# Patient Record
Sex: Female | Born: 1950 | Race: Black or African American | Hispanic: No | Marital: Single | Smoking: Never smoker
Health system: Southern US, Community
[De-identification: ages and names within clinical notes are randomized; demographics above are authoritative.]

## PROBLEM LIST (undated history)

## (undated) DIAGNOSIS — T884XXA Failed or difficult intubation, initial encounter: Secondary | ICD-10-CM

## (undated) DIAGNOSIS — M810 Age-related osteoporosis without current pathological fracture: Secondary | ICD-10-CM

## (undated) DIAGNOSIS — J45909 Unspecified asthma, uncomplicated: Secondary | ICD-10-CM

## (undated) DIAGNOSIS — Z9889 Other specified postprocedural states: Secondary | ICD-10-CM

## (undated) DIAGNOSIS — M1250 Traumatic arthropathy, unspecified site: Secondary | ICD-10-CM

## (undated) DIAGNOSIS — K228 Other specified diseases of esophagus: Secondary | ICD-10-CM

## (undated) DIAGNOSIS — R112 Nausea with vomiting, unspecified: Secondary | ICD-10-CM

## (undated) DIAGNOSIS — L309 Dermatitis, unspecified: Secondary | ICD-10-CM

## (undated) DIAGNOSIS — J449 Chronic obstructive pulmonary disease, unspecified: Secondary | ICD-10-CM

## (undated) DIAGNOSIS — Z973 Presence of spectacles and contact lenses: Secondary | ICD-10-CM

## (undated) DIAGNOSIS — J439 Emphysema, unspecified: Secondary | ICD-10-CM

## (undated) DIAGNOSIS — K449 Diaphragmatic hernia without obstruction or gangrene: Secondary | ICD-10-CM

## (undated) DIAGNOSIS — K219 Gastro-esophageal reflux disease without esophagitis: Secondary | ICD-10-CM

## (undated) DIAGNOSIS — K429 Umbilical hernia without obstruction or gangrene: Secondary | ICD-10-CM

## (undated) DIAGNOSIS — R918 Other nonspecific abnormal finding of lung field: Secondary | ICD-10-CM

## (undated) DIAGNOSIS — F419 Anxiety disorder, unspecified: Secondary | ICD-10-CM

## (undated) HISTORY — PX: COLONOSCOPY: SHX174

## (undated) HISTORY — DX: Gastro-esophageal reflux disease without esophagitis: K21.9

## (undated) HISTORY — DX: Traumatic arthropathy, unspecified site: M12.50

## (undated) HISTORY — PX: COLONOSCOPY WITH ESOPHAGOGASTRODUODENOSCOPY (EGD): SHX5779

## (undated) HISTORY — DX: Other nonspecific abnormal finding of lung field: R91.8

## (undated) HISTORY — DX: Umbilical hernia without obstruction or gangrene: K42.9

## (undated) HISTORY — PX: ABDOMINAL HYSTERECTOMY: SHX81

## (undated) HISTORY — DX: Diaphragmatic hernia without obstruction or gangrene: K44.9

## (undated) HISTORY — DX: Emphysema, unspecified: J43.9

## (undated) HISTORY — PX: OTHER SURGICAL HISTORY: SHX169

## (undated) HISTORY — DX: Dermatitis, unspecified: L30.9

---
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
## 2015-02-27 ENCOUNTER — Emergency Department (HOSPITAL_COMMUNITY): Payer: Non-veteran care

## 2015-02-27 ENCOUNTER — Encounter (HOSPITAL_COMMUNITY): Payer: Self-pay | Admitting: Emergency Medicine

## 2015-02-27 ENCOUNTER — Emergency Department (HOSPITAL_COMMUNITY)
Admission: EM | Admit: 2015-02-27 | Discharge: 2015-02-27 | Disposition: A | Discharge status: Home / Self Care | Payer: Non-veteran care | Attending: Emergency Medicine | Admitting: Emergency Medicine

## 2015-02-27 DIAGNOSIS — J441 Chronic obstructive pulmonary disease with (acute) exacerbation: Secondary | ICD-10-CM | POA: Insufficient documentation

## 2015-02-27 DIAGNOSIS — R0602 Shortness of breath: Secondary | ICD-10-CM | POA: Diagnosis present

## 2015-02-27 HISTORY — DX: Chronic obstructive pulmonary disease, unspecified: J44.9

## 2015-02-27 LAB — CBC
HCT: 41.7 % (ref 36.0–46.0)
HEMOGLOBIN: 14.1 g/dL (ref 12.0–15.0)
MCH: 31.6 pg (ref 26.0–34.0)
MCHC: 33.8 g/dL (ref 30.0–36.0)
MCV: 93.5 fL (ref 78.0–100.0)
PLATELETS: 207 10*3/uL (ref 150–400)
RBC: 4.46 MIL/uL (ref 3.87–5.11)
RDW: 12.2 % (ref 11.5–15.5)
WBC: 4.1 10*3/uL (ref 4.0–10.5)

## 2015-02-27 LAB — BASIC METABOLIC PANEL
ANION GAP: 10 (ref 5–15)
BUN: 8 mg/dL (ref 6–20)
CALCIUM: 9.7 mg/dL (ref 8.9–10.3)
CO2: 28 mmol/L (ref 22–32)
CREATININE: 0.98 mg/dL (ref 0.44–1.00)
Chloride: 105 mmol/L (ref 101–111)
GFR, EST NON AFRICAN AMERICAN: 60 mL/min — AB (ref >60)
Glucose, Bld: 113 mg/dL — ABNORMAL HIGH (ref 65–99)
Potassium: 4.5 mmol/L (ref 3.5–5.1)
SODIUM: 143 mmol/L (ref 135–145)

## 2015-02-27 MED ORDER — ALBUTEROL SULFATE (2.5 MG/3ML) 0.083% IN NEBU
INHALATION_SOLUTION | RESPIRATORY_TRACT | Status: AC
  Administered 2015-02-27: 2.5 mg
  Filled 2015-02-27: qty 6

## 2015-02-27 MED ORDER — IPRATROPIUM BROMIDE 0.02 % IN SOLN
0.5000 mg | Freq: Once | RESPIRATORY_TRACT | Status: AC
  Administered 2015-02-27: 0.5 mg via RESPIRATORY_TRACT
  Filled 2015-02-27: qty 2.5

## 2015-02-27 MED ORDER — PREDNISONE 20 MG PO TABS: 40.0000 mg | ORAL_TABLET | Freq: Every day | ORAL | Status: AC

## 2015-02-27 MED ORDER — IPRATROPIUM-ALBUTEROL 0.5-2.5 (3) MG/3ML IN SOLN
RESPIRATORY_TRACT | Status: AC
  Filled 2015-02-27: qty 3

## 2015-02-27 MED ORDER — ALBUTEROL SULFATE (2.5 MG/3ML) 0.083% IN NEBU: 5.0000 mg | INHALATION_SOLUTION | Freq: Once | RESPIRATORY_TRACT | Status: DC

## 2015-02-27 MED ORDER — AZITHROMYCIN 250 MG PO TABS
500.0000 mg | ORAL_TABLET | Freq: Once | ORAL | Status: DC
  Filled 2015-02-27: qty 2

## 2015-02-27 MED ORDER — PREDNISONE 20 MG PO TABS
50.0000 mg | ORAL_TABLET | Freq: Once | ORAL | Status: DC
  Filled 2015-02-27: qty 3

## 2015-02-27 MED ORDER — AZITHROMYCIN 250 MG PO TABS: 250.0000 mg | ORAL_TABLET | Freq: Every day | ORAL | Status: DC

## 2015-02-27 NOTE — ED Notes (Signed)
Pt ambulated in the hall way. o2 91 heart rate 73

## 2015-02-27 NOTE — Discharge Instructions (Signed)
Chronic Obstructive Pulmonary Disease Chronic obstructive pulmonary disease (COPD) is a common lung condition in which airflow from the lungs is limited. COPD is a general term that can be used to describe many different lung problems that limit airflow, including both chronic bronchitis and emphysema. If you have COPD, your lung function will probably never return to normal, but there are measures you can take to improve lung function and make yourself feel better. CAUSES   Smoking (common).  Exposure to secondhand smoke.  Genetic problems.  Chronic inflammatory lung diseases or recurrent infections. SYMPTOMS  Shortness of breath, especially with physical activity.  Deep, persistent (chronic) cough with a large amount of thick mucus.  Wheezing.  Rapid breaths (tachypnea).  Gray or bluish discoloration (cyanosis) of the skin, especially in your fingers, toes, or lips.  Fatigue.  Weight loss.  Frequent infections or episodes when breathing symptoms become much worse (exacerbations).  Chest tightness. DIAGNOSIS Your health care provider will take a medical history and perform a physical examination to diagnose COPD. Additional tests for COPD may include:  Lung (pulmonary) function tests.  Chest X-ray.  CT scan.  Blood tests. TREATMENT  Treatment for COPD may include:  Inhaler and nebulizer medicines. These help manage the symptoms of COPD and make your breathing more comfortable.  Supplemental oxygen. Supplemental oxygen is only helpful if you have a low oxygen level in your blood.  Exercise and physical activity. These are beneficial for nearly all people with COPD.  Lung surgery or transplant.  Nutrition therapy to gain weight, if you are underweight.  Pulmonary rehabilitation. This may involve working with a team of health care providers and specialists, such as respiratory, occupational, and physical therapists. HOME CARE INSTRUCTIONS  Take all medicines  (inhaled or pills) as directed by your health care provider.  Avoid over-the-counter medicines or cough syrups that dry up your airway (such as antihistamines) and slow down the elimination of secretions unless instructed otherwise by your health care provider.  If you are a smoker, the most important thing that you can do is stop smoking. Continuing to smoke will cause further lung damage and breathing trouble. Ask your health care provider for help with quitting smoking. He or she can direct you to community resources or hospitals that provide support.  Avoid exposure to irritants such as smoke, chemicals, and fumes that aggravate your breathing.  Use oxygen therapy and pulmonary rehabilitation if directed by your health care provider. If you require home oxygen therapy, ask your health care provider whether you should purchase a pulse oximeter to measure your oxygen level at home.  Avoid contact with individuals who have a contagious illness.  Avoid extreme temperature and humidity changes.  Eat healthy foods. Eating smaller, more frequent meals and resting before meals may help you maintain your strength.  Stay active, but balance activity with periods of rest. Exercise and physical activity will help you maintain your ability to do things you want to do.  Preventing infection and hospitalization is very important when you have COPD. Make sure to receive all the vaccines your health care provider recommends, especially the pneumococcal and influenza vaccines. Ask your health care provider whether you need a pneumonia vaccine.  Learn and use relaxation techniques to manage stress.  Learn and use controlled breathing techniques as directed by your health care provider. Controlled breathing techniques include:  Pursed lip breathing. Start by breathing in (inhaling) through your nose for 1 second. Then, purse your lips as if you were   going to whistle and breathe out (exhale) through the  pursed lips for 2 seconds.  Diaphragmatic breathing. Start by putting one hand on your abdomen just above your waist. Inhale slowly through your nose. The hand on your abdomen should move out. Then purse your lips and exhale slowly. You should be able to feel the hand on your abdomen moving in as you exhale.  Learn and use controlled coughing to clear mucus from your lungs. Controlled coughing is a series of short, progressive coughs. The steps of controlled coughing are: 1. Lean your head slightly forward. 2. Breathe in deeply using diaphragmatic breathing. 3. Try to hold your breath for 3 seconds. 4. Keep your mouth slightly open while coughing twice. 5. Spit any mucus out into a tissue. 6. Rest and repeat the steps once or twice as needed. SEEK MEDICAL CARE IF:  You are coughing up more mucus than usual.  There is a change in the color or thickness of your mucus.  Your breathing is more labored than usual.  Your breathing is faster than usual. SEEK IMMEDIATE MEDICAL CARE IF:  You have shortness of breath while you are resting.  You have shortness of breath that prevents you from:  Being able to talk.  Performing your usual physical activities.  You have chest pain lasting longer than 5 minutes.  Your skin color is more cyanotic than usual.  You measure low oxygen saturations for longer than 5 minutes with a pulse oximeter. MAKE SURE YOU:  Understand these instructions.  Will watch your condition.  Will get help right away if you are not doing well or get worse.   This information is not intended to replace advice given to you by your health care provider. Make sure you discuss any questions you have with your health care provider.   Document Released: 11/26/2004 Document Revised: 03/09/2014 Document Reviewed: 10/13/2012 Elsevier Interactive Patient Education 2016 [employer]  

## 2015-02-27 NOTE — ED Notes (Signed)
C/o shortness of breath for about a week-- increasing today- has been coughing up yellowish thick sputum, wearing 02 at night--- pt normally goes to [facility].

## 2015-02-27 NOTE — ED Provider Notes (Signed)
CSN: [id number]     Arrival date & time 02/27/15  1226 History   First MD Initiated Contact with Patient 02/27/15 1725     Chief Complaint  Patient presents with  . Shortness of Breath   64 yo F w/PMH of COPD who presents w/worsening SOB x 1 week and increasing sputum production, yellow. Tried her home inhalers w/no help. SOB worsened last night and this morning and pt had to use her home O2 (which she rarely needs; only used 2L).  She denies CP, hemoptysis, fever, chills, N/V, diarrhea, constipation, hematemesis, dysuria, hematuria, sick contacts, or recent travel.   (Consider location/radiation/quality/duration/timing/severity/associated sxs/prior Treatment) Patient is a 64 y.o. female presenting with shortness of breath.  Shortness of Breath Severity:  Severe Onset quality:  Sudden Timing:  Constant Progression:  Worsening Chronicity:  Recurrent Context: weather changes   Relieved by:  Nothing Ineffective treatments:  Inhaler Associated symptoms: cough and sputum production   Associated symptoms: no abdominal pain, no chest pain, no fever, no headaches, no sore throat and no vomiting     Past Medical History  Diagnosis Date  . COPD (chronic obstructive pulmonary disease) ([place])     pt states due to [place] contracted COPD   Past Surgical History  Procedure Laterality Date  . Bunionectomy    . Abdominal hysterectomy     No family history on file. Social History  Substance Use Topics  . Smoking status: Never Smoker   . Smokeless tobacco: None  . Alcohol Use: No   OB History    No data available     Review of Systems  Constitutional: Negative for fever and chills.  HENT: Negative for congestion and sore throat.   Respiratory: Positive for cough, sputum production and shortness of breath.   Cardiovascular: Negative for chest pain, palpitations and leg swelling.  Gastrointestinal: Negative for nausea, vomiting, abdominal pain, diarrhea, constipation and  abdominal distention.  Genitourinary: Negative for dysuria, frequency, flank pain and decreased urine volume.  Neurological: Negative for dizziness, speech difficulty, light-headedness and headaches.  All other systems reviewed and are negative.     Allergies  Flonase  Home Medications   Prior to Admission medications   Medication Sig Start Date End Date Taking? Authorizing Provider  azithromycin (ZITHROMAX) 250 MG tablet Take 1 tablet (250 mg total) by mouth daily. Take 1 tablet daily for 4 days beginning Thursday. 02/27/15   Sherian Maroon, MD  predniSONE (DELTASONE) 20 MG tablet Take 2 tablets (40 mg total) by mouth daily with breakfast. 02/27/15 03/03/15  Sherian Maroon, MD   BP 117/79 mmHg  Pulse 83  Temp(Src) 99 F (37.2 C) (Oral)  Resp 18  SpO2 94% Physical Exam  Constitutional: She is oriented to person, place, and time. She appears well-developed and well-nourished. No distress.  HENT:  Head: Normocephalic and atraumatic.  Eyes: Pupils are equal, round, and reactive to light.  Neck: Normal range of motion.  Cardiovascular: Normal rate, regular rhythm, normal heart sounds and intact distal pulses.  Exam reveals no gallop and no friction rub.   No murmur heard. Pulmonary/Chest: Effort normal. No respiratory distress. She has wheezes (mild). She has no rales. She exhibits no tenderness.  Increased expiratory phase  Abdominal: Soft. Bowel sounds are normal. She exhibits no distension and no mass. There is no tenderness. There is no rebound and no guarding.  Lymphadenopathy:    She has no cervical adenopathy.  Neurological: She is alert and oriented to person, place, and time.  Skin: Skin is warm and dry. She is not diaphoretic.  Nursing note and vitals reviewed.   ED Course  Procedures (including critical care time) Labs Review Labs Reviewed  BASIC METABOLIC PANEL - Abnormal; Notable for the following:    Glucose, Bld 113 (*)    GFR calc non Af Amer 60 (*)    All other  components within normal limits  CBC    Imaging Review Dg Chest 2 View  02/27/2015  CLINICAL DATA:  Shortness of breath for 1 week EXAM: CHEST - 2 VIEW COMPARISON:  None. FINDINGS: Cardiac shadow is within normal limits. The lungs are hyperinflated consistent with COPD. No focal infiltrate or sizable effusion is noted. No bony abnormality is seen. IMPRESSION: COPD without acute abnormality. Electronically Signed   By: Inez Catalina M.D.   On: 02/27/2015 13:38   I have personally reviewed and evaluated these images and lab results as part of my medical decision-making.   EKG Interpretation None      MDM   Final diagnoses:  COPD exacerbation ([place])   64 yo F w/PMH of COPD who presents w/COPD exacerbation. See HPI for details. On exam, NAD, AFVSS. No resp distress, speaking in full sentences easily. Her discomfort and SOB has improved after duoneb treatment in triage. Nothing on exam or hx to indicate PE, CAD, PTX, PNA. Will treat w/steroids and Zpack. DC home w/FU to PCP. STrict return if worsening sx.   Pt was seen under the supervision of Dr. Oleta Mouse.     Sherian Maroon, MD 02/27/15 [place] Liu, MD 02/28/15 [phone]

## 2015-02-27 NOTE — ED Notes (Signed)
Pt reporting she needs to leave, attempted to administer medications to pt however she refused stating "I cannot take this medication at one time." Pt adamant she could not take the amount of medication. Explained to the patient that these medications were for acute treatment and different than regular dose. Pt still refuses. Pt discharged with paperwork and education about prescriptions.

## 2015-04-19 ENCOUNTER — Ambulatory Visit (INDEPENDENT_AMBULATORY_CARE_PROVIDER_SITE_OTHER): Payer: No Typology Code available for payment source | Admitting: Internal Medicine

## 2015-04-19 ENCOUNTER — Encounter: Payer: Self-pay | Admitting: Internal Medicine

## 2015-04-19 VITALS — BP 114/60 | HR 70 | Ht 66.0 in | Wt 135.0 lb

## 2015-04-19 DIAGNOSIS — J449 Chronic obstructive pulmonary disease, unspecified: Secondary | ICD-10-CM

## 2015-04-19 DIAGNOSIS — F341 Dysthymic disorder: Secondary | ICD-10-CM

## 2015-04-19 DIAGNOSIS — J441 Chronic obstructive pulmonary disease with (acute) exacerbation: Secondary | ICD-10-CM | POA: Insufficient documentation

## 2015-04-19 DIAGNOSIS — R4589 Other symptoms and signs involving emotional state: Secondary | ICD-10-CM | POA: Insufficient documentation

## 2015-04-19 MED ORDER — BUDESONIDE 180 MCG/ACT IN AEPB: 2.0000 | INHALATION_SPRAY | Freq: Two times a day (BID) | RESPIRATORY_TRACT | Status: DC

## 2015-04-19 NOTE — Patient Instructions (Addendum)
ICD-9-CM ICD-10-CM   1. Chronic obstructive pulmonary disease, unspecified COPD type ([place]) 496 J44.9   2. Feeling of sadness 300.4 F34.1    #sadness  - Do not think your sadness is related to your inhalers  #COPD - Currently disease is free of flareup Plan chagne asmanex to pulmicort 2 puff twice daily   - will list asmanex as causing headache Continue stiolto daily Keep up pulmonary function test appointment with Dr. Gwenette Greet at the [facility] on 04/24/15  - Please have them fax [place] the results [phone] Refer pulmonary rehabilitation at [facility] Please consider getting pneumonia vaccine today 04/19/2015  #Follow-up - 3 months or sooner if needed

## 2015-04-19 NOTE — Progress Notes (Signed)
Subjective:    Patient ID: Patricia Johnston, female    DOB: 06-Feb-1951, 65 y.o.   MRN: [id number] PCP No PCP Per Patient  HPI  IOV 04/19/2015  Chief Complaint  Patient presents with  . Pulmonary Consult    Pt referred by [facility] for COPD. Pt c/o DOE x several years & c/o prod cough with yellow mucus x 2 weeks. Pt c/o chest tightness when SOB.    65 year old African-American female former Counselling psychologist in the [employer]. Reports no smoking history and previously well till the year 2000 when she was sent to a camp in [place] which was condemned. There she spent 4 nights. The environment was extremely dirty with standing water and mold and significant dust and musty smell. They're asked to clean the carpets and the area. She said that the condition a cold air was extremely foul-smelling and was blowing directly on her. Within a few hours or immediately she started coughing. Symptoms got worse over the next few days. She was then placed in a hotel. Subsequently when she returned to her home in [place] she was diagnosed with asthma. Since then she's been on asthma management. She says that she has had approximately 10 pulmonary function test since then and her numbers range less than 70%. She never normalizes her numbers with treatment. She thinks she's been on many prednisone courses possibly. Then in the year 2010 diagnosis was changed to COPD. A few months ago she relocated to [place] [facility] to live a retired life with her sister. She has now registered herself at the [facility] in [place] with Dr. Cy Blamer use to be pulmonary physician here in this practice] she is due for a pulmonary function test 04/24/2015. She is now here because she wants to be referred to pulmonary rehabilitation. She reports that the [facility] cannot refer her directly. She wants to keep cool pulmonologist 1 inside the [place] in one outside the [place].  It appears she is on triple inhaler therapy with Asmanex and Stiolto given to her by Dr.  Lupita Leash. She feels Asmanex is causing a headache. She's been on this for 2 weeks. In addition she is reporting sadness after starting the inhalers. She thinks inhalers are causing her sadness. But is no suicidal tendency.  COP CAT Score 38 and HIGH symptom burden   Chest x-ray 02/19/2015: Hyperinflated without any abnormalities. I personally visualized this chest x-ray  Blood work 02/19/2015 normal creat 0.98 mg percent and hemoglobin 14.1 g percent.    CAT COPD Symptom & Quality of Life Score (GS[employer] trademark) 0 is no burden. 5 is highest burden 04/19/2015   Never Cough -> Cough all the time 5  No phlegm in chest -> Chest is full of phlegm 5  No chest tightness -> Chest feels very tight 4  No dyspnea for 1 flight stairs/hill -> Very dyspneic for 1 flight of stairs 5  No limitations for ADL at home -> Very limited with ADL at home 5  Confident leaving home -> Not at all confident leaving home 5  Sleep soundly -> Do not sleep soundly because of lung condition 5  Lots of Energy -> No energy at all 4  TOTAL Score (max 40)  38       has a past medical history of COPD (chronic obstructive pulmonary disease) ([place]).   reports that she has never smoked. She has never used smokeless tobacco.  Past Surgical History  Procedure Laterality Date  . Bunionectomy    .  Abdominal hysterectomy      Allergies  Allergen Reactions  . Flonase [Fluticasone Propionate] Other (See Comments)    Nasal bleeding  . Nasacort [Triamcinolone] Other (See Comments)    Nasal bleeding    Immunization History  Administered Date(s) Administered  . Influenza,inj,Quad PF,36+ Mos 11/01/2014    No family history on file.   Current outpatient prescriptions:  .  albuterol (PROVENTIL HFA;VENTOLIN HFA) 108 (90 Base) MCG/ACT inhaler, Inhale 1-2 puffs into the lungs every 6 (six) hours as needed for wheezing or shortness of breath., Disp: , Rfl:  .  ipratropium-albuterol (DUONEB) 0.5-2.5 (3) MG/3ML SOLN, Take 3 mLs by  nebulization every 6 (six) hours as needed., Disp: , Rfl:  .  mometasone (ASMANEX) 220 MCG/INH inhaler, Inhale 2 puffs into the lungs daily., Disp: , Rfl:  .  OXYGEN, Inhale 2 L into the lungs daily as needed (for exertion). Reported on 04/19/2015, Disp: , Rfl:  .  Tiotropium Bromide-Olodaterol (STIOLTO RESPIMAT) 2.5-2.5 MCG/ACT AERS, Inhale 2 puffs into the lungs daily., Disp: , Rfl:       Review of Systems  Constitutional: Negative for fever and unexpected weight change.  HENT: Negative for congestion, dental problem, ear pain, nosebleeds, postnasal drip, rhinorrhea, sinus pressure, sneezing, sore throat and trouble swallowing.   Eyes: Negative for redness and itching.  Respiratory: Positive for cough and shortness of breath. Negative for chest tightness and wheezing.   Cardiovascular: Negative for palpitations and leg swelling.  Gastrointestinal: Negative for nausea and vomiting.  Genitourinary: Negative for dysuria.  Musculoskeletal: Negative for joint swelling.  Skin: Negative for rash.  Neurological: Negative for headaches.  Hematological: Does not bruise/bleed easily.  Psychiatric/Behavioral: Negative for dysphoric mood. The patient is not nervous/anxious.        Objective:   Physical Exam  Constitutional: She is oriented to person, place, and time. She appears well-developed and well-nourished. No distress.  HENT:  Head: Normocephalic and atraumatic.  Right Ear: External ear normal.  Left Ear: External ear normal.  Mouth/Throat: Oropharynx is clear and moist. No oropharyngeal exudate.  Eyes: Conjunctivae and EOM are normal. Pupils are equal, round, and reactive to light. Right eye exhibits no discharge. Left eye exhibits no discharge. No scleral icterus.  Neck: Normal range of motion. Neck supple. No JVD present. No tracheal deviation present. No thyromegaly present.  Cardiovascular: Normal rate, regular rhythm, normal heart sounds and intact distal pulses.  Exam reveals no  gallop and no friction rub.   No murmur heard. Pulmonary/Chest: Effort normal and breath sounds normal. No respiratory distress. She has no wheezes. She has no rales. She exhibits no tenderness.  Overall dimiished air entry  Abdominal: Soft. Bowel sounds are normal. She exhibits no distension and no mass. There is no tenderness. There is no rebound and no guarding.  Musculoskeletal: Normal range of motion. She exhibits no edema or tenderness.  Lymphadenopathy:    She has no cervical adenopathy.  Neurological: She is alert and oriented to person, place, and time. She has normal reflexes. No cranial nerve deficit. She exhibits normal muscle tone. Coordination normal.  Skin: Skin is warm and dry. No rash noted. She is not diaphoretic. No erythema. No pallor.  Psychiatric: Judgment and thought content normal.  Flat affct  Vitals reviewed.   Filed Vitals:   04/19/15 1126  BP: 114/60  Pulse: 70  Height: 5\' 6"  (1.676 m)  Weight: 135 lb (61.236 kg)  SpO2: 91%         Assessment & Plan:  ICD-9-CM ICD-10-CM   1. Chronic obstructive pulmonary disease, unspecified COPD type ([place]) 496 J44.9   2. Feeling of sadness 300.4 F34.1      #sadness  - Do not think your sadness is related to your inhalers  #COPD - Currently disease is free of flareup Plan chagne asmanex to pulmicort 2 puff twice daily   - will list asmanex as causing headache Continue stiolto daily Keep up pulmonary function test appointment with Dr. Gwenette Greet at the [facility] on 04/24/15  - Please have them fax [place] the results [phone] Refer pulmonary rehabilitation at [facility] Please consider getting pneumonia vaccine today 04/19/2015  #Follow-up - 3 months or sooner if needed   Dr. Brand Males, M.D., [facility].C.P Pulmonary and Critical Care Medicine Staff Physician [facility] Pager: [phone], If no answer or between  15:00h - 7:00h: call [phone]   [phone]  04/19/2015 12:04 PM

## 2015-04-24 LAB — PULMONARY FUNCTION TEST

## 2015-07-18 ENCOUNTER — Ambulatory Visit (INDEPENDENT_AMBULATORY_CARE_PROVIDER_SITE_OTHER): Payer: No Typology Code available for payment source | Admitting: Internal Medicine

## 2015-07-18 ENCOUNTER — Encounter: Payer: Self-pay | Admitting: Internal Medicine

## 2015-07-18 VITALS — BP 108/62 | HR 61 | Ht 66.0 in | Wt 128.0 lb

## 2015-07-18 DIAGNOSIS — J449 Chronic obstructive pulmonary disease, unspecified: Secondary | ICD-10-CM | POA: Diagnosis not present

## 2015-07-18 MED ORDER — DOXYCYCLINE HYCLATE 100 MG PO TABS: 100.0000 mg | ORAL_TABLET | Freq: Two times a day (BID) | ORAL | Status: AC

## 2015-07-18 NOTE — Progress Notes (Signed)
Subjective:     Patient ID: Patricia Johnston, female   DOB: [birth year], 65 y.o.   MRN: [id number]  HPI PCP No PCP Per Patient  HPI  IOV 04/19/2015  Chief Complaint  Patient presents with  . Pulmonary Consult    Pt referred by [facility] for COPD. Pt c/o DOE x several years & c/o prod cough with yellow mucus x 2 weeks. Pt c/o chest tightness when SOB.    65 year old African-American female former Counselling psychologist in the [employer]. Reports no smoking history and previously well till the year 2000 when she was sent to a camp in [place] which was condemned. There she spent 4 nights. The environment was extremely dirty with standing water and mold and significant dust and musty smell. They're asked to clean the carpets and the area. She said that the condition a cold air was extremely foul-smelling and was blowing directly on her. Within a few hours or immediately she started coughing. Symptoms got worse over the next few days. She was then placed in a hotel. Subsequently when she returned to her home in [place] she was diagnosed with asthma. Since then she's been on asthma management. She says that she has had approximately 10 pulmonary function test since then and her numbers range less than 70%. She never normalizes her numbers with treatment. She thinks she's been on many prednisone courses possibly. Then in the year 2010 diagnosis was changed to COPD. A few months ago she relocated to [facility] to live a retired life with her sister. She has now registered herself at the [facility] in [place] with Dr. Cy Blamer use to be pulmonary physician here in this practice] she is due for a pulmonary function test 04/24/2015. She is now here because she wants to be referred to pulmonary rehabilitation. She reports that the [facility] cannot refer her directly. She wants to keep cool pulmonologist 1 inside the [place] in one outside the [place].  It appears she is on triple inhaler therapy with Asmanex and Stiolto given to her by  Dr. Lupita Leash. She feels Asmanex is causing a headache. She's been on this for 2 weeks. In addition she is reporting sadness after starting the inhalers. She thinks inhalers are causing her sadness. But is no suicidal tendency.  COP CAT Score 38 and HIGH symptom burden   Chest x-ray 02/19/2015: Hyperinflated without any abnormalities. I personally visualized this chest x-ray  Blood work 02/19/2015 normal creat 0.98 mg percent and hemoglobin 14.1 g percent.   OV  07/18/2015  Chief Complaint  Patient presents with  . Follow-up    Pt c/o weight loss >7lbs in 3 mos. Pt c/o some wheeze and SOB over the past few days. She has started prednisone and has been taking Mucinex. Pt denies cough/CP/tightness. Pt never got referral to [facility].    COPD follow-up in this vet  Last visit was in February 2017. After that she went to the [facility] in [place] and apparently she had PFT that showed 50% lung function. She apparently had a CT chest that was clear. She has a 7 pound unexplained weight loss despite good appetite. She is going to talk to this about with primary care physician. She is yet to start pulmonary rehabilitation and she is frustrated that we have not coordinated with the [facility] in ensuring pulmonary rehabilitation Center. We do not have the PFT records from the [facility] as of yet. Otherwise for the last 3 days she's having increased yellow phlegm but no  increase in wheezing or shortness of breath. She is taking Mucinex and this is helping. Yellow phlegm is new. It is mild amount there is no associated fever or worsening cough.    CAT COPD Symptom & Quality of Life Score ([employer] trademark) 0 is no burden. 5 is highest burden 04/19/2015   Never Cough -> Cough all the time 5  No phlegm in chest -> Chest is full of phlegm 5  No chest tightness -> Chest feels very tight 4  No dyspnea for 1 flight stairs/hill -> Very dyspneic for 1 flight of stairs 5  No limitations for ADL at home -> Very limited with ADL at  home 5  Confident leaving home -> Not at all confident leaving home 5  Sleep soundly -> Do not sleep soundly because of lung condition 5  Lots of Energy -> No energy at all 4  TOTAL Score (max 40)  38     has a past medical history of COPD (chronic obstructive pulmonary disease) ([place]).   reports that she has never smoked. She has never used smokeless tobacco.  Past Surgical History  Procedure Laterality Date  . Bunionectomy    . Abdominal hysterectomy      Allergies  Allergen Reactions  . Flonase [Fluticasone Propionate] Other (See Comments)    Nasal bleeding  . Nasacort [Triamcinolone] Other (See Comments)    Nasal bleeding    Immunization History  Administered Date(s) Administered  . Influenza,inj,Quad PF,36+ Mos 11/01/2014    No family history on file.   Current outpatient prescriptions:  .  albuterol (PROVENTIL HFA;VENTOLIN HFA) 108 (90 Base) MCG/ACT inhaler, Inhale 1-2 puffs into the lungs every 6 (six) hours as needed for wheezing or shortness of breath., Disp: , Rfl:  .  Dextromethorphan-Guaifenesin 5-100 MG/5ML LIQD, Take 5 mLs by mouth every 4 (four) hours as needed., Disp: , Rfl:  .  ipratropium-albuterol (DUONEB) 0.5-2.5 (3) MG/3ML SOLN, Take 3 mLs by nebulization every 6 (six) hours as needed., Disp: , Rfl:  .  mometasone (ASMANEX) 220 MCG/INH inhaler, Inhale 2 puffs into the lungs daily., Disp: , Rfl:  .  OXYGEN, Inhale 2 L into the lungs daily as needed (for exertion). Reported on 04/19/2015, Disp: , Rfl:  .  predniSONE (DELTASONE) 10 MG tablet, Take 10 mg by mouth 2 (two) times daily with a meal., Disp: , Rfl:  .  Tiotropium Bromide-Olodaterol (STIOLTO RESPIMAT) 2.5-2.5 MCG/ACT AERS, Inhale 2 puffs into the lungs daily., Disp: , Rfl:       Review of Systems     Objective:   Physical Exam  Constitutional: She is oriented to person, place, and time. She appears well-developed and well-nourished. No distress.  HENT:  Head: Normocephalic and atraumatic.   Right Ear: External ear normal.  Left Ear: External ear normal.  Mouth/Throat: Oropharynx is clear and moist. No oropharyngeal exudate.  Eyes: Conjunctivae and EOM are normal. Pupils are equal, round, and reactive to light. Right eye exhibits no discharge. Left eye exhibits no discharge. No scleral icterus.  Neck: Normal range of motion. Neck supple. No JVD present. No tracheal deviation present. No thyromegaly present.  Cardiovascular: Normal rate, regular rhythm, normal heart sounds and intact distal pulses.  Exam reveals no gallop and no friction rub.   No murmur heard. Pulmonary/Chest: Effort normal and breath sounds normal. No respiratory distress. She has no wheezes. She has no rales. She exhibits no tenderness.  Abdominal: Soft. Bowel sounds are normal. She exhibits no distension and no  mass. There is no tenderness. There is no rebound and no guarding.  Musculoskeletal: Normal range of motion. She exhibits no edema or tenderness.  Lymphadenopathy:    She has no cervical adenopathy.  Neurological: She is alert and oriented to person, place, and time. She has normal reflexes. No cranial nerve deficit. She exhibits normal muscle tone. Coordination normal.  Skin: Skin is warm and dry. No rash noted. She is not diaphoretic. No erythema. No pallor.  Psychiatric: She has a normal mood and affect. Her behavior is normal. Judgment and thought content normal.  Mild flat afect  Vitals reviewed.   Filed Vitals:   07/18/15 1012  BP: 108/62  Pulse: 61  Height: 5\' 6"  (1.676 m)  Weight: 128 lb (58.06 kg)  SpO2: 94%        Assessment:       ICD-9-CM ICD-10-CM   1. Chronic obstructive pulmonary disease, unspecified COPD type ([place]) 496 J44.9        Plan:       #COPD - Currently mild  flareup - unclear reason for weight loss - talk to PCP   Plan Take doxycycline 100mg  po twice daily x 5 days; take after meals and avoid sunlight IF worse call us for prednisone Continue COPD  maintenance treatment as before Refer pulmonary rehab Sign release to get ct chest result and pft result from [facility]   #Follow-up - 3 months or sooner if needed  - COPD CAT score at followup

## 2015-07-18 NOTE — Patient Instructions (Addendum)
ICD-9-CM ICD-10-CM   1. Chronic obstructive pulmonary disease, unspecified COPD type ([place]) 496 J44.9      #COPD - Currently mild  flareup - unclear reason for weight loss - talk to PCP   Plan Take doxycycline 100mg  po twice daily x 5 days; take after meals and avoid sunlight IF worse call us for prednisone Continue COPD maintenance treatment as before Refer pulmonary rehab Sign release to get ct chest result and pft result from [facility]   #Follow-up - 3 months or sooner if needed  - COPD CAT score at followup

## 2015-07-18 NOTE — Addendum Note (Signed)
Addended by: Wynn Banker H on: 07/18/2015 11:01 AM   Modules accepted: Orders

## 2015-08-09 ENCOUNTER — Telehealth: Payer: Self-pay | Admitting: Internal Medicine

## 2015-08-09 NOTE — Telephone Encounter (Signed)
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 He had referred her to allergy. ? ABPA. At [facility] 10/22/15 I will re-review [facility] notes and discuss with her chronic pred +/- itraconazole v nucalan

## 2015-09-27 ENCOUNTER — Encounter (HOSPITAL_COMMUNITY): Payer: Self-pay

## 2015-09-27 ENCOUNTER — Encounter (HOSPITAL_COMMUNITY)
Admission: RE | Admit: 2015-09-27 | Discharge: 2015-09-27 | Disposition: A | Discharge status: Home / Self Care | Payer: No Typology Code available for payment source | Source: Ambulatory Visit | Attending: Pulmonary Disease | Admitting: Pulmonary Disease

## 2015-09-27 VITALS — BP 112/64 | HR 77 | Resp 18 | Ht 65.0 in | Wt 127.9 lb

## 2015-09-27 DIAGNOSIS — J449 Chronic obstructive pulmonary disease, unspecified: Secondary | ICD-10-CM

## 2015-09-27 HISTORY — DX: Age-related osteoporosis without current pathological fracture: M81.0

## 2015-09-27 HISTORY — DX: Unspecified asthma, uncomplicated: J45.909

## 2015-09-27 NOTE — Progress Notes (Signed)
Patricia Johnston 65 y.o. female Pulmonary Rehab Orientation Note Patient arrived today in Cardiac and Pulmonary Rehab for orientation to Pulmonary Rehab. He was transported from [employer] via wheel chair. She does not carry portable oxygen, although it has been prescribed as needed. She does not check her oxygen saturations, but will request pulse ox from [place]. Per pt, she uses oxygen occasionally with her COPD/Asthma flares.The last time she used her oxygen at 2 liters/min was 2 weeks ago with a taper dose of steroids. Color good, skin warm and dry. Patient is oriented to time and place. Patient's medical history, psychosocial health, and medications reviewed. Psychosocial assessment reveals pt lives alone. She was widowed years ago. She received an honorable discharge from the [employer] in 2000. Pt is currently disabled. Pt hobbies include reading, gardening, being active with her church. She also enjoys bowling, but has not done so since she has been sick. Pt reports her stress level is low. She contributes this to her faith in God. Pt does not exhibits signs of depression. PHQ2/9 score 0/NA. Pt shows good  coping skills with positive outlook . She was offered emotional support and reassurance. Will continue to monitor and evaluate progress toward psychosocial goal(s) of remaining positive about her future. Physical assessment reveals heart rate is normal, breath sounds clear to auscultation, no wheezes, rales, or rhonchi. Grip strength equal, strong. Distal pulses palpable, no peripheral edema. Patient reports she does not take medications as prescribed. Stressed importance of using daily inhalers as prescribed and not as needed. Patient states she follows a Regular diet. The patient reports no specific efforts to gain or lose weight. She describes a continuous 2 lb unexplained weight loss monthly. Patient's weight will be monitored closely. Demonstration and practice of PLB using pulse oximeter. Patient able to  return demonstration satisfactorily. Safety and hand hygiene in the exercise area reviewed with patient. Patient voices understanding of the information reviewed. Department expectations discussed with patient and achievable goals were set. The patient shows enthusiasm about attending the program and we look forward to working with this nice lady. The patient is scheduled for a 6 min walk test on Tuesday 10/01/15 and to begin exercise on Tuesday 10/08/15 in the 1:30 class.

## 2015-10-01 ENCOUNTER — Encounter (HOSPITAL_COMMUNITY)
Admission: RE | Admit: 2015-10-01 | Discharge: 2015-10-01 | Disposition: A | Discharge status: Home / Self Care | Payer: Non-veteran care | Source: Ambulatory Visit | Attending: Pulmonary Disease | Admitting: Pulmonary Disease

## 2015-10-01 DIAGNOSIS — Z7951 Long term (current) use of inhaled steroids: Secondary | ICD-10-CM | POA: Insufficient documentation

## 2015-10-01 DIAGNOSIS — J449 Chronic obstructive pulmonary disease, unspecified: Secondary | ICD-10-CM | POA: Insufficient documentation

## 2015-10-01 DIAGNOSIS — M810 Age-related osteoporosis without current pathological fracture: Secondary | ICD-10-CM | POA: Insufficient documentation

## 2015-10-01 DIAGNOSIS — Z79899 Other long term (current) drug therapy: Secondary | ICD-10-CM | POA: Insufficient documentation

## 2015-10-01 NOTE — Progress Notes (Signed)
Pulmonary Individual Treatment Plan  Patient Details  Name: Evalise Delly MRN: [id number] Date of Birth: [birth year] Referring Provider:   April Manson Pulmonary Rehab Walk Test from 10/01/2015 in [place]  Referring Provider  Dr. Nelda Marseille      Initial Encounter Date:  Flowsheet Row Pulmonary Rehab Walk Test from 10/01/2015 in [place]  Date  10/01/15  Referring Provider  Dr. Nelda Marseille      Visit Diagnosis: Chronic obstructive pulmonary disease, unspecified COPD type ([place])  Patient's Home Medications on Admission:   Current Outpatient Prescriptions:  .  albuterol (PROVENTIL HFA;VENTOLIN HFA) 108 (90 Base) MCG/ACT inhaler, Inhale 1-2 puffs into the lungs every 6 (six) hours as needed for wheezing or shortness of breath., Disp: , Rfl:  .  Dextromethorphan-Guaifenesin 5-100 MG/5ML LIQD, Take 5 mLs by mouth every 4 (four) hours as needed., Disp: , Rfl:  .  ipratropium-albuterol (DUONEB) 0.5-2.5 (3) MG/3ML SOLN, Take 3 mLs by nebulization every 6 (six) hours as needed., Disp: , Rfl:  .  mometasone (ASMANEX) 220 MCG/INH inhaler, Inhale 2 puffs into the lungs daily., Disp: , Rfl:  .  OXYGEN, Inhale 2 L into the lungs daily as needed (for exertion). Reported on 04/19/2015, Disp: , Rfl:  .  predniSONE (DELTASONE) 10 MG tablet, Take 10 mg by mouth 2 (two) times daily with a meal. Takes as needed per [place], Disp: , Rfl:  .  Tiotropium Bromide-Olodaterol (STIOLTO RESPIMAT) 2.5-2.5 MCG/ACT AERS, Inhale 2 puffs into the lungs daily., Disp: , Rfl:   Past Medical History: Past Medical History:  Diagnosis Date  . Asthma   . COPD (chronic obstructive pulmonary disease) ([place])    pt states due to [place] contracted COPD  . Osteoporosis     Tobacco Use: History  Smoking Status  . Never Smoker  Smokeless Tobacco  . Never Used    Labs: Recent Review Flowsheet Data    There is no flowsheet data to display.      Capillary  Blood Glucose: No results found for: GLUCAP   ADL UCSD:   Pulmonary Function Assessment:     Pulmonary Function Assessment - 09/27/15 1117      Breath   Bilateral Breath Sounds Clear   Shortness of Breath Yes;Limiting activity;Panic with Shortness of Breath      Exercise Target Goals: Date: 10/01/15  Exercise Program Goal: Individual exercise prescription set with THRR, safety & activity barriers. Participant demonstrates ability to understand and report RPE using BORG scale, to self-measure pulse accurately, and to acknowledge the importance of the exercise prescription.  Exercise Prescription Goal: Starting with aerobic activity 30 plus minutes a day, 3 days per week for initial exercise prescription. Provide home exercise prescription and guidelines that participant acknowledges understanding prior to discharge.  Activity Barriers & Risk Stratification:     Activity Barriers & Cardiac Risk Stratification - 09/27/15 1116      Activity Barriers & Cardiac Risk Stratification   Activity Barriers Deconditioning;Arthritis;Shortness of Breath      6 Minute Walk:     6 Minute Walk    Row Name 10/01/15 1631         6 Minute Walk   Phase Initial     Distance 1460 feet     Walk Time 6 minutes     # of Rest Breaks 0     MPH 2.76     METS 3.07     RPE 11  Perceived Dyspnea  1     Symptoms No     Resting HR 92 bpm     Resting BP 100/62     Max Ex. HR 97 bpm     Max Ex. BP 104/60     2 Minute Post BP 104/60       Interval HR   Baseline HR 92     1 Minute HR 95     2 Minute HR 90     3 Minute HR 97     4 Minute HR 93     5 Minute HR 91     6 Minute HR 93     2 Minute Post HR 88     Interval Heart Rate? Yes       Interval Oxygen   Interval Oxygen? Yes     Baseline Oxygen Saturation % 92 %     Baseline Liters of Oxygen 0 L     1 Minute Oxygen Saturation % 88 %     1 Minute Liters of Oxygen 0 L     2 Minute Oxygen Saturation % 88 %     2 Minute Liters  of Oxygen 0 L     3 Minute Oxygen Saturation % 91 %     3 Minute Liters of Oxygen 0 L     4 Minute Oxygen Saturation % 92 %     4 Minute Liters of Oxygen 0 L     5 Minute Oxygen Saturation % 91 %     5 Minute Liters of Oxygen 0 L     6 Minute Oxygen Saturation % 94 %     6 Minute Liters of Oxygen 0 L     2 Minute Post Oxygen Saturation % 92 %     2 Minute Post Liters of Oxygen 0 L        Initial Exercise Prescription:     Initial Exercise Prescription - 10/01/15 1600      Date of Initial Exercise RX and Referring Provider   Date 10/01/15   Referring Provider Dr. Nelda Marseille     NuStep   Level 2   Minutes 17   METs 1.7     Rower   Level 1   Minutes 17     Track   Laps 8   Minutes 17     Prescription Details   Frequency (times per week) 2   Duration Progress to 45 minutes of aerobic exercise without signs/symptoms of physical distress     Intensity   THRR 40-80% of Max Heartrate 62-124   Ratings of Perceived Exertion 11-13   Perceived Dyspnea 0-4     Progression   Progression Continue progressive overload as per policy without signs/symptoms or physical distress.     Resistance Training   Training Prescription Yes   Weight orange bands   Reps 10-12      Perform Capillary Blood Glucose checks as needed.  Exercise Prescription Changes:   Exercise Comments:   Discharge Exercise Prescription (Final Exercise Prescription Changes):    Nutrition:  Target Goals: Understanding of nutrition guidelines, daily intake of sodium 1500mg , cholesterol 200mg , calories 30% from fat and 7% or less from saturated fats, daily to have 5 or more servings of fruits and vegetables.  Biometrics:     Pre Biometrics - 09/27/15 1118      Pre Biometrics   Grip Strength 39 kg       Nutrition Therapy Plan and  Nutrition Goals:   Nutrition Discharge: Rate Your Plate Scores:   Psychosocial: Target Goals: Acknowledge presence or absence of depression, maximize coping  skills, provide positive support system. Participant is able to verbalize types and ability to use techniques and skills needed for reducing stress and depression.  Initial Review & Psychosocial Screening:     Initial Psych Review & Screening - 09/27/15 [place]? Yes     Barriers   Psychosocial barriers to participate in program There are no identifiable barriers or psychosocial needs.     Screening Interventions   Interventions Encouraged to exercise      Quality of Life Scores:   PHQ-9: Recent Review Flowsheet Data    Depression screen [name] 2/9 09/27/2015   Decreased Interest 0   Down, Depressed, Hopeless 0   PHQ - 2 Score 0      Psychosocial Evaluation and Intervention:     Psychosocial Evaluation - 09/27/15 1122      Psychosocial Evaluation & Interventions   Interventions Encouraged to exercise with the program and follow exercise prescription      Psychosocial Re-Evaluation:  Education: Education Goals: Education classes will be provided on a weekly basis, covering required topics. Participant will state understanding/return demonstration of topics presented.  Learning Barriers/Preferences:     Learning Barriers/Preferences - 09/27/15 1116      Learning Barriers/Preferences   Learning Barriers None   Learning Preferences Written Material;Computer/Internet;Verbal Instruction;Audio      Education Topics: Risk Factor Reduction:  -Group instruction that is supported by a PowerPoint presentation. Instructor discusses the definition of a risk factor, different risk factors for pulmonary disease, and how the heart and lungs work together.     Nutrition for Pulmonary Patient:  -Group instruction provided by PowerPoint slides, verbal discussion, and written materials to support subject matter. The instructor gives an explanation and review of healthy diet recommendations, which includes a discussion on weight management,  recommendations for fruit and vegetable consumption, as well as protein, fluid, caffeine, fiber, sodium, sugar, and alcohol. Tips for eating when patients are short of breath are discussed.   Pursed Lip Breathing:  -Group instruction that is supported by demonstration and informational handouts. Instructor discusses the benefits of pursed lip and diaphragmatic breathing and detailed demonstration on how to preform both.     Oxygen Safety:  -Group instruction provided by PowerPoint, verbal discussion, and written material to support subject matter. There is an overview of "What is Oxygen" and "Why do we need it".  Instructor also reviews how to create a safe environment for oxygen use, the importance of using oxygen as prescribed, and the risks of noncompliance. There is a brief discussion on traveling with oxygen and resources the patient may utilize.   Oxygen Equipment:  -Group instruction provided by [facility] Staff utilizing handouts, written materials, and equipment demonstrations.   Signs and Symptoms:  -Group instruction provided by written material and verbal discussion to support subject matter. Warning signs and symptoms of infection, stroke, and heart attack are reviewed and when to call the physician/911 reinforced. Tips for preventing the spread of infection discussed.   Advanced Directives:  -Group instruction provided by verbal instruction and written material to support subject matter. Instructor reviews Advanced Directive laws and proper instruction for filling out document.   Pulmonary Video:  -Group video education that reviews the importance of medication and oxygen compliance, exercise, good nutrition, pulmonary hygiene, and pursed lip and diaphragmatic  breathing for the pulmonary patient.   Exercise for the Pulmonary Patient:  -Group instruction that is supported by a PowerPoint presentation. Instructor discusses benefits of exercise, core components of exercise,  frequency, duration, and intensity of an exercise routine, importance of utilizing pulse oximetry during exercise, safety while exercising, and options of places to exercise outside of rehab.     Pulmonary Medications:  -Verbally interactive group education provided by instructor with focus on inhaled medications and proper administration.   Anatomy and Physiology of the Respiratory System and Intimacy:  -Group instruction provided by PowerPoint, verbal discussion, and written material to support subject matter. Instructor reviews respiratory cycle and anatomical components of the respiratory system and their functions. Instructor also reviews differences in obstructive and restrictive respiratory diseases with examples of each. Intimacy, Sex, and Sexuality differences are reviewed with a discussion on how relationships can change when diagnosed with pulmonary disease. Common sexual concerns are reviewed.   Knowledge Questionnaire Score:   Core Components/Risk Factors/Patient Goals at Admission:     Personal Goals and Risk Factors at Admission - 09/27/15 1120      Core Components/Risk Factors/Patient Goals on Admission   Increase Strength and Stamina Yes   Intervention Provide advice, education, support and counseling about physical activity/exercise needs.;Develop an individualized exercise prescription for aerobic and resistive training based on initial evaluation findings, risk stratification, comorbidities and participant's personal goals.   Expected Outcomes Achievement of increased cardiorespiratory fitness and enhanced flexibility, muscular endurance and strength shown through measurements of functional capacity and personal statement of participant.   Improve shortness of breath with ADL's Yes   Intervention Provide education, individualized exercise plan and daily activity instruction to help decrease symptoms of SOB with activities of daily living.   Expected Outcomes Short Term:  Achieves a reduction of symptoms when performing activities of daily living.   Develop more efficient breathing techniques such as purse lipped breathing and diaphragmatic breathing; and practicing self-pacing with activity Yes   Intervention Provide education, demonstration and support about specific breathing techniuqes utilized for more efficient breathing. Include techniques such as pursed lipped breathing, diaphragmatic breathing and self-pacing activity.   Expected Outcomes Short Term: Participant will be able to demonstrate and use breathing techniques as needed throughout daily activities.   Increase knowledge of respiratory medications and ability to use respiratory devices properly  Yes   Intervention Provide education and demonstration as needed of appropriate use of medications, inhalers, and oxygen therapy.   Expected Outcomes Short Term: Achieves understanding of medications use. Understands that oxygen is a medication prescribed by physician. Demonstrates appropriate use of inhaler and oxygen therapy.      Core Components/Risk Factors/Patient Goals Review:    Core Components/Risk Factors/Patient Goals at Discharge (Final Review):    ITP Comments:   Comments:

## 2015-10-02 ENCOUNTER — Encounter (HOSPITAL_COMMUNITY): Payer: Self-pay | Admitting: *Deleted

## 2015-10-08 ENCOUNTER — Encounter (HOSPITAL_COMMUNITY)
Admission: RE | Admit: 2015-10-08 | Discharge: 2015-10-08 | Disposition: A | Discharge status: Home / Self Care | Payer: Non-veteran care | Source: Ambulatory Visit | Attending: Pulmonary Disease | Admitting: Pulmonary Disease

## 2015-10-08 VITALS — Wt 125.2 lb

## 2015-10-08 DIAGNOSIS — J449 Chronic obstructive pulmonary disease, unspecified: Secondary | ICD-10-CM

## 2015-10-08 DIAGNOSIS — Z7951 Long term (current) use of inhaled steroids: Secondary | ICD-10-CM | POA: Diagnosis not present

## 2015-10-08 DIAGNOSIS — Z79899 Other long term (current) drug therapy: Secondary | ICD-10-CM | POA: Diagnosis not present

## 2015-10-08 DIAGNOSIS — M810 Age-related osteoporosis without current pathological fracture: Secondary | ICD-10-CM | POA: Diagnosis not present

## 2015-10-08 NOTE — Progress Notes (Signed)
Daily Session Note  Patient Details  Name: Patricia Johnston MRN: [id number] Date of Birth: 1950-08-17 Referring Provider:   April Manson Pulmonary Rehab Walk Test from 10/01/2015 in [place]  Referring Provider  Dr. Nelda Marseille      Encounter Date: 10/08/2015  Check In:     Session Check In - 10/08/15 1337      Check-In   Location [facility]   Staff Present Rosebud Poles, RN, BSN;Sonya Pucci Ysidro Evert, Felipe Drone, RN, MHA;Molly diVincenzo, MS, ACSM RCEP, Exercise Physiologist   Supervising physician immediately available to respond to emergencies [facility] immediately available   Physician(s) Dr. Marily Memos   Medication changes reported     No   Fall or balance concerns reported    No   Warm-up and Cool-down Performed as group-led instruction   Resistance Training Performed Yes   VAD Patient? No     Pain Assessment   Currently in Pain? No/denies   Multiple Pain Sites No      Capillary Blood Glucose: No results found for this or any previous visit (from the past 24 hour(s)).      Exercise Prescription Changes - 10/08/15 1600      Response to Exercise   Blood Pressure (Admit) 104/60   Blood Pressure (Exercise) 98/60   Blood Pressure (Exit) 100/60   Heart Rate (Admit) 79 bpm   Heart Rate (Exercise) 109 bpm   Heart Rate (Exit) 90 bpm   Oxygen Saturation (Admit) 91 %   Oxygen Saturation (Exercise) 89 %   Oxygen Saturation (Exit) 93 %   Rating of Perceived Exertion (Exercise) 13   Perceived Dyspnea (Exercise) 1   Duration Progress to 45 minutes of aerobic exercise without signs/symptoms of physical distress   Intensity THRR unchanged     Progression   Progression Continue to progress workloads to maintain intensity without signs/symptoms of physical distress.     Resistance Training   Training Prescription Yes   Weight orange bands   Reps 10-12  10 minutes of strength training     Interval Training   Interval  Training No     NuStep   Level 2   Minutes 17   METs 2.1     Rower   Level 1   Minutes 17     Track   Laps 20   Minutes 17     Goals Met:  Exercise tolerated well No report of cardiac concerns or symptoms Strength training completed today  Goals Unmet:  Not Applicable  Comments: Service time is from 1330 to 1500    Dr. Rush Farmer is Medical Director for Pulmonary Rehab at [facility].

## 2015-10-10 ENCOUNTER — Encounter (HOSPITAL_COMMUNITY)
Admission: RE | Admit: 2015-10-10 | Discharge: 2015-10-10 | Disposition: A | Discharge status: Home / Self Care | Payer: Non-veteran care | Source: Ambulatory Visit | Attending: Pulmonary Disease | Admitting: Pulmonary Disease

## 2015-10-10 VITALS — Wt 126.5 lb

## 2015-10-10 DIAGNOSIS — J449 Chronic obstructive pulmonary disease, unspecified: Secondary | ICD-10-CM

## 2015-10-10 NOTE — Progress Notes (Signed)
Pulmonary Individual Treatment Plan  Patient Details  Name: Patricia Johnston MRN: [id number] Date of Birth: 02/06/1951 Referring Provider:   April Manson Pulmonary Rehab Walk Test from 10/01/2015 in [place]  Referring Provider  Dr. Nelda Marseille      Initial Encounter Date:  Flowsheet Row Pulmonary Rehab Walk Test from 10/01/2015 in [place]  Date  10/01/15  Referring Provider  Dr. Nelda Marseille      Visit Diagnosis: Chronic obstructive pulmonary disease, unspecified COPD type ([place])  Patient's Home Medications on Admission:   Current Outpatient Prescriptions:  .  albuterol (PROVENTIL HFA;VENTOLIN HFA) 108 (90 Base) MCG/ACT inhaler, Inhale 1-2 puffs into the lungs every 6 (six) hours as needed for wheezing or shortness of breath., Disp: , Rfl:  .  Dextromethorphan-Guaifenesin 5-100 MG/5ML LIQD, Take 5 mLs by mouth every 4 (four) hours as needed., Disp: , Rfl:  .  ipratropium-albuterol (DUONEB) 0.5-2.5 (3) MG/3ML SOLN, Take 3 mLs by nebulization every 6 (six) hours as needed., Disp: , Rfl:  .  mometasone (ASMANEX) 220 MCG/INH inhaler, Inhale 2 puffs into the lungs daily., Disp: , Rfl:  .  OXYGEN, Inhale 2 L into the lungs daily as needed (for exertion). Reported on 04/19/2015, Disp: , Rfl:  .  predniSONE (DELTASONE) 10 MG tablet, Take 10 mg by mouth 2 (two) times daily with a meal. Takes as needed per [place], Disp: , Rfl:  .  Tiotropium Bromide-Olodaterol (STIOLTO RESPIMAT) 2.5-2.5 MCG/ACT AERS, Inhale 2 puffs into the lungs daily., Disp: , Rfl:   Past Medical History: Past Medical History:  Diagnosis Date  . Asthma   . COPD (chronic obstructive pulmonary disease) ([place])    pt states due to [place] contracted COPD  . Osteoporosis     Tobacco Use: History  Smoking Status  . Never Smoker  Smokeless Tobacco  . Never Used    Labs: Recent Review Flowsheet Data    There is no flowsheet data to display.      Capillary  Blood Glucose: No results found for: GLUCAP   ADL UCSD:     Pulmonary Assessment Scores    Row Name 10/02/15 1444         ADL UCSD   ADL Phase Entry     SOB Score total 62        Pulmonary Function Assessment:     Pulmonary Function Assessment - 09/27/15 1117      Breath   Bilateral Breath Sounds Clear   Shortness of Breath Yes;Limiting activity;Panic with Shortness of Breath      Exercise Target Goals:    Exercise Program Goal: Individual exercise prescription set with THRR, safety & activity barriers. Participant demonstrates ability to understand and report RPE using BORG scale, to self-measure pulse accurately, and to acknowledge the importance of the exercise prescription.  Exercise Prescription Goal: Starting with aerobic activity 30 plus minutes a day, 3 days per week for initial exercise prescription. Provide home exercise prescription and guidelines that participant acknowledges understanding prior to discharge.  Activity Barriers & Risk Stratification:     Activity Barriers & Cardiac Risk Stratification - 09/27/15 1116      Activity Barriers & Cardiac Risk Stratification   Activity Barriers Deconditioning;Arthritis;Shortness of Breath      6 Minute Walk:     6 Minute Walk    Row Name 10/01/15 1631         6 Minute Walk   Phase Initial  Distance 1460 feet     Walk Time 6 minutes     # of Rest Breaks 0     MPH 2.76     METS 3.07     RPE 11     Perceived Dyspnea  1     Symptoms No     Resting HR 92 bpm     Resting BP 100/62     Max Ex. HR 97 bpm     Max Ex. BP 104/60     2 Minute Post BP 104/60       Interval HR   Baseline HR 92     1 Minute HR 95     2 Minute HR 90     3 Minute HR 97     4 Minute HR 93     5 Minute HR 91     6 Minute HR 93     2 Minute Post HR 88     Interval Heart Rate? Yes       Interval Oxygen   Interval Oxygen? Yes     Baseline Oxygen Saturation % 92 %     Baseline Liters of Oxygen 0 L     1 Minute  Oxygen Saturation % 88 %     1 Minute Liters of Oxygen 0 L     2 Minute Oxygen Saturation % 88 %     2 Minute Liters of Oxygen 0 L     3 Minute Oxygen Saturation % 91 %     3 Minute Liters of Oxygen 0 L     4 Minute Oxygen Saturation % 92 %     4 Minute Liters of Oxygen 0 L     5 Minute Oxygen Saturation % 91 %     5 Minute Liters of Oxygen 0 L     6 Minute Oxygen Saturation % 94 %     6 Minute Liters of Oxygen 0 L     2 Minute Post Oxygen Saturation % 92 %     2 Minute Post Liters of Oxygen 0 L        Initial Exercise Prescription:     Initial Exercise Prescription - 10/01/15 1600      Date of Initial Exercise RX and Referring Provider   Date 10/01/15   Referring Provider Dr. Nelda Marseille     NuStep   Level 2   Minutes 17   METs 1.7     Rower   Level 1   Minutes 17     Track   Laps 8   Minutes 17     Prescription Details   Frequency (times per week) 2   Duration Progress to 45 minutes of aerobic exercise without signs/symptoms of physical distress     Intensity   THRR 40-80% of Max Heartrate 62-124   Ratings of Perceived Exertion 11-13   Perceived Dyspnea 0-4     Progression   Progression Continue progressive overload as per policy without signs/symptoms or physical distress.     Resistance Training   Training Prescription Yes   Weight orange bands   Reps 10-12      Perform Capillary Blood Glucose checks as needed.  Exercise Prescription Changes:     Exercise Prescription Changes    Row Name 10/08/15 1600             Response to Exercise   Blood Pressure (Admit) 104/60       Blood Pressure (Exercise) 98/60  Blood Pressure (Exit) 100/60       Heart Rate (Admit) 79 bpm       Heart Rate (Exercise) 109 bpm       Heart Rate (Exit) 90 bpm       Oxygen Saturation (Admit) 91 %       Oxygen Saturation (Exercise) 89 %       Oxygen Saturation (Exit) 93 %       Rating of Perceived Exertion (Exercise) 13       Perceived Dyspnea (Exercise) 1        Duration Progress to 45 minutes of aerobic exercise without signs/symptoms of physical distress       Intensity THRR unchanged         Progression   Progression Continue to progress workloads to maintain intensity without signs/symptoms of physical distress.         Resistance Training   Training Prescription Yes       Weight orange bands       Reps 10-12  10 minutes of strength training         Interval Training   Interval Training No         NuStep   Level 2       Minutes 17       METs 2.1         Rower   Level 1       Minutes 17         Track   Laps 20       Minutes 17          Exercise Comments:     Exercise Comments    Row Name 10/10/15 0830           Exercise Comments Patient has only attended one exercise session. First session went very well. Will cont. to monitor and progress.           Discharge Exercise Prescription (Final Exercise Prescription Changes):     Exercise Prescription Changes - 10/08/15 1600      Response to Exercise   Blood Pressure (Admit) 104/60   Blood Pressure (Exercise) 98/60   Blood Pressure (Exit) 100/60   Heart Rate (Admit) 79 bpm   Heart Rate (Exercise) 109 bpm   Heart Rate (Exit) 90 bpm   Oxygen Saturation (Admit) 91 %   Oxygen Saturation (Exercise) 89 %   Oxygen Saturation (Exit) 93 %   Rating of Perceived Exertion (Exercise) 13   Perceived Dyspnea (Exercise) 1   Duration Progress to 45 minutes of aerobic exercise without signs/symptoms of physical distress   Intensity THRR unchanged     Progression   Progression Continue to progress workloads to maintain intensity without signs/symptoms of physical distress.     Resistance Training   Training Prescription Yes   Weight orange bands   Reps 10-12  10 minutes of strength training     Interval Training   Interval Training No     NuStep   Level 2   Minutes 17   METs 2.1     Rower   Level 1   Minutes 17     Track   Laps 20   Minutes 17        Nutrition:  Target Goals: Understanding of nutrition guidelines, daily intake of sodium 1500mg , cholesterol 200mg , calories 30% from fat and 7% or less from saturated fats, daily to have 5 or more servings of fruits and vegetables.  Biometrics:  Pre Biometrics - 09/27/15 1118      Pre Biometrics   Grip Strength 39 kg       Nutrition Therapy Plan and Nutrition Goals:   Nutrition Discharge: Rate Your Plate Scores:   Psychosocial: Target Goals: Acknowledge presence or absence of depression, maximize coping skills, provide positive support system. Participant is able to verbalize types and ability to use techniques and skills needed for reducing stress and depression.  Initial Review & Psychosocial Screening:     Initial Psych Review & Screening - 09/27/15 [place]? Yes     Barriers   Psychosocial barriers to participate in program There are no identifiable barriers or psychosocial needs.     Screening Interventions   Interventions Encouraged to exercise      Quality of Life Scores:     Quality of Life - 10/02/15 1444      Quality of Life Scores   Health/Function Pre 6.29 %   Socioeconomic Pre 25.5 %   Psych/Spiritual Pre 18.43 %   Family Pre 16 %   GLOBAL Pre 13.53 %      PHQ-9: Recent Review Flowsheet Data    Depression screen [place] 2/9 09/27/2015   Decreased Interest 0   Down, Depressed, Hopeless 0   PHQ - 2 Score 0      Psychosocial Evaluation and Intervention:     Psychosocial Evaluation - 09/27/15 1122      Psychosocial Evaluation & Interventions   Interventions Encouraged to exercise with the program and follow exercise prescription      Psychosocial Re-Evaluation:     Psychosocial Re-Evaluation    [place] Name 10/10/15 0850             Psychosocial Re-Evaluation   Interventions Encouraged to attend Pulmonary Rehabilitation for the exercise       Comments no psychosocial issues  identified at this time       Continued Psychosocial Services Needed No         Education: Education Goals: Education classes will be provided on a weekly basis, covering required topics. Participant will state understanding/return demonstration of topics presented.  Learning Barriers/Preferences:     Learning Barriers/Preferences - 09/27/15 1116      Learning Barriers/Preferences   Learning Barriers None   Learning Preferences Written Material;Computer/Internet;Verbal Instruction;Audio      Education Topics: Risk Factor Reduction:  -Group instruction that is supported by a PowerPoint presentation. Instructor discusses the definition of a risk factor, different risk factors for pulmonary disease, and how the heart and lungs work together.     Nutrition for Pulmonary Patient:  -Group instruction provided by PowerPoint slides, verbal discussion, and written materials to support subject matter. The instructor gives an explanation and review of healthy diet recommendations, which includes a discussion on weight management, recommendations for fruit and vegetable consumption, as well as protein, fluid, caffeine, fiber, sodium, sugar, and alcohol. Tips for eating when patients are short of breath are discussed.   Pursed Lip Breathing:  -Group instruction that is supported by demonstration and informational handouts. Instructor discusses the benefits of pursed lip and diaphragmatic breathing and detailed demonstration on how to preform both.     Oxygen Safety:  -Group instruction provided by PowerPoint, verbal discussion, and written material to support subject matter. There is an overview of "What is Oxygen" and "Why do we need it".  Instructor also reviews how to create a safe environment for oxygen use,  the importance of using oxygen as prescribed, and the risks of noncompliance. There is a brief discussion on traveling with oxygen and resources the patient may utilize.   Oxygen  Equipment:  -Group instruction provided by [facility] Staff utilizing handouts, written materials, and equipment demonstrations.   Signs and Symptoms:  -Group instruction provided by written material and verbal discussion to support subject matter. Warning signs and symptoms of infection, stroke, and heart attack are reviewed and when to call the physician/911 reinforced. Tips for preventing the spread of infection discussed.   Advanced Directives:  -Group instruction provided by verbal instruction and written material to support subject matter. Instructor reviews Advanced Directive laws and proper instruction for filling out document.   Pulmonary Video:  -Group video education that reviews the importance of medication and oxygen compliance, exercise, good nutrition, pulmonary hygiene, and pursed lip and diaphragmatic breathing for the pulmonary patient.   Exercise for the Pulmonary Patient:  -Group instruction that is supported by a PowerPoint presentation. Instructor discusses benefits of exercise, core components of exercise, frequency, duration, and intensity of an exercise routine, importance of utilizing pulse oximetry during exercise, safety while exercising, and options of places to exercise outside of rehab.     Pulmonary Medications:  -Verbally interactive group education provided by instructor with focus on inhaled medications and proper administration.   Anatomy and Physiology of the Respiratory System and Intimacy:  -Group instruction provided by PowerPoint, verbal discussion, and written material to support subject matter. Instructor reviews respiratory cycle and anatomical components of the respiratory system and their functions. Instructor also reviews differences in obstructive and restrictive respiratory diseases with examples of each. Intimacy, Sex, and Sexuality differences are reviewed with a discussion on how relationships can change when diagnosed with pulmonary  disease. Common sexual concerns are reviewed.   Knowledge Questionnaire Score:     Knowledge Questionnaire Score - 10/02/15 1444      Knowledge Questionnaire Score   Pre Score 10/13      Core Components/Risk Factors/Patient Goals at Admission:     Personal Goals and Risk Factors at Admission - 09/27/15 1120      Core Components/Risk Factors/Patient Goals on Admission   Increase Strength and Stamina Yes   Intervention Provide advice, education, support and counseling about physical activity/exercise needs.;Develop an individualized exercise prescription for aerobic and resistive training based on initial evaluation findings, risk stratification, comorbidities and participant's personal goals.   Expected Outcomes Achievement of increased cardiorespiratory fitness and enhanced flexibility, muscular endurance and strength shown through measurements of functional capacity and personal statement of participant.   Improve shortness of breath with ADL's Yes   Intervention Provide education, individualized exercise plan and daily activity instruction to help decrease symptoms of SOB with activities of daily living.   Expected Outcomes Short Term: Achieves a reduction of symptoms when performing activities of daily living.   Develop more efficient breathing techniques such as purse lipped breathing and diaphragmatic breathing; and practicing self-pacing with activity Yes   Intervention Provide education, demonstration and support about specific breathing techniuqes utilized for more efficient breathing. Include techniques such as pursed lipped breathing, diaphragmatic breathing and self-pacing activity.   Expected Outcomes Short Term: Participant will be able to demonstrate and use breathing techniques as needed throughout daily activities.   Increase knowledge of respiratory medications and ability to use respiratory devices properly  Yes   Intervention Provide education and demonstration as  needed of appropriate use of medications, inhalers, and oxygen therapy.   Expected  Outcomes Short Term: Achieves understanding of medications use. Understands that oxygen is a medication prescribed by physician. Demonstrates appropriate use of inhaler and oxygen therapy.      Core Components/Risk Factors/Patient Goals Review:      Goals and Risk Factor Review    Row Name 10/10/15 0848             Core Components/Risk Factors/Patient Goals Review   Review Has only exercised in 1 session, too early to meet any goals       Expected Outcomes Should see improvement by the next review          Core Components/Risk Factors/Patient Goals at Discharge (Final Review):      Goals and Risk Factor Review - 10/10/15 0848      Core Components/Risk Factors/Patient Goals Review   Review Has only exercised in 1 session, too early to meet any goals   Expected Outcomes Should see improvement by the next review      ITP Comments:   Comments: ITP REVIEW Pt is making expected progress toward personal goals after completing 1 sessions.   Recommend continued exercise, life style modification, education, and utilization of breathing techniques to increase stamina and strength and decrease shortness of breath with exertion.

## 2015-10-10 NOTE — Progress Notes (Signed)
Daily Session Note  Patient Details  Name: Patricia Johnston MRN: [id number] Date of Birth: 07/08/1950 Referring Provider:   April Manson Pulmonary Rehab Walk Test from 10/01/2015 in [place]  Referring Provider  Dr. Nelda Marseille      Encounter Date: 10/10/2015  Check In:     Session Check In - 10/10/15 1330      Check-In   Location [facility]   Staff Present Rosebud Poles, RN, BSN;Molly diVincenzo, MS, ACSM RCEP, Exercise Physiologist;Tatelyn Vanhecke Ysidro Evert, RN   Supervising physician immediately available to respond to emergencies [facility] immediately available   Physician(s) Dr. Marthenia Rolling   Medication changes reported     No   Fall or balance concerns reported    No   Warm-up and Cool-down Performed as group-led instruction   Resistance Training Performed Yes   VAD Patient? No     Pain Assessment   Currently in Pain? No/denies   Multiple Pain Sites No      Capillary Blood Glucose: No results found for this or any previous visit (from the past 24 hour(s)).      Exercise Prescription Changes - 10/10/15 1600      Response to Exercise   Blood Pressure (Admit) 120/62   Blood Pressure (Exercise) 112/60   Blood Pressure (Exit) 106/60   Heart Rate (Admit) 77 bpm   Heart Rate (Exercise) 110 bpm   Heart Rate (Exit) 77 bpm   Oxygen Saturation (Admit) 93 %   Oxygen Saturation (Exercise) 92 %   Oxygen Saturation (Exit) 94 %   Rating of Perceived Exertion (Exercise) 13   Perceived Dyspnea (Exercise) 2   Duration Progress to 45 minutes of aerobic exercise without signs/symptoms of physical distress   Intensity THRR unchanged     Progression   Progression Continue to progress workloads to maintain intensity without signs/symptoms of physical distress.     Resistance Training   Training Prescription Yes   Weight orange bands   Reps 10-12  10 minutes of strength training     Interval Training   Interval Training No     Rower   Level 1   Minutes 17     Track   Laps 14   Minutes 17     Goals Met:  Exercise tolerated well No report of cardiac concerns or symptoms Strength training completed today  Goals Unmet:  Not Applicable  Comments: Service time is from 1330 to 1530    Dr. Rush Farmer is Medical Director for Pulmonary Rehab at [facility].

## 2015-10-15 ENCOUNTER — Encounter (HOSPITAL_COMMUNITY)
Admission: RE | Admit: 2015-10-15 | Discharge: 2015-10-15 | Disposition: A | Discharge status: Home / Self Care | Payer: Non-veteran care | Source: Ambulatory Visit | Attending: Pulmonary Disease | Admitting: Pulmonary Disease

## 2015-10-15 VITALS — Wt 126.1 lb

## 2015-10-15 DIAGNOSIS — J449 Chronic obstructive pulmonary disease, unspecified: Secondary | ICD-10-CM

## 2015-10-15 NOTE — Progress Notes (Signed)
Daily Session Note  Patient Details  Name: Patricia Johnston MRN: [id number] Date of Birth: March 01, 1951 Referring Provider:   April Manson Pulmonary Rehab Walk Test from 10/01/2015 in [place]  Referring Provider  Dr. Nelda Marseille      Encounter Date: 10/15/2015  Check In:     Session Check In - 10/15/15 1330      Check-In   Location [facility]   Staff Present Rosebud Poles, RN, BSN;Molly diVincenzo, MS, ACSM RCEP, Exercise Physiologist;Lisa Ysidro Evert, Felipe Drone, RN, MHA;Morgane Joerger Rollene Rotunda, RN, BSN   Supervising physician immediately available to respond to emergencies [facility] immediately available   Physician(s) Dr. Marily Memos   Medication changes reported     No   Fall or balance concerns reported    No   Warm-up and Cool-down Performed as group-led instruction   Resistance Training Performed Yes   VAD Patient? No     Pain Assessment   Currently in Pain? No/denies   Multiple Pain Sites No      Capillary Blood Glucose: No results found for this or any previous visit (from the past 24 hour(s)).      Exercise Prescription Changes - 10/15/15 1529      Exercise Review   Progression Yes     Response to Exercise   Blood Pressure (Admit) 110/60   Blood Pressure (Exercise) 120/60   Blood Pressure (Exit) 108/64   Heart Rate (Admit) 91 bpm   Heart Rate (Exercise) 104 bpm   Heart Rate (Exit) 88 bpm   Oxygen Saturation (Admit) 91 %   Oxygen Saturation (Exercise) 94 %   Oxygen Saturation (Exit) 93 %   Rating of Perceived Exertion (Exercise) 15   Perceived Dyspnea (Exercise) 3   Duration Progress to 45 minutes of aerobic exercise without signs/symptoms of physical distress   Intensity THRR unchanged     Progression   Progression Continue to progress workloads to maintain intensity without signs/symptoms of physical distress.     Resistance Training   Training Prescription Yes   Weight orange bands   Reps 10-12  10  minutes of strength training     Interval Training   Interval Training No     NuStep   Level 3   Minutes 17   METs 2.3     Rower   Level 1   Minutes 17     Track   Laps 26   Minutes 17     Goals Met:  Using PLB without cueing & demonstrates good technique Exercise tolerated well No report of cardiac concerns or symptoms Strength training completed today  Goals Unmet:  Not Applicable  Comments: Service time is from 1330 to 1510   Dr. Rush Farmer is Medical Director for Pulmonary Rehab at [facility].

## 2015-10-17 ENCOUNTER — Encounter (HOSPITAL_COMMUNITY)
Admission: RE | Admit: 2015-10-17 | Discharge: 2015-10-17 | Disposition: A | Discharge status: Home / Self Care | Payer: Non-veteran care | Source: Ambulatory Visit | Attending: Pulmonary Disease | Admitting: Pulmonary Disease

## 2015-10-17 DIAGNOSIS — J449 Chronic obstructive pulmonary disease, unspecified: Secondary | ICD-10-CM | POA: Diagnosis not present

## 2015-10-17 NOTE — Progress Notes (Signed)
Daily Session Note  Patient Details  Name: Patricia Johnston MRN: [id number] Date of Birth: November 10, 1950 Referring Provider:   April Manson Pulmonary Rehab Walk Test from 10/01/2015 in [place]  Referring Provider  Dr. Nelda Marseille      Encounter Date: 10/17/2015  Check In:     Session Check In - 10/17/15 1330      Check-In   Location [facility]   Staff Present Rosebud Poles, RN, BSN;Molly diVincenzo, MS, ACSM RCEP, Exercise Physiologist;Benjaman Artman Rollene Rotunda, Therapist, sports, BSN;Ramon Dredge, RN, [facility]   Supervising physician immediately available to respond to emergencies [facility] immediately available   Physician(s) Dr. Marthenia Rolling   Medication changes reported     No   Fall or balance concerns reported    No   Warm-up and Cool-down Performed as group-led instruction   Resistance Training Performed Yes   VAD Patient? No     Pain Assessment   Currently in Pain? No/denies   Multiple Pain Sites No      Capillary Blood Glucose: No results found for this or any previous visit (from the past 24 hour(s)).      Exercise Prescription Changes - 10/17/15 1449      Response to Exercise   Blood Pressure (Admit) 100/60   Blood Pressure (Exercise) 110/60   Blood Pressure (Exit) 110/66   Heart Rate (Admit) 91 bpm   Heart Rate (Exercise) 110 bpm   Heart Rate (Exit) 90 bpm   Oxygen Saturation (Admit) 93 %   Oxygen Saturation (Exercise) 90 %   Oxygen Saturation (Exit) 92 %   Rating of Perceived Exertion (Exercise) 12   Perceived Dyspnea (Exercise) 2   Duration Progress to 45 minutes of aerobic exercise without signs/symptoms of physical distress   Intensity THRR unchanged     Progression   Progression Continue to progress workloads to maintain intensity without signs/symptoms of physical distress.     Resistance Training   Training Prescription Yes   Weight orange bands   Reps 10-12  10 minutes of strength training     Interval Training   Interval Training No     NuStep   Level 3   Minutes 17   METs 1.9     Track   Laps 16   Minutes 17     Goals Met:  Using PLB without cueing & demonstrates good technique Exercise tolerated well Strength training completed today  Goals Unmet:  Not Applicable  Comments: Service time is from 1340 to 1440   Dr. Rush Farmer is Medical Director for Pulmonary Rehab at [facility].

## 2015-10-22 ENCOUNTER — Ambulatory Visit: Payer: Non-veteran care | Admitting: Internal Medicine

## 2015-10-22 ENCOUNTER — Encounter (HOSPITAL_COMMUNITY): Payer: Non-veteran care

## 2015-10-24 ENCOUNTER — Encounter (HOSPITAL_COMMUNITY): Payer: Non-veteran care

## 2015-10-29 ENCOUNTER — Encounter (HOSPITAL_COMMUNITY)
Admission: RE | Admit: 2015-10-29 | Discharge: 2015-10-29 | Disposition: A | Discharge status: Home / Self Care | Payer: Non-veteran care | Source: Ambulatory Visit | Attending: Pulmonary Disease | Admitting: Pulmonary Disease

## 2015-10-29 VITALS — Wt 128.1 lb

## 2015-10-29 DIAGNOSIS — J449 Chronic obstructive pulmonary disease, unspecified: Secondary | ICD-10-CM | POA: Diagnosis not present

## 2015-10-29 NOTE — Progress Notes (Signed)
Daily Session Note  Patient Details  Name: Patricia Johnston MRN: [id number] Date of Birth: May 26, 1950 Referring Provider:   April Manson Pulmonary Rehab Walk Test from 10/01/2015 in [place]  Referring Provider  Dr. Nelda Marseille      Encounter Date: 10/29/2015  Check In:     Session Check In - 10/29/15 1335      Check-In   Location [facility]   Staff Present Rosebud Poles, RN, BSN;Molly diVincenzo, MS, ACSM RCEP, Exercise Physiologist;Lyndie Vanderloop Ysidro Evert, RN;Portia Rollene Rotunda, RN, BSN   Supervising physician immediately available to respond to emergencies [facility] immediately available   Physician(s) Dr. Marily Memos   Medication changes reported     No   Fall or balance concerns reported    No   Warm-up and Cool-down Performed as group-led instruction   Resistance Training Performed Yes   VAD Patient? No     Pain Assessment   Currently in Pain? No/denies   Multiple Pain Sites No      Capillary Blood Glucose: No results found for this or any previous visit (from the past 24 hour(s)).      Exercise Prescription Changes - 10/29/15 1500      Response to Exercise   Blood Pressure (Admit) 96/64   Blood Pressure (Exercise) 108/60   Blood Pressure (Exit) 104/60   Heart Rate (Admit) 83 bpm   Heart Rate (Exercise) 104 bpm   Heart Rate (Exit) 81 bpm   Oxygen Saturation (Admit) 93 %   Oxygen Saturation (Exercise) 92 %   Oxygen Saturation (Exit) 93 %   Rating of Perceived Exertion (Exercise) 13   Perceived Dyspnea (Exercise) 2   Duration Progress to 45 minutes of aerobic exercise without signs/symptoms of physical distress   Intensity THRR unchanged     Progression   Progression Continue to progress workloads to maintain intensity without signs/symptoms of physical distress.     Resistance Training   Training Prescription Yes   Weight orange bands   Reps 10-12  10 minutes of strength training     Interval Training   Interval  Training No     NuStep   Level 3   Minutes 17   METs 1.8     Rower   Level 1   Minutes 17     Track   Laps 20   Minutes 17     Goals Met:  Exercise tolerated well No report of cardiac concerns or symptoms Strength training completed today  Goals Unmet:  Not Applicable  Comments: Service time is from 1330 to 1500    Dr. Rush Farmer is Medical Director for Pulmonary Rehab at [facility].

## 2015-10-29 NOTE — Progress Notes (Addendum)
Brenee Foody 65 y.o. female Nutrition Note Spoke with pt. Pt is at a normal wt for her ht. Per pt, her UBW range is 140-143 lb. Pt wt is down 12-15 lb from her reported UBW. Pt wt is up 0.9 kg (~2 lb) since starting rehab. Pt is drinking Ensure supplements 2 times/d. Pt's Rate Your Plate results not reviewed with pt due to pt need to gain wt. Pt educated re: high calorie, high protein diet. Pt is on Deltasone and reports taking Caltrate and drinking at least 1 glass of vitamin D chocolate milk every evening. Pt expressed understanding of the information reviewed via feedback method.    No results found for: HGBA1C Vitals - 1 value per visit 10/17/2015 10/15/2015 10/10/2015 10/08/2015 09/27/2015  Weight (lb) 126.1 126.1 126.54 125.22 127.87   Vitals - 1 value per visit 07/18/2015 04/19/2015  Weight (lb) 128 135   Nutrition Diagnosis ? Food-and nutrition-related knowledge deficit related to lack of exposure to information as related to diagnosis of pulmonary disease ? Increased energy expenditure related to increased energy requirements during COPD exacerbation as evidenced by BMI < 21 and recent h/o wt loss. Nutrition Intervention ? Pt's individual nutrition plan and goals reviewed with pt. ? Benefits of adopting healthy eating habits discussed when pt's Rate Your Plate reviewed. ? Handouts given: High Calorie, High Protein diet, Suggestions for increasing calories and protein, and High Calorie, High Protein recipes ? Pt to attend the Nutrition and Lung Disease class ? Continual client-centered nutrition education by RD, as part of interdisciplinary care. Goal(s) 1. The pt will recognize symptoms that can interfere with adequate oral intake, such as shortness of breath, N/V, early satiety, fatigue, ability to secure and prepare food, taste and smell changes, chewing/swallowing difficulties, and/ or pain when eating. 2. Identify food quantities necessary to achieve wt loss of  -2# per week to a goal  wt loss of 2.7-10.9 kg (6-24 lb) at graduation from pulmonary rehab. Monitor and Evaluate progress toward nutrition goal with team.   Derek Mound, M.Ed, RD, LDN, CDE 10/29/2015 2:50 PM

## 2015-10-31 ENCOUNTER — Encounter (HOSPITAL_COMMUNITY)
Admission: RE | Admit: 2015-10-31 | Discharge: 2015-10-31 | Disposition: A | Discharge status: Home / Self Care | Payer: Non-veteran care | Source: Ambulatory Visit | Attending: Pulmonary Disease | Admitting: Pulmonary Disease

## 2015-10-31 VITALS — Wt 127.9 lb

## 2015-10-31 DIAGNOSIS — J449 Chronic obstructive pulmonary disease, unspecified: Secondary | ICD-10-CM | POA: Diagnosis not present

## 2015-10-31 NOTE — Progress Notes (Signed)
Daily Session Note  Patient Details  Name: Patricia Johnston MRN: [id number] Date of Birth: 16-May-1950 Referring Provider:   April Manson Pulmonary Rehab Walk Test from 10/01/2015 in [place]  Referring Provider  Dr. Nelda Marseille      Encounter Date: 10/31/2015  Check In:     Session Check In - 10/31/15 1333      Check-In   Location [facility]   Staff Present Rosebud Poles, RN, BSN;Molly diVincenzo, MS, ACSM RCEP, Exercise Physiologist;Clemens Lachman Ysidro Evert, Felipe Drone, RN, MHA;Portia Rollene Rotunda, RN, BSN   Supervising physician immediately available to respond to emergencies [facility] immediately available   Physician(s) Dr. Marily Memos   Medication changes reported     No   Fall or balance concerns reported    No   Warm-up and Cool-down Performed as group-led instruction   Resistance Training Performed Yes   VAD Patient? No     Pain Assessment   Currently in Pain? No/denies   Multiple Pain Sites No      Capillary Blood Glucose: No results found for this or any previous visit (from the past 24 hour(s)).      Exercise Prescription Changes - 10/31/15 1500      Exercise Review   Progression Yes     Response to Exercise   Blood Pressure (Admit) 104/60   Blood Pressure (Exercise) 126/70   Blood Pressure (Exit) 104/56   Heart Rate (Admit) 83 bpm   Heart Rate (Exercise) 95 bpm   Heart Rate (Exit) 78 bpm   Oxygen Saturation (Admit) 93 %   Oxygen Saturation (Exercise) 95 %   Oxygen Saturation (Exit) 92 %   Rating of Perceived Exertion (Exercise) 13   Perceived Dyspnea (Exercise) 3   Duration Progress to 45 minutes of aerobic exercise without signs/symptoms of physical distress   Intensity THRR unchanged     Progression   Progression Continue to progress workloads to maintain intensity without signs/symptoms of physical distress.     Resistance Training   Training Prescription Yes   Weight orange bands   Reps 10-12  10  minutes of strength training     Interval Training   Interval Training No     NuStep   Level 5   Minutes 17   METs 1.7     Rower   Level 1   Minutes 17     Goals Met:  Exercise tolerated well No report of cardiac concerns or symptoms Strength training completed today  Goals Unmet:  Not Applicable  Comments:  Service time is from 1330 to 1505     Dr. Rush Farmer is Medical Director for Pulmonary Rehab at [facility].

## 2015-11-05 ENCOUNTER — Encounter (HOSPITAL_COMMUNITY)
Admission: RE | Admit: 2015-11-05 | Discharge: 2015-11-05 | Disposition: A | Discharge status: Home / Self Care | Payer: Non-veteran care | Source: Ambulatory Visit | Attending: Pulmonary Disease | Admitting: Pulmonary Disease

## 2015-11-05 VITALS — Wt 126.1 lb

## 2015-11-05 DIAGNOSIS — M810 Age-related osteoporosis without current pathological fracture: Secondary | ICD-10-CM | POA: Diagnosis not present

## 2015-11-05 DIAGNOSIS — Z79899 Other long term (current) drug therapy: Secondary | ICD-10-CM | POA: Diagnosis not present

## 2015-11-05 DIAGNOSIS — J449 Chronic obstructive pulmonary disease, unspecified: Secondary | ICD-10-CM | POA: Insufficient documentation

## 2015-11-05 DIAGNOSIS — Z7951 Long term (current) use of inhaled steroids: Secondary | ICD-10-CM | POA: Insufficient documentation

## 2015-11-05 NOTE — Progress Notes (Signed)
Daily Session Note  Patient Details  Name: Patricia Johnston MRN: [id number] Date of Birth: [birth year] Referring Provider:   April Manson Pulmonary Rehab Walk Test from 10/01/2015 in [place]  Referring Provider  Dr. Nelda Marseille      Encounter Date: 11/05/2015  Check In:     Session Check In - 11/05/15 1345      Check-In   [facility]   Staff Present Rosebud Poles, RN, BSN;Fabiana Dromgoole Ysidro Evert, Felipe Drone, RN, MHA;Portia Rollene Rotunda, RN, BSN   Supervising physician immediately available to respond to emergencies [facility] immediately available   Physician(s) Dr. Marily Memos   Medication changes reported     No   Fall or balance concerns reported    No   Warm-up and Cool-down Performed as group-led instruction   Resistance Training Performed Yes   VAD Patient? No     Pain Assessment   Currently in Pain? No/denies   Multiple Pain Sites No      Capillary Blood Glucose: No results found for this or any previous visit (from the past 24 hour(s)).      Exercise Prescription Changes - 11/05/15 1500      Response to Exercise   Blood Pressure (Admit) 126/68   Blood Pressure (Exercise) 128/70   Blood Pressure (Exit) 116/60   Heart Rate (Admit) 83 bpm   Heart Rate (Exercise) 97 bpm   Heart Rate (Exit) 89 bpm   Oxygen Saturation (Admit) 93 %   Oxygen Saturation (Exercise) 92 %   Oxygen Saturation (Exit) 93 %   Rating of Perceived Exertion (Exercise) 17   Perceived Dyspnea (Exercise) 3   Duration Progress to 45 minutes of aerobic exercise without signs/symptoms of physical distress   Intensity THRR unchanged     Progression   Progression Continue to progress workloads to maintain intensity without signs/symptoms of physical distress.     Resistance Training   Training Prescription Yes   Weight orange bands   Reps 10-12  10 minutes of strength training     Interval Training   Interval Training No     NuStep   Level 5   Minutes 17   METs 2.5     Rower   Level 2   Minutes 17     Track   Laps 15   Minutes 17     Goals Met:  Exercise tolerated well No report of cardiac concerns or symptoms Strength training completed today  Goals Unmet:  Not Applicable  Comments: Service time is from 1330 to 1515    Dr. Rush Farmer is Medical Director for Pulmonary Rehab at [facility].

## 2015-11-07 ENCOUNTER — Encounter (HOSPITAL_COMMUNITY)
Admission: RE | Admit: 2015-11-07 | Discharge: 2015-11-07 | Disposition: A | Discharge status: Home / Self Care | Payer: Non-veteran care | Source: Ambulatory Visit | Attending: Pulmonary Disease | Admitting: Pulmonary Disease

## 2015-11-07 DIAGNOSIS — J449 Chronic obstructive pulmonary disease, unspecified: Secondary | ICD-10-CM

## 2015-11-07 NOTE — Progress Notes (Signed)
Incomplete Session Note  Patient Details  Name: Patricia Johnston MRN: [id number] Date of Birth: 1950/08/25 Referring Provider:   April Manson Pulmonary Rehab Walk Test from 10/01/2015 in [place]  Referring Provider  Dr. Sanda Klein did not complete her rehab session.  She only exercised for 17 minutes, was not charged for the session, she had a repairman coming to her home

## 2015-11-12 ENCOUNTER — Encounter (HOSPITAL_COMMUNITY)
Admission: RE | Admit: 2015-11-12 | Discharge: 2015-11-12 | Disposition: A | Discharge status: Home / Self Care | Payer: Non-veteran care | Source: Ambulatory Visit | Attending: Pulmonary Disease | Admitting: Pulmonary Disease

## 2015-11-12 VITALS — Wt 128.5 lb

## 2015-11-12 DIAGNOSIS — J449 Chronic obstructive pulmonary disease, unspecified: Secondary | ICD-10-CM

## 2015-11-12 NOTE — Progress Notes (Signed)
Daily Session Note  Patient Details  Name: Patricia Johnston MRN: [id number] Date of Birth: 1951/02/17 Referring Provider:   April Manson Pulmonary Rehab Walk Test from 10/01/2015 in [place]  Referring Provider  Dr. Nelda Marseille      Encounter Date: 11/12/2015  Check In:     Session Check In - 11/12/15 1341      Check-In   Location [facility]   Staff Present Rodney Langton, RN;Molly diVincenzo, MS, ACSM RCEP, Exercise Physiologist;Portia Rollene Rotunda, Therapist, sports, BSN;Ramon Dredge, RN, [facility]   Supervising physician immediately available to respond to emergencies [facility] immediately available   Physician(s) Dr. Marily Memos   Medication changes reported     No   Fall or balance concerns reported    No   Warm-up and Cool-down Performed as group-led instruction   Resistance Training Performed Yes   VAD Patient? No     Pain Assessment   Currently in Pain? No/denies   Multiple Pain Sites No      Capillary Blood Glucose: No results found for this or any previous visit (from the past 24 hour(s)).      Exercise Prescription Changes - 11/12/15 1400      Exercise Review   Progression Yes     Response to Exercise   Blood Pressure (Admit) 100/64   Blood Pressure (Exercise) 164/80   Blood Pressure (Exit) 100/74   Heart Rate (Admit) 80 bpm   Heart Rate (Exercise) 111 bpm   Heart Rate (Exit) 87 bpm   Oxygen Saturation (Admit) 92 %   Oxygen Saturation (Exercise) 88 %   Oxygen Saturation (Exit) 97 %   Rating of Perceived Exertion (Exercise) 13   Perceived Dyspnea (Exercise) 2   Duration Progress to 45 minutes of aerobic exercise without signs/symptoms of physical distress   Intensity THRR unchanged     Progression   Progression Continue to progress workloads to maintain intensity without signs/symptoms of physical distress.     Resistance Training   Training Prescription Yes   Weight orange bands   Reps 10-12  10 minutes of strength  training     Interval Training   Interval Training No     NuStep   Level 6   Minutes 17   METs 4     Rower   Level 3   Minutes 17     Track   Laps 26   Minutes 17     Goals Met:  Exercise tolerated well No report of cardiac concerns or symptoms Strength training completed today  Goals Unmet:  Not Applicable  Comments: Service time is from 1330 to 1500    Dr. Rush Farmer is Medical Director for Pulmonary Rehab at [facility].

## 2015-11-14 ENCOUNTER — Encounter: Payer: Self-pay | Admitting: Internal Medicine

## 2015-11-14 ENCOUNTER — Encounter (HOSPITAL_COMMUNITY)
Admission: RE | Admit: 2015-11-14 | Discharge: 2015-11-14 | Disposition: A | Discharge status: Home / Self Care | Payer: Non-veteran care | Source: Ambulatory Visit | Attending: Pulmonary Disease | Admitting: Pulmonary Disease

## 2015-11-14 ENCOUNTER — Ambulatory Visit (INDEPENDENT_AMBULATORY_CARE_PROVIDER_SITE_OTHER): Payer: Non-veteran care | Admitting: Internal Medicine

## 2015-11-14 VITALS — Wt 127.9 lb

## 2015-11-14 VITALS — BP 100/58 | HR 82 | Ht 65.0 in | Wt 127.4 lb

## 2015-11-14 DIAGNOSIS — D721 Eosinophilia, unspecified: Secondary | ICD-10-CM | POA: Insufficient documentation

## 2015-11-14 DIAGNOSIS — R768 Other specified abnormal immunological findings in serum: Secondary | ICD-10-CM | POA: Insufficient documentation

## 2015-11-14 DIAGNOSIS — J449 Chronic obstructive pulmonary disease, unspecified: Secondary | ICD-10-CM

## 2015-11-14 DIAGNOSIS — R760 Raised antibody titer: Secondary | ICD-10-CM | POA: Diagnosis not present

## 2015-11-14 DIAGNOSIS — J479 Bronchiectasis, uncomplicated: Secondary | ICD-10-CM | POA: Diagnosis not present

## 2015-11-14 NOTE — Progress Notes (Signed)
I have reviewed a Home Exercise Prescription with Sharren Bridge . Dempsey is currently exercising at home.  The patient was advised to walk 2-3 days a week for 30 minutes.  Cythnia and I discussed how to progress their exercise prescription.  The patient stated that their goals were to get into an exercise routine, be less shortness of breath, perform ADL's easier.  The patient stated that they understand the exercise prescription.  We reviewed exercise guidelines, target heart rate during exercise, oxygen use, weather, home pulse oximeter, endpoints for exercise, and goals.  Patient is encouraged to come to me with any questions. I will continue to follow up with the patient to assist them with progression and safety.

## 2015-11-14 NOTE — Progress Notes (Signed)
Pulmonary Individual Treatment Plan  Patient Details  Name: Patricia Johnston MRN: [id number] Date of Birth: 1951/02/24 Referring Provider:   April Manson Pulmonary Rehab Walk Test from 10/01/2015 in [place]  Referring Provider  Dr. Nelda Marseille      Initial Encounter Date:  Flowsheet Row Pulmonary Rehab Walk Test from 10/01/2015 in [place]  Date  10/01/15  Referring Provider  Dr. Nelda Marseille      Visit Diagnosis: Chronic obstructive pulmonary disease, unspecified COPD type ([place])  Patient's Home Medications on Admission:   Current Outpatient Prescriptions:  .  albuterol (PROVENTIL HFA;VENTOLIN HFA) 108 (90 Base) MCG/ACT inhaler, Inhale 1-2 puffs into the lungs every 6 (six) hours as needed for wheezing or shortness of breath., Disp: , Rfl:  .  Dextromethorphan-Guaifenesin 5-100 MG/5ML LIQD, Take 5 mLs by mouth every 4 (four) hours as needed., Disp: , Rfl:  .  ipratropium-albuterol (DUONEB) 0.5-2.5 (3) MG/3ML SOLN, Take 3 mLs by nebulization every 6 (six) hours as needed., Disp: , Rfl:  .  mometasone (ASMANEX) 220 MCG/INH inhaler, Inhale 2 puffs into the lungs daily., Disp: , Rfl:  .  OXYGEN, Inhale 2 L into the lungs daily as needed (for exertion). Reported on 04/19/2015, Disp: , Rfl:  .  predniSONE (DELTASONE) 10 MG tablet, Take 10 mg by mouth 2 (two) times daily with a meal. Takes as needed per [place], Disp: , Rfl:  .  Tiotropium Bromide-Olodaterol (STIOLTO RESPIMAT) 2.5-2.5 MCG/ACT AERS, Inhale 2 puffs into the lungs daily., Disp: , Rfl:   Past Medical History: Past Medical History:  Diagnosis Date  . Asthma   . COPD (chronic obstructive pulmonary disease) ([place])    pt states due to [place] contracted COPD  . Osteoporosis     Tobacco Use: History  Smoking Status  . Never Smoker  Smokeless Tobacco  . Never Used    Labs: Recent Review Flowsheet Data    There is no flowsheet data to display.      Capillary  Blood Glucose: No results found for: GLUCAP   ADL UCSD:     Pulmonary Assessment Scores    Row Name 10/02/15 1444         ADL UCSD   ADL Phase Entry     SOB Score total 62        Pulmonary Function Assessment:     Pulmonary Function Assessment - 09/27/15 1117      Breath   Bilateral Breath Sounds Clear   Shortness of Breath Yes;Limiting activity;Panic with Shortness of Breath      Exercise Target Goals:    Exercise Program Goal: Individual exercise prescription set with THRR, safety & activity barriers. Participant demonstrates ability to understand and report RPE using BORG scale, to self-measure pulse accurately, and to acknowledge the importance of the exercise prescription.  Exercise Prescription Goal: Starting with aerobic activity 30 plus minutes a day, 3 days per week for initial exercise prescription. Provide home exercise prescription and guidelines that participant acknowledges understanding prior to discharge.  Activity Barriers & Risk Stratification:     Activity Barriers & Cardiac Risk Stratification - 09/27/15 1116      Activity Barriers & Cardiac Risk Stratification   Activity Barriers Deconditioning;Arthritis;Shortness of Breath      6 Minute Walk:     6 Minute Walk    Row Name 10/01/15 1631         6 Minute Walk   Phase Initial  Distance 1460 feet     Walk Time 6 minutes     # of Rest Breaks 0     MPH 2.76     METS 3.07     RPE 11     Perceived Dyspnea  1     Symptoms No     Resting HR 92 bpm     Resting BP 100/62     Max Ex. HR 97 bpm     Max Ex. BP 104/60     2 Minute Post BP 104/60       Interval HR   Baseline HR 92     1 Minute HR 95     2 Minute HR 90     3 Minute HR 97     4 Minute HR 93     5 Minute HR 91     6 Minute HR 93     2 Minute Post HR 88     Interval Heart Rate? Yes       Interval Oxygen   Interval Oxygen? Yes     Baseline Oxygen Saturation % 92 %     Baseline Liters of Oxygen 0 L     1 Minute  Oxygen Saturation % 88 %     1 Minute Liters of Oxygen 0 L     2 Minute Oxygen Saturation % 88 %     2 Minute Liters of Oxygen 0 L     3 Minute Oxygen Saturation % 91 %     3 Minute Liters of Oxygen 0 L     4 Minute Oxygen Saturation % 92 %     4 Minute Liters of Oxygen 0 L     5 Minute Oxygen Saturation % 91 %     5 Minute Liters of Oxygen 0 L     6 Minute Oxygen Saturation % 94 %     6 Minute Liters of Oxygen 0 L     2 Minute Post Oxygen Saturation % 92 %     2 Minute Post Liters of Oxygen 0 L        Initial Exercise Prescription:     Initial Exercise Prescription - 10/01/15 1600      Date of Initial Exercise RX and Referring Provider   Date 10/01/15   Referring Provider Dr. Nelda Marseille     NuStep   Level 2   Minutes 17   METs 1.7     Rower   Level 1   Minutes 17     Track   Laps 8   Minutes 17     Prescription Details   Frequency (times per week) 2   Duration Progress to 45 minutes of aerobic exercise without signs/symptoms of physical distress     Intensity   THRR 40-80% of Max Heartrate 62-124   Ratings of Perceived Exertion 11-13   Perceived Dyspnea 0-4     Progression   Progression Continue progressive overload as per policy without signs/symptoms or physical distress.     Resistance Training   Training Prescription Yes   Weight orange bands   Reps 10-12      Perform Capillary Blood Glucose checks as needed.  Exercise Prescription Changes:     Exercise Prescription Changes    Row Name 10/08/15 1600 10/10/15 1600 10/15/15 1529 10/17/15 1449 10/29/15 1500     Exercise Review   Progression  -  - Yes  -  -     Response  to Exercise   Blood Pressure (Admit) 104/60 120/62 110/60 100/60 96/64   Blood Pressure (Exercise) 98/60 112/60 120/60 110/60 108/60   Blood Pressure (Exit) 100/60 106/60 108/64 110/66 104/60   Heart Rate (Admit) 79 bpm 77 bpm 91 bpm 91 bpm 83 bpm   Heart Rate (Exercise) 109 bpm 110 bpm 104 bpm 110 bpm 104 bpm   Heart Rate (Exit)  90 bpm 77 bpm 88 bpm 90 bpm 81 bpm   Oxygen Saturation (Admit) 91 % 93 % 91 % 93 % 93 %   Oxygen Saturation (Exercise) 89 % 92 % 94 % 90 % 92 %   Oxygen Saturation (Exit) 93 % 94 % 93 % 92 % 93 %   Rating of Perceived Exertion (Exercise) 13 13 15 12 13    Perceived Dyspnea (Exercise) 1 2 3 2 2    Duration Progress to 45 minutes of aerobic exercise without signs/symptoms of physical distress Progress to 45 minutes of aerobic exercise without signs/symptoms of physical distress Progress to 45 minutes of aerobic exercise without signs/symptoms of physical distress Progress to 45 minutes of aerobic exercise without signs/symptoms of physical distress Progress to 45 minutes of aerobic exercise without signs/symptoms of physical distress   Intensity THRR unchanged THRR unchanged THRR unchanged THRR unchanged THRR unchanged     Progression   Progression Continue to progress workloads to maintain intensity without signs/symptoms of physical distress. Continue to progress workloads to maintain intensity without signs/symptoms of physical distress. Continue to progress workloads to maintain intensity without signs/symptoms of physical distress. Continue to progress workloads to maintain intensity without signs/symptoms of physical distress. Continue to progress workloads to maintain intensity without signs/symptoms of physical distress.     Resistance Training   Training Prescription Yes Yes Yes Yes Yes   Weight orange bands orange bands orange bands orange bands orange bands   Reps 10-12  10 minutes of strength training 10-12  10 minutes of strength training 10-12  10 minutes of strength training 10-12  10 minutes of strength training 10-12  10 minutes of strength training     Interval Training   Interval Training No No No No No     NuStep   Level 2  - 3 3 3    Minutes 17  - 17 17 17    METs 2.1  - 2.3 1.9 1.8     Rower   Level 1 1 1   - 1   Minutes 17 17 17   - 17     Track   Laps 20 14 26 16 20     Minutes 17 17 17 17 17    Row Name 10/31/15 1500 11/05/15 1500 11/12/15 1400         Exercise Review   Progression Yes  - Yes       Response to Exercise   Blood Pressure (Admit) 104/60 126/68 100/64     Blood Pressure (Exercise) 126/70 128/70 164/80     Blood Pressure (Exit) 104/56 116/60 100/74     Heart Rate (Admit) 83 bpm 83 bpm 80 bpm     Heart Rate (Exercise) 95 bpm 97 bpm 111 bpm     Heart Rate (Exit) 78 bpm 89 bpm 87 bpm     Oxygen Saturation (Admit) 93 % 93 % 92 %     Oxygen Saturation (Exercise) 95 % 92 % 88 %     Oxygen Saturation (Exit) 92 % 93 % 97 %     Rating of Perceived Exertion (Exercise) 13 17  13     Perceived Dyspnea (Exercise) 3 3 2      Duration Progress to 45 minutes of aerobic exercise without signs/symptoms of physical distress Progress to 45 minutes of aerobic exercise without signs/symptoms of physical distress Progress to 45 minutes of aerobic exercise without signs/symptoms of physical distress     Intensity THRR unchanged THRR unchanged THRR unchanged       Progression   Progression Continue to progress workloads to maintain intensity without signs/symptoms of physical distress. Continue to progress workloads to maintain intensity without signs/symptoms of physical distress. Continue to progress workloads to maintain intensity without signs/symptoms of physical distress.       Resistance Training   Training Prescription Yes Yes Yes     Weight orange bands orange bands orange bands     Reps 10-12  10 minutes of strength training 10-12  10 minutes of strength training 10-12  10 minutes of strength training       Interval Training   Interval Training No No No       NuStep   Level 5 5 6      Minutes 17 17 17      METs 1.7 2.5 4       Rower   Level 1 2 3      Minutes 17 17 17        Track   Laps  - 15 26     Minutes  - 17 17        Exercise Comments:     Exercise Comments    Row Name 10/10/15 0830 11/11/15 1402         Exercise Comments  Patient has only attended one exercise session. First session went very well. Will cont. to monitor and progress.  Patient is progressing well in workloads. Will cont. to monitor.         Discharge Exercise Prescription (Final Exercise Prescription Changes):     Exercise Prescription Changes - 11/12/15 1400      Exercise Review   Progression Yes     Response to Exercise   Blood Pressure (Admit) 100/64   Blood Pressure (Exercise) 164/80   Blood Pressure (Exit) 100/74   Heart Rate (Admit) 80 bpm   Heart Rate (Exercise) 111 bpm   Heart Rate (Exit) 87 bpm   Oxygen Saturation (Admit) 92 %   Oxygen Saturation (Exercise) 88 %   Oxygen Saturation (Exit) 97 %   Rating of Perceived Exertion (Exercise) 13   Perceived Dyspnea (Exercise) 2   Duration Progress to 45 minutes of aerobic exercise without signs/symptoms of physical distress   Intensity THRR unchanged     Progression   Progression Continue to progress workloads to maintain intensity without signs/symptoms of physical distress.     Resistance Training   Training Prescription Yes   Weight orange bands   Reps 10-12  10 minutes of strength training     Interval Training   Interval Training No     NuStep   Level 6   Minutes 17   METs 4     Rower   Level 3   Minutes 17     Track   Laps 26   Minutes 17       Nutrition:  Target Goals: Understanding of nutrition guidelines, daily intake of sodium 1500mg , cholesterol 200mg , calories 30% from fat and 7% or less from saturated fats, daily to have 5 or more servings of fruits and vegetables.  Biometrics:     Pre  Biometrics - 09/27/15 1118      Pre Biometrics   Grip Strength 39 kg       Nutrition Therapy Plan and Nutrition Goals:     Nutrition Therapy & Goals - 10/29/15 1504      Nutrition Therapy   Diet High Calorie, High Protein diet     Personal Nutrition Goals   Personal Goal #1 1-2 lb wt gain/week to a goal wt gain goal of 6-12 lb or a weight of  140-143 lb at graduation from [place], educate and counsel regarding individualized specific dietary modifications aiming towards targeted core components such as weight, hypertension, lipid management, diabetes, heart failure and other comorbidities.;Nutrition handout(s) given to patient.  High Calorie, High Protein diet; Suggestions for increasing calories and protein; High Calorie, High Protein recipes   Expected Outcomes Short Term Goal: A plan has been developed with personal nutrition goals set during dietitian appointment.;Long Term Goal: Adherence to prescribed nutrition plan.      Nutrition Discharge: Rate Your Plate Scores:     Nutrition Assessments - 10/29/15 1504      Rate Your Plate Scores   Pre Score --  returned incomplete. Goal is for pt to gain wt.       Psychosocial: Target Goals: Acknowledge presence or absence of depression, maximize coping skills, provide positive support system. Participant is able to verbalize types and ability to use techniques and skills needed for reducing stress and depression.  Initial Review & Psychosocial Screening:     Initial Psych Review & Screening - 09/27/15 Yoe? Yes     Barriers   Psychosocial barriers to participate in program There are no identifiable barriers or psychosocial needs.     Screening Interventions   Interventions Encouraged to exercise      Quality of Life Scores:     Quality of Life - 10/02/15 1444      Quality of Life Scores   Health/Function Pre 6.29 %   Socioeconomic Pre 25.5 %   Psych/Spiritual Pre 18.43 %   Family Pre 16 %   GLOBAL Pre 13.53 %      PHQ-9: Recent Review Flowsheet Data    Depression screen [facility] 2/9 09/27/2015   Decreased Interest 0   Down, Depressed, Hopeless 0   PHQ - 2 Score 0      Psychosocial Evaluation and Intervention:     Psychosocial Evaluation - 09/27/15 1122       Psychosocial Evaluation & Interventions   Interventions Encouraged to exercise with the program and follow exercise prescription      Psychosocial Re-Evaluation:     Psychosocial Re-Evaluation    [place] Name 10/10/15 0850 11/07/15 [id number]           Psychosocial Re-Evaluation   Interventions Encouraged to attend Pulmonary Rehabilitation for the exercise Encouraged to attend Pulmonary Rehabilitation for the exercise      Comments no psychosocial issues identified at this time None identified at this time      Continued Psychosocial Services Needed No No        Education: Education Goals: Education classes will be provided on a weekly basis, covering required topics. Participant will state understanding/return demonstration of topics presented.  Learning Barriers/Preferences:     Learning Barriers/Preferences - 09/27/15 1116      Learning Barriers/Preferences   Learning Barriers None   Learning Preferences Written  Material;Computer/Internet;Verbal Instruction;Audio      Education Topics: Risk Factor Reduction:  -Group instruction that is supported by a PowerPoint presentation. Instructor discusses the definition of a risk factor, different risk factors for pulmonary disease, and how the heart and lungs work together.     Nutrition for Pulmonary Patient:  -Group instruction provided by PowerPoint slides, verbal discussion, and written materials to support subject matter. The instructor gives an explanation and review of healthy diet recommendations, which includes a discussion on weight management, recommendations for fruit and vegetable consumption, as well as protein, fluid, caffeine, fiber, sodium, sugar, and alcohol. Tips for eating when patients are short of breath are discussed. Flowsheet Row PULMONARY REHAB CHRONIC OBSTRUCTIVE PULMONARY DISEASE from 11/07/2015 in [place]  Date  10/10/15  Educator  RD  Instruction Review Code  2- meets  goals/outcomes      Pursed Lip Breathing:  -Group instruction that is supported by demonstration and informational handouts. Instructor discusses the benefits of pursed lip and diaphragmatic breathing and detailed demonstration on how to preform both.   Flowsheet Row PULMONARY REHAB CHRONIC OBSTRUCTIVE PULMONARY DISEASE from 11/07/2015 in [place]  Date  11/07/15  Educator  RT  Instruction Review Code  2- meets goals/outcomes      Oxygen Safety:  -Group instruction provided by PowerPoint, verbal discussion, and written material to support subject matter. There is an overview of "What is Oxygen" and "Why do we need it".  Instructor also reviews how to create a safe environment for oxygen use, the importance of using oxygen as prescribed, and the risks of noncompliance. There is a brief discussion on traveling with oxygen and resources the patient may utilize.   Oxygen Equipment:  -Group instruction provided by [facility] Staff utilizing handouts, written materials, and equipment demonstrations.   Signs and Symptoms:  -Group instruction provided by written material and verbal discussion to support subject matter. Warning signs and symptoms of infection, stroke, and heart attack are reviewed and when to call the physician/911 reinforced. Tips for preventing the spread of infection discussed.   Advanced Directives:  -Group instruction provided by verbal instruction and written material to support subject matter. Instructor reviews Advanced Directive laws and proper instruction for filling out document.   Pulmonary Video:  -Group video education that reviews the importance of medication and oxygen compliance, exercise, good nutrition, pulmonary hygiene, and pursed lip and diaphragmatic breathing for the pulmonary patient. Flowsheet Row PULMONARY REHAB CHRONIC OBSTRUCTIVE PULMONARY DISEASE from 10/31/2015 in [place]  Date   10/31/15  Instruction Review Code  2- meets goals/outcomes      Exercise for the Pulmonary Patient:  -Group instruction that is supported by a PowerPoint presentation. Instructor discusses benefits of exercise, core components of exercise, frequency, duration, and intensity of an exercise routine, importance of utilizing pulse oximetry during exercise, safety while exercising, and options of places to exercise outside of rehab.     Pulmonary Medications:  -Verbally interactive group education provided by instructor with focus on inhaled medications and proper administration.   Anatomy and Physiology of the Respiratory System and Intimacy:  -Group instruction provided by PowerPoint, verbal discussion, and written material to support subject matter. Instructor reviews respiratory cycle and anatomical components of the respiratory system and their functions. Instructor also reviews differences in obstructive and restrictive respiratory diseases with examples of each. Intimacy, Sex, and Sexuality differences are reviewed with a discussion on how relationships can change  when diagnosed with pulmonary disease. Common sexual concerns are reviewed.   Knowledge Questionnaire Score:     Knowledge Questionnaire Score - 10/02/15 1444      Knowledge Questionnaire Score   Pre Score 10/13      Core Components/Risk Factors/Patient Goals at Admission:     Personal Goals and Risk Factors at Admission - 09/27/15 1120      Core Components/Risk Factors/Patient Goals on Admission   Increase Strength and Stamina Yes   Intervention Provide advice, education, support and counseling about physical activity/exercise needs.;Develop an individualized exercise prescription for aerobic and resistive training based on initial evaluation findings, risk stratification, comorbidities and participant's personal goals.   Expected Outcomes Achievement of increased cardiorespiratory fitness and enhanced flexibility,  muscular endurance and strength shown through measurements of functional capacity and personal statement of participant.   Improve shortness of breath with ADL's Yes   Intervention Provide education, individualized exercise plan and daily activity instruction to help decrease symptoms of SOB with activities of daily living.   Expected Outcomes Short Term: Achieves a reduction of symptoms when performing activities of daily living.   Develop more efficient breathing techniques such as purse lipped breathing and diaphragmatic breathing; and practicing self-pacing with activity Yes   Intervention Provide education, demonstration and support about specific breathing techniuqes utilized for more efficient breathing. Include techniques such as pursed lipped breathing, diaphragmatic breathing and self-pacing activity.   Expected Outcomes Short Term: Participant will be able to demonstrate and use breathing techniques as needed throughout daily activities.   Increase knowledge of respiratory medications and ability to use respiratory devices properly  Yes   Intervention Provide education and demonstration as needed of appropriate use of medications, inhalers, and oxygen therapy.   Expected Outcomes Short Term: Achieves understanding of medications use. Understands that oxygen is a medication prescribed by physician. Demonstrates appropriate use of inhaler and oxygen therapy.      Core Components/Risk Factors/Patient Goals Review:      Goals and Risk Factor Review    Row Name 10/10/15 0848 11/07/15 0810           Core Components/Risk Factors/Patient Goals Review   Personal Goals Review  - Develop more efficient breathing techniques such as purse lipped breathing and diaphragmatic breathing and practicing self-pacing with activity.;Increase Strength and Stamina;Improve shortness of breath with ADL's;Increase knowledge of respiratory medications and ability to use respiratory devices properly.       Review Has only exercised in 1 session, too early to meet any goals Progressing well despite COPD exacerbation, on second round of antibiotics, nustep level 5, track 15 laps, rower level 2.      Expected Outcomes Should see improvement by the next review Progressing well         Core Components/Risk Factors/Patient Goals at Discharge (Final Review):      Goals and Risk Factor Review - 11/07/15 0810      Core Components/Risk Factors/Patient Goals Review   Personal Goals Review Develop more efficient breathing techniques such as purse lipped breathing and diaphragmatic breathing and practicing self-pacing with activity.;Increase Strength and Stamina;Improve shortness of breath with ADL's;Increase knowledge of respiratory medications and ability to use respiratory devices properly.   Review Progressing well despite COPD exacerbation, on second round of antibiotics, nustep level 5, track 15 laps, rower level 2.   Expected Outcomes Progressing well      ITP Comments:   Comments: ITP REVIEW Pt is making expected progress toward pulmonary rehab goals after completing 8  sessions. Recommend continued exercise, life style modification, education, and utilization of breathing techniques to increase stamina and strength and decrease shortness of breath with exertion.

## 2015-11-14 NOTE — Patient Instructions (Addendum)
ICD-9-CM ICD-10-CM   1. Chronic obstructive pulmonary disease, unspecified COPD type ([place]) 496 J44.9   2. 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 Elevated IgE level 795.79 R76.0   4. Bronchiectasis without complication ([place]) A999333 J47.9     I agree with recommendation for nucala as next step If you want an injection with longer experience post marketing then Arvid Right is a cnsideration Glad you are better after rehab Continue stiolto scheduled High dose flu shot 11/14/2015  Followup  -r eturn back to Dr Gwenette Greet

## 2015-11-14 NOTE — Progress Notes (Signed)
Subjective:     Patient ID: Patricia Johnston, female   DOB: [birth year], 65 y.o.   MRN: [id number]  HPI     IOV 04/19/2015  Chief Complaint  Patient presents with  . Pulmonary Consult    Pt referred by [facility] for COPD. Pt c/o DOE x several years & c/o prod cough with yellow mucus x 2 weeks. Pt c/o chest tightness when SOB.    65 year old African-American female former Counselling psychologist in the [employer]. Reports no smoking history and previously well till the year 2000 when she was sent to a camp in [place] which was condemned. There she spent 4 nights. The environment was extremely dirty with standing water and mold and significant dust and musty smell. They're asked to clean the carpets and the area. She said that the condition a cold air was extremely foul-smelling and was blowing directly on her. Within a few hours or immediately she started coughing. Symptoms got worse over the next few days. She was then placed in a hotel. Subsequently when she returned to her home in [place] she was diagnosed with asthma. Since then she's been on asthma management. She says that she has had approximately 10 pulmonary function test since then and her numbers range less than 70%. She never normalizes her numbers with treatment. She thinks she's been on many prednisone courses possibly. Then in the year 2010 diagnosis was changed to COPD. A few months ago she relocated to [facility] to live a retired life with her sister. She has now registered herself at the [place] in [place] with Dr. Cy Blamer use to be pulmonary physician here in this practice] she is due for a pulmonary function test 04/24/2015. She is now here because she wants to be referred to pulmonary rehabilitation. She reports that the [place] cannot refer her directly. She wants to keep cool pulmonologist 1 inside the [place] in one outside the [place].  It appears she is on triple inhaler therapy with Asmanex and Stiolto given to her by Dr. Lupita Leash. She feels Asmanex is  causing a headache. She's been on this for 2 weeks. In addition she is reporting sadness after starting the inhalers. She thinks inhalers are causing her sadness. But is no suicidal tendency.  COP CAT Score 38 and HIGH symptom burden   Chest x-ray 02/19/2015: Hyperinflated without any abnormalities. I personally visualized this chest x-ray  Blood work 02/19/2015 normal creat 0.98 mg percent and hemoglobin 14.1 g percent.   OV  07/18/2015  Chief Complaint  Patient presents with  . Follow-up    Pt c/o weight loss >7lbs in 3 mos. Pt c/o some wheeze and SOB over the past few days. She has started prednisone and has been taking Mucinex. Pt denies cough/CP/tightness. Pt never got referral to [facility].    COPD follow-up in this vet  Last visit was in February 2017. After that she went to the [place] in [place] and apparently she had PFT that showed 50% lung function. She apparently had a CT chest that was clear. She has a 7 pound unexplained weight loss despite good appetite. She is going to talk to this about with primary care physician. She is yet to start pulmonary rehabilitation and she is frustrated that we have not coordinated with the [facility] in ensuring pulmonary rehabilitation Center. We do not have the PFT records from the [place] as of yet. Otherwise for the last 3 days she's having increased yellow phlegm but no increase in wheezing or  shortness of breath. She is taking Mucinex and this is helping. Yellow phlegm is new. It is mild amount there is no associated fever or worsening cough.   OV 11/14/15  Chief Complaint  Patient presents with  . Follow-up    Pt states she is now in pulm rehab and is doing well. Pt states she has had a few flare ups since last OV. Pt currently on abx and she is currently having a prod cough with clear mucus. Pt states she has chest tightness in morning - this is not new.    Reviewed [facility] notes of Dr Gwenette Greet feb 2017 - has super high IgE, severe obstn +  bronchiectasis on CT + eosinophilia. He had referred her to allergy. According to the patient she subsequently saw Dr. Shelah Lewandowsky allergist at [place]. It appears based on discussion with the patient Nucala was recommended. Patient given spoke to another patient were taken the same drug. However patient because of lack of significant postmarketing worldwide experience she deferred the drug due to fear of side effects. Currently she is not on daily prednisone. She is only on inhaler therapy. Symptom score is elevated at 28 but improved compared to before as documented below. She generally appears to taking medications. She is happy with the current quality of life. She says that beyond Dr. Gwenette Greet she is here more as a second opinion. Currently on pulmonary rehabilitation and it is helping her significantly.      CAT COPD Symptom & Quality of Life Score (GSK trademark) 0 is no burden. 5 is highest burden 04/19/2015  11/14/2015   Never Cough -> Cough all the time 5 4  No phlegm in chest -> Chest is full of phlegm 5 3  No chest tightness -> Chest feels very tight 4 4  No dyspnea for 1 flight stairs/hill -> Very dyspneic for 1 flight of stairs 5 4  No limitations for ADL at home -> Very limited with ADL at home 5 4  Confident leaving home -> Not at all confident leaving home 5 3  Sleep soundly -> Do not sleep soundly because of lung condition 5 3  Lots of Energy -> No energy at all 4 3  TOTAL Score (max 40)  38 28     has a past medical history of Asthma; COPD (chronic obstructive pulmonary disease) ([place]); and Osteoporosis.   reports that she has never smoked. She has never used smokeless tobacco.  Past Surgical History:  Procedure Laterality Date  . ABDOMINAL HYSTERECTOMY    . bunionectomy      Allergies  Allergen Reactions  . Flonase [Fluticasone Propionate] Other (See Comments)    Nasal bleeding  . Nasacort [Triamcinolone] Other (See Comments)    Nasal bleeding    Immunization  History  Administered Date(s) Administered  . Influenza,inj,Quad PF,36+ Mos 11/01/2014    No family history on file.   Current Outpatient Prescriptions:  .  albuterol (PROVENTIL HFA;VENTOLIN HFA) 108 (90 Base) MCG/ACT inhaler, Inhale 1-2 puffs into the lungs every 6 (six) hours as needed for wheezing or shortness of breath., Disp: , Rfl:  .  Dextromethorphan-Guaifenesin 5-100 MG/5ML LIQD, Take 5 mLs by mouth every 4 (four) hours as needed., Disp: , Rfl:  .  ipratropium-albuterol (DUONEB) 0.5-2.5 (3) MG/3ML SOLN, Take 3 mLs by nebulization every 6 (six) hours as needed., Disp: , Rfl:  .  mometasone (ASMANEX) 220 MCG/INH inhaler, Inhale 2 puffs into the lungs daily., Disp: , Rfl:  .  OXYGEN, Inhale  2 L into the lungs daily as needed (for exertion). Reported on 04/19/2015, Disp: , Rfl:  .  predniSONE (DELTASONE) 10 MG tablet, Take 5 mg by mouth 2 (two) times daily with a meal. Takes as needed per [place], Disp: , Rfl:  .  Tiotropium Bromide-Olodaterol (STIOLTO RESPIMAT) 2.5-2.5 MCG/ACT AERS, Inhale 2 puffs into the lungs daily., Disp: , Rfl:     Review of Systems     Objective:   Physical Exam  Constitutional: She is oriented to person, place, and time. She appears well-developed and well-nourished. No distress.  HENT:  Head: Normocephalic and atraumatic.  Right Ear: External ear normal.  Left Ear: External ear normal.  Mouth/Throat: Oropharynx is clear and moist. No oropharyngeal exudate.  Eyes: Conjunctivae and EOM are normal. Pupils are equal, round, and reactive to light. Right eye exhibits no discharge. Left eye exhibits no discharge. No scleral icterus.  Neck: Normal range of motion. Neck supple. No JVD present. No tracheal deviation present. No thyromegaly present.  Cardiovascular: Normal rate, regular rhythm, normal heart sounds and intact distal pulses.  Exam reveals no gallop and no friction rub.   No murmur heard. Pulmonary/Chest: Effort normal and breath sounds normal. No  respiratory distress. She has no wheezes. She has no rales. She exhibits no tenderness.  Abdominal: Soft. Bowel sounds are normal. She exhibits no distension and no mass. There is no tenderness. There is no rebound and no guarding.  Musculoskeletal: Normal range of motion. She exhibits no edema or tenderness.  Lymphadenopathy:    She has no cervical adenopathy.  Neurological: She is alert and oriented to person, place, and time. She has normal reflexes. No cranial nerve deficit. She exhibits normal muscle tone. Coordination normal.  Skin: Skin is warm and dry. No rash noted. She is not diaphoretic. No erythema. No pallor.  Psychiatric: She has a normal mood and affect. Her behavior is normal. Judgment and thought content normal.  Vitals reviewed.   Vitals:   11/14/15 1101  BP: (!) 100/58  Pulse: 82  SpO2: 93%  Weight: 127 lb 6.4 oz (57.8 kg)  Height: 5\' 5"  (1.651 m)        Assessment:       ICD-9-CM ICD-10-CM   1. Chronic obstructive pulmonary disease, unspecified COPD type ([place]) 496 J44.9   2. 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 Elevated IgE level 795.79 R76.0   4. Bronchiectasis without complication (HCC) A999333 J47.9    Based on chart review in her history appears that her problem is significantly upset lung disease with elevated IgE and eosinophilia resulting in bronchiectasis. She might have ABPA but allergy notes are not available for my review. Nevertheless I support the plan to try NUCALA for her. I explained this to her. However she wants to defer this. She is agreeable to return back to the [place] Dr. Gwenette Greet so she can consolidate follow-up in [place]:       I agree with recommendation for nucala as next step If you want an injection with longer experience post marketing then Arvid Right is a cnsideration Glad you are better after rehab Continue stiolto scheduled High dose flu shot 11/14/2015  Followup  -r eturn back to Dr Gwenette Greet  Dr. Brand Males, M.D.,  [facility].C.P Pulmonary and Critical Care Medicine Staff Physician [place] [facility] Pager: [phone], If no answer or between  15:00h - 7:00h: call [phone]  11/14/2015 11:31 AM

## 2015-11-14 NOTE — Progress Notes (Signed)
Daily Session Note  Patient Details  Name: Patricia Johnston MRN: [id number] Date of Birth: [birth year] Referring Provider:   April Manson Pulmonary Rehab Walk Test from 10/01/2015 in [place]  Referring Provider  Dr. Nelda Marseille      Encounter Date: 11/14/2015  Check In:     Session Check In - 11/14/15 1333      Check-In   Staff Present Su Hilt, MS, ACSM RCEP, Exercise Physiologist   Supervising physician immediately available to respond to emergencies [facility] immediately available   Physician(s) Dr. Waldron Labs   Medication changes reported     No   Fall or balance concerns reported    No   Warm-up and Cool-down Performed as group-led instruction   Resistance Training Performed Yes   VAD Patient? No     Pain Assessment   Currently in Pain? No/denies   Multiple Pain Sites No      Capillary Blood Glucose: No results found for this or any previous visit (from the past 24 hour(s)).      Exercise Prescription Changes - 11/14/15 1500      Response to Exercise   Blood Pressure (Admit) 104/64   Blood Pressure (Exercise) 110/60   Blood Pressure (Exit) 84/62  recheck BP 114/52   Heart Rate (Admit) 80 bpm   Heart Rate (Exercise) 103 bpm   Heart Rate (Exit) 76 bpm   Oxygen Saturation (Admit) 91 %   Oxygen Saturation (Exercise) 90 %   Oxygen Saturation (Exit) 90 %   Rating of Perceived Exertion (Exercise) 13   Perceived Dyspnea (Exercise) 3   Duration Progress to 45 minutes of aerobic exercise without signs/symptoms of physical distress   Intensity THRR unchanged     Progression   Progression Continue to progress workloads to maintain intensity without signs/symptoms of physical distress.     Resistance Training   Training Prescription Yes   Weight orange bands   Reps 10-12  10 minutes of strength training     Interval Training   Interval Training No     NuStep   Level 6   Minutes 17   METs 3.6     Track   Laps 11    Minutes 17     Goals Met:  Exercise tolerated well No report of cardiac concerns or symptoms Strength training completed today  Goals Unmet:  Not Applicable  Comments: Service time is from 1330 to 1500    Dr. Rush Farmer is Medical Director for Pulmonary Rehab at [facility].

## 2015-11-19 ENCOUNTER — Encounter (HOSPITAL_COMMUNITY)
Admission: RE | Admit: 2015-11-19 | Discharge: 2015-11-19 | Disposition: A | Discharge status: Home / Self Care | Payer: Non-veteran care | Source: Ambulatory Visit | Attending: Pulmonary Disease | Admitting: Pulmonary Disease

## 2015-11-19 VITALS — Wt 126.5 lb

## 2015-11-19 DIAGNOSIS — J449 Chronic obstructive pulmonary disease, unspecified: Secondary | ICD-10-CM

## 2015-11-19 NOTE — Progress Notes (Signed)
Daily Session Note  Patient Details  Name: Patricia Johnston MRN: [id number] Date of Birth: 09-Aug-1950 Referring Provider:   April Manson Pulmonary Rehab Walk Test from 10/01/2015 in [place]  Referring Provider  Dr. Nelda Marseille      Encounter Date: 11/19/2015  Check In:     Session Check In - 11/19/15 1356      Check-In   Location [facility]   Staff Present Rosebud Poles, RN, BSN;Lisa Ysidro Evert, RN;Portia Rollene Rotunda, RN, BSN;Sapphire Tygart, MS, ACSM RCEP, Exercise Physiologist   Supervising physician immediately available to respond to emergencies [facility] immediately available   Physician(s) Dr. Waldron Labs   Medication changes reported     No   Fall or balance concerns reported    No   Warm-up and Cool-down Performed as group-led instruction   Resistance Training Performed No   VAD Patient? No     Pain Assessment   Currently in Pain? No/denies   Multiple Pain Sites No      Capillary Blood Glucose: No results found for this or any previous visit (from the past 24 hour(s)).      Exercise Prescription Changes - 11/19/15 1500      Response to Exercise   Blood Pressure (Admit) 104/60   Blood Pressure (Exercise) 120/76   Blood Pressure (Exit) 96/60  recheck BP 114/52   Heart Rate (Admit) 71 bpm   Heart Rate (Exercise) 99 bpm   Heart Rate (Exit) 88 bpm   Oxygen Saturation (Admit) 92 %   Oxygen Saturation (Exercise) 93 %   Oxygen Saturation (Exit) 93 %   Rating of Perceived Exertion (Exercise) 13   Perceived Dyspnea (Exercise) 2   Duration Progress to 45 minutes of aerobic exercise without signs/symptoms of physical distress   Intensity THRR unchanged     Progression   Progression Continue to progress workloads to maintain intensity without signs/symptoms of physical distress.     Resistance Training   Training Prescription Yes   Weight orange bands   Reps 10-12  10 minutes of strength training     Interval  Training   Interval Training No     NuStep   Level 6   Minutes 17   METs 4.4     Rower   Level 3   Minutes 17     Track   Laps 23   Minutes 17     Goals Met:  Exercise tolerated well No report of cardiac concerns or symptoms Strength training completed today  Goals Unmet:  Not Applicable  Comments: Service time is from 1:30pm to 3:00pm    Dr. Rush Farmer is Medical Director for Pulmonary Rehab at [facility].

## 2015-11-21 ENCOUNTER — Encounter (HOSPITAL_COMMUNITY)
Admission: RE | Admit: 2015-11-21 | Discharge: 2015-11-21 | Disposition: A | Discharge status: Home / Self Care | Payer: Non-veteran care | Source: Ambulatory Visit | Attending: Pulmonary Disease | Admitting: Pulmonary Disease

## 2015-11-21 ENCOUNTER — Encounter (HOSPITAL_COMMUNITY): Payer: Self-pay

## 2015-11-21 VITALS — Wt 125.9 lb

## 2015-11-21 DIAGNOSIS — J449 Chronic obstructive pulmonary disease, unspecified: Secondary | ICD-10-CM

## 2015-11-21 NOTE — Progress Notes (Signed)
Daily Session Note  Patient Details  Name: Patricia Johnston MRN: [id number] Date of Birth: [birth year] Referring Provider:   April Manson Pulmonary Rehab Walk Test from 10/01/2015 in [place]  Referring Provider  Dr. Nelda Marseille      Encounter Date: 11/21/2015  Check In:     Session Check In - 11/21/15 1330      Check-In   Location [facility]   Staff Present Rosebud Poles, RN, BSN;Molly diVincenzo, MS, ACSM RCEP, Exercise Physiologist;Lisa Ysidro Evert, Felipe Drone, RN, MHA;Portia Rollene Rotunda, RN, BSN   Supervising physician immediately available to respond to emergencies [facility] immediately available   Physician(s) Dr. Dyann Kief   Medication changes reported     No   Fall or balance concerns reported    No   Warm-up and Cool-down Performed as group-led instruction   Resistance Training Performed Yes   VAD Patient? No     Pain Assessment   Currently in Pain? No/denies   Multiple Pain Sites No      Capillary Blood Glucose: No results found for this or any previous visit (from the past 24 hour(s)).      Exercise Prescription Changes - 11/21/15 1500      Response to Exercise   Blood Pressure (Admit) 104/56   Blood Pressure (Exercise) 130/78   Blood Pressure (Exit) 110/60   Heart Rate (Admit) 74 bpm   Heart Rate (Exercise) 108 bpm   Heart Rate (Exit) 99 bpm   Oxygen Saturation (Admit) 91 %   Oxygen Saturation (Exercise) 92 %   Oxygen Saturation (Exit) 94 %   Rating of Perceived Exertion (Exercise) 13   Perceived Dyspnea (Exercise) 3   Duration Progress to 45 minutes of aerobic exercise without signs/symptoms of physical distress   Intensity THRR unchanged     Progression   Progression Continue to progress workloads to maintain intensity without signs/symptoms of physical distress.     Resistance Training   Training Prescription Yes   Weight orange bands   Reps 10-12  10 minutes of strength training     Interval Training   Interval Training No     NuStep   Level 6   Minutes 17   METs 4.3     Rower   Level 3   Minutes 17     Goals Met:  Exercise tolerated well Strength training completed today  Goals Unmet:  Not Applicable  Comments: Service time is from 1330 to 1500    Dr. Rush Farmer is Medical Director for Pulmonary Rehab at [facility].

## 2015-11-26 ENCOUNTER — Encounter (HOSPITAL_COMMUNITY): Admission: RE | Admit: 2015-11-26 | Payer: Non-veteran care | Source: Ambulatory Visit

## 2015-11-28 ENCOUNTER — Encounter (HOSPITAL_COMMUNITY)
Admission: RE | Admit: 2015-11-28 | Discharge: 2015-11-28 | Disposition: A | Discharge status: Home / Self Care | Payer: Non-veteran care | Source: Ambulatory Visit | Attending: Pulmonary Disease | Admitting: Pulmonary Disease

## 2015-11-28 VITALS — Wt 127.4 lb

## 2015-11-28 DIAGNOSIS — J449 Chronic obstructive pulmonary disease, unspecified: Secondary | ICD-10-CM

## 2015-11-28 NOTE — Progress Notes (Signed)
Daily Session Note  Patient Details  Name: Patricia Johnston MRN: [id number] Date of Birth: 1950-07-03 Referring Provider:   April Manson Pulmonary Rehab Walk Test from 10/01/2015 in [place]  Referring Provider  Dr. Nelda Marseille      Encounter Date: 11/28/2015  Check In:     Session Check In - 11/28/15 1340      Check-In   [facility]   Staff Present Rosebud Poles, RN, BSN;Molly diVincenzo, MS, ACSM RCEP, Exercise Physiologist;Serai Tukes Ysidro Evert, RN;Portia Rollene Rotunda, RN, BSN   Supervising physician immediately available to respond to emergencies [facility] immediately available   Physician(s) Dr. Cathlean Sauer   Medication changes reported     No   Fall or balance concerns reported    No   Warm-up and Cool-down Performed as group-led instruction   Resistance Training Performed Yes   VAD Patient? No     Pain Assessment   Currently in Pain? No/denies   Multiple Pain Sites No      Capillary Blood Glucose: No results found for this or any previous visit (from the past 24 hour(s)).      Exercise Prescription Changes - 11/28/15 1600      Response to Exercise   Blood Pressure (Admit) 110/60   Blood Pressure (Exercise) 140/80   Blood Pressure (Exit) 100/60   Heart Rate (Admit) 74 bpm   Heart Rate (Exercise) 99 bpm   Heart Rate (Exit) 79 bpm   Oxygen Saturation (Admit) 93 %   Oxygen Saturation (Exercise) 91 %   Oxygen Saturation (Exit) 92 %   Rating of Perceived Exertion (Exercise) 14   Perceived Dyspnea (Exercise) 2   Duration Progress to 45 minutes of aerobic exercise without signs/symptoms of physical distress   Intensity THRR unchanged     Progression   Progression Continue to progress workloads to maintain intensity without signs/symptoms of physical distress.     Resistance Training   Training Prescription Yes   Weight orange bands   Reps 10-12  10 minutes of strength training     Interval Training   Interval  Training No     Elliptical   Level 2   Minutes 17     Track   Laps 15   Minutes 17     Goals Met:  Exercise tolerated well No report of cardiac concerns or symptoms Strength training completed today  Goals Unmet:  Not Applicable  Comments: Service time is from 1330 to 1515     Dr. Rush Farmer is Medical Director for Pulmonary Rehab at [facility]

## 2015-12-03 ENCOUNTER — Encounter (HOSPITAL_COMMUNITY)
Admission: RE | Admit: 2015-12-03 | Discharge: 2015-12-03 | Disposition: A | Discharge status: Home / Self Care | Payer: Non-veteran care | Source: Ambulatory Visit | Attending: Pulmonary Disease | Admitting: Pulmonary Disease

## 2015-12-03 VITALS — Wt 126.3 lb

## 2015-12-03 DIAGNOSIS — Z7951 Long term (current) use of inhaled steroids: Secondary | ICD-10-CM | POA: Diagnosis not present

## 2015-12-03 DIAGNOSIS — M810 Age-related osteoporosis without current pathological fracture: Secondary | ICD-10-CM | POA: Insufficient documentation

## 2015-12-03 DIAGNOSIS — J449 Chronic obstructive pulmonary disease, unspecified: Secondary | ICD-10-CM

## 2015-12-03 DIAGNOSIS — Z79899 Other long term (current) drug therapy: Secondary | ICD-10-CM | POA: Diagnosis not present

## 2015-12-03 NOTE — Progress Notes (Signed)
Daily Session Note  Patient Details  Name: Patricia Johnston MRN: [id number] Date of Birth: [birth year] Referring Provider:   April Manson Pulmonary Rehab Walk Test from 10/01/2015 in [place]  Referring Provider  Dr. Nelda Marseille      Encounter Date: 12/03/2015  Check In:     Session Check In - 12/03/15 1357      Check-In   [facility]   Staff Present Rosebud Poles, RN, BSN;Amarise Lillo Ysidro Evert, RN;Molly diVincenzo, MS, ACSM RCEP, Exercise Physiologist;Portia Rollene Rotunda, RN, BSN   Supervising physician immediately available to respond to emergencies [facility] immediately available   Physician(s) Cr. Choi   Medication changes reported     No   Fall or balance concerns reported    No   Warm-up and Cool-down Performed as group-led Location manager Performed Yes   VAD Patient? No     Pain Assessment   Currently in Pain? No/denies   Multiple Pain Sites No      Capillary Blood Glucose: No results found for this or any previous visit (from the past 24 hour(s)).      Exercise Prescription Changes - 12/03/15 1600      Response to Exercise   Blood Pressure (Admit) 96/50   Blood Pressure (Exercise) 112/60   Blood Pressure (Exit) 100/58   Heart Rate (Admit) 78 bpm   Heart Rate (Exercise) 102 bpm   Heart Rate (Exit) 79 bpm   Oxygen Saturation (Admit) 93 %   Oxygen Saturation (Exercise) 92 %   Oxygen Saturation (Exit) 93 %   Rating of Perceived Exertion (Exercise) 14   Perceived Dyspnea (Exercise) 2   Duration Progress to 45 minutes of aerobic exercise without signs/symptoms of physical distress   Intensity THRR unchanged     Progression   Progression Continue to progress workloads to maintain intensity without signs/symptoms of physical distress.     Resistance Training   Training Prescription Yes   Weight orange bands   Reps 10-12  10 minutes of strength training     Interval Training   Interval Training  No     Elliptical   Level 2   Minutes 17     Rower   Level 3   Minutes 17     Track   Laps 24   Minutes 17     Goals Met:  Exercise tolerated well No report of cardiac concerns or symptoms Strength training completed today  Goals Unmet:  Not Applicable  Comments: Service time is from 1330 to 1515    Dr. Rush Farmer is Medical Director for Pulmonary Rehab at [facility].

## 2015-12-05 ENCOUNTER — Encounter (HOSPITAL_COMMUNITY)
Admission: RE | Admit: 2015-12-05 | Discharge: 2015-12-05 | Disposition: A | Discharge status: Home / Self Care | Payer: Non-veteran care | Source: Ambulatory Visit | Attending: Pulmonary Disease | Admitting: Pulmonary Disease

## 2015-12-05 VITALS — Wt 125.7 lb

## 2015-12-05 DIAGNOSIS — J449 Chronic obstructive pulmonary disease, unspecified: Secondary | ICD-10-CM

## 2015-12-05 NOTE — Progress Notes (Signed)
Daily Session Note  Patient Details  Name: Patricia Johnston MRN: [id number] Date of Birth: 1950-03-10 Referring Provider:   April Manson Pulmonary Rehab Walk Test from 10/01/2015 in [place]  Referring Provider  Dr. Nelda Marseille      Encounter Date: 12/05/2015  Check In:     Session Check In - 12/05/15 1357      Check-In   Location [facility]   Staff Present Rosebud Poles, RN, BSN;Lisa Ysidro Evert, RN;Molly diVincenzo, MS, ACSM RCEP, Exercise Physiologist;Portia Rollene Rotunda, RN, BSN   Supervising physician immediately available to respond to emergencies [facility] immediately available   Physician(s) Dr. Maryland Pink   Medication changes reported     No   Fall or balance concerns reported    No   Warm-up and Cool-down Performed as group-led instruction   Resistance Training Performed Yes   VAD Patient? No     Pain Assessment   Currently in Pain? No/denies   Multiple Pain Sites No      Capillary Blood Glucose: No results found for this or any previous visit (from the past 24 hour(s)).      Exercise Prescription Changes - 12/05/15 1600      Response to Exercise   Blood Pressure (Admit) 100/58   Blood Pressure (Exercise) 126/80   Blood Pressure (Exit) 92/60   Heart Rate (Admit) 82 bpm   Heart Rate (Exercise) 104 bpm   Heart Rate (Exit) 80 bpm   Oxygen Saturation (Admit) 93 %   Oxygen Saturation (Exercise) 92 %   Oxygen Saturation (Exit) 93 %   Rating of Perceived Exertion (Exercise) 14   Perceived Dyspnea (Exercise) 1   Duration Progress to 45 minutes of aerobic exercise without signs/symptoms of physical distress   Intensity THRR unchanged     Progression   Progression Continue to progress workloads to maintain intensity without signs/symptoms of physical distress.     Resistance Training   Training Prescription Yes   Weight orange bands   Reps 10-12  10 minutes of strength training     Interval Training   Interval  Training No     Rower   Level 3   Minutes 17     Track   Laps 19   Minutes 17     Goals Met:  Exercise tolerated well Strength training completed today  Goals Unmet:  Not Applicable  Comments: Service time is from 1330 to 1515    Dr. Rush Farmer is Medical Director for Pulmonary Rehab at [facility].

## 2015-12-10 ENCOUNTER — Encounter (HOSPITAL_COMMUNITY)
Admission: RE | Admit: 2015-12-10 | Discharge: 2015-12-10 | Disposition: A | Discharge status: Home / Self Care | Payer: Non-veteran care | Source: Ambulatory Visit | Attending: Pulmonary Disease | Admitting: Pulmonary Disease

## 2015-12-10 VITALS — Wt 127.4 lb

## 2015-12-10 DIAGNOSIS — J449 Chronic obstructive pulmonary disease, unspecified: Secondary | ICD-10-CM

## 2015-12-10 NOTE — Progress Notes (Signed)
Pulmonary Individual Treatment Plan  Patient Details  Name: Patricia Johnston MRN: [id number] Date of Birth: 1950-12-10 Referring Provider:   April Manson Pulmonary Rehab Walk Test from 10/01/2015 in [facility]  Referring Provider  Dr. Nelda Marseille      Initial Encounter Date:  Flowsheet Row Pulmonary Rehab Walk Test from 10/01/2015 in [facility]  Date  10/01/15  Referring Provider  Dr. Nelda Marseille      Visit Diagnosis: Chronic obstructive pulmonary disease, unspecified COPD type ([place])  Patient's Home Medications on Admission:   Current Outpatient Prescriptions:  .  albuterol (PROVENTIL HFA;VENTOLIN HFA) 108 (90 Base) MCG/ACT inhaler, Inhale 1-2 puffs into the lungs every 6 (six) hours as needed for wheezing or shortness of breath., Disp: , Rfl:  .  Dextromethorphan-Guaifenesin 5-100 MG/5ML LIQD, Take 5 mLs by mouth every 4 (four) hours as needed., Disp: , Rfl:  .  ipratropium-albuterol (DUONEB) 0.5-2.5 (3) MG/3ML SOLN, Take 3 mLs by nebulization every 6 (six) hours as needed., Disp: , Rfl:  .  mometasone (ASMANEX) 220 MCG/INH inhaler, Inhale 2 puffs into the lungs daily., Disp: , Rfl:  .  OXYGEN, Inhale 2 L into the lungs daily as needed (for exertion). Reported on 04/19/2015, Disp: , Rfl:  .  predniSONE (DELTASONE) 10 MG tablet, Take 5 mg by mouth 2 (two) times daily with a meal. Takes as needed per [place], Disp: , Rfl:  .  Tiotropium Bromide-Olodaterol (STIOLTO RESPIMAT) 2.5-2.5 MCG/ACT AERS, Inhale 2 puffs into the lungs daily., Disp: , Rfl:   Past Medical History: Past Medical History:  Diagnosis Date  . Asthma   . COPD (chronic obstructive pulmonary disease) ([place])    pt states due to [place] contracted COPD  . Osteoporosis     Tobacco Use: History  Smoking Status  . Never Smoker  Smokeless Tobacco  . Never Used    Labs: Recent Review Flowsheet Data    There is no flowsheet data to display.      Capillary  Blood Glucose: No results found for: GLUCAP   ADL UCSD:   Pulmonary Function Assessment:     Pulmonary Function Assessment - 09/27/15 1117      Breath   Bilateral Breath Sounds Clear   Shortness of Breath Yes;Limiting activity;Panic with Shortness of Breath      Exercise Target Goals:    Exercise Program Goal: Individual exercise prescription set with THRR, safety & activity barriers. Participant demonstrates ability to understand and report RPE using BORG scale, to self-measure pulse accurately, and to acknowledge the importance of the exercise prescription.  Exercise Prescription Goal: Starting with aerobic activity 30 plus minutes a day, 3 days per week for initial exercise prescription. Provide home exercise prescription and guidelines that participant acknowledges understanding prior to discharge.  Activity Barriers & Risk Stratification:     Activity Barriers & Cardiac Risk Stratification - 09/27/15 1116      Activity Barriers & Cardiac Risk Stratification   Activity Barriers Deconditioning;Arthritis;Shortness of Breath      6 Minute Walk:     6 Minute Walk    Row Name 10/01/15 1631         6 Minute Walk   Phase Initial     Distance 1460 feet     Walk Time 6 minutes     # of Rest Breaks 0     MPH 2.76     METS 3.07     RPE 11  Perceived Dyspnea  1     Symptoms No     Resting HR 92 bpm     Resting BP 100/62     Max Ex. HR 97 bpm     Max Ex. BP 104/60     2 Minute Post BP 104/60       Interval HR   Baseline HR 92     1 Minute HR 95     2 Minute HR 90     3 Minute HR 97     4 Minute HR 93     5 Minute HR 91     6 Minute HR 93     2 Minute Post HR 88     Interval Heart Rate? Yes       Interval Oxygen   Interval Oxygen? Yes     Baseline Oxygen Saturation % 92 %     Baseline Liters of Oxygen 0 L     1 Minute Oxygen Saturation % 88 %     1 Minute Liters of Oxygen 0 L     2 Minute Oxygen Saturation % 88 %     2 Minute Liters of Oxygen 0  L     3 Minute Oxygen Saturation % 91 %     3 Minute Liters of Oxygen 0 L     4 Minute Oxygen Saturation % 92 %     4 Minute Liters of Oxygen 0 L     5 Minute Oxygen Saturation % 91 %     5 Minute Liters of Oxygen 0 L     6 Minute Oxygen Saturation % 94 %     6 Minute Liters of Oxygen 0 L     2 Minute Post Oxygen Saturation % 92 %     2 Minute Post Liters of Oxygen 0 L        Initial Exercise Prescription:     Initial Exercise Prescription - 10/01/15 1600      Date of Initial Exercise RX and Referring Provider   Date 10/01/15   Referring Provider Dr. Nelda Marseille     NuStep   Level 2   Minutes 17   METs 1.7     Rower   Level 1   Minutes 17     Track   Laps 8   Minutes 17     Prescription Details   Frequency (times per week) 2   Duration Progress to 45 minutes of aerobic exercise without signs/symptoms of physical distress     Intensity   THRR 40-80% of Max Heartrate 62-124   Ratings of Perceived Exertion 11-13   Perceived Dyspnea 0-4     Progression   Progression Continue progressive overload as per policy without signs/symptoms or physical distress.     Resistance Training   Training Prescription Yes   Weight orange bands   Reps 10-12      Perform Capillary Blood Glucose checks as needed.  Exercise Prescription Changes:     Exercise Prescription Changes    Row Name 10/08/15 1600 10/10/15 1600 10/15/15 1529 10/17/15 1449 10/29/15 1500     Exercise Review   Progression  -  - Yes  -  -     Response to Exercise   Blood Pressure (Admit) 104/60 120/62 110/60 100/60 96/64   Blood Pressure (Exercise) 98/60 112/60 120/60 110/60 108/60   Blood Pressure (Exit) 100/60 106/60 108/64 110/66 104/60   Heart Rate (Admit) 79 bpm 77 bpm 91  bpm 91 bpm 83 bpm   Heart Rate (Exercise) 109 bpm 110 bpm 104 bpm 110 bpm 104 bpm   Heart Rate (Exit) 90 bpm 77 bpm 88 bpm 90 bpm 81 bpm   Oxygen Saturation (Admit) 91 % 93 % 91 % 93 % 93 %   Oxygen Saturation (Exercise) 89 % 92 %  94 % 90 % 92 %   Oxygen Saturation (Exit) 93 % 94 % 93 % 92 % 93 %   Rating of Perceived Exertion (Exercise) 13 13 15 12 13    Perceived Dyspnea (Exercise) 1 2 3 2 2    Duration Progress to 45 minutes of aerobic exercise without signs/symptoms of physical distress Progress to 45 minutes of aerobic exercise without signs/symptoms of physical distress Progress to 45 minutes of aerobic exercise without signs/symptoms of physical distress Progress to 45 minutes of aerobic exercise without signs/symptoms of physical distress Progress to 45 minutes of aerobic exercise without signs/symptoms of physical distress   Intensity THRR unchanged THRR unchanged THRR unchanged THRR unchanged THRR unchanged     Progression   Progression Continue to progress workloads to maintain intensity without signs/symptoms of physical distress. Continue to progress workloads to maintain intensity without signs/symptoms of physical distress. Continue to progress workloads to maintain intensity without signs/symptoms of physical distress. Continue to progress workloads to maintain intensity without signs/symptoms of physical distress. Continue to progress workloads to maintain intensity without signs/symptoms of physical distress.     Resistance Training   Training Prescription Yes Yes Yes Yes Yes   Weight orange bands orange bands orange bands orange bands orange bands   Reps 10-12  10 minutes of strength training 10-12  10 minutes of strength training 10-12  10 minutes of strength training 10-12  10 minutes of strength training 10-12  10 minutes of strength training     Interval Training   Interval Training No No No No No     NuStep   Level 2  - 3 3 3    Minutes 17  - 17 17 17    METs 2.1  - 2.3 1.9 1.8     Rower   Level 1 1 1   - 1   Minutes 17 17 17   - 17     Track   Laps 20 14 26 16 20    Minutes 17 17 17 17 17    Row Name 10/31/15 1500 11/05/15 1500 11/12/15 1400 11/14/15 1500 11/19/15 1500     Exercise Review    Progression Yes  - Yes  -  -     Response to Exercise   Blood Pressure (Admit) 104/60 126/68 100/64 104/64 104/60   Blood Pressure (Exercise) 126/70 128/70 164/80 110/60 120/76   Blood Pressure (Exit) 104/56 116/60 100/74 84/62  recheck BP 114/52 96/60  recheck BP 114/52   Heart Rate (Admit) 83 bpm 83 bpm 80 bpm 80 bpm 71 bpm   Heart Rate (Exercise) 95 bpm 97 bpm 111 bpm 103 bpm 99 bpm   Heart Rate (Exit) 78 bpm 89 bpm 87 bpm 76 bpm 88 bpm   Oxygen Saturation (Admit) 93 % 93 % 92 % 91 % 92 %   Oxygen Saturation (Exercise) 95 % 92 % 88 % 90 % 93 %   Oxygen Saturation (Exit) 92 % 93 % 97 % 90 % 93 %   Rating of Perceived Exertion (Exercise) 13 17 13 13 13    Perceived Dyspnea (Exercise) 3 3 2 3 2    Duration Progress to 45 minutes  of aerobic exercise without signs/symptoms of physical distress Progress to 45 minutes of aerobic exercise without signs/symptoms of physical distress Progress to 45 minutes of aerobic exercise without signs/symptoms of physical distress Progress to 45 minutes of aerobic exercise without signs/symptoms of physical distress Progress to 45 minutes of aerobic exercise without signs/symptoms of physical distress   Intensity THRR unchanged THRR unchanged THRR unchanged THRR unchanged THRR unchanged     Progression   Progression Continue to progress workloads to maintain intensity without signs/symptoms of physical distress. Continue to progress workloads to maintain intensity without signs/symptoms of physical distress. Continue to progress workloads to maintain intensity without signs/symptoms of physical distress. Continue to progress workloads to maintain intensity without signs/symptoms of physical distress. Continue to progress workloads to maintain intensity without signs/symptoms of physical distress.     Resistance Training   Training Prescription Yes Yes Yes Yes Yes   Weight orange bands orange bands orange bands orange bands orange bands   Reps 10-12  10  minutes of strength training 10-12  10 minutes of strength training 10-12  10 minutes of strength training 10-12  10 minutes of strength training 10-12  10 minutes of strength training     Interval Training   Interval Training No No No No No     NuStep   Level 5 5 6 6 6    Minutes 17 17 17 17 17    METs 1.7 2.5 4 3.6 4.4     Rower   Level 1 2 3   - 3   Minutes 17 17 17   - 17     Track   Laps  - 15 26 11 23    Minutes  - 17 17 17 17      Home Exercise Plan   Plans to continue exercise at  -  -  - Home  -   Frequency  -  -  - Add 3 additional days to program exercise sessions.  -   Row Name 11/21/15 1500 11/28/15 1600 12/03/15 1600 12/05/15 1600       Response to Exercise   Blood Pressure (Admit) 104/56 110/60 96/50 100/58    Blood Pressure (Exercise) 130/78 140/80 112/60 126/80    Blood Pressure (Exit) 110/60 100/60 100/58 92/60    Heart Rate (Admit) 74 bpm 74 bpm 78 bpm 82 bpm    Heart Rate (Exercise) 108 bpm 99 bpm 102 bpm 104 bpm    Heart Rate (Exit) 99 bpm 79 bpm 79 bpm 80 bpm    Oxygen Saturation (Admit) 91 % 93 % 93 % 93 %    Oxygen Saturation (Exercise) 92 % 91 % 92 % 92 %    Oxygen Saturation (Exit) 94 % 92 % 93 % 93 %    Rating of Perceived Exertion (Exercise) 13 14 14 14     Perceived Dyspnea (Exercise) 3 2 2 1     Duration Progress to 45 minutes of aerobic exercise without signs/symptoms of physical distress Progress to 45 minutes of aerobic exercise without signs/symptoms of physical distress Progress to 45 minutes of aerobic exercise without signs/symptoms of physical distress Progress to 45 minutes of aerobic exercise without signs/symptoms of physical distress    Intensity THRR unchanged THRR unchanged THRR unchanged THRR unchanged      Progression   Progression Continue to progress workloads to maintain intensity without signs/symptoms of physical distress. Continue to progress workloads to maintain intensity without signs/symptoms of physical distress. Continue to  progress workloads to maintain intensity without signs/symptoms of  physical distress. Continue to progress workloads to maintain intensity without signs/symptoms of physical distress.      Resistance Training   Training Prescription Yes Yes Yes Yes    Weight orange bands orange bands orange bands orange bands    Reps 10-12  10 minutes of strength training 10-12  10 minutes of strength training 10-12  10 minutes of strength training 10-12  10 minutes of strength training      Interval Training   Interval Training No No No No      NuStep   Level 6  -  -  -    Minutes 17  -  -  -    METs 4.3  -  -  -      Elliptical   Level  - 2 2  -    Minutes  - 17 17  -      Rower   Level 3  - 3 3    Minutes 17  - 17 17      Track   Laps  - 15 24 19     Minutes  - 17 17 17        Exercise Comments:     Exercise Comments    Row Name 10/10/15 0830 11/11/15 1402 11/14/15 1527 12/09/15 1145     Exercise Comments Patient has only attended one exercise session. First session went very well. Will cont. to monitor and progress.  Patient is progressing well in workloads. Will cont. to monitor. Home exercise complete Patient is progressing well. Is now utilizing the elliptical which she finds challenging but rewarding. Will cont. to monitor.        Discharge Exercise Prescription (Final Exercise Prescription Changes):     Exercise Prescription Changes - 12/05/15 1600      Response to Exercise   Blood Pressure (Admit) 100/58   Blood Pressure (Exercise) 126/80   Blood Pressure (Exit) 92/60   Heart Rate (Admit) 82 bpm   Heart Rate (Exercise) 104 bpm   Heart Rate (Exit) 80 bpm   Oxygen Saturation (Admit) 93 %   Oxygen Saturation (Exercise) 92 %   Oxygen Saturation (Exit) 93 %   Rating of Perceived Exertion (Exercise) 14   Perceived Dyspnea (Exercise) 1   Duration Progress to 45 minutes of aerobic exercise without signs/symptoms of physical distress   Intensity THRR unchanged      Progression   Progression Continue to progress workloads to maintain intensity without signs/symptoms of physical distress.     Resistance Training   Training Prescription Yes   Weight orange bands   Reps 10-12  10 minutes of strength training     Interval Training   Interval Training No     Rower   Level 3   Minutes 17     Track   Laps 19   Minutes 17       Nutrition:  Target Goals: Understanding of nutrition guidelines, daily intake of sodium 1500mg , cholesterol 200mg , calories 30% from fat and 7% or less from saturated fats, daily to have 5 or more servings of fruits and vegetables.  Biometrics:     Pre Biometrics - 09/27/15 1118      Pre Biometrics   Grip Strength 39 kg       Nutrition Therapy Plan and Nutrition Goals:     Nutrition Therapy & Goals - 10/29/15 1504      Nutrition Therapy   Diet High Calorie, High Protein diet  Personal Nutrition Goals   Personal Goal #1 1-2 lb wt gain/week to a goal wt gain goal of 6-12 lb or a weight of 140-143 lb at graduation from [place], educate and counsel regarding individualized specific dietary modifications aiming towards targeted core components such as weight, hypertension, lipid management, diabetes, heart failure and other comorbidities.;Nutrition handout(s) given to patient.  High Calorie, High Protein diet; Suggestions for increasing calories and protein; High Calorie, High Protein recipes   Expected Outcomes Short Term Goal: A plan has been developed with personal nutrition goals set during dietitian appointment.;Long Term Goal: Adherence to prescribed nutrition plan.      Nutrition Discharge: Rate Your Plate Scores:     Nutrition Assessments - 10/29/15 1504      Rate Your Plate Scores   Pre Score --  returned incomplete. Goal is for pt to gain wt.       Psychosocial: Target Goals: Acknowledge presence or absence of depression, maximize  coping skills, provide positive support system. Participant is able to verbalize types and ability to use techniques and skills needed for reducing stress and depression.  Initial Review & Psychosocial Screening:     Initial Psych Review & Screening - 09/27/15 [place]? Yes     Barriers   Psychosocial barriers to participate in program There are no identifiable barriers or psychosocial needs.     Screening Interventions   Interventions Encouraged to exercise      Quality of Life Scores:   PHQ-9: Recent Review Flowsheet Data    Depression screen [facility] 2/9 09/27/2015   Decreased Interest 0   Down, Depressed, Hopeless 0   PHQ - 2 Score 0      Psychosocial Evaluation and Intervention:     Psychosocial Evaluation - 09/27/15 1122      Psychosocial Evaluation & Interventions   Interventions Encouraged to exercise with the program and follow exercise prescription      Psychosocial Re-Evaluation:     Psychosocial Re-Evaluation    [place] Name 10/10/15 0850 11/07/15 0812 12/09/15 0842         Psychosocial Re-Evaluation   Interventions Encouraged to attend Pulmonary Rehabilitation for the exercise Encouraged to attend Pulmonary Rehabilitation for the exercise Encouraged to attend Pulmonary Rehabilitation for the exercise     Comments no psychosocial issues identified at this time None identified at this time Continues to have no psychosocial issues at this time.     Continued Psychosocial Services Needed No No No       Education: Education Goals: Education classes will be provided on a weekly basis, covering required topics. Participant will state understanding/return demonstration of topics presented.  Learning Barriers/Preferences:     Learning Barriers/Preferences - 09/27/15 1116      Learning Barriers/Preferences   Learning Barriers None   Learning Preferences Written Material;Computer/Internet;Verbal Instruction;Audio       Education Topics: Risk Factor Reduction:  -Group instruction that is supported by a PowerPoint presentation. Instructor discusses the definition of a risk factor, different risk factors for pulmonary disease, and how the heart and lungs work together.     Nutrition for Pulmonary Patient:  -Group instruction provided by PowerPoint slides, verbal discussion, and written materials to support subject matter. The instructor gives an explanation and review of healthy diet recommendations, which includes a discussion on weight management, recommendations for fruit and vegetable consumption, as well as protein,  fluid, caffeine, fiber, sodium, sugar, and alcohol. Tips for eating when patients are short of breath are discussed. Flowsheet Row PULMONARY REHAB CHRONIC OBSTRUCTIVE PULMONARY DISEASE from 12/05/2015 in [place]  Date  10/10/15  Educator  RD  Instruction Review Code  2- meets goals/outcomes      Pursed Lip Breathing:  -Group instruction that is supported by demonstration and informational handouts. Instructor discusses the benefits of pursed lip and diaphragmatic breathing and detailed demonstration on how to preform both.   Flowsheet Row PULMONARY REHAB CHRONIC OBSTRUCTIVE PULMONARY DISEASE from 12/05/2015 in [place]  Date  11/07/15  Educator  RT  Instruction Review Code  2- meets goals/outcomes      Oxygen Safety:  -Group instruction provided by PowerPoint, verbal discussion, and written material to support subject matter. There is an overview of "What is Oxygen" and "Why do we need it".  Instructor also reviews how to create a safe environment for oxygen use, the importance of using oxygen as prescribed, and the risks of noncompliance. There is a brief discussion on traveling with oxygen and resources the patient may utilize.   Oxygen Equipment:  -Group instruction provided by [facility] Staff utilizing handouts,  written materials, and equipment demonstrations.   Signs and Symptoms:  -Group instruction provided by written material and verbal discussion to support subject matter. Warning signs and symptoms of infection, stroke, and heart attack are reviewed and when to call the physician/911 reinforced. Tips for preventing the spread of infection discussed. Flowsheet Row PULMONARY REHAB CHRONIC OBSTRUCTIVE PULMONARY DISEASE from 12/05/2015 in [place]  Date  11/28/15  Educator  rn  Instruction Review Code  2- meets goals/outcomes      Advanced Directives:  -Group instruction provided by verbal instruction and written material to support subject matter. Instructor reviews Advanced Directive laws and proper instruction for filling out document.   Pulmonary Video:  -Group video education that reviews the importance of medication and oxygen compliance, exercise, good nutrition, pulmonary hygiene, and pursed lip and diaphragmatic breathing for the pulmonary patient. Flowsheet Row PULMONARY REHAB CHRONIC OBSTRUCTIVE PULMONARY DISEASE from 10/31/2015 in [place]  Date  10/31/15  Instruction Review Code  2- meets goals/outcomes      Exercise for the Pulmonary Patient:  -Group instruction that is supported by a PowerPoint presentation. Instructor discusses benefits of exercise, core components of exercise, frequency, duration, and intensity of an exercise routine, importance of utilizing pulse oximetry during exercise, safety while exercising, and options of places to exercise outside of rehab.   Flowsheet Row PULMONARY REHAB CHRONIC OBSTRUCTIVE PULMONARY DISEASE from 12/05/2015 in [place]  Date  11/21/15  Educator  EP  Instruction Review Code  2- meets goals/outcomes      Pulmonary Medications:  -Verbally interactive group education provided by instructor with focus on inhaled medications and proper  administration.   Anatomy and Physiology of the Respiratory System and Intimacy:  -Group instruction provided by PowerPoint, verbal discussion, and written material to support subject matter. Instructor reviews respiratory cycle and anatomical components of the respiratory system and their functions. Instructor also reviews differences in obstructive and restrictive respiratory diseases with examples of each. Intimacy, Sex, and Sexuality differences are reviewed with a discussion on how relationships can change when diagnosed with pulmonary disease. Common sexual concerns are reviewed.   Knowledge Questionnaire Score:   Core Components/Risk Factors/Patient Goals at Admission:  Personal Goals and Risk Factors at Admission - 09/27/15 1120      Core Components/Risk Factors/Patient Goals on Admission   Increase Strength and Stamina Yes   Intervention Provide advice, education, support and counseling about physical activity/exercise needs.;Develop an individualized exercise prescription for aerobic and resistive training based on initial evaluation findings, risk stratification, comorbidities and participant's personal goals.   Expected Outcomes Achievement of increased cardiorespiratory fitness and enhanced flexibility, muscular endurance and strength shown through measurements of functional capacity and personal statement of participant.   Improve shortness of breath with ADL's Yes   Intervention Provide education, individualized exercise plan and daily activity instruction to help decrease symptoms of SOB with activities of daily living.   Expected Outcomes Short Term: Achieves a reduction of symptoms when performing activities of daily living.   Develop more efficient breathing techniques such as purse lipped breathing and diaphragmatic breathing; and practicing self-pacing with activity Yes   Intervention Provide education, demonstration and support about specific breathing techniuqes  utilized for more efficient breathing. Include techniques such as pursed lipped breathing, diaphragmatic breathing and self-pacing activity.   Expected Outcomes Short Term: Participant will be able to demonstrate and use breathing techniques as needed throughout daily activities.   Increase knowledge of respiratory medications and ability to use respiratory devices properly  Yes   Intervention Provide education and demonstration as needed of appropriate use of medications, inhalers, and oxygen therapy.   Expected Outcomes Short Term: Achieves understanding of medications use. Understands that oxygen is a medication prescribed by physician. Demonstrates appropriate use of inhaler and oxygen therapy.      Core Components/Risk Factors/Patient Goals Review:      Goals and Risk Factor Review    Row Name 10/10/15 0848 11/07/15 0810 12/09/15 0840         Core Components/Risk Factors/Patient Goals Review   Personal Goals Review  - Develop more efficient breathing techniques such as purse lipped breathing and diaphragmatic breathing and practicing self-pacing with activity.;Increase Strength and Stamina;Improve shortness of breath with ADL's;Increase knowledge of respiratory medications and ability to use respiratory devices properly. Improve shortness of breath with ADL's;Increase Strength and Stamina     Review Has only exercised in 1 session, too early to meet any goals Progressing well despite COPD exacerbation, on second round of antibiotics, nustep level 5, track 15 laps, rower level 2. Continues to progress, up to 19 laps on track, rower level 3, nustep level 6.  Attendance regular.     Expected Outcomes Should see improvement by the next review Progressing well Continue to increase workloads as tolerated.        Core Components/Risk Factors/Patient Goals at Discharge (Final Review):      Goals and Risk Factor Review - 12/09/15 0840      Core Components/Risk Factors/Patient Goals Review    Personal Goals Review Improve shortness of breath with ADL's;Increase Strength and Stamina   Review Continues to progress, up to 19 laps on track, rower level 3, nustep level 6.  Attendance regular.   Expected Outcomes Continue to increase workloads as tolerated.      ITP Comments:   Comments: ITP REVIEW Pt is making expected progress toward pulmonary rehab goals after completing 15 sessions. Recommend continued exercise, life style modification, education, and utilization of breathing techniques to increase stamina and strength and decrease shortness of breath with exertion.

## 2015-12-10 NOTE — Progress Notes (Signed)
Daily Session Note  Patient Details  Name: Patricia Johnston MRN: [id number] Date of Birth: 1950/04/09 Referring Provider:   April Manson Pulmonary Rehab Walk Test from 10/01/2015 in [place]  Referring Provider  Dr. Nelda Marseille      Encounter Date: 12/10/2015  Check In:     Session Check In - 12/10/15 1346      Check-In   Location [facility]   Staff Present Rosebud Poles, RN, BSN;Taino Maertens Ysidro Evert, RN;Molly diVincenzo, MS, ACSM RCEP, Exercise Physiologist;Portia Rollene Rotunda, RN, BSN   Supervising physician immediately available to respond to emergencies [facility] immediately available   Physician(s) Dr. Waldron Labs   Medication changes reported     No   Fall or balance concerns reported    No   Warm-up and Cool-down Performed as group-led instruction   Resistance Training Performed Yes   VAD Patient? No     Pain Assessment   Currently in Pain? No/denies   Multiple Pain Sites No      Capillary Blood Glucose: No results found for this or any previous visit (from the past 24 hour(s)).      Exercise Prescription Changes - 12/10/15 1500      Response to Exercise   Blood Pressure (Admit) 100/50   Blood Pressure (Exercise) 120/70   Blood Pressure (Exit) 108/56   Heart Rate (Admit) 84 bpm   Heart Rate (Exercise) 107 bpm   Heart Rate (Exit) 78 bpm   Oxygen Saturation (Admit) 93 %   Oxygen Saturation (Exercise) 90 %   Oxygen Saturation (Exit) 93 %   Rating of Perceived Exertion (Exercise) 17   Perceived Dyspnea (Exercise) 3.5   Duration Progress to 45 minutes of aerobic exercise without signs/symptoms of physical distress   Intensity THRR unchanged     Progression   Progression Continue to progress workloads to maintain intensity without signs/symptoms of physical distress.     Resistance Training   Training Prescription Yes   Weight orange bands   Reps 10-12  10 minutes of strength training     Interval Training   Interval  Training No     Elliptical   Level 2   Minutes 17     Rower   Level 3   Minutes 17     Track   Laps 20   Minutes 17     Goals Met:  Exercise tolerated well No report of cardiac concerns or symptoms Strength training completed today  Goals Unmet:  Not Applicable  Comments: Service time is from 1330 to 1455    Dr. Rush Farmer is Medical Director for Pulmonary Rehab at [facility].

## 2015-12-12 ENCOUNTER — Encounter (HOSPITAL_COMMUNITY)
Admission: RE | Admit: 2015-12-12 | Discharge: 2015-12-12 | Disposition: A | Discharge status: Home / Self Care | Payer: Non-veteran care | Source: Ambulatory Visit | Attending: Pulmonary Disease | Admitting: Pulmonary Disease

## 2015-12-12 VITALS — Wt 126.5 lb

## 2015-12-12 DIAGNOSIS — J449 Chronic obstructive pulmonary disease, unspecified: Secondary | ICD-10-CM | POA: Diagnosis not present

## 2015-12-12 NOTE — Progress Notes (Signed)
Daily Session Note  Patient Details  Name: Patricia Johnston MRN: [id number] Date of Birth: Aug 20, 1950 Referring Provider:   April Manson Pulmonary Rehab Walk Test from 10/01/2015 in [facility]  Referring Provider  Dr. Nelda Marseille      Encounter Date: 12/12/2015  Check In:     Session Check In - 12/12/15 1340      Check-In   Location [facility]   Staff Present Rosebud Poles, RN, BSN;Molly diVincenzo, MS, ACSM RCEP, Exercise Physiologist;Lisa Ysidro Evert, RN;Jerrie Gullo Rollene Rotunda, RN, BSN   Supervising physician immediately available to respond to emergencies [facility] immediately available   Physician(s) Dr. Alfredia Ferguson   Medication changes reported     No   Fall or balance concerns reported    No   Warm-up and Cool-down Performed as group-led instruction   Resistance Training Performed Yes   VAD Patient? No     Pain Assessment   Currently in Pain? No/denies   Multiple Pain Sites No      Capillary Blood Glucose: No results found for this or any previous visit (from the past 24 hour(s)).      Exercise Prescription Changes - 12/12/15 1534      Response to Exercise   Blood Pressure (Admit) 110/54   Blood Pressure (Exercise) 140/80   Blood Pressure (Exit) 110/60   Heart Rate (Admit) 79 bpm   Heart Rate (Exercise) 108 bpm   Heart Rate (Exit) 82 bpm   Oxygen Saturation (Admit) 93 %   Oxygen Saturation (Exercise) 88 %   Oxygen Saturation (Exit) 91 %   Rating of Perceived Exertion (Exercise) 13   Perceived Dyspnea (Exercise) 3   Duration Progress to 45 minutes of aerobic exercise without signs/symptoms of physical distress   Intensity THRR unchanged     Progression   Progression Continue to progress workloads to maintain intensity without signs/symptoms of physical distress.     Resistance Training   Training Prescription Yes   Weight orange bands   Reps 10-12  10 minutes of strength training     Interval Training   Interval  Training No     Elliptical   Level 2   Speed 1   Minutes 17     Track   Laps 16   Minutes 17     Goals Met:  Independence with exercise equipment Improved SOB with ADL's Using PLB without cueing & demonstrates good technique Exercise tolerated well No report of cardiac concerns or symptoms Strength training completed today  Goals Unmet:  Not Applicable  Comments: Service time is from 1330 to 1515   Dr. Rush Farmer is Medical Director for Pulmonary Rehab at [facility].

## 2015-12-17 ENCOUNTER — Encounter (HOSPITAL_COMMUNITY)
Admission: RE | Admit: 2015-12-17 | Discharge: 2015-12-17 | Disposition: A | Discharge status: Home / Self Care | Payer: Non-veteran care | Source: Ambulatory Visit | Attending: Pulmonary Disease | Admitting: Pulmonary Disease

## 2015-12-17 VITALS — Wt 127.4 lb

## 2015-12-17 DIAGNOSIS — J449 Chronic obstructive pulmonary disease, unspecified: Secondary | ICD-10-CM | POA: Diagnosis not present

## 2015-12-17 NOTE — Progress Notes (Signed)
Daily Session Note  Patient Details  Name: Patricia Johnston MRN: [id number] Date of Birth: 04-Feb-1951 Referring Provider:   April Manson Pulmonary Rehab Walk Test from 10/01/2015 in [place]  Referring Provider  Dr. Nelda Marseille      Encounter Date: 12/17/2015  Check In:     Session Check In - 12/17/15 1548      Check-In   Location [facility]   Staff Present Rosebud Poles, RN, BSN;Molly diVincenzo, MS, ACSM RCEP, Exercise Physiologist;Prosperity Darrough Rollene Rotunda, RN, BSN   Supervising physician immediately available to respond to emergencies [facility] immediately available   Physician(s) Dr. Alfredia Ferguson   Medication changes reported     No   Fall or balance concerns reported    No   Warm-up and Cool-down Performed as group-led instruction   Resistance Training Performed Yes   VAD Patient? No     Pain Assessment   Currently in Pain? No/denies   Multiple Pain Sites No      Capillary Blood Glucose: No results found for this or any previous visit (from the past 24 hour(s)).      Exercise Prescription Changes - 12/17/15 1549      Response to Exercise   Blood Pressure (Admit) 98/50   Blood Pressure (Exercise) 110/66   Blood Pressure (Exit) 102/60   Heart Rate (Admit) 82 bpm   Heart Rate (Exercise) 111 bpm   Heart Rate (Exit) 76 bpm   Oxygen Saturation (Admit) 94 %   Oxygen Saturation (Exercise) 90 %   Oxygen Saturation (Exit) 90 %   Rating of Perceived Exertion (Exercise) 13   Perceived Dyspnea (Exercise) 2   Duration Progress to 45 minutes of aerobic exercise without signs/symptoms of physical distress   Intensity THRR unchanged     Progression   Progression Continue to progress workloads to maintain intensity without signs/symptoms of physical distress.     Resistance Training   Training Prescription Yes   Weight orange bands   Reps 10-12  10 minutes of strength training     Interval Training   Interval Training No     Elliptical   Level 2   Speed 1   Minutes 17     Rower   Level 3   Minutes 17     Track   Laps 21   Minutes 17     Goals Met:  Independence with exercise equipment Improved SOB with ADL's Using PLB without cueing & demonstrates good technique Exercise tolerated well No report of cardiac concerns or symptoms Strength training completed today  Goals Unmet:  Not Applicable  Comments: Service time is from 1330 to 1510   Dr. Rush Farmer is Medical Director for Pulmonary Rehab at [facility].

## 2015-12-19 ENCOUNTER — Encounter (HOSPITAL_COMMUNITY)
Admission: RE | Admit: 2015-12-19 | Discharge: 2015-12-19 | Disposition: A | Discharge status: Home / Self Care | Payer: Non-veteran care | Source: Ambulatory Visit | Attending: Pulmonary Disease | Admitting: Pulmonary Disease

## 2015-12-19 VITALS — Wt 125.9 lb

## 2015-12-19 DIAGNOSIS — J449 Chronic obstructive pulmonary disease, unspecified: Secondary | ICD-10-CM

## 2015-12-19 NOTE — Progress Notes (Signed)
Daily Session Note  Patient Details  Name: Patricia Johnston MRN: [id number] Date of Birth: 04-05-1950 Referring Provider:   April Manson Pulmonary Rehab Walk Test from 10/01/2015 in [place]  Referring Provider  Dr. Nelda Marseille      Encounter Date: 12/19/2015  Check In:     Session Check In - 12/19/15 1331      Check-In   [facility]   Staff Present Su Hilt, MS, ACSM RCEP, Exercise Physiologist;Joan Leonia Reeves, RN, Luisa Hart, RN, BSN   Supervising physician immediately available to respond to emergencies [facility] immediately available   Physician(s) Dr. Rockne Menghini   Medication changes reported     No   Fall or balance concerns reported    No   Warm-up and Cool-down Performed as group-led instruction   Resistance Training Performed Yes   VAD Patient? No     Pain Assessment   Currently in Pain? No/denies   Multiple Pain Sites No      Capillary Blood Glucose: No results found for this or any previous visit (from the past 24 hour(s)).      Exercise Prescription Changes - 12/19/15 1539      Response to Exercise   Blood Pressure (Admit) 104/56   Blood Pressure (Exercise) 106/62   Blood Pressure (Exit) 104/58   Heart Rate (Admit) 81 bpm   Heart Rate (Exercise) 112 bpm   Heart Rate (Exit) 96 bpm   Oxygen Saturation (Admit) 91 %   Oxygen Saturation (Exercise) 92 %   Oxygen Saturation (Exit) 91 %   Rating of Perceived Exertion (Exercise) 13   Perceived Dyspnea (Exercise) 1   Duration Progress to 45 minutes of aerobic exercise without signs/symptoms of physical distress   Intensity THRR unchanged     Progression   Progression Continue to progress workloads to maintain intensity without signs/symptoms of physical distress.     Resistance Training   Training Prescription Yes   Weight orange bands   Reps 10-12  10 minutes of strength training     Interval Training   Interval Training No     Elliptical   Level 2   Speed 1   Minutes 17     Rower   Level 3   Minutes 17     Goals Met:  Independence with exercise equipment Improved SOB with ADL's Using PLB without cueing & demonstrates good technique Exercise tolerated well No report of cardiac concerns or symptoms Strength training completed today  Goals Unmet:  Not Applicable  Comments: Service time is from 1330 to 1515   Dr. Rush Farmer is Medical Director for Pulmonary Rehab at [facility].

## 2015-12-20 ENCOUNTER — Encounter: Payer: Self-pay | Admitting: Internal Medicine

## 2015-12-24 ENCOUNTER — Encounter (HOSPITAL_COMMUNITY)
Admission: RE | Admit: 2015-12-24 | Discharge: 2015-12-24 | Disposition: A | Discharge status: Home / Self Care | Payer: Non-veteran care | Source: Ambulatory Visit | Attending: Pulmonary Disease | Admitting: Pulmonary Disease

## 2015-12-24 VITALS — Wt 126.5 lb

## 2015-12-24 DIAGNOSIS — J449 Chronic obstructive pulmonary disease, unspecified: Secondary | ICD-10-CM

## 2015-12-24 NOTE — Progress Notes (Signed)
Daily Session Note  Patient Details  Name: Patricia Johnston MRN: [id number] Date of Birth: [birth year] Referring Provider:   April Manson Pulmonary Rehab Walk Test from 10/01/2015 in [place]  Referring Provider  Dr. Nelda Marseille      Encounter Date: 12/24/2015  Check In:     Session Check In - 12/24/15 1336      Check-In   [facility]   Staff Present Su Hilt, MS, ACSM RCEP, Exercise Physiologist;Joan Leonia Reeves, RN, BSN;Lisa Ysidro Evert, RN;Bradly Sangiovanni Rollene Rotunda, RN, BSN   Supervising physician immediately available to respond to emergencies [facility] immediately available   Physician(s) Dr. Rockne Menghini   Medication changes reported     No   Fall or balance concerns reported    No   Warm-up and Cool-down Performed as group-led instruction   Resistance Training Performed Yes   VAD Patient? No     Pain Assessment   Currently in Pain? No/denies   Multiple Pain Sites No      Capillary Blood Glucose: No results found for this or any previous visit (from the past 24 hour(s)).      Exercise Prescription Changes - 12/24/15 1606      Response to Exercise   Blood Pressure (Admit) 106/70   Blood Pressure (Exercise) 110/60   Blood Pressure (Exit) 100/54   Heart Rate (Admit) 77 bpm   Heart Rate (Exercise) 104 bpm   Heart Rate (Exit) 78 bpm   Oxygen Saturation (Admit) 93 %   Oxygen Saturation (Exercise) 91 %   Oxygen Saturation (Exit) 93 %   Rating of Perceived Exertion (Exercise) 13   Perceived Dyspnea (Exercise) 3   Duration Progress to 45 minutes of aerobic exercise without signs/symptoms of physical distress   Intensity THRR unchanged     Progression   Progression Continue to progress workloads to maintain intensity without signs/symptoms of physical distress.     Resistance Training   Training Prescription Yes   Weight orange bands   Reps 10-12  10 minutes of strength training     Interval Training   Interval  Training No     Elliptical   Level 2   Speed 1   Minutes 17     Rower   Level 3   Minutes 17     Track   Laps 20   Minutes 17     Goals Met:  Independence with exercise equipment Improved SOB with ADL's Using PLB without cueing & demonstrates good technique Exercise tolerated well No report of cardiac concerns or symptoms Strength training completed today  Goals Unmet:  Not Applicable  Comments: Service time is from 1330 to 1500   Dr. Rush Farmer is Medical Director for Pulmonary Rehab at [facility].

## 2015-12-26 ENCOUNTER — Encounter (HOSPITAL_COMMUNITY)
Admission: RE | Admit: 2015-12-26 | Discharge: 2015-12-26 | Disposition: A | Discharge status: Home / Self Care | Payer: Non-veteran care | Source: Ambulatory Visit | Attending: Pulmonary Disease | Admitting: Pulmonary Disease

## 2015-12-26 VITALS — Wt 123.9 lb

## 2015-12-26 DIAGNOSIS — J449 Chronic obstructive pulmonary disease, unspecified: Secondary | ICD-10-CM

## 2015-12-26 NOTE — Progress Notes (Signed)
Daily Session Note  Patient Details  Name: Patricia Johnston MRN: [id number] Date of Birth: February 05, 1951 Referring Provider:   April Manson Pulmonary Rehab Walk Test from 10/01/2015 in [place]  Referring Provider  Dr. Nelda Marseille      Encounter Date: 12/26/2015  Check In:     Session Check In - 12/26/15 1336      Check-In   Location [facility]   Staff Present Su Hilt, MS, ACSM RCEP, Exercise Physiologist;Carys Malina Leonia Reeves, RN, BSN;Lisa Ysidro Evert, RN;Portia Rollene Rotunda, RN, BSN   Supervising physician immediately available to respond to emergencies [facility] immediately available   Physician(s) Dr. Alfredia Ferguson   Medication changes reported     No   Fall or balance concerns reported    No   Warm-up and Cool-down Performed as group-led instruction   Resistance Training Performed Yes   VAD Patient? No     Pain Assessment   Currently in Pain? No/denies   Multiple Pain Sites No      Capillary Blood Glucose: No results found for this or any previous visit (from the past 24 hour(s)).      Exercise Prescription Changes - 12/26/15 1600      Response to Exercise   Blood Pressure (Admit) 106/54   Blood Pressure (Exercise) 120/86   Blood Pressure (Exit) 98/56   Heart Rate (Admit) 82 bpm   Heart Rate (Exercise) 117 bpm   Heart Rate (Exit) 93 bpm   Oxygen Saturation (Admit) 94 %   Oxygen Saturation (Exercise) 91 %   Oxygen Saturation (Exit) 93 %   Rating of Perceived Exertion (Exercise) 13   Perceived Dyspnea (Exercise) 3   Duration Progress to 45 minutes of aerobic exercise without signs/symptoms of physical distress   Intensity THRR unchanged     Progression   Progression Continue to progress workloads to maintain intensity without signs/symptoms of physical distress.     Resistance Training   Training Prescription Yes   Weight orange bands   Reps 10-12  10 minutes of strength training     Interval Training   Interval  Training No     Elliptical   Level 2   Speed 1   Minutes 17     Track   Laps 20   Minutes 17     Goals Met:  Exercise tolerated well Strength training completed today  Goals Unmet:  Not Applicable  Comments: Service time is from 1330 to 1530.  Attended the question and answer session with Dr. Nelda Marseille, our medical director.    Dr. Rush Farmer is Medical Director for Pulmonary Rehab at [facility].

## 2015-12-31 ENCOUNTER — Encounter (HOSPITAL_COMMUNITY)
Admission: RE | Admit: 2015-12-31 | Discharge: 2015-12-31 | Disposition: A | Discharge status: Home / Self Care | Payer: Non-veteran care | Source: Ambulatory Visit | Attending: Pulmonary Disease | Admitting: Pulmonary Disease

## 2015-12-31 DIAGNOSIS — J449 Chronic obstructive pulmonary disease, unspecified: Secondary | ICD-10-CM | POA: Diagnosis not present

## 2015-12-31 NOTE — Progress Notes (Signed)
Daily Session Note  Patient Details  Name: Patricia Johnston MRN: [id number] Date of Birth: December 17, 1950 Referring Provider:   April Manson Pulmonary Rehab Walk Test from 10/01/2015 in [place]  Referring Provider  Dr. Nelda Marseille      Encounter Date: 12/31/2015  Check In:     Session Check In - 12/31/15 1330      Check-In   Location [facility]   Staff Present Rosebud Poles, RN, BSN;Molly diVincenzo, MS, ACSM RCEP, Exercise Physiologist;Lisa Ysidro Evert, RN;Burech Mcfarland Rollene Rotunda, RN, BSN   Supervising physician immediately available to respond to emergencies [facility] immediately available   Physician(s) Dr. Alfredia Ferguson   Medication changes reported     No   Fall or balance concerns reported    No   Warm-up and Cool-down Performed as group-led instruction   Resistance Training Performed Yes   VAD Patient? No     Pain Assessment   Currently in Pain? No/denies   Multiple Pain Sites No      Capillary Blood Glucose: No results found for this or any previous visit (from the past 24 hour(s)).      Exercise Prescription Changes - 12/31/15 1609      Response to Exercise   Blood Pressure (Admit) 98/56   Blood Pressure (Exercise) 124/60   Blood Pressure (Exit) 122/64   Heart Rate (Admit) 81 bpm   Heart Rate (Exercise) 108 bpm   Heart Rate (Exit) 82 bpm   Oxygen Saturation (Admit) 93 %   Oxygen Saturation (Exercise) 90 %   Oxygen Saturation (Exit) 92 %   Rating of Perceived Exertion (Exercise) 13   Perceived Dyspnea (Exercise) 3   Duration Progress to 45 minutes of aerobic exercise without signs/symptoms of physical distress   Intensity THRR unchanged     Progression   Progression Continue to progress workloads to maintain intensity without signs/symptoms of physical distress.     Resistance Training   Training Prescription Yes   Weight orange bands   Reps 10-12  10 minutes of strength training     Interval Training   Interval  Training No     Elliptical   Level 2   Speed 1   Minutes 17     Rower   Level 3   Minutes 17     Track   Laps 22   Minutes 17     Goals Met:  Independence with exercise equipment Improved SOB with ADL's Using PLB without cueing & demonstrates good technique Exercise tolerated well No report of cardiac concerns or symptoms Strength training completed today  Goals Unmet:  Not Applicable  Comments: Service time is from 1330 to 1515   Dr. Rush Farmer is Medical Director for Pulmonary Rehab at [facility].

## 2016-01-02 ENCOUNTER — Encounter (HOSPITAL_COMMUNITY)
Admission: RE | Admit: 2016-01-02 | Discharge: 2016-01-02 | Disposition: A | Discharge status: Home / Self Care | Payer: Non-veteran care | Source: Ambulatory Visit | Attending: Pulmonary Disease | Admitting: Pulmonary Disease

## 2016-01-02 VITALS — Wt 125.9 lb

## 2016-01-02 DIAGNOSIS — Z79899 Other long term (current) drug therapy: Secondary | ICD-10-CM | POA: Insufficient documentation

## 2016-01-02 DIAGNOSIS — M810 Age-related osteoporosis without current pathological fracture: Secondary | ICD-10-CM | POA: Diagnosis not present

## 2016-01-02 DIAGNOSIS — Z7951 Long term (current) use of inhaled steroids: Secondary | ICD-10-CM | POA: Diagnosis not present

## 2016-01-02 DIAGNOSIS — J449 Chronic obstructive pulmonary disease, unspecified: Secondary | ICD-10-CM | POA: Diagnosis present

## 2016-01-02 NOTE — Progress Notes (Signed)
Daily Session Note  Patient Details  Name: Patricia Johnston MRN: [id number] Date of Birth: [birth year] Referring Provider:   April Manson Pulmonary Rehab Walk Test from 10/01/2015 in [place]  Referring Provider  Dr. Nelda Marseille      Encounter Date: 01/02/2016  Check In:     Session Check In - 01/02/16 1330      Check-In   Location [facility]   Staff Present Rosebud Poles, RN, BSN;Molly diVincenzo, MS, ACSM RCEP, Exercise Physiologist;Lisa Ysidro Evert, RN;Portia Rollene Rotunda, RN, BSN   Supervising physician immediately available to respond to emergencies [facility] immediately available   Physician(s) Dr. Elder Love   Medication changes reported     No   Fall or balance concerns reported    No   Warm-up and Cool-down Performed as group-led instruction   Resistance Training Performed Yes   VAD Patient? No     Pain Assessment   Currently in Pain? No/denies   Multiple Pain Sites No      Capillary Blood Glucose: No results found for this or any previous visit (from the past 24 hour(s)).      Exercise Prescription Changes - 01/02/16 1600      Response to Exercise   Blood Pressure (Admit) 104/68   Blood Pressure (Exercise) 123/85   Blood Pressure (Exit) 127/78   Heart Rate (Admit) 76 bpm   Heart Rate (Exercise) 82 bpm   Heart Rate (Exit) 81 bpm   Oxygen Saturation (Admit) 93 %   Oxygen Saturation (Exercise) 94 %   Oxygen Saturation (Exit) 93 %   Rating of Perceived Exertion (Exercise) 13   Perceived Dyspnea (Exercise) 1   Duration Progress to 45 minutes of aerobic exercise without signs/symptoms of physical distress   Intensity THRR unchanged     Progression   Progression Continue to progress workloads to maintain intensity without signs/symptoms of physical distress.     Resistance Training   Training Prescription Yes   Weight orange bands   Reps 10-12  10 minutes of strength training     Interval Training   Interval  Training No     Track   Laps 9   Minutes 17     Goals Met:  Exercise tolerated well Strength training completed today  Goals Unmet:  Not Applicable  Comments: Service time is from 1330 to 1505    Dr. Rush Farmer is Medical Director for Pulmonary Rehab at [facility].

## 2016-01-07 ENCOUNTER — Encounter (HOSPITAL_COMMUNITY)
Admission: RE | Admit: 2016-01-07 | Discharge: 2016-01-07 | Disposition: A | Discharge status: Home / Self Care | Payer: Non-veteran care | Source: Ambulatory Visit | Attending: Pulmonary Disease | Admitting: Pulmonary Disease

## 2016-01-07 VITALS — Wt 124.8 lb

## 2016-01-07 DIAGNOSIS — J449 Chronic obstructive pulmonary disease, unspecified: Secondary | ICD-10-CM | POA: Diagnosis not present

## 2016-01-07 NOTE — Progress Notes (Signed)
Pulmonary Individual Treatment Plan  Patient Details  Name: Patricia Johnston MRN: [id number] Date of Birth: 1950/12/16 Referring Provider:   April Manson Pulmonary Rehab Walk Test from 10/01/2015 in [place]  Referring Provider  Dr. Nelda Marseille      Initial Encounter Date:  Flowsheet Row Pulmonary Rehab Walk Test from 10/01/2015 in [place]  Date  10/01/15  Referring Provider  Dr. Nelda Marseille      Visit Diagnosis: Chronic obstructive pulmonary disease, unspecified COPD type ([place])  Patient's Home Medications on Admission:   Current Outpatient Prescriptions:  .  albuterol (PROVENTIL HFA;VENTOLIN HFA) 108 (90 Base) MCG/ACT inhaler, Inhale 1-2 puffs into the lungs every 6 (six) hours as needed for wheezing or shortness of breath., Disp: , Rfl:  .  Dextromethorphan-Guaifenesin 5-100 MG/5ML LIQD, Take 5 mLs by mouth every 4 (four) hours as needed., Disp: , Rfl:  .  ipratropium-albuterol (DUONEB) 0.5-2.5 (3) MG/3ML SOLN, Take 3 mLs by nebulization every 6 (six) hours as needed., Disp: , Rfl:  .  mometasone (ASMANEX) 220 MCG/INH inhaler, Inhale 2 puffs into the lungs daily., Disp: , Rfl:  .  OXYGEN, Inhale 2 L into the lungs daily as needed (for exertion). Reported on 04/19/2015, Disp: , Rfl:  .  predniSONE (DELTASONE) 10 MG tablet, Take 5 mg by mouth 2 (two) times daily with a meal. Takes as needed per [place], Disp: , Rfl:  .  Tiotropium Bromide-Olodaterol (STIOLTO RESPIMAT) 2.5-2.5 MCG/ACT AERS, Inhale 2 puffs into the lungs daily., Disp: , Rfl:   Past Medical History: Past Medical History:  Diagnosis Date  . Asthma   . COPD (chronic obstructive pulmonary disease) ([place])    pt states due to [place] contracted COPD  . Osteoporosis     Tobacco Use: History  Smoking Status  . Never Smoker  Smokeless Tobacco  . Never Used    Labs: Recent Review Flowsheet Data    There is no flowsheet data to display.      Capillary  Blood Glucose: No results found for: GLUCAP   ADL UCSD:   Pulmonary Function Assessment:     Pulmonary Function Assessment - 09/27/15 1117      Breath   Bilateral Breath Sounds Clear   Shortness of Breath Yes;Limiting activity;Panic with Shortness of Breath      Exercise Target Goals:    Exercise Program Goal: Individual exercise prescription set with THRR, safety & activity barriers. Participant demonstrates ability to understand and report RPE using BORG scale, to self-measure pulse accurately, and to acknowledge the importance of the exercise prescription.  Exercise Prescription Goal: Starting with aerobic activity 30 plus minutes a day, 3 days per week for initial exercise prescription. Provide home exercise prescription and guidelines that participant acknowledges understanding prior to discharge.  Activity Barriers & Risk Stratification:     Activity Barriers & Cardiac Risk Stratification - 09/27/15 1116      Activity Barriers & Cardiac Risk Stratification   Activity Barriers Deconditioning;Arthritis;Shortness of Breath      6 Minute Walk:     6 Minute Walk    Row Name 10/01/15 1631         6 Minute Walk   Phase Initial     Distance 1460 feet     Walk Time 6 minutes     # of Rest Breaks 0     MPH 2.76     METS 3.07     RPE 11  Perceived Dyspnea  1     Symptoms No     Resting HR 92 bpm     Resting BP 100/62     Max Ex. HR 97 bpm     Max Ex. BP 104/60     2 Minute Post BP 104/60       Interval HR   Baseline HR 92     1 Minute HR 95     2 Minute HR 90     3 Minute HR 97     4 Minute HR 93     5 Minute HR 91     6 Minute HR 93     2 Minute Post HR 88     Interval Heart Rate? Yes       Interval Oxygen   Interval Oxygen? Yes     Baseline Oxygen Saturation % 92 %     Baseline Liters of Oxygen 0 L     1 Minute Oxygen Saturation % 88 %     1 Minute Liters of Oxygen 0 L     2 Minute Oxygen Saturation % 88 %     2 Minute Liters of Oxygen 0  L     3 Minute Oxygen Saturation % 91 %     3 Minute Liters of Oxygen 0 L     4 Minute Oxygen Saturation % 92 %     4 Minute Liters of Oxygen 0 L     5 Minute Oxygen Saturation % 91 %     5 Minute Liters of Oxygen 0 L     6 Minute Oxygen Saturation % 94 %     6 Minute Liters of Oxygen 0 L     2 Minute Post Oxygen Saturation % 92 %     2 Minute Post Liters of Oxygen 0 L        Initial Exercise Prescription:     Initial Exercise Prescription - 10/01/15 1600      Date of Initial Exercise RX and Referring Provider   Date 10/01/15   Referring Provider Dr. Nelda Marseille     NuStep   Level 2   Minutes 17   METs 1.7     Rower   Level 1   Minutes 17     Track   Laps 8   Minutes 17     Prescription Details   Frequency (times per week) 2   Duration Progress to 45 minutes of aerobic exercise without signs/symptoms of physical distress     Intensity   THRR 40-80% of Max Heartrate 62-124   Ratings of Perceived Exertion 11-13   Perceived Dyspnea 0-4     Progression   Progression Continue progressive overload as per policy without signs/symptoms or physical distress.     Resistance Training   Training Prescription Yes   Weight orange bands   Reps 10-12      Perform Capillary Blood Glucose checks as needed.  Exercise Prescription Changes:     Exercise Prescription Changes    Row Name 10/08/15 1600 10/10/15 1600 10/15/15 1529 10/17/15 1449 10/29/15 1500     Exercise Review   Progression  -  - Yes  -  -     Response to Exercise   Blood Pressure (Admit) 104/60 120/62 110/60 100/60 96/64   Blood Pressure (Exercise) 98/60 112/60 120/60 110/60 108/60   Blood Pressure (Exit) 100/60 106/60 108/64 110/66 104/60   Heart Rate (Admit) 79 bpm 77 bpm 91  bpm 91 bpm 83 bpm   Heart Rate (Exercise) 109 bpm 110 bpm 104 bpm 110 bpm 104 bpm   Heart Rate (Exit) 90 bpm 77 bpm 88 bpm 90 bpm 81 bpm   Oxygen Saturation (Admit) 91 % 93 % 91 % 93 % 93 %   Oxygen Saturation (Exercise) 89 % 92 %  94 % 90 % 92 %   Oxygen Saturation (Exit) 93 % 94 % 93 % 92 % 93 %   Rating of Perceived Exertion (Exercise) 13 13 15 12 13    Perceived Dyspnea (Exercise) 1 2 3 2 2    Duration Progress to 45 minutes of aerobic exercise without signs/symptoms of physical distress Progress to 45 minutes of aerobic exercise without signs/symptoms of physical distress Progress to 45 minutes of aerobic exercise without signs/symptoms of physical distress Progress to 45 minutes of aerobic exercise without signs/symptoms of physical distress Progress to 45 minutes of aerobic exercise without signs/symptoms of physical distress   Intensity THRR unchanged THRR unchanged THRR unchanged THRR unchanged THRR unchanged     Progression   Progression Continue to progress workloads to maintain intensity without signs/symptoms of physical distress. Continue to progress workloads to maintain intensity without signs/symptoms of physical distress. Continue to progress workloads to maintain intensity without signs/symptoms of physical distress. Continue to progress workloads to maintain intensity without signs/symptoms of physical distress. Continue to progress workloads to maintain intensity without signs/symptoms of physical distress.     Resistance Training   Training Prescription Yes Yes Yes Yes Yes   Weight orange bands orange bands orange bands orange bands orange bands   Reps 10-12  10 minutes of strength training 10-12  10 minutes of strength training 10-12  10 minutes of strength training 10-12  10 minutes of strength training 10-12  10 minutes of strength training     Interval Training   Interval Training No No No No No     NuStep   Level 2  - 3 3 3    Minutes 17  - 17 17 17    METs 2.1  - 2.3 1.9 1.8     Rower   Level 1 1 1   - 1   Minutes 17 17 17   - 17     Track   Laps 20 14 26 16 20    Minutes 17 17 17 17 17    Row Name 10/31/15 1500 11/05/15 1500 11/12/15 1400 11/14/15 1500 11/19/15 1500     Exercise Review    Progression Yes  - Yes  -  -     Response to Exercise   Blood Pressure (Admit) 104/60 126/68 100/64 104/64 104/60   Blood Pressure (Exercise) 126/70 128/70 164/80 110/60 120/76   Blood Pressure (Exit) 104/56 116/60 100/74 84/62  recheck BP 114/52 96/60  recheck BP 114/52   Heart Rate (Admit) 83 bpm 83 bpm 80 bpm 80 bpm 71 bpm   Heart Rate (Exercise) 95 bpm 97 bpm 111 bpm 103 bpm 99 bpm   Heart Rate (Exit) 78 bpm 89 bpm 87 bpm 76 bpm 88 bpm   Oxygen Saturation (Admit) 93 % 93 % 92 % 91 % 92 %   Oxygen Saturation (Exercise) 95 % 92 % 88 % 90 % 93 %   Oxygen Saturation (Exit) 92 % 93 % 97 % 90 % 93 %   Rating of Perceived Exertion (Exercise) 13 17 13 13 13    Perceived Dyspnea (Exercise) 3 3 2 3 2    Duration Progress to 45 minutes  of aerobic exercise without signs/symptoms of physical distress Progress to 45 minutes of aerobic exercise without signs/symptoms of physical distress Progress to 45 minutes of aerobic exercise without signs/symptoms of physical distress Progress to 45 minutes of aerobic exercise without signs/symptoms of physical distress Progress to 45 minutes of aerobic exercise without signs/symptoms of physical distress   Intensity THRR unchanged THRR unchanged THRR unchanged THRR unchanged THRR unchanged     Progression   Progression Continue to progress workloads to maintain intensity without signs/symptoms of physical distress. Continue to progress workloads to maintain intensity without signs/symptoms of physical distress. Continue to progress workloads to maintain intensity without signs/symptoms of physical distress. Continue to progress workloads to maintain intensity without signs/symptoms of physical distress. Continue to progress workloads to maintain intensity without signs/symptoms of physical distress.     Resistance Training   Training Prescription Yes Yes Yes Yes Yes   Weight orange bands orange bands orange bands orange bands orange bands   Reps 10-12  10  minutes of strength training 10-12  10 minutes of strength training 10-12  10 minutes of strength training 10-12  10 minutes of strength training 10-12  10 minutes of strength training     Interval Training   Interval Training No No No No No     NuStep   Level 5 5 6 6 6    Minutes 17 17 17 17 17    METs 1.7 2.5 4 3.6 4.4     Rower   Level 1 2 3   - 3   Minutes 17 17 17   - 17     Track   Laps  - 15 26 11 23    Minutes  - 17 17 17 17      Home Exercise Plan   Plans to continue exercise at  -  -  - Home  -   Frequency  -  -  - Add 3 additional days to program exercise sessions.  -   Row Name 11/21/15 1500 11/28/15 1600 12/03/15 1600 12/05/15 1600 12/10/15 1500     Response to Exercise   Blood Pressure (Admit) 104/56 110/60 96/50 100/58 100/50   Blood Pressure (Exercise) 130/78 140/80 112/60 126/80 120/70   Blood Pressure (Exit) 110/60 100/60 100/58 92/60 108/56   Heart Rate (Admit) 74 bpm 74 bpm 78 bpm 82 bpm 84 bpm   Heart Rate (Exercise) 108 bpm 99 bpm 102 bpm 104 bpm 107 bpm   Heart Rate (Exit) 99 bpm 79 bpm 79 bpm 80 bpm 78 bpm   Oxygen Saturation (Admit) 91 % 93 % 93 % 93 % 93 %   Oxygen Saturation (Exercise) 92 % 91 % 92 % 92 % 90 %   Oxygen Saturation (Exit) 94 % 92 % 93 % 93 % 93 %   Rating of Perceived Exertion (Exercise) 13 14 14 14 17    Perceived Dyspnea (Exercise) 3 2 2 1  3.5   Duration Progress to 45 minutes of aerobic exercise without signs/symptoms of physical distress Progress to 45 minutes of aerobic exercise without signs/symptoms of physical distress Progress to 45 minutes of aerobic exercise without signs/symptoms of physical distress Progress to 45 minutes of aerobic exercise without signs/symptoms of physical distress Progress to 45 minutes of aerobic exercise without signs/symptoms of physical distress   Intensity THRR unchanged THRR unchanged THRR unchanged THRR unchanged THRR unchanged     Progression   Progression Continue to progress workloads to maintain  intensity without signs/symptoms of physical distress. Continue to progress workloads  to maintain intensity without signs/symptoms of physical distress. Continue to progress workloads to maintain intensity without signs/symptoms of physical distress. Continue to progress workloads to maintain intensity without signs/symptoms of physical distress. Continue to progress workloads to maintain intensity without signs/symptoms of physical distress.     Resistance Training   Training Prescription Yes Yes Yes Yes Yes   Weight orange bands orange bands orange bands orange bands orange bands   Reps 10-12  10 minutes of strength training 10-12  10 minutes of strength training 10-12  10 minutes of strength training 10-12  10 minutes of strength training 10-12  10 minutes of strength training     Interval Training   Interval Training No No No No No     NuStep   Level 6  -  -  -  -   Minutes 17  -  -  -  -   METs 4.3  -  -  -  -     Elliptical   Level  - 2 2  - 2   Minutes  - 17 17  - 17     Rower   Level 3  - 3 3 3    Minutes 17  - 17 17 17      Track   Laps  - 15 24 19 20    Minutes  - 17 17 17 17    Row Name 12/12/15 1534 12/17/15 1549 12/19/15 1539 12/24/15 1606 12/26/15 1600     Response to Exercise   Blood Pressure (Admit) 110/54 98/50 104/56 106/70 106/54   Blood Pressure (Exercise) 140/80 110/66 106/62 110/60 120/86   Blood Pressure (Exit) 110/60 102/60 104/58 100/54 98/56   Heart Rate (Admit) 79 bpm 82 bpm 81 bpm 77 bpm 82 bpm   Heart Rate (Exercise) 108 bpm 111 bpm 112 bpm 104 bpm 117 bpm   Heart Rate (Exit) 82 bpm 76 bpm 96 bpm 78 bpm 93 bpm   Oxygen Saturation (Admit) 93 % 94 % 91 % 93 % 94 %   Oxygen Saturation (Exercise) 88 % 90 % 92 % 91 % 91 %   Oxygen Saturation (Exit) 91 % 90 % 91 % 93 % 93 %   Rating of Perceived Exertion (Exercise) 13 13 13 13 13    Perceived Dyspnea (Exercise) 3 2 1 3 3    Duration Progress to 45 minutes of aerobic exercise without signs/symptoms of  physical distress Progress to 45 minutes of aerobic exercise without signs/symptoms of physical distress Progress to 45 minutes of aerobic exercise without signs/symptoms of physical distress Progress to 45 minutes of aerobic exercise without signs/symptoms of physical distress Progress to 45 minutes of aerobic exercise without signs/symptoms of physical distress   Intensity THRR unchanged THRR unchanged THRR unchanged THRR unchanged THRR unchanged     Progression   Progression Continue to progress workloads to maintain intensity without signs/symptoms of physical distress. Continue to progress workloads to maintain intensity without signs/symptoms of physical distress. Continue to progress workloads to maintain intensity without signs/symptoms of physical distress. Continue to progress workloads to maintain intensity without signs/symptoms of physical distress. Continue to progress workloads to maintain intensity without signs/symptoms of physical distress.     Resistance Training   Training Prescription Yes Yes Yes Yes Yes   Weight orange bands orange bands orange bands orange bands orange bands   Reps 10-12  10 minutes of strength training 10-12  10 minutes of strength training 10-12  10 minutes of strength training 10-12  10  minutes of strength training 10-12  10 minutes of strength training     Interval Training   Interval Training No No No No No     Elliptical   Level 2 2 2 2 2    Speed 1 1 1 1 1    Minutes 17 17 17 17 17      Rower   Level  - 3 3 3   -   Minutes  - 17 17 17   -     Track   Laps 16 21  - 20 20   Minutes [place] Name 12/31/15 1609 01/02/16 1600           Response to Exercise   Blood Pressure (Admit) 98/56 104/68      Blood Pressure (Exercise) 124/60 123/85      Blood Pressure (Exit) 122/64 127/78      Heart Rate (Admit) 81 bpm 76 bpm      Heart Rate (Exercise) 108 bpm 82 bpm      Heart Rate (Exit) 82 bpm 81 bpm      Oxygen Saturation (Admit) 93  % 93 %      Oxygen Saturation (Exercise) 90 % 94 %      Oxygen Saturation (Exit) 92 % 93 %      Rating of Perceived Exertion (Exercise) 13 13      Perceived Dyspnea (Exercise) 3 1      Duration Progress to 45 minutes of aerobic exercise without signs/symptoms of physical distress Progress to 45 minutes of aerobic exercise without signs/symptoms of physical distress      Intensity THRR unchanged THRR unchanged        Progression   Progression Continue to progress workloads to maintain intensity without signs/symptoms of physical distress. Continue to progress workloads to maintain intensity without signs/symptoms of physical distress.        Resistance Training   Training Prescription Yes Yes      Weight orange bands orange bands      Reps 10-12  10 minutes of strength training 10-12  10 minutes of strength training        Interval Training   Interval Training No No        Elliptical   Level 2  -      Speed 1  -      Minutes 17  -        Rower   Level 3  -      Minutes 17  -        Track   Laps 22 9      Minutes 17 17         Exercise Comments:     Exercise Comments    Row Name 10/10/15 0830 11/11/15 1402 11/14/15 1527 12/09/15 1145 01/06/16 1013   Exercise Comments Patient has only attended one exercise session. First session went very well. Will cont. to monitor and progress.  Patient is progressing well in workloads. Will cont. to monitor. Home exercise complete Patient is progressing well. Is now utilizing the elliptical which she finds challenging but rewarding. Will cont. to monitor.  Patient is progressing well in program. Able to work at high intensities. She gets very concerned when her oxygen saturation drops below 94% while exercising. Educated her that this is not a problem. Will cont. to monitor and progress.      Discharge Exercise Prescription (Final Exercise Prescription Changes):     Exercise  Prescription Changes - 01/02/16 1600      Response to  Exercise   Blood Pressure (Admit) 104/68   Blood Pressure (Exercise) 123/85   Blood Pressure (Exit) 127/78   Heart Rate (Admit) 76 bpm   Heart Rate (Exercise) 82 bpm   Heart Rate (Exit) 81 bpm   Oxygen Saturation (Admit) 93 %   Oxygen Saturation (Exercise) 94 %   Oxygen Saturation (Exit) 93 %   Rating of Perceived Exertion (Exercise) 13   Perceived Dyspnea (Exercise) 1   Duration Progress to 45 minutes of aerobic exercise without signs/symptoms of physical distress   Intensity THRR unchanged     Progression   Progression Continue to progress workloads to maintain intensity without signs/symptoms of physical distress.     Resistance Training   Training Prescription Yes   Weight orange bands   Reps 10-12  10 minutes of strength training     Interval Training   Interval Training No     Track   Laps 9   Minutes 17       Nutrition:  Target Goals: Understanding of nutrition guidelines, daily intake of sodium 1500mg , cholesterol 200mg , calories 30% from fat and 7% or less from saturated fats, daily to have 5 or more servings of fruits and vegetables.  Biometrics:     Pre Biometrics - 09/27/15 1118      Pre Biometrics   Grip Strength 39 kg       Nutrition Therapy Plan and Nutrition Goals:     Nutrition Therapy & Goals - 10/29/15 1504      Nutrition Therapy   Diet High Calorie, High Protein diet     Personal Nutrition Goals   Personal Goal #1 1-2 lb wt gain/week to a goal wt gain goal of 6-12 lb or a weight of 140-143 lb at graduation from [place], educate and counsel regarding individualized specific dietary modifications aiming towards targeted core components such as weight, hypertension, lipid management, diabetes, heart failure and other comorbidities.;Nutrition handout(s) given to patient.  High Calorie, High Protein diet; Suggestions for increasing calories and protein; High Calorie, High Protein  recipes   Expected Outcomes Short Term Goal: A plan has been developed with personal nutrition goals set during dietitian appointment.;Long Term Goal: Adherence to prescribed nutrition plan.      Nutrition Discharge: Rate Your Plate Scores:     Nutrition Assessments - 10/29/15 1504      Rate Your Plate Scores   Pre Score --  returned incomplete. Goal is for pt to gain wt.       Psychosocial: Target Goals: Acknowledge presence or absence of depression, maximize coping skills, provide positive support system. Participant is able to verbalize types and ability to use techniques and skills needed for reducing stress and depression.  Initial Review & Psychosocial Screening:     Initial Psych Review & Screening - 09/27/15 [place]? Yes     Barriers   Psychosocial barriers to participate in program There are no identifiable barriers or psychosocial needs.     Screening Interventions   Interventions Encouraged to exercise      Quality of Life Scores:   PHQ-9: Recent Review Flowsheet Data    Depression screen [facility] 2/9 09/27/2015   Decreased Interest 0   Down, Depressed, Hopeless 0   PHQ - 2 Score 0      Psychosocial  Evaluation and Intervention:     Psychosocial Evaluation - 09/27/15 1122      Psychosocial Evaluation & Interventions   Interventions Encouraged to exercise with the program and follow exercise prescription      Psychosocial Re-Evaluation:     Psychosocial Re-Evaluation    [place] Name 10/10/15 0850 11/07/15 [id number] 12/09/15 0842 12/31/15 1127       Psychosocial Re-Evaluation   Interventions Encouraged to attend Pulmonary Rehabilitation for the exercise Encouraged to attend Pulmonary Rehabilitation for the exercise Encouraged to attend Pulmonary Rehabilitation for the exercise Encouraged to attend Pulmonary Rehabilitation for the exercise    Comments no psychosocial issues identified at this time None identified at  this time Continues to have no psychosocial issues at this time. no psychosocial issues identified.    Continued Psychosocial Services Needed No No No No      Education: Education Goals: Education classes will be provided on a weekly basis, covering required topics. Participant will state understanding/return demonstration of topics presented.  Learning Barriers/Preferences:     Learning Barriers/Preferences - 09/27/15 1116      Learning Barriers/Preferences   Learning Barriers None   Learning Preferences Written Material;Computer/Internet;Verbal Instruction;Audio      Education Topics: Risk Factor Reduction:  -Group instruction that is supported by a PowerPoint presentation. Instructor discusses the definition of a risk factor, different risk factors for pulmonary disease, and how the heart and lungs work together.     Nutrition for Pulmonary Patient:  -Group instruction provided by PowerPoint slides, verbal discussion, and written materials to support subject matter. The instructor gives an explanation and review of healthy diet recommendations, which includes a discussion on weight management, recommendations for fruit and vegetable consumption, as well as protein, fluid, caffeine, fiber, sodium, sugar, and alcohol. Tips for eating when patients are short of breath are discussed. Flowsheet Row PULMONARY REHAB CHRONIC OBSTRUCTIVE PULMONARY DISEASE from 01/02/2016 in [place]  Date  10/10/15  Educator  RD  Instruction Review Code  2- meets goals/outcomes      Pursed Lip Breathing:  -Group instruction that is supported by demonstration and informational handouts. Instructor discusses the benefits of pursed lip and diaphragmatic breathing and detailed demonstration on how to preform both.   Flowsheet Row PULMONARY REHAB CHRONIC OBSTRUCTIVE PULMONARY DISEASE from 01/02/2016 in [place]  Date  12/12/15  Educator  EP   Instruction Review Code  2- meets goals/outcomes      Oxygen Safety:  -Group instruction provided by PowerPoint, verbal discussion, and written material to support subject matter. There is an overview of "What is Oxygen" and "Why do we need it".  Instructor also reviews how to create a safe environment for oxygen use, the importance of using oxygen as prescribed, and the risks of noncompliance. There is a brief discussion on traveling with oxygen and resources the patient may utilize. Flowsheet Row PULMONARY REHAB CHRONIC OBSTRUCTIVE PULMONARY DISEASE from 01/02/2016 in [place]  Date  01/02/16  Educator  rn  Instruction Review Code  2- meets goals/outcomes      Oxygen Equipment:  -Group instruction provided by [employer] Staff utilizing handouts, written materials, and equipment demonstrations.   Signs and Symptoms:  -Group instruction provided by written material and verbal discussion to support subject matter. Warning signs and symptoms of infection, stroke, and heart attack are reviewed and when to call the physician/911 reinforced. Tips for preventing the spread of infection discussed. Flowsheet Row  PULMONARY REHAB CHRONIC OBSTRUCTIVE PULMONARY DISEASE from 01/02/2016 in [place]  Date  11/28/15  Educator  rn  Instruction Review Code  2- meets goals/outcomes      Advanced Directives:  -Group instruction provided by verbal instruction and written material to support subject matter. Instructor reviews Advanced Directive laws and proper instruction for filling out document.   Pulmonary Video:  -Group video education that reviews the importance of medication and oxygen compliance, exercise, good nutrition, pulmonary hygiene, and pursed lip and diaphragmatic breathing for the pulmonary patient. Flowsheet Row PULMONARY REHAB CHRONIC OBSTRUCTIVE PULMONARY DISEASE from 01/02/2016 in [place]  Date  12/19/15  Educator  video  Instruction Review Code  2- meets goals/outcomes      Exercise for the Pulmonary Patient:  -Group instruction that is supported by a PowerPoint presentation. Instructor discusses benefits of exercise, core components of exercise, frequency, duration, and intensity of an exercise routine, importance of utilizing pulse oximetry during exercise, safety while exercising, and options of places to exercise outside of rehab.   Flowsheet Row PULMONARY REHAB CHRONIC OBSTRUCTIVE PULMONARY DISEASE from 01/02/2016 in [place]  Date  11/21/15  Educator  EP  Instruction Review Code  2- meets goals/outcomes      Pulmonary Medications:  -Verbally interactive group education provided by instructor with focus on inhaled medications and proper administration.   Anatomy and Physiology of the Respiratory System and Intimacy:  -Group instruction provided by PowerPoint, verbal discussion, and written material to support subject matter. Instructor reviews respiratory cycle and anatomical components of the respiratory system and their functions. Instructor also reviews differences in obstructive and restrictive respiratory diseases with examples of each. Intimacy, Sex, and Sexuality differences are reviewed with a discussion on how relationships can change when diagnosed with pulmonary disease. Common sexual concerns are reviewed.   Knowledge Questionnaire Score:   Core Components/Risk Factors/Patient Goals at Admission:     Personal Goals and Risk Factors at Admission - 09/27/15 1120      Core Components/Risk Factors/Patient Goals on Admission   Increase Strength and Stamina Yes   Intervention Provide advice, education, support and counseling about physical activity/exercise needs.;Develop an individualized exercise prescription for aerobic and resistive training based on initial evaluation findings, risk stratification, comorbidities  and participant's personal goals.   Expected Outcomes Achievement of increased cardiorespiratory fitness and enhanced flexibility, muscular endurance and strength shown through measurements of functional capacity and personal statement of participant.   Improve shortness of breath with ADL's Yes   Intervention Provide education, individualized exercise plan and daily activity instruction to help decrease symptoms of SOB with activities of daily living.   Expected Outcomes Short Term: Achieves a reduction of symptoms when performing activities of daily living.   Develop more efficient breathing techniques such as purse lipped breathing and diaphragmatic breathing; and practicing self-pacing with activity Yes   Intervention Provide education, demonstration and support about specific breathing techniuqes utilized for more efficient breathing. Include techniques such as pursed lipped breathing, diaphragmatic breathing and self-pacing activity.   Expected Outcomes Short Term: Participant will be able to demonstrate and use breathing techniques as needed throughout daily activities.   Increase knowledge of respiratory medications and ability to use respiratory devices properly  Yes   Intervention Provide education and demonstration as needed of appropriate use of medications, inhalers, and oxygen therapy.   Expected Outcomes Short Term: Achieves understanding of medications use. Understands that oxygen is a  medication prescribed by physician. Demonstrates appropriate use of inhaler and oxygen therapy.      Core Components/Risk Factors/Patient Goals Review:      Goals and Risk Factor Review    Row Name 10/10/15 0848 11/07/15 0810 12/09/15 0840 12/31/15 1123       Core Components/Risk Factors/Patient Goals Review   Personal Goals Review  - Develop more efficient breathing techniques such as purse lipped breathing and diaphragmatic breathing and practicing self-pacing with activity.;Increase Strength  and Stamina;Improve shortness of breath with ADL's;Increase knowledge of respiratory medications and ability to use respiratory devices properly. Improve shortness of breath with ADL's;Increase Strength and Stamina Increase Strength and Stamina;Improve shortness of breath with ADL's    Review Has only exercised in 1 session, too early to meet any goals Progressing well despite COPD exacerbation, on second round of antibiotics, nustep level 5, track 15 laps, rower level 2. Continues to progress, up to 19 laps on track, rower level 3, nustep level 6.  Attendance regular. Workloads have increased to 20-21 laps on track, is exercising on the ellipitical , and level 3 on the rower, making good progress    Expected Outcomes Should see improvement by the next review Progressing well Continue to increase workloads as tolerated. to be much stronger at graduation than at beginning of program       Core Components/Risk Factors/Patient Goals at Discharge (Final Review):      Goals and Risk Factor Review - 12/31/15 1123      Core Components/Risk Factors/Patient Goals Review   Personal Goals Review Increase Strength and Stamina;Improve shortness of breath with ADL's   Review Workloads have increased to 20-21 laps on track, is exercising on the ellipitical , and level 3 on the rower, making good progress   Expected Outcomes to be much stronger at graduation than at beginning of program      ITP Comments:   Comments:ITP REVIEW Pt is making expected progress toward pulmonary rehab goals after completing 23 sessions. Recommend continued exercise, life style modification, education, and utilization of breathing techniques to increase stamina and strength and decrease shortness of breath with exertion.

## 2016-01-07 NOTE — Progress Notes (Signed)
Daily Session Note  Patient Details  Name: Patricia Johnston MRN: [id number] Date of Birth: Nov 11, 1950 Referring Provider:   April Manson Pulmonary Rehab Walk Test from 10/01/2015 in [place]  Referring Provider  Dr. Nelda Marseille      Encounter Date: 01/07/2016  Check In:     Session Check In - 01/07/16 1538      Check-In   Location [facility]   Staff Present Su Hilt, MS, ACSM RCEP, Exercise Physiologist;Joan [place], RN, BSN;Lisa Ysidro Evert, Felipe Drone, RN, MHA;Akita Maxim Rollene Rotunda, RN, BSN   Supervising physician immediately available to respond to emergencies [facility] immediately available   Physician(s) Dr. Lonny Prude   Medication changes reported     No   Fall or balance concerns reported    No   Warm-up and Cool-down Performed as group-led instruction   Resistance Training Performed Yes   VAD Patient? No     Pain Assessment   Currently in Pain? No/denies   Multiple Pain Sites No      Capillary Blood Glucose: No results found for this or any previous visit (from the past 24 hour(s)).      Exercise Prescription Changes - 01/07/16 1534      Response to Exercise   Blood Pressure (Admit) 124/50   Blood Pressure (Exercise) 100/66   Blood Pressure (Exit) 114/60   Heart Rate (Admit) 88 bpm   Heart Rate (Exercise) 102 bpm   Heart Rate (Exit) 84 bpm   Oxygen Saturation (Admit) 88 %   Oxygen Saturation (Exercise) 89 %   Oxygen Saturation (Exit) 93 %   Rating of Perceived Exertion (Exercise) 13   Perceived Dyspnea (Exercise) 3   Duration Progress to 45 minutes of aerobic exercise without signs/symptoms of physical distress   Intensity THRR unchanged     Progression   Progression Continue to progress workloads to maintain intensity without signs/symptoms of physical distress.     Resistance Training   Training Prescription Yes   Weight orange bands   Reps 10-12  10 minutes of strength training     Interval Training   Interval Training No     Elliptical   Level 2   Speed 1   Minutes 17     Rower   Level 3   Minutes 17     Track   Laps 9   Minutes 17     Goals Met:  Independence with exercise equipment Improved SOB with ADL's Using PLB without cueing & demonstrates good technique Exercise tolerated well No report of cardiac concerns or symptoms Strength training completed today  Goals Unmet:  O2 Sat, Dyspnea. Patient c/o wheezing and more SOB than usual. Upon ascultation it was noted that patient has expiratory wheezes throughout. She was encouraged to use her rescue inhaler. She attempted to use her long acting B2 antagonist. Upon further questioning she verbalized she did not use her maintenance inhalers as prescribed. She used them PRN as well. Medications, indications, and dosages clarified with patient. Teach back noted. Patient returned demonstration for proper rescue inhaler use. Wheezing diminished and patient able to return to exercise.  Comments: Service time is from 1330 to 1510   Dr. Rush Farmer is Medical Director for Pulmonary Rehab at [facility]

## 2016-01-09 ENCOUNTER — Encounter (HOSPITAL_COMMUNITY)
Admission: RE | Admit: 2016-01-09 | Discharge: 2016-01-09 | Disposition: A | Discharge status: Home / Self Care | Payer: Non-veteran care | Source: Ambulatory Visit | Attending: Pulmonary Disease | Admitting: Pulmonary Disease

## 2016-01-09 VITALS — Wt 126.5 lb

## 2016-01-09 DIAGNOSIS — J449 Chronic obstructive pulmonary disease, unspecified: Secondary | ICD-10-CM

## 2016-01-09 NOTE — Progress Notes (Signed)
Daily Session Note  Patient Details  Name: Patricia Johnston MRN: [id number] Date of Birth: 1950-07-08 Referring Provider:   April Manson Pulmonary Rehab Walk Test from 10/01/2015 in [place]  Referring Provider  Dr. Nelda Marseille      Encounter Date: 01/09/2016  Check In:     Session Check In - 01/09/16 1330      Check-In   Location [facility]   Staff Present Rosebud Poles, RN, BSN;Amanda Pote Ysidro Evert, Felipe Drone, RN, MHA;Portia Rollene Rotunda, RN, BSN   Supervising physician immediately available to respond to emergencies [facility] immediately available   Physician(s) Dr. Tana Coast   Medication changes reported     No   Fall or balance concerns reported    No   Warm-up and Cool-down Performed as group-led instruction   Resistance Training Performed Yes   VAD Patient? No     Pain Assessment   Currently in Pain? No/denies   Multiple Pain Sites No      Capillary Blood Glucose: No results found for this or any previous visit (from the past 24 hour(s)).      Exercise Prescription Changes - 01/09/16 1600      Response to Exercise   Blood Pressure (Admit) 124/66   Blood Pressure (Exercise) 110/68   Blood Pressure (Exit) 118/70   Heart Rate (Admit) 75 bpm   Heart Rate (Exercise) 101 bpm   Heart Rate (Exit) 73 bpm   Oxygen Saturation (Admit) 94 %   Oxygen Saturation (Exercise) 90 %   Oxygen Saturation (Exit) 93 %   Rating of Perceived Exertion (Exercise) 13   Perceived Dyspnea (Exercise) 3   Duration Progress to 45 minutes of aerobic exercise without signs/symptoms of physical distress   Intensity THRR unchanged     Progression   Progression Continue to progress workloads to maintain intensity without signs/symptoms of physical distress.     Resistance Training   Training Prescription Yes   Weight orange bands   Reps 10-12  10 minutes of strength training     Interval Training   Interval Training No     Elliptical   Level 2   Speed 1   Minutes 17     Track   Laps 20   Minutes 17     Goals Met:  Exercise tolerated well No report of cardiac concerns or symptoms Strength training completed today  Goals Unmet:  Not Applicable  Comments: Service time is from 1330 to 1545    Dr. Rush Farmer is Medical Director for Pulmonary Rehab at [facility].

## 2016-01-14 ENCOUNTER — Encounter (HOSPITAL_COMMUNITY)
Admission: RE | Admit: 2016-01-14 | Discharge: 2016-01-14 | Disposition: A | Discharge status: Home / Self Care | Payer: Non-veteran care | Source: Ambulatory Visit | Attending: Pulmonary Disease | Admitting: Pulmonary Disease

## 2016-01-14 VITALS — Wt 126.3 lb

## 2016-01-14 DIAGNOSIS — J449 Chronic obstructive pulmonary disease, unspecified: Secondary | ICD-10-CM | POA: Diagnosis not present

## 2016-01-14 NOTE — Progress Notes (Signed)
Daily Session Note  Patient Details  Name: Patricia Johnston MRN: [id number] Date of Birth: 04-Mar-1950 Referring Provider:   April Manson Pulmonary Rehab Walk Test from 10/01/2015 in [place]  Referring Provider  Dr. Nelda Marseille      Encounter Date: 01/14/2016  Check In:     Session Check In - 01/14/16 1337      Check-In   [facility]   Staff Present Rosebud Poles, RN, BSN;Kenry Daubert Ysidro Evert, RN;Molly diVincenzo, MS, ACSM RCEP, Exercise Physiologist;Olinty Celesta Aver, MS, ACSM CEP, Exercise Physiologist   Supervising physician immediately available to respond to emergencies [facility] immediately available   Physician(s) Dr. Tana Coast   Medication changes reported     No   Fall or balance concerns reported    No   Warm-up and Cool-down Performed as group-led instruction   Resistance Training Performed Yes   VAD Patient? No     Pain Assessment   Currently in Pain? No/denies   Multiple Pain Sites No      Capillary Blood Glucose: No results found for this or any previous visit (from the past 24 hour(s)).      Exercise Prescription Changes - 01/14/16 1500      Exercise Review   Progression Yes     Response to Exercise   Blood Pressure (Admit) 108/60   Blood Pressure (Exercise) 114/70   Blood Pressure (Exit) 104/60   Heart Rate (Admit) 76 bpm   Heart Rate (Exercise) 107 bpm   Heart Rate (Exit) 79 bpm   Oxygen Saturation (Admit) 94 %   Oxygen Saturation (Exercise) 94 %   Oxygen Saturation (Exit) 92 %   Rating of Perceived Exertion (Exercise) 13   Perceived Dyspnea (Exercise) 2   Duration Progress to 45 minutes of aerobic exercise without signs/symptoms of physical distress   Intensity THRR unchanged     Progression   Progression Continue to progress workloads to maintain intensity without signs/symptoms of physical distress.     Resistance Training   Training Prescription Yes   Weight orange bands   Reps 10-12  10  minutes of strength training     Interval Training   Interval Training No     Elliptical   Level 2   Speed 1   Minutes 17     Rower   Level 4   Minutes 17     Track   Laps 23   Minutes 17     Goals Met:  Exercise tolerated well No report of cardiac concerns or symptoms Strength training completed today  Goals Unmet:  Not Applicable  Comments: Service time is from 1330 to 1500    Dr. Rush Farmer is Medical Director for Pulmonary Rehab at [facility].

## 2016-01-16 ENCOUNTER — Encounter (HOSPITAL_COMMUNITY)
Admission: RE | Admit: 2016-01-16 | Discharge: 2016-01-16 | Disposition: A | Discharge status: Home / Self Care | Payer: Non-veteran care | Source: Ambulatory Visit | Attending: Pulmonary Disease | Admitting: Pulmonary Disease

## 2016-01-16 DIAGNOSIS — J449 Chronic obstructive pulmonary disease, unspecified: Secondary | ICD-10-CM

## 2016-01-16 NOTE — Progress Notes (Signed)
Daily Session Note  Patient Details  Name: Patricia Johnston MRN: [id number] Date of Birth: Oct 23, 1950 Referring Provider:   April Manson Pulmonary Rehab Walk Test from 10/01/2015 in [place]  Referring Provider  Dr. Nelda Marseille      Encounter Date: 01/16/2016  Check In:     Session Check In - 01/16/16 1330      Check-In   Location [facility]   Staff Present Rosebud Poles, RN, BSN;Molly diVincenzo, MS, ACSM RCEP, Exercise Physiologist;Lisa Ysidro Evert, RN;Portia Rollene Rotunda, RN, BSN   Supervising physician immediately available to respond to emergencies [facility] immediately available   Physician(s) Dr. Grandville Silos   Medication changes reported     No   Fall or balance concerns reported    No   Warm-up and Cool-down Performed as group-led instruction   Resistance Training Performed Yes   VAD Patient? No     Pain Assessment   Currently in Pain? No/denies   Multiple Pain Sites No      Capillary Blood Glucose: No results found for this or any previous visit (from the past 24 hour(s)).      Exercise Prescription Changes - 01/16/16 1500      Response to Exercise   Blood Pressure (Admit) 114/50   Blood Pressure (Exercise) 134/62   Blood Pressure (Exit) 100/60   Heart Rate (Admit) 81 bpm   Heart Rate (Exercise) 113 bpm   Heart Rate (Exit) 95 bpm   Oxygen Saturation (Admit) 93 %   Oxygen Saturation (Exercise) 91 %   Oxygen Saturation (Exit) 92 %   Rating of Perceived Exertion (Exercise) 11   Perceived Dyspnea (Exercise) 2   Duration Progress to 45 minutes of aerobic exercise without signs/symptoms of physical distress   Intensity THRR unchanged     Progression   Progression Continue to progress workloads to maintain intensity without signs/symptoms of physical distress.     Resistance Training   Training Prescription Yes   Weight orange bands   Reps 10-12  10 minutes of strength training     Interval Training   Interval  Training No     Elliptical   Level 2   Speed 1   Minutes 17     Rower   Level 4   Minutes 17     Goals Met:  Exercise tolerated well Strength training completed today  Goals Unmet:  Not Applicable  Comments: Service time is from1330  to 1530    Dr. Rush Farmer is Medical Director for Pulmonary Rehab at [facility].

## 2016-01-21 ENCOUNTER — Encounter (HOSPITAL_COMMUNITY): Payer: Self-pay | Admitting: *Deleted

## 2016-01-21 ENCOUNTER — Encounter (HOSPITAL_COMMUNITY): Payer: Self-pay | Admitting: Emergency Medicine

## 2016-01-21 ENCOUNTER — Encounter (HOSPITAL_COMMUNITY): Payer: Non-veteran care

## 2016-01-21 ENCOUNTER — Ambulatory Visit (HOSPITAL_COMMUNITY)
Admission: EM | Admit: 2016-01-21 | Discharge: 2016-01-21 | Disposition: A | Discharge status: Home / Self Care | Payer: Non-veteran care | Attending: Family Medicine | Admitting: Family Medicine

## 2016-01-21 DIAGNOSIS — S39012A Strain of muscle, fascia and tendon of lower back, initial encounter: Secondary | ICD-10-CM

## 2016-01-21 MED ORDER — NAPROXEN 500 MG PO TABS: 500.0000 mg | ORAL_TABLET | Freq: Two times a day (BID) | ORAL | 0 refills | Status: DC

## 2016-01-21 MED ORDER — CYCLOBENZAPRINE HCL 5 MG PO TABS: ORAL_TABLET | ORAL | 0 refills | Status: DC

## 2016-01-21 MED ORDER — KETOROLAC TROMETHAMINE 60 MG/2ML IM SOLN
60.0000 mg | Freq: Once | INTRAMUSCULAR | Status: AC
  Administered 2016-01-21: 60 mg via INTRAMUSCULAR

## 2016-01-21 MED ORDER — KETOROLAC TROMETHAMINE 60 MG/2ML IM SOLN
INTRAMUSCULAR | Status: AC
  Filled 2016-01-21: qty 2

## 2016-01-21 NOTE — Progress Notes (Signed)
Kaylaa arrived at pulmonary rehab today with c/o having difficulty walking due to straining a muscle after bending over to pick something up.  Encouraged to seek medical attention at an urgent care.

## 2016-01-21 NOTE — ED Provider Notes (Signed)
CSN: [id number]     Arrival date & time 01/21/16  1350 History   First MD Initiated Contact with Patient 01/21/16 1424     Chief Complaint  Patient presents with  . Back Pain   (Consider location/radiation/quality/duration/timing/severity/associated sxs/prior Treatment) Patient was bending over yesterday and pulled a muscle in her right back.     The history is provided by the patient.  Back Pain  Location:  Generalized Quality:  Aching Pain severity:  Severe Pain is:  Same all the time Onset quality:  Sudden Duration:  1 day Timing:  Constant Progression:  Worsening Chronicity:  New Context: recent injury   Relieved by:  Nothing Worsened by:  Ambulation and bending Ineffective treatments:  None tried   Past Medical History:  Diagnosis Date  . Asthma   . COPD (chronic obstructive pulmonary disease) ([place])    pt states due to [place] contracted COPD  . Osteoporosis    Past Surgical History:  Procedure Laterality Date  . ABDOMINAL HYSTERECTOMY    . bunionectomy     History reviewed. No pertinent family history. Social History  Substance Use Topics  . Smoking status: Never Smoker  . Smokeless tobacco: Never Used  . Alcohol use No   OB History    No data available     Review of Systems  Constitutional: Negative.   HENT: Negative.   Eyes: Negative.   Respiratory: Negative.   Cardiovascular: Negative.   Gastrointestinal: Negative.   Endocrine: Negative.   Genitourinary: Negative.   Musculoskeletal: Positive for back pain.  Allergic/Immunologic: Negative.   Neurological: Negative.   Hematological: Negative.   Psychiatric/Behavioral: Negative.     Allergies  Flonase [fluticasone propionate] and Nasacort [triamcinolone]  Home Medications   Prior to Admission medications   Medication Sig Start Date End Date Taking? Authorizing Provider  albuterol (PROVENTIL HFA;VENTOLIN HFA) 108 (90 Base) MCG/ACT inhaler Inhale 1-2 puffs into the lungs every 6  (six) hours as needed for wheezing or shortness of breath.   Yes Historical Provider, MD  ipratropium-albuterol (DUONEB) 0.5-2.5 (3) MG/3ML SOLN Take 3 mLs by nebulization every 6 (six) hours as needed.   Yes Historical Provider, MD  mometasone [facility]) 220 MCG/INH inhaler Inhale 2 puffs into the lungs daily.   Yes Historical Provider, MD  OXYGEN Inhale 2 L into the lungs daily as needed (for exertion). Reported on 04/19/2015   Yes Historical Provider, MD  Tiotropium Bromide-Olodaterol (STIOLTO RESPIMAT) 2.5-2.5 MCG/ACT AERS Inhale 2 puffs into the lungs daily.   Yes Historical Provider, MD  cyclobenzaprine (FLEXERIL) 5 MG tablet Take 1-2 po tid prn pain / muscle spasm 01/21/16   Lysbeth Penner, FNP  Dextromethorphan-Guaifenesin 5-100 MG/5ML LIQD Take 5 mLs by mouth every 4 (four) hours as needed.    Historical Provider, MD  naproxen (NAPROSYN) 500 MG tablet Take 1 tablet (500 mg total) by mouth 2 (two) times daily with a meal. 01/21/16   Lysbeth Penner, FNP  predniSONE (DELTASONE) 10 MG tablet Take 5 mg by mouth 2 (two) times daily with a meal. Takes as needed per [place] Provider, MD   Meds Ordered and Administered this Visit   Medications  ketorolac (TORADOL) injection 60 mg (60 mg Intramuscular Given 01/21/16 1450)    BP 115/72 (BP Location: Left Arm)   Pulse 70   Temp 97.8 F (36.6 C) (Oral)   Resp 16   SpO2 95%  No data found.   Physical Exam  Constitutional: She appears  well-developed and well-nourished.  HENT:  Head: Normocephalic and atraumatic.  Eyes: EOM are normal. Pupils are equal, round, and reactive to light.  Neck: Normal range of motion. Neck supple.  Cardiovascular: Normal rate, regular rhythm and normal heart sounds.   Pulmonary/Chest: Effort normal and breath sounds normal.  Abdominal: Soft.  Musculoskeletal: She exhibits tenderness.  TTP right lumbar paraspinous muscle.  Nursing note reviewed.   Urgent Care Course   Clinical Course      Procedures (including critical care time)  Labs Review Labs Reviewed - No data to display  Imaging Review No results found.   Visual Acuity Review  Right Eye Distance:   Left Eye Distance:   Bilateral Distance:    Right Eye Near:   Left Eye Near:    Bilateral Near:         MDM   1. Strain of lumbar region, initial encounter    Toradol 60mg  IM Naprosyn 500mg  one po bid x 10 days  Flexeril 5mg  one to two po tid prn #30    Lysbeth Penner, FNP 01/21/16 1512

## 2016-01-21 NOTE — ED Triage Notes (Signed)
Pt states she pulled her lower back yesterday when she bent down to pick something up.  She has only tried heat with no relief.

## 2016-01-23 ENCOUNTER — Encounter (HOSPITAL_COMMUNITY): Payer: Non-veteran care

## 2016-01-28 ENCOUNTER — Encounter (HOSPITAL_COMMUNITY)
Admission: RE | Admit: 2016-01-28 | Discharge: 2016-01-28 | Disposition: A | Discharge status: Home / Self Care | Payer: Non-veteran care | Source: Ambulatory Visit | Attending: Pulmonary Disease | Admitting: Pulmonary Disease

## 2016-01-28 VITALS — Wt 125.7 lb

## 2016-01-28 DIAGNOSIS — J449 Chronic obstructive pulmonary disease, unspecified: Secondary | ICD-10-CM | POA: Diagnosis not present

## 2016-01-28 NOTE — Progress Notes (Signed)
Daily Session Note  Patient Details  Name: Patricia Johnston MRN: [id number] Date of Birth: [birth year] Referring Provider:   April Manson Pulmonary Rehab Walk Test from 10/01/2015 in [place]  Referring Provider  Dr. Nelda Marseille      Encounter Date: 01/28/2016  Check In:     Session Check In - 01/28/16 1330      Check-In   Location [facility]   Staff Present Rosebud Poles, RN, BSN;Molly diVincenzo, MS, ACSM RCEP, Exercise Physiologist;Lisa Ysidro Evert, RN;Dinorah Masullo Rollene Rotunda, RN, BSN   Supervising physician immediately available to respond to emergencies [facility] immediately available   Physician(s) Dr. Cathlean Sauer   Medication changes reported     No   Fall or balance concerns reported    No   Warm-up and Cool-down Performed as group-led instruction   Resistance Training Performed Yes   VAD Patient? No     Pain Assessment   Currently in Pain? No/denies   Multiple Pain Sites No      Capillary Blood Glucose: No results found for this or any previous visit (from the past 24 hour(s)).      Exercise Prescription Changes - 01/28/16 1612      Response to Exercise   Blood Pressure (Admit) 96/54   Blood Pressure (Exercise) 118/64   Blood Pressure (Exit) 98/60   Heart Rate (Admit) 75 bpm   Heart Rate (Exercise) 100 bpm   Heart Rate (Exit) 81 bpm   Oxygen Saturation (Admit) 94 %   Oxygen Saturation (Exercise) 93 %   Oxygen Saturation (Exit) 95 %   Rating of Perceived Exertion (Exercise) 11   Perceived Dyspnea (Exercise) 1   Duration Progress to 45 minutes of aerobic exercise without signs/symptoms of physical distress   Intensity THRR unchanged     Progression   Progression Continue to progress workloads to maintain intensity without signs/symptoms of physical distress.     Resistance Training   Training Prescription Yes   Weight orange bands   Reps 10-12  10 minutes of strength training     Interval Training   Interval  Training No     Recumbant Bike   Level 1   Minutes 17     Track   Laps 35   Minutes 34     Goals Met:  Independence with exercise equipment Improved SOB with ADL's Using PLB without cueing & demonstrates good technique Exercise tolerated well No report of cardiac concerns or symptoms Strength training completed today  Goals Unmet:  Not Applicable  Comments: Service time is from 1330 to 1515   Dr. Rush Farmer is Medical Director for Pulmonary Rehab at [facility].

## 2016-01-30 ENCOUNTER — Encounter (HOSPITAL_COMMUNITY)
Admission: RE | Admit: 2016-01-30 | Discharge: 2016-01-30 | Disposition: A | Discharge status: Home / Self Care | Payer: Non-veteran care | Source: Ambulatory Visit | Attending: Pulmonary Disease | Admitting: Pulmonary Disease

## 2016-01-30 VITALS — Wt 125.4 lb

## 2016-01-30 DIAGNOSIS — J449 Chronic obstructive pulmonary disease, unspecified: Secondary | ICD-10-CM | POA: Diagnosis not present

## 2016-01-30 NOTE — Progress Notes (Signed)
Daily Session Note  Patient Details  Name: Patricia Johnston MRN: [id number] Date of Birth: [birth year] Referring Provider:   April Manson Pul[facility] Rehab Walk Test from 10/01/2015 in [place]  Referring Provider  Dr. Nelda Marseille      Encounter Date: 01/30/2016  Check In:     Session Check In - 01/30/16 1330      Check-In   Location [facility]   Staff Present Rosebud Poles, RN, BSN;Molly diVincenzo, MS, ACSM RCEP, Exercise Physiologist;Lisa Ysidro Evert, RN;Portia Rollene Rotunda, RN, BSN   Supervising physician immediately available to respond to emergencies [facility] immediately available   Physician(s) Dr. Ree Kida   Medication changes reported     No   Fall or balance concerns reported    No   Warm-up and Cool-down Performed as group-led instruction   Resistance Training Performed Yes   VAD Patient? No     Pain Assessment   Currently in Pain? No/denies   Multiple Pain Sites No      Capillary Blood Glucose: No results found for this or any previous visit (from the past 24 hour(s)).      Exercise Prescription Changes - 01/30/16 1600      Response to Exercise   Blood Pressure (Admit) 104/60   Blood Pressure (Exercise) 104/56   Blood Pressure (Exit) 104/65   Heart Rate (Admit) 82 bpm   Heart Rate (Exercise) 102 bpm   Heart Rate (Exit) 75 bpm   Oxygen Saturation (Admit) 93 %   Oxygen Saturation (Exercise) 93 %   Oxygen Saturation (Exit) 95 %   Rating of Perceived Exertion (Exercise) 11   Perceived Dyspnea (Exercise) 1   Duration Progress to 45 minutes of aerobic exercise without signs/symptoms of physical distress   Intensity THRR unchanged     Progression   Progression Continue to progress workloads to maintain intensity without signs/symptoms of physical distress.     Resistance Training   Training Prescription Yes   Weight orange bands   Reps 10-12  10 minutes of strength training     Interval Training   Interval  Training No     Recumbant Bike   Level 1   Minutes 17     Track   Laps 19   Minutes 17     Goals Met:  Exercise tolerated well Strength training completed today  Goals Unmet:  Not Applicable  Comments: Service time is from 1330 to 1520    Dr. Rush Farmer is Medical Director for Pulmonary Rehab at [facility].

## 2016-02-04 ENCOUNTER — Encounter (HOSPITAL_COMMUNITY)
Admission: RE | Admit: 2016-02-04 | Discharge: 2016-02-04 | Disposition: A | Discharge status: Home / Self Care | Payer: Non-veteran care | Source: Ambulatory Visit | Attending: Pulmonary Disease | Admitting: Pulmonary Disease

## 2016-02-04 VITALS — Wt 127.2 lb

## 2016-02-04 DIAGNOSIS — M810 Age-related osteoporosis without current pathological fracture: Secondary | ICD-10-CM | POA: Diagnosis not present

## 2016-02-04 DIAGNOSIS — Z79899 Other long term (current) drug therapy: Secondary | ICD-10-CM | POA: Insufficient documentation

## 2016-02-04 DIAGNOSIS — J449 Chronic obstructive pulmonary disease, unspecified: Secondary | ICD-10-CM | POA: Insufficient documentation

## 2016-02-04 DIAGNOSIS — Z7951 Long term (current) use of inhaled steroids: Secondary | ICD-10-CM | POA: Insufficient documentation

## 2016-02-04 NOTE — Progress Notes (Signed)
Pulmonary Individual Treatment Plan  Patient Details  Name: Patricia Johnston MRN: [id number] Date of Birth: 1950/03/04 Referring Provider:   April Manson Pulmonary Rehab Walk Test from 10/01/2015 in [place]  Referring Provider  Dr. Nelda Marseille      Initial Encounter Date:  Flowsheet Row Pulmonary Rehab Walk Test from 10/01/2015 in [place]  Date  10/01/15  Referring Provider  Dr. Nelda Marseille      Visit Diagnosis: Chronic obstructive pulmonary disease, unspecified COPD type ([place])  Patient's Home Medications on Admission:   Current Outpatient Prescriptions:  .  albuterol (PROVENTIL HFA;VENTOLIN HFA) 108 (90 Base) MCG/ACT inhaler, Inhale 1-2 puffs into the lungs every 6 (six) hours as needed for wheezing or shortness of breath., Disp: , Rfl:  .  cyclobenzaprine (FLEXERIL) 5 MG tablet, Take 1-2 po tid prn pain / muscle spasm, Disp: 30 tablet, Rfl: 0 .  Dextromethorphan-Guaifenesin 5-100 MG/5ML LIQD, Take 5 mLs by mouth every 4 (four) hours as needed., Disp: , Rfl:  .  ipratropium-albuterol (DUONEB) 0.5-2.5 (3) MG/3ML SOLN, Take 3 mLs by nebulization every 6 (six) hours as needed., Disp: , Rfl:  .  mometasone (ASMANEX) 220 MCG/INH inhaler, Inhale 2 puffs into the lungs daily., Disp: , Rfl:  .  naproxen (NAPROSYN) 500 MG tablet, Take 1 tablet (500 mg total) by mouth 2 (two) times daily with a meal., Disp: 20 tablet, Rfl: 0 .  OXYGEN, Inhale 2 L into the lungs daily as needed (for exertion). Reported on 04/19/2015, Disp: , Rfl:  .  predniSONE (DELTASONE) 10 MG tablet, Take 5 mg by mouth 2 (two) times daily with a meal. Takes as needed per [place], Disp: , Rfl:  .  Tiotropium Bromide-Olodaterol (STIOLTO RESPIMAT) 2.5-2.5 MCG/ACT AERS, Inhale 2 puffs into the lungs daily., Disp: , Rfl:   Past Medical History: Past Medical History:  Diagnosis Date  . Asthma   . COPD (chronic obstructive pulmonary disease) ([place])    pt states due to [place] contracted COPD  . Osteoporosis     Tobacco Use: History  Smoking Status  . Never Smoker  Smokeless Tobacco  . Never Used    Labs: Recent Review Flowsheet Data    There is no flowsheet data to display.      Capillary Blood Glucose: No results found for: GLUCAP   ADL UCSD:   Pulmonary Function Assessment:     Pulmonary Function Assessment - 09/27/15 1117      Breath   Bilateral Breath Sounds Clear   Shortness of Breath Yes;Limiting activity;Panic with Shortness of Breath      Exercise Target Goals:    Exercise Program Goal: Individual exercise prescription set with THRR, safety & activity barriers. Participant demonstrates ability to understand and report RPE using BORG scale, to self-measure pulse accurately, and to acknowledge the importance of the exercise prescription.  Exercise Prescription Goal: Starting with aerobic activity 30 plus minutes a day, 3 days per week for initial exercise prescription. Provide home exercise prescription and guidelines that participant acknowledges understanding prior to discharge.  Activity Barriers & Risk Stratification:     Activity Barriers & Cardiac Risk Stratification - 09/27/15 1116      Activity Barriers & Cardiac Risk Stratification   Activity Barriers Deconditioning;Arthritis;Shortness of Breath      6 Minute Walk:     6 Minute Walk    Row Name 10/01/15 1631         6 Minute Walk  Phase Initial     Distance 1460 feet     Walk Time 6 minutes     # of Rest Breaks 0     MPH 2.76     METS 3.07     RPE 11     Perceived Dyspnea  1     Symptoms No     Resting HR 92 bpm     Resting BP 100/62     Max Ex. HR 97 bpm     Max Ex. BP 104/60     2 Minute Post BP 104/60       Interval HR   Baseline HR 92     1 Minute HR 95     2 Minute HR 90     3 Minute HR 97     4 Minute HR 93     5 Minute HR 91     6 Minute HR 93     2 Minute Post HR 88     Interval Heart Rate? Yes       Interval Oxygen    Interval Oxygen? Yes     Baseline Oxygen Saturation % 92 %     Baseline Liters of Oxygen 0 L     1 Minute Oxygen Saturation % 88 %     1 Minute Liters of Oxygen 0 L     2 Minute Oxygen Saturation % 88 %     2 Minute Liters of Oxygen 0 L     3 Minute Oxygen Saturation % 91 %     3 Minute Liters of Oxygen 0 L     4 Minute Oxygen Saturation % 92 %     4 Minute Liters of Oxygen 0 L     5 Minute Oxygen Saturation % 91 %     5 Minute Liters of Oxygen 0 L     6 Minute Oxygen Saturation % 94 %     6 Minute Liters of Oxygen 0 L     2 Minute Post Oxygen Saturation % 92 %     2 Minute Post Liters of Oxygen 0 L        Initial Exercise Prescription:     Initial Exercise Prescription - 10/01/15 1600      Date of Initial Exercise RX and Referring Provider   Date 10/01/15   Referring Provider Dr. Nelda Marseille     NuStep   Level 2   Minutes 17   METs 1.7     Rower   Level 1   Minutes 17     Track   Laps 8   Minutes 17     Prescription Details   Frequency (times per week) 2   Duration Progress to 45 minutes of aerobic exercise without signs/symptoms of physical distress     Intensity   THRR 40-80% of Max Heartrate 62-124   Ratings of Perceived Exertion 11-13   Perceived Dyspnea 0-4     Progression   Progression Continue progressive overload as per policy without signs/symptoms or physical distress.     Resistance Training   Training Prescription Yes   Weight orange bands   Reps 10-12      Perform Capillary Blood Glucose checks as needed.  Exercise Prescription Changes:     Exercise Prescription Changes    Row Name 10/08/15 1600 10/10/15 1600 10/15/15 1529 10/17/15 1449 10/29/15 1500     Exercise Review   Progression  -  - Yes  -  -  Response to Exercise   Blood Pressure (Admit) 104/60 120/62 110/60 100/60 96/64   Blood Pressure (Exercise) 98/60 112/60 120/60 110/60 108/60   Blood Pressure (Exit) 100/60 106/60 108/64 110/66 104/60   Heart Rate (Admit) 79 bpm  77 bpm 91 bpm 91 bpm 83 bpm   Heart Rate (Exercise) 109 bpm 110 bpm 104 bpm 110 bpm 104 bpm   Heart Rate (Exit) 90 bpm 77 bpm 88 bpm 90 bpm 81 bpm   Oxygen Saturation (Admit) 91 % 93 % 91 % 93 % 93 %   Oxygen Saturation (Exercise) 89 % 92 % 94 % 90 % 92 %   Oxygen Saturation (Exit) 93 % 94 % 93 % 92 % 93 %   Rating of Perceived Exertion (Exercise) 13 13 15 12 13    Perceived Dyspnea (Exercise) 1 2 3 2 2    Duration Progress to 45 minutes of aerobic exercise without signs/symptoms of physical distress Progress to 45 minutes of aerobic exercise without signs/symptoms of physical distress Progress to 45 minutes of aerobic exercise without signs/symptoms of physical distress Progress to 45 minutes of aerobic exercise without signs/symptoms of physical distress Progress to 45 minutes of aerobic exercise without signs/symptoms of physical distress   Intensity THRR unchanged THRR unchanged THRR unchanged THRR unchanged THRR unchanged     Progression   Progression Continue to progress workloads to maintain intensity without signs/symptoms of physical distress. Continue to progress workloads to maintain intensity without signs/symptoms of physical distress. Continue to progress workloads to maintain intensity without signs/symptoms of physical distress. Continue to progress workloads to maintain intensity without signs/symptoms of physical distress. Continue to progress workloads to maintain intensity without signs/symptoms of physical distress.     Resistance Training   Training Prescription Yes Yes Yes Yes Yes   Weight orange bands orange bands orange bands orange bands orange bands   Reps 10-12  10 minutes of strength training 10-12  10 minutes of strength training 10-12  10 minutes of strength training 10-12  10 minutes of strength training 10-12  10 minutes of strength training     Interval Training   Interval Training No No No No No     NuStep   Level 2  - 3 3 3    Minutes 17  - 17 17 17     METs 2.1  - 2.3 1.9 1.8     Rower   Level 1 1 1   - 1   Minutes 17 17 17   - 17     Track   Laps 20 14 26 16 20    Minutes 17 17 17 17 17    Row Name 10/31/15 1500 11/05/15 1500 11/12/15 1400 11/14/15 1500 11/19/15 1500     Exercise Review   Progression Yes  - Yes  -  -     Response to Exercise   Blood Pressure (Admit) 104/60 126/68 100/64 104/64 104/60   Blood Pressure (Exercise) 126/70 128/70 164/80 110/60 120/76   Blood Pressure (Exit) 104/56 116/60 100/74 84/62  recheck BP 114/52 96/60  recheck BP 114/52   Heart Rate (Admit) 83 bpm 83 bpm 80 bpm 80 bpm 71 bpm   Heart Rate (Exercise) 95 bpm 97 bpm 111 bpm 103 bpm 99 bpm   Heart Rate (Exit) 78 bpm 89 bpm 87 bpm 76 bpm 88 bpm   Oxygen Saturation (Admit) 93 % 93 % 92 % 91 % 92 %   Oxygen Saturation (Exercise) 95 % 92 % 88 % 90 % 93 %  Oxygen Saturation (Exit) 92 % 93 % 97 % 90 % 93 %   Rating of Perceived Exertion (Exercise) 13 17 13 13 13    Perceived Dyspnea (Exercise) 3 3 2 3 2    Duration Progress to 45 minutes of aerobic exercise without signs/symptoms of physical distress Progress to 45 minutes of aerobic exercise without signs/symptoms of physical distress Progress to 45 minutes of aerobic exercise without signs/symptoms of physical distress Progress to 45 minutes of aerobic exercise without signs/symptoms of physical distress Progress to 45 minutes of aerobic exercise without signs/symptoms of physical distress   Intensity THRR unchanged THRR unchanged THRR unchanged THRR unchanged THRR unchanged     Progression   Progression Continue to progress workloads to maintain intensity without signs/symptoms of physical distress. Continue to progress workloads to maintain intensity without signs/symptoms of physical distress. Continue to progress workloads to maintain intensity without signs/symptoms of physical distress. Continue to progress workloads to maintain intensity without signs/symptoms of physical distress. Continue to progress  workloads to maintain intensity without signs/symptoms of physical distress.     Resistance Training   Training Prescription Yes Yes Yes Yes Yes   Weight orange bands orange bands orange bands orange bands orange bands   Reps 10-12  10 minutes of strength training 10-12  10 minutes of strength training 10-12  10 minutes of strength training 10-12  10 minutes of strength training 10-12  10 minutes of strength training     Interval Training   Interval Training No No No No No     NuStep   Level 5 5 6 6 6    Minutes 17 17 17 17 17    METs 1.7 2.5 4 3.6 4.4     Rower   Level 1 2 3   - 3   Minutes 17 17 17   - 17     Track   Laps  - 15 26 11 23    Minutes  - 17 17 17 17      Home Exercise Plan   Plans to continue exercise at  -  -  - Home  -   Frequency  -  -  - Add 3 additional days to program exercise sessions.  -   Row Name 11/21/15 1500 11/28/15 1600 12/03/15 1600 12/05/15 1600 12/10/15 1500     Response to Exercise   Blood Pressure (Admit) 104/56 110/60 96/50 100/58 100/50   Blood Pressure (Exercise) 130/78 140/80 112/60 126/80 120/70   Blood Pressure (Exit) 110/60 100/60 100/58 92/60 108/56   Heart Rate (Admit) 74 bpm 74 bpm 78 bpm 82 bpm 84 bpm   Heart Rate (Exercise) 108 bpm 99 bpm 102 bpm 104 bpm 107 bpm   Heart Rate (Exit) 99 bpm 79 bpm 79 bpm 80 bpm 78 bpm   Oxygen Saturation (Admit) 91 % 93 % 93 % 93 % 93 %   Oxygen Saturation (Exercise) 92 % 91 % 92 % 92 % 90 %   Oxygen Saturation (Exit) 94 % 92 % 93 % 93 % 93 %   Rating of Perceived Exertion (Exercise) 13 14 14 14 17    Perceived Dyspnea (Exercise) 3 2 2 1  3.5   Duration Progress to 45 minutes of aerobic exercise without signs/symptoms of physical distress Progress to 45 minutes of aerobic exercise without signs/symptoms of physical distress Progress to 45 minutes of aerobic exercise without signs/symptoms of physical distress Progress to 45 minutes of aerobic exercise without signs/symptoms of physical distress Progress  to 45 minutes of aerobic exercise  without signs/symptoms of physical distress   Intensity THRR unchanged THRR unchanged THRR unchanged THRR unchanged THRR unchanged     Progression   Progression Continue to progress workloads to maintain intensity without signs/symptoms of physical distress. Continue to progress workloads to maintain intensity without signs/symptoms of physical distress. Continue to progress workloads to maintain intensity without signs/symptoms of physical distress. Continue to progress workloads to maintain intensity without signs/symptoms of physical distress. Continue to progress workloads to maintain intensity without signs/symptoms of physical distress.     Resistance Training   Training Prescription Yes Yes Yes Yes Yes   Weight orange bands orange bands orange bands orange bands orange bands   Reps 10-12  10 minutes of strength training 10-12  10 minutes of strength training 10-12  10 minutes of strength training 10-12  10 minutes of strength training 10-12  10 minutes of strength training     Interval Training   Interval Training No No No No No     NuStep   Level 6  -  -  -  -   Minutes 17  -  -  -  -   METs 4.3  -  -  -  -     Elliptical   Level  - 2 2  - 2   Minutes  - 17 17  - 17     Rower   Level 3  - 3 3 3    Minutes 17  - 17 17 17      Track   Laps  - 15 24 19 20    Minutes  - 17 17 17 17    Row Name 12/12/15 1534 12/17/15 1549 12/19/15 1539 12/24/15 1606 12/26/15 1600     Response to Exercise   Blood Pressure (Admit) 110/54 98/50 104/56 106/70 106/54   Blood Pressure (Exercise) 140/80 110/66 106/62 110/60 120/86   Blood Pressure (Exit) 110/60 102/60 104/58 100/54 98/56   Heart Rate (Admit) 79 bpm 82 bpm 81 bpm 77 bpm 82 bpm   Heart Rate (Exercise) 108 bpm 111 bpm 112 bpm 104 bpm 117 bpm   Heart Rate (Exit) 82 bpm 76 bpm 96 bpm 78 bpm 93 bpm   Oxygen Saturation (Admit) 93 % 94 % 91 % 93 % 94 %   Oxygen Saturation (Exercise) 88 % 90 % 92 % 91 %  91 %   Oxygen Saturation (Exit) 91 % 90 % 91 % 93 % 93 %   Rating of Perceived Exertion (Exercise) 13 13 13 13 13    Perceived Dyspnea (Exercise) 3 2 1 3 3    Duration Progress to 45 minutes of aerobic exercise without signs/symptoms of physical distress Progress to 45 minutes of aerobic exercise without signs/symptoms of physical distress Progress to 45 minutes of aerobic exercise without signs/symptoms of physical distress Progress to 45 minutes of aerobic exercise without signs/symptoms of physical distress Progress to 45 minutes of aerobic exercise without signs/symptoms of physical distress   Intensity THRR unchanged THRR unchanged THRR unchanged THRR unchanged THRR unchanged     Progression   Progression Continue to progress workloads to maintain intensity without signs/symptoms of physical distress. Continue to progress workloads to maintain intensity without signs/symptoms of physical distress. Continue to progress workloads to maintain intensity without signs/symptoms of physical distress. Continue to progress workloads to maintain intensity without signs/symptoms of physical distress. Continue to progress workloads to maintain intensity without signs/symptoms of physical distress.     Resistance Training   Training Prescription Yes Yes Yes  Yes Yes   Weight orange bands orange bands orange bands orange bands orange bands   Reps 10-12  10 minutes of strength training 10-12  10 minutes of strength training 10-12  10 minutes of strength training 10-12  10 minutes of strength training 10-12  10 minutes of strength training     Interval Training   Interval Training No No No No No     Elliptical   Level 2 2 2 2 2    Speed 1 1 1 1 1    Minutes 17 17 17 17 17      Rower   Level  - 3 3 3   -   Minutes  - 17 17 17   -     Track   Laps 16 21  - 53 20   Minutes [place] Name 12/31/15 1609 01/02/16 1600 01/07/16 1534 01/09/16 1600 01/14/16 1500     Exercise Review   Progression   -  -  -  - Yes     Response to Exercise   Blood Pressure (Admit) 98/56 104/68 124/50 124/66 108/60   Blood Pressure (Exercise) 124/60 123/85 100/66 110/68 114/70   Blood Pressure (Exit) 122/64 127/78 114/60 118/70 104/60   Heart Rate (Admit) 81 bpm 76 bpm 88 bpm 75 bpm 76 bpm   Heart Rate (Exercise) 108 bpm 82 bpm 102 bpm 101 bpm 107 bpm   Heart Rate (Exit) 82 bpm 81 bpm 84 bpm 73 bpm 79 bpm   Oxygen Saturation (Admit) 93 % 93 % 88 % 94 % 94 %   Oxygen Saturation (Exercise) 90 % 94 % 89 % 90 % 94 %   Oxygen Saturation (Exit) 92 % 93 % 93 % 93 % 92 %   Rating of Perceived Exertion (Exercise) 13 13 13 13 13    Perceived Dyspnea (Exercise) 3 1 3 3 2    Duration Progress to 45 minutes of aerobic exercise without signs/symptoms of physical distress Progress to 45 minutes of aerobic exercise without signs/symptoms of physical distress Progress to 45 minutes of aerobic exercise without signs/symptoms of physical distress Progress to 45 minutes of aerobic exercise without signs/symptoms of physical distress Progress to 45 minutes of aerobic exercise without signs/symptoms of physical distress   Intensity THRR unchanged THRR unchanged THRR unchanged THRR unchanged THRR unchanged     Progression   Progression Continue to progress workloads to maintain intensity without signs/symptoms of physical distress. Continue to progress workloads to maintain intensity without signs/symptoms of physical distress. Continue to progress workloads to maintain intensity without signs/symptoms of physical distress. Continue to progress workloads to maintain intensity without signs/symptoms of physical distress. Continue to progress workloads to maintain intensity without signs/symptoms of physical distress.     Resistance Training   Training Prescription Yes Yes Yes Yes Yes   Weight orange bands orange bands orange bands orange bands orange bands   Reps 10-12  10 minutes of strength training 10-12  10 minutes of  strength training 10-12  10 minutes of strength training 10-12  10 minutes of strength training 10-12  10 minutes of strength training     Interval Training   Interval Training No No No No No     Elliptical   Level 2  - 2 2 2    Speed 1  - 1 1 1    Minutes 17  - 17 17 17      Rower   Level 3  - 3  - 4  Minutes 17  - 17  - 17     Track   Laps 22 9 9 20 23    Minutes 17 17 17 17 17    Row Name 01/16/16 1500 01/28/16 1612 01/30/16 1600         Response to Exercise   Blood Pressure (Admit) 114/50 96/54 104/60     Blood Pressure (Exercise) 134/62 118/64 104/56     Blood Pressure (Exit) 100/60 98/60 104/65     Heart Rate (Admit) 81 bpm 75 bpm 82 bpm     Heart Rate (Exercise) 113 bpm 100 bpm 102 bpm     Heart Rate (Exit) 95 bpm 81 bpm 75 bpm     Oxygen Saturation (Admit) 93 % 94 % 93 %     Oxygen Saturation (Exercise) 91 % 93 % 93 %     Oxygen Saturation (Exit) 92 % 95 % 95 %     Rating of Perceived Exertion (Exercise) 11 11 11      Perceived Dyspnea (Exercise) 2 1 1      Duration Progress to 45 minutes of aerobic exercise without signs/symptoms of physical distress Progress to 45 minutes of aerobic exercise without signs/symptoms of physical distress Progress to 45 minutes of aerobic exercise without signs/symptoms of physical distress     Intensity THRR unchanged THRR unchanged THRR unchanged       Progression   Progression Continue to progress workloads to maintain intensity without signs/symptoms of physical distress. Continue to progress workloads to maintain intensity without signs/symptoms of physical distress. Continue to progress workloads to maintain intensity without signs/symptoms of physical distress.       Resistance Training   Training Prescription Yes Yes Yes     Weight orange bands orange bands orange bands     Reps 10-12  10 minutes of strength training 10-12  10 minutes of strength training 10-12  10 minutes of strength training       Interval Training    Interval Training No No No       Recumbant Bike   Level  - 1 1     Minutes  - 17 17       Elliptical   Level 2  -  -     Speed 1  -  -     Minutes 17  -  -       Rower   Level 4  -  -     Minutes 17  -  -       Track   Laps  - 35 19     Minutes  - 34 17        Exercise Comments:     Exercise Comments    Row Name 10/10/15 0830 11/11/15 1402 11/14/15 1527 12/09/15 1145 01/06/16 1013   Exercise Comments Patient has only attended one exercise session. First session went very well. Will cont. to monitor and progress.  Patient is progressing well in workloads. Will cont. to monitor. Home exercise complete Patient is progressing well. Is now utilizing the elliptical which she finds challenging but rewarding. Will cont. to monitor.  Patient is progressing well in program. Able to work at high intensities. She gets very concerned when her oxygen saturation drops below 94% while exercising. Educated her that this is not a problem. Will cont. to monitor and progress.   [place] Name 02/03/16 1652           Exercise Comments Patient is doing very well in  program. Able to work at high intensities. Works hard when she is here. Very motivated to make changes. Will cont. to monitor and progress.           Discharge Exercise Prescription (Final Exercise Prescription Changes):     Exercise Prescription Changes - 01/30/16 1600      Response to Exercise   Blood Pressure (Admit) 104/60   Blood Pressure (Exercise) 104/56   Blood Pressure (Exit) 104/65   Heart Rate (Admit) 82 bpm   Heart Rate (Exercise) 102 bpm   Heart Rate (Exit) 75 bpm   Oxygen Saturation (Admit) 93 %   Oxygen Saturation (Exercise) 93 %   Oxygen Saturation (Exit) 95 %   Rating of Perceived Exertion (Exercise) 11   Perceived Dyspnea (Exercise) 1   Duration Progress to 45 minutes of aerobic exercise without signs/symptoms of physical distress   Intensity THRR unchanged     Progression   Progression Continue to progress  workloads to maintain intensity without signs/symptoms of physical distress.     Resistance Training   Training Prescription Yes   Weight orange bands   Reps 10-12  10 minutes of strength training     Interval Training   Interval Training No     Recumbant Bike   Level 1   Minutes 17     Track   Laps 19   Minutes 17       Nutrition:  Target Goals: Understanding of nutrition guidelines, daily intake of sodium 1500mg , cholesterol 200mg , calories 30% from fat and 7% or less from saturated fats, daily to have 5 or more servings of fruits and vegetables.  Biometrics:     Pre Biometrics - 09/27/15 1118      Pre Biometrics   Grip Strength 39 kg       Nutrition Therapy Plan and Nutrition Goals:     Nutrition Therapy & Goals - 10/29/15 1504      Nutrition Therapy   Diet High Calorie, High Protein diet     Personal Nutrition Goals   Personal Goal #1 1-2 lb wt gain/week to a goal wt gain goal of 6-12 lb or a weight of 140-143 lb at graduation from [place], educate and counsel regarding individualized specific dietary modifications aiming towards targeted core components such as weight, hypertension, lipid management, diabetes, heart failure and other comorbidities.;Nutrition handout(s) given to patient.  High Calorie, High Protein diet; Suggestions for increasing calories and protein; High Calorie, High Protein recipes   Expected Outcomes Short Term Goal: A plan has been developed with personal nutrition goals set during dietitian appointment.;Long Term Goal: Adherence to prescribed nutrition plan.      Nutrition Discharge: Rate Your Plate Scores:     Nutrition Assessments - 10/29/15 1504      Rate Your Plate Scores   Pre Score --  returned incomplete. Goal is for pt to gain wt.       Psychosocial: Target Goals: Acknowledge presence or absence of depression, maximize coping skills, provide positive support  system. Participant is able to verbalize types and ability to use techniques and skills needed for reducing stress and depression.  Initial Review & Psychosocial Screening:     Initial Psych Review & Screening - 09/27/15 [place]? Yes     Barriers   Psychosocial barriers to participate in program There are no identifiable barriers or psychosocial needs.  Screening Interventions   Interventions Encouraged to exercise      Quality of Life Scores:   PHQ-9: Recent Review Flowsheet Data    Depression screen [facility] 2/9 09/27/2015   Decreased Interest 0   Down, Depressed, Hopeless 0   PHQ - 2 Score 0      Psychosocial Evaluation and Intervention:     Psychosocial Evaluation - 09/27/15 1122      Psychosocial Evaluation & Interventions   Interventions Encouraged to exercise with the program and follow exercise prescription      Psychosocial Re-Evaluation:     Psychosocial Re-Evaluation    Row Name 10/10/15 0850 11/07/15 [id number] 12/09/15 0842 12/31/15 1127 01/27/16 1216     Psychosocial Re-Evaluation   Interventions Encouraged to attend Pulmonary Rehabilitation for the exercise Encouraged to attend Pulmonary Rehabilitation for the exercise Encouraged to attend Pulmonary Rehabilitation for the exercise Encouraged to attend Pulmonary Rehabilitation for the exercise Encouraged to attend Pulmonary Rehabilitation for the exercise   Comments no psychosocial issues identified at this time None identified at this time Continues to have no psychosocial issues at this time. no psychosocial issues identified. no psychosocial issues identified at this time.     Continued Psychosocial Services Needed No No No No No     Education: Education Goals: Education classes will be provided on a weekly basis, covering required topics. Participant will state understanding/return demonstration of topics presented.  Learning Barriers/Preferences:     Learning  Barriers/Preferences - 09/27/15 1116      Learning Barriers/Preferences   Learning Barriers None   Learning Preferences Written Material;Computer/Internet;Verbal Instruction;Audio      Education Topics: Risk Factor Reduction:  -Group instruction that is supported by a PowerPoint presentation. Instructor discusses the definition of a risk factor, different risk factors for pulmonary disease, and how the heart and lungs work together.     Nutrition for Pulmonary Patient:  -Group instruction provided by PowerPoint slides, verbal discussion, and written materials to support subject matter. The instructor gives an explanation and review of healthy diet recommendations, which includes a discussion on weight management, recommendations for fruit and vegetable consumption, as well as protein, fluid, caffeine, fiber, sodium, sugar, and alcohol. Tips for eating when patients are short of breath are discussed. Flowsheet Row PULMONARY REHAB CHRONIC OBSTRUCTIVE PULMONARY DISEASE from 01/30/2016 in [place]  Date  01/09/16 [facility] Eating During the [place]  Educator  RD  Instruction Review Code  2- meets goals/outcomes      Pursed Lip Breathing:  -Group instruction that is supported by demonstration and informational handouts. Instructor discusses the benefits of pursed lip and diaphragmatic breathing and detailed demonstration on how to preform both.   Flowsheet Row PULMONARY REHAB CHRONIC OBSTRUCTIVE PULMONARY DISEASE from 01/30/2016 in [place]  Date  12/12/15  Educator  EP  Instruction Review Code  2- meets goals/outcomes      Oxygen Safety:  -Group instruction provided by PowerPoint, verbal discussion, and written material to support subject matter. There is an overview of "What is Oxygen" and "Why do we need it".  Instructor also reviews how to create a safe environment for oxygen use, the importance of using oxygen as  prescribed, and the risks of noncompliance. There is a brief discussion on traveling with oxygen and resources the patient may utilize. Flowsheet Row PULMONARY REHAB CHRONIC OBSTRUCTIVE PULMONARY DISEASE from 01/30/2016 in [place]  Date  01/02/16  Educator  rn  Instruction  Review Code  2- meets goals/outcomes      Oxygen Equipment:  -Group instruction provided by [employer] Staff utilizing handouts, written materials, and equipment demonstrations. Flowsheet Row PULMONARY REHAB CHRONIC OBSTRUCTIVE PULMONARY DISEASE from 01/30/2016 in [place]  Date  01/16/16  Educator  Rep  Instruction Review Code  2- meets goals/outcomes      Signs and Symptoms:  -Group instruction provided by written material and verbal discussion to support subject matter. Warning signs and symptoms of infection, stroke, and heart attack are reviewed and when to call the physician/911 reinforced. Tips for preventing the spread of infection discussed. Flowsheet Row PULMONARY REHAB CHRONIC OBSTRUCTIVE PULMONARY DISEASE from 01/30/2016 in [place]  Date  11/28/15  Educator  rn  Instruction Review Code  2- meets goals/outcomes      Advanced Directives:  -Group instruction provided by verbal instruction and written material to support subject matter. Instructor reviews Advanced Directive laws and proper instruction for filling out document.   Pulmonary Video:  -Group video education that reviews the importance of medication and oxygen compliance, exercise, good nutrition, pulmonary hygiene, and pursed lip and diaphragmatic breathing for the pulmonary patient. Flowsheet Row PULMONARY REHAB CHRONIC OBSTRUCTIVE PULMONARY DISEASE from 01/30/2016 in [place]  Date  12/19/15  Educator  video  Instruction Review Code  2- meets goals/outcomes      Exercise for the Pulmonary Patient:   -Group instruction that is supported by a PowerPoint presentation. Instructor discusses benefits of exercise, core components of exercise, frequency, duration, and intensity of an exercise routine, importance of utilizing pulse oximetry during exercise, safety while exercising, and options of places to exercise outside of rehab.   Flowsheet Row PULMONARY REHAB CHRONIC OBSTRUCTIVE PULMONARY DISEASE from 01/30/2016 in [place]  Date  01/30/16  Educator  EP  Instruction Review Code  2- meets goals/outcomes      Pulmonary Medications:  -Verbally interactive group education provided by instructor with focus on inhaled medications and proper administration.   Anatomy and Physiology of the Respiratory System and Intimacy:  -Group instruction provided by PowerPoint, verbal discussion, and written material to support subject matter. Instructor reviews respiratory cycle and anatomical components of the respiratory system and their functions. Instructor also reviews differences in obstructive and restrictive respiratory diseases with examples of each. Intimacy, Sex, and Sexuality differences are reviewed with a discussion on how relationships can change when diagnosed with pulmonary disease. Common sexual concerns are reviewed.   Knowledge Questionnaire Score:   Core Components/Risk Factors/Patient Goals at Admission:     Personal Goals and Risk Factors at Admission - 09/27/15 1120      Core Components/Risk Factors/Patient Goals on Admission   Increase Strength and Stamina Yes   Intervention Provide advice, education, support and counseling about physical activity/exercise needs.;Develop an individualized exercise prescription for aerobic and resistive training based on initial evaluation findings, risk stratification, comorbidities and participant's personal goals.   Expected Outcomes Achievement of increased cardiorespiratory fitness and enhanced flexibility,  muscular endurance and strength shown through measurements of functional capacity and personal statement of participant.   Improve shortness of breath with ADL's Yes   Intervention Provide education, individualized exercise plan and daily activity instruction to help decrease symptoms of SOB with activities of daily living.   Expected Outcomes Short Term: Achieves a reduction of symptoms when performing activities of daily living.   Develop more efficient breathing techniques such as  purse lipped breathing and diaphragmatic breathing; and practicing self-pacing with activity Yes   Intervention Provide education, demonstration and support about specific breathing techniuqes utilized for more efficient breathing. Include techniques such as pursed lipped breathing, diaphragmatic breathing and self-pacing activity.   Expected Outcomes Short Term: Participant will be able to demonstrate and use breathing techniques as needed throughout daily activities.   Increase knowledge of respiratory medications and ability to use respiratory devices properly  Yes   Intervention Provide education and demonstration as needed of appropriate use of medications, inhalers, and oxygen therapy.   Expected Outcomes Short Term: Achieves understanding of medications use. Understands that oxygen is a medication prescribed by physician. Demonstrates appropriate use of inhaler and oxygen therapy.      Core Components/Risk Factors/Patient Goals Review:      Goals and Risk Factor Review    Row Name 10/10/15 0848 11/07/15 0810 12/09/15 0840 12/31/15 1123 01/27/16 1210     Core Components/Risk Factors/Patient Goals Review   Personal Goals Review  - Develop more efficient breathing techniques such as purse lipped breathing and diaphragmatic breathing and practicing self-pacing with activity.;Increase Strength and Stamina;Improve shortness of breath with ADL's;Increase knowledge of respiratory medications and ability to use  respiratory devices properly. Improve shortness of breath with ADL's;Increase Strength and Stamina Increase Strength and Stamina;Improve shortness of breath with ADL's Improve shortness of breath with ADL's;Increase knowledge of respiratory medications and ability to use respiratory devices properly.   Review Has only exercised in 1 session, too early to meet any goals Progressing well despite COPD exacerbation, on second round of antibiotics, nustep level 5, track 15 laps, rower level 2. Continues to progress, up to 19 laps on track, rower level 3, nustep level 6.  Attendance regular. Workloads have increased to 20-21 laps on track, is exercising on the ellipitical , and level 3 on the rower, making good progress Continues to improve, , level 4 on rower, 23 laps on track, and 2/1 on ellipitcal    Expected Outcomes Should see improvement by the next review Progressing well Continue to increase workloads as tolerated. to be much stronger at graduation than at beginning of program continue to improve and will continue to exercise after graduating from pulmonary rehab      Core Components/Risk Factors/Patient Goals at Discharge (Final Review):      Goals and Risk Factor Review - 01/27/16 1210      Core Components/Risk Factors/Patient Goals Review   Personal Goals Review Improve shortness of breath with ADL's;Increase knowledge of respiratory medications and ability to use respiratory devices properly.   Review Continues to improve, , level 4 on rower, 23 laps on track, and 2/1 on ellipitcal    Expected Outcomes continue to improve and will continue to exercise after graduating from pulmonary rehab      ITP Comments:   Comments: ITP REVIEW Pt is making expected progress toward pulmonary rehab goals after completing 29 sessions. Recommend continued exercise, life style modification, education, and utilization of breathing techniques to increase stamina and strength and decrease shortness of breath  with exertion.

## 2016-02-04 NOTE — Progress Notes (Signed)
Daily Session Note  Patient Details  Name: Patricia Johnston MRN: [id number] Date of Birth: 01-17-1951 Referring Provider:   April Manson Pulmonary Rehab Walk Test from 10/01/2015 in [place]  Referring Provider  Dr. Nelda Marseille      Encounter Date: 02/04/2016  Check In:     Session Check In - 02/04/16 1514      Check-In   Location [facility]   Staff Present [name], RN, Maxcine Ham, RN, BSN;Molly diVincenzo, MS, ACSM RCEP, Exercise Physiologist;Lisa Ysidro Evert, RN   Supervising physician immediately available to respond to emergencies [facility] immediately available   Physician(s) Dr. Ree Kida   Medication changes reported     No   Fall or balance concerns reported    No   Warm-up and Cool-down Performed as group-led instruction   Resistance Training Performed Yes   VAD Patient? No     Pain Assessment   Currently in Pain? No/denies   Multiple Pain Sites No      Capillary Blood Glucose: No results found for this or any previous visit (from the past 24 hour(s)).      Exercise Prescription Changes - 02/04/16 1600      Exercise Review   Progression Yes     Response to Exercise   Blood Pressure (Admit) 100/50   Blood Pressure (Exercise) 104/60   Blood Pressure (Exit) 100/60   Heart Rate (Admit) 84 bpm   Heart Rate (Exercise) 104 bpm   Heart Rate (Exit) 72 bpm   Oxygen Saturation (Admit) 95 %   Oxygen Saturation (Exercise) 95 %   Oxygen Saturation (Exit) 96 %   Rating of Perceived Exertion (Exercise) 11   Perceived Dyspnea (Exercise) 1   Duration Progress to 45 minutes of aerobic exercise without signs/symptoms of physical distress   Intensity THRR unchanged     Progression   Progression Continue to progress workloads to maintain intensity without signs/symptoms of physical distress.     Resistance Training   Training Prescription Yes   Weight orange bands   Reps 10-12  10 minutes of strength training      Interval Training   Interval Training No     Recumbant Bike   Level 3   Minutes 34     Track   Laps 24   Minutes 17     Goals Met:  Exercise tolerated well Strength training completed today  Goals Unmet:  Not Applicable  Comments: Service time is from 1330 to [place]    Dr. Rush Farmer is Medical Director for Pulmonary Rehab at [facility].

## 2016-02-06 ENCOUNTER — Encounter (HOSPITAL_COMMUNITY)
Admission: RE | Admit: 2016-02-06 | Discharge: 2016-02-06 | Disposition: A | Discharge status: Home / Self Care | Payer: Non-veteran care | Source: Ambulatory Visit | Attending: Pulmonary Disease | Admitting: Pulmonary Disease

## 2016-02-06 VITALS — Wt 127.9 lb

## 2016-02-06 DIAGNOSIS — J449 Chronic obstructive pulmonary disease, unspecified: Secondary | ICD-10-CM

## 2016-02-06 NOTE — Progress Notes (Signed)
Daily Session Note  Patient Details  Name: Patricia Johnston MRN: [id number] Date of Birth: 1950/09/10 Referring Provider:   April Manson Pulmonary Rehab Walk Test from 10/01/2015 in [place]  Referring Provider  Dr. Nelda Marseille      Encounter Date: 02/06/2016  Check In:     Session Check In - 02/06/16 1352      Check-In   Location [facility]   Staff Present Rosebud Poles, RN, BSN;Molly diVincenzo, MS, ACSM RCEP, Exercise Physiologist;Lisa Ysidro Evert, RN;Jakiah Bienaime Rollene Rotunda, RN, BSN   Supervising physician immediately available to respond to emergencies [facility] immediately available   Physician(s) Dr. Allyson Sabal   Medication changes reported     No   Fall or balance concerns reported    No   Warm-up and Cool-down Performed as group-led instruction   Resistance Training Performed Yes   VAD Patient? No     Pain Assessment   Currently in Pain? No/denies   Multiple Pain Sites No      Capillary Blood Glucose: No results found for this or any previous visit (from the past 24 hour(s)).      Exercise Prescription Changes - 02/06/16 1600      Response to Exercise   Blood Pressure (Admit) 114/48   Blood Pressure (Exercise) 118/82   Blood Pressure (Exit) 100/60   Heart Rate (Admit) 77 bpm   Heart Rate (Exercise) 90 bpm   Heart Rate (Exit) 67 bpm   Oxygen Saturation (Admit) 98 %   Oxygen Saturation (Exercise) 93 %   Oxygen Saturation (Exit) 95 %   Rating of Perceived Exertion (Exercise) 12   Perceived Dyspnea (Exercise) 1   Duration Progress to 45 minutes of aerobic exercise without signs/symptoms of physical distress   Intensity THRR unchanged     Progression   Progression Continue to progress workloads to maintain intensity without signs/symptoms of physical distress.     Resistance Training   Training Prescription Yes   Weight orange bands   Reps 10-12  10 minutes of strength training     Interval Training   Interval  Training No     Recumbant Bike   Level 3   Minutes 17     Track   Laps 23   Minutes 17     Goals Met:  Improved SOB with ADL's Using PLB without cueing & demonstrates good technique Exercise tolerated well No report of cardiac concerns or symptoms Strength training completed today  Goals Unmet:  Not Applicable  Comments: Service time is from 1330 to 1530   Dr. Rush Farmer is Medical Director for Pulmonary Rehab at [facility].

## 2016-02-11 ENCOUNTER — Encounter (HOSPITAL_COMMUNITY)
Admission: RE | Admit: 2016-02-11 | Discharge: 2016-02-11 | Disposition: A | Discharge status: Home / Self Care | Payer: Non-veteran care | Source: Ambulatory Visit | Attending: Pulmonary Disease | Admitting: Pulmonary Disease

## 2016-02-11 VITALS — Wt 128.7 lb

## 2016-02-11 DIAGNOSIS — J449 Chronic obstructive pulmonary disease, unspecified: Secondary | ICD-10-CM | POA: Diagnosis not present

## 2016-02-11 NOTE — Progress Notes (Signed)
Daily Session Note  Patient Details  Name: Patricia Johnston MRN: [id number] Date of Birth: [birth year] Referring Provider:   April Manson Pulmonary Rehab Walk Test from 10/01/2015 in [place]  Referring Provider  Dr. Nelda Marseille      Encounter Date: 02/11/2016  Check In:     Session Check In - 02/11/16 1330      Check-In   Location [facility]   Staff Present Rosebud Poles, RN, BSN;Molly diVincenzo, MS, ACSM RCEP, Exercise Physiologist;Lisa Ysidro Evert, RN;Portia Rollene Rotunda, RN, BSN   Supervising physician immediately available to respond to emergencies [facility] immediately available   Physician(s) Dr. Nada Maclachlan   Medication changes reported     No   Fall or balance concerns reported    No   Warm-up and Cool-down Performed as group-led instruction   Resistance Training Performed Yes   VAD Patient? No     Pain Assessment   Currently in Pain? No/denies   Multiple Pain Sites No      Capillary Blood Glucose: No results found for this or any previous visit (from the past 24 hour(s)).      Exercise Prescription Changes - 02/11/16 1500      Response to Exercise   Blood Pressure (Admit) 100/62   Blood Pressure (Exercise) 154/66   Blood Pressure (Exit) 96/60   Heart Rate (Admit) 80 bpm   Heart Rate (Exercise) 118 bpm   Heart Rate (Exit) 80 bpm   Oxygen Saturation (Admit) 96 %   Oxygen Saturation (Exercise) 93 %   Oxygen Saturation (Exit) 95 %   Rating of Perceived Exertion (Exercise) 11   Perceived Dyspnea (Exercise) 1   Duration Progress to 45 minutes of aerobic exercise without signs/symptoms of physical distress   Intensity THRR unchanged     Progression   Progression Continue to progress workloads to maintain intensity without signs/symptoms of physical distress.     Resistance Training   Training Prescription Yes   Weight orange bands   Reps 10-12  10 minutes of stregth training     Interval Training   Interval  Training No     Recumbant Bike   Level 3   Minutes 17     Track   Laps 39   Minutes 34     Goals Met:  Exercise tolerated well Strength training completed today  Goals Unmet:  Not Applicable  Comments: Service time is from 1330 to 1500    Dr. Rush Farmer is Medical Director for Pulmonary Rehab at [facility].

## 2016-02-13 ENCOUNTER — Encounter (HOSPITAL_COMMUNITY)
Admission: RE | Admit: 2016-02-13 | Discharge: 2016-02-13 | Disposition: A | Discharge status: Home / Self Care | Payer: Non-veteran care | Source: Ambulatory Visit | Attending: Pulmonary Disease | Admitting: Pulmonary Disease

## 2016-02-13 VITALS — Wt 128.7 lb

## 2016-02-13 DIAGNOSIS — J449 Chronic obstructive pulmonary disease, unspecified: Secondary | ICD-10-CM | POA: Diagnosis not present

## 2016-02-13 NOTE — Progress Notes (Signed)
Daily Session Note  Patient Details  Name: Patricia Johnston MRN: [id number] Date of Birth: Oct 30, 1950 Referring Provider:   April Manson Pulmonary Rehab Walk Test from 10/01/2015 in [place]  Referring Provider  Dr. Nelda Marseille      Encounter Date: 02/13/2016  Check In:     Session Check In - 02/13/16 1349      Check-In   Location [facility]   Staff Present Su Hilt, MS, ACSM RCEP, Exercise Physiologist;Joan Leonia Reeves, RN, Luisa Hart, RN, BSN   Supervising physician immediately available to respond to emergencies [facility] immediately available   Physician(s) Dr. Karleen [place]   Medication changes reported     No   Fall or balance concerns reported    No   Warm-up and Cool-down Performed as group-led instruction   Resistance Training Performed Yes   VAD Patient? No     Pain Assessment   Currently in Pain? No/denies   Multiple Pain Sites No      Capillary Blood Glucose: No results found for this or any previous visit (from the past 24 hour(s)).      Exercise Prescription Changes - 02/13/16 1545      Response to Exercise   Blood Pressure (Admit) 140/60   Blood Pressure (Exercise) 100/64   Blood Pressure (Exit) 110/60   Heart Rate (Admit) 100 bpm   Heart Rate (Exercise) 114 bpm   Heart Rate (Exit) 84 bpm   Oxygen Saturation (Admit) 95 %   Oxygen Saturation (Exercise) 92 %   Oxygen Saturation (Exit) 94 %   Rating of Perceived Exertion (Exercise) 13   Perceived Dyspnea (Exercise) 1.5   Duration Progress to 45 minutes of aerobic exercise without signs/symptoms of physical distress   Intensity THRR unchanged     Progression   Progression Continue to progress workloads to maintain intensity without signs/symptoms of physical distress.     Resistance Training   Training Prescription Yes   Weight orange bands   Reps 10-12  10 minutes of stregth training     Interval Training   Interval Training No     Recumbant Bike   Level 3   Minutes 17     Track   Laps 27   Minutes 17     Goals Met:  [place] with exercise equipment Improved SOB with ADL's Exercise tolerated well No report of cardiac concerns or symptoms Strength training completed today  Goals Unmet:  Not Applicable  Comments: Service time is from 1330 to 1520   Dr. Rush Farmer is Medical Director for Pulmonary Rehab at [facility].

## 2016-02-18 ENCOUNTER — Encounter (HOSPITAL_COMMUNITY)
Admission: RE | Admit: 2016-02-18 | Discharge: 2016-02-18 | Disposition: A | Discharge status: Home / Self Care | Payer: Non-veteran care | Source: Ambulatory Visit | Attending: Pulmonary Disease | Admitting: Pulmonary Disease

## 2016-02-18 DIAGNOSIS — J449 Chronic obstructive pulmonary disease, unspecified: Secondary | ICD-10-CM | POA: Diagnosis not present

## 2016-02-27 ENCOUNTER — Encounter (HOSPITAL_COMMUNITY): Payer: Self-pay | Admitting: *Deleted

## 2016-03-17 NOTE — Addendum Note (Signed)
Encounter addended by: Lance Morin, RN on: 03/17/2016 10:10 AM<BR>    Actions taken: Episode resolved

## 2016-03-17 NOTE — Progress Notes (Signed)
Discharge Summary  Patient Details  Name: Patricia Johnston MRN: [id number] Date of Birth: [birth year] Referring Provider:   [facility] Walk Test from 10/01/2015 in [place]  Referring Provider  Dr. Nelda Marseille       Number of Visits: 33  Reason for Discharge:  Patient reached a stable level of exercise.  Smoking History:  History  Smoking Status  . Never Smoker  Smokeless Tobacco  . Never Used    Diagnosis:  Chronic obstructive pulmonary disease, unspecified COPD type ([place])  ADL UCSD:     Pulmonary Assessment Scores    Row Name 02/27/16 1729         ADL UCSD   ADL Phase Exit     SOB Score total 56        Initial Exercise Prescription:     Initial Exercise Prescription - 10/01/15 1600      Date of Initial Exercise RX and Referring Provider   Date 10/01/15   Referring Provider Dr. Nelda Marseille     NuStep   Level 2   Minutes 17   METs 1.7     Rower   Level 1   Minutes 17     Track   Laps 8   Minutes 17     Prescription Details   Frequency (times per week) 2   Duration Progress to 45 minutes of aerobic exercise without signs/symptoms of physical distress     Intensity   THRR 40-80% of Max Heartrate 62-124   Ratings of Perceived Exertion 11-13   Perceived Dyspnea 0-4     Progression   Progression Continue progressive overload as per policy without signs/symptoms or physical distress.     Resistance Training   Training Prescription Yes   Weight orange bands   Reps 10-12      Discharge Exercise Prescription (Final Exercise Prescription Changes):     Exercise Prescription Changes - 02/13/16 1545      Response to Exercise   Blood Pressure (Admit) 140/60   Blood Pressure (Exercise) 100/64   Blood Pressure (Exit) 110/60   Heart Rate (Admit) 100 bpm   Heart Rate (Exercise) 114 bpm   Heart Rate (Exit) 84 bpm   Oxygen Saturation (Admit) 95 %   Oxygen Saturation (Exercise) 92 %   Oxygen Saturation  (Exit) 94 %   Rating of Perceived Exertion (Exercise) 13   Perceived Dyspnea (Exercise) 1.5   Duration Progress to 45 minutes of aerobic exercise without signs/symptoms of physical distress   Intensity THRR unchanged     Progression   Progression Continue to progress workloads to maintain intensity without signs/symptoms of physical distress.     Resistance Training   Training Prescription Yes   Weight orange bands   Reps 10-12  10 minutes of stregth training     Interval Training   Interval Training No     Recumbant Bike   Level 3   Minutes 17     Track   Laps 27   Minutes 17      Functional Capacity:     6 Minute Walk    Row Name 10/01/15 1631 02/18/16 1702       6 Minute Walk   Phase Initial Discharge    Distance 1460 feet 2137 feet    Walk Time 6 minutes 6 minutes    # of Rest Breaks 0 0    MPH 2.76 4.04    METS 3.07 4.06    RPE  11 13    Perceived Dyspnea  1 3    Symptoms No No    Resting HR 92 bpm 95 bpm    Resting BP 100/62 108/62    Max Ex. HR 97 bpm 129 bpm    Max Ex. BP 104/60 160/80    2 Minute Post BP 104/60 138/70      Interval HR   Baseline HR 92 90    1 Minute HR 95 90    2 Minute HR 90 90    3 Minute HR 97 98    4 Minute HR 93 106    5 Minute HR 91 106    6 Minute HR 93 129    2 Minute Post HR 88 104    Interval Heart Rate? Yes Yes      Interval Oxygen   Interval Oxygen? Yes Yes    Baseline Oxygen Saturation % 92 % 95 %    Baseline Liters of Oxygen 0 L 0 L    1 Minute Oxygen Saturation % 88 % 93 %    1 Minute Liters of Oxygen 0 L 0 L    2 Minute Oxygen Saturation % 88 % 93 %    2 Minute Liters of Oxygen 0 L 0 L    3 Minute Oxygen Saturation % 91 % 92 %    3 Minute Liters of Oxygen 0 L 0 L    4 Minute Oxygen Saturation % 92 % 93 %    4 Minute Liters of Oxygen 0 L 0 L    5 Minute Oxygen Saturation % 91 % 93 %    5 Minute Liters of Oxygen 0 L 0 L    6 Minute Oxygen Saturation % 94 % 94 %    6 Minute Liters of Oxygen 0 L 0 L     2 Minute Post Oxygen Saturation % 92 % 96 %    2 Minute Post Liters of Oxygen 0 L 0 L       Psychological, QOL, Others - Outcomes: PHQ 2/9: Depression screen PHQ 2/9 09/27/2015  Decreased Interest 0  Down, Depressed, Hopeless 0  PHQ - 2 Score 0    Quality of Life:     Quality of Life - 02/27/16 1731      Quality of Life Scores   Health/Function Post 21.86 %   Socioeconomic Post 27.86 %   Psych/Spiritual Post 28.79 %   Family Post 30 %   GLOBAL Post 25.56 %      Personal Goals: Goals established at orientation with interventions provided to work toward goal.     Personal Goals and Risk Factors at Admission - 09/27/15 1120      Core Components/Risk Factors/Patient Goals on Admission   Increase Strength and Stamina Yes   Intervention Provide advice, education, support and counseling about physical activity/exercise needs.;Develop an individualized exercise prescription for aerobic and resistive training based on initial evaluation findings, risk stratification, comorbidities and participant's personal goals.   Expected Outcomes Achievement of increased cardiorespiratory fitness and enhanced flexibility, muscular endurance and strength shown through measurements of functional capacity and personal statement of participant.   Improve shortness of breath with ADL's Yes   Intervention Provide education, individualized exercise plan and daily activity instruction to help decrease symptoms of SOB with activities of daily living.   Expected Outcomes Short Term: Achieves a reduction of symptoms when performing activities of daily living.   Develop more efficient breathing techniques such as  purse lipped breathing and diaphragmatic breathing; and practicing self-pacing with activity Yes   Intervention Provide education, demonstration and support about specific breathing techniuqes utilized for more efficient breathing. Include techniques such as pursed lipped breathing, diaphragmatic  breathing and self-pacing activity.   Expected Outcomes Short Term: Participant will be able to demonstrate and use breathing techniques as needed throughout daily activities.   Increase knowledge of respiratory medications and ability to use respiratory devices properly  Yes   Intervention Provide education and demonstration as needed of appropriate use of medications, inhalers, and oxygen therapy.   Expected Outcomes Short Term: Achieves understanding of medications use. Understands that oxygen is a medication prescribed by physician. Demonstrates appropriate use of inhaler and oxygen therapy.       Personal Goals Discharge:     Goals and Risk Factor Review    Row Name 10/10/15 0848 11/07/15 0810 12/09/15 0840 12/31/15 1123 01/27/16 1210     Core Components/Risk Factors/Patient Goals Review   Personal Goals Review  - Develop more efficient breathing techniques such as purse lipped breathing and diaphragmatic breathing and practicing self-pacing with activity.;Increase Strength and Stamina;Improve shortness of breath with ADL's;Increase knowledge of respiratory medications and ability to use respiratory devices properly. Improve shortness of breath with ADL's;Increase Strength and Stamina Increase Strength and Stamina;Improve shortness of breath with ADL's Improve shortness of breath with ADL's;Increase knowledge of respiratory medications and ability to use respiratory devices properly.   Review Has only exercised in 1 session, too early to meet any goals Progressing well despite COPD exacerbation, on second round of antibiotics, nustep level 5, track 15 laps, rower level 2. Continues to progress, up to 19 laps on track, rower level 3, nustep level 6.  Attendance regular. Workloads have increased to 20-21 laps on track, is exercising on the ellipitical , and level 3 on the rower, making good progress Continues to improve, , level 4 on rower, 23 laps on track, and 2/1 on ellipitcal    Expected  Outcomes Should see improvement by the next review Progressing well Continue to increase workloads as tolerated. to be much stronger at graduation than at beginning of program continue to improve and will continue to exercise after graduating from pulmonary rehab      Nutrition & Weight - Outcomes:     Pre Biometrics - 09/27/15 1118      Pre Biometrics   Grip Strength 39 kg       Nutrition:     Nutrition Therapy & Goals - 10/29/15 1504      Nutrition Therapy   Diet High Calorie, High Protein diet     Personal Nutrition Goals   Personal Goal #1 1-2 lb wt gain/week to a goal wt gain goal of 6-12 lb or a weight of 140-143 lb at graduation from [facility], educate and counsel regarding individualized specific dietary modifications aiming towards targeted core components such as weight, hypertension, lipid management, diabetes, heart failure and other comorbidities.;Nutrition handout(s) given to patient.  High Calorie, High Protein diet; Suggestions for increasing calories and protein; High Calorie, High Protein recipes   Expected Outcomes Short Term Goal: A plan has been developed with personal nutrition goals set during dietitian appointment.;Long Term Goal: Adherence to prescribed nutrition plan.      Nutrition Discharge:     Nutrition Assessments - 03/13/16 0913      Rate Your Plate Scores   Post Score 58  Goal is for  wt gain. Low score < 49 is indicated for pt to gain wt      Education Questionnaire Score:     Knowledge Questionnaire Score - 02/27/16 1729      Knowledge Questionnaire Score   Post Score 13/13      Goals reviewed with patient; copy given to patient.

## 2016-03-17 NOTE — Addendum Note (Signed)
Encounter addended by: Lance Morin, RN on: 03/17/2016 10:06 AM<BR>    Actions taken: Sign clinical note

## 2016-05-22 ENCOUNTER — Encounter (HOSPITAL_COMMUNITY): Payer: Self-pay | Admitting: Oncology

## 2016-05-22 ENCOUNTER — Emergency Department (HOSPITAL_COMMUNITY): Payer: Medicare Other

## 2016-05-22 ENCOUNTER — Inpatient Hospital Stay (HOSPITAL_COMMUNITY)
Admission: EM | Admit: 2016-05-22 | Discharge: 2016-05-26 | DRG: 202 | Disposition: A | Discharge status: Home / Self Care | Payer: Medicare Other | Attending: Internal Medicine | Admitting: Internal Medicine

## 2016-05-22 DIAGNOSIS — Z7952 Long term (current) use of systemic steroids: Secondary | ICD-10-CM

## 2016-05-22 DIAGNOSIS — J441 Chronic obstructive pulmonary disease with (acute) exacerbation: Secondary | ICD-10-CM

## 2016-05-22 DIAGNOSIS — Z9981 Dependence on supplemental oxygen: Secondary | ICD-10-CM

## 2016-05-22 DIAGNOSIS — M810 Age-related osteoporosis without current pathological fracture: Secondary | ICD-10-CM | POA: Diagnosis present

## 2016-05-22 DIAGNOSIS — Z90710 Acquired absence of both cervix and uterus: Secondary | ICD-10-CM

## 2016-05-22 DIAGNOSIS — J069 Acute upper respiratory infection, unspecified: Secondary | ICD-10-CM | POA: Diagnosis not present

## 2016-05-22 DIAGNOSIS — J439 Emphysema, unspecified: Secondary | ICD-10-CM | POA: Diagnosis present

## 2016-05-22 DIAGNOSIS — Z79899 Other long term (current) drug therapy: Secondary | ICD-10-CM | POA: Diagnosis not present

## 2016-05-22 DIAGNOSIS — R0602 Shortness of breath: Secondary | ICD-10-CM | POA: Diagnosis not present

## 2016-05-22 DIAGNOSIS — R05 Cough: Secondary | ICD-10-CM

## 2016-05-22 DIAGNOSIS — J209 Acute bronchitis, unspecified: Secondary | ICD-10-CM | POA: Diagnosis present

## 2016-05-22 DIAGNOSIS — J9621 Acute and chronic respiratory failure with hypoxia: Secondary | ICD-10-CM | POA: Diagnosis not present

## 2016-05-22 DIAGNOSIS — Z888 Allergy status to other drugs, medicaments and biological substances status: Secondary | ICD-10-CM | POA: Diagnosis not present

## 2016-05-22 DIAGNOSIS — Z7951 Long term (current) use of inhaled steroids: Secondary | ICD-10-CM

## 2016-05-22 DIAGNOSIS — F419 Anxiety disorder, unspecified: Secondary | ICD-10-CM | POA: Diagnosis present

## 2016-05-22 DIAGNOSIS — R059 Cough, unspecified: Secondary | ICD-10-CM

## 2016-05-22 LAB — BASIC METABOLIC PANEL
Anion gap: 6 (ref 5–15)
BUN: 10 mg/dL (ref 6–20)
CALCIUM: 9.5 mg/dL (ref 8.9–10.3)
CHLORIDE: 108 mmol/L (ref 101–111)
CO2: 29 mmol/L (ref 22–32)
CREATININE: 0.82 mg/dL (ref 0.44–1.00)
Glucose, Bld: 115 mg/dL — ABNORMAL HIGH (ref 65–99)
Potassium: 4.3 mmol/L (ref 3.5–5.1)
SODIUM: 143 mmol/L (ref 135–145)

## 2016-05-22 LAB — CBC
HCT: 41.7 % (ref 36.0–46.0)
Hemoglobin: 14.2 g/dL (ref 12.0–15.0)
MCH: 31.8 pg (ref 26.0–34.0)
MCHC: 34.1 g/dL (ref 30.0–36.0)
MCV: 93.3 fL (ref 78.0–100.0)
PLATELETS: 223 10*3/uL (ref 150–400)
RBC: 4.47 MIL/uL (ref 3.87–5.11)
RDW: 12.7 % (ref 11.5–15.5)
WBC: 6.1 10*3/uL (ref 4.0–10.5)

## 2016-05-22 LAB — I-STAT TROPONIN, ED: TROPONIN I, POC: 0 ng/mL (ref 0.00–0.08)

## 2016-05-22 MED ORDER — AZITHROMYCIN 250 MG PO TABS
500.0000 mg | ORAL_TABLET | Freq: Once | ORAL | Status: AC
  Administered 2016-05-22: 500 mg via ORAL
  Filled 2016-05-22: qty 2

## 2016-05-22 MED ORDER — ALBUTEROL SULFATE (2.5 MG/3ML) 0.083% IN NEBU: 2.5000 mg | INHALATION_SOLUTION | RESPIRATORY_TRACT | Status: DC | PRN

## 2016-05-22 MED ORDER — PREDNISONE 20 MG PO TABS
40.0000 mg | ORAL_TABLET | Freq: Every day | ORAL | Status: DC
  Administered 2016-05-23 – 2016-05-26 (×4): 40 mg via ORAL
  Filled 2016-05-22 (×4): qty 2

## 2016-05-22 MED ORDER — ENOXAPARIN SODIUM 40 MG/0.4ML ~~LOC~~ SOLN
40.0000 mg | Freq: Every day | SUBCUTANEOUS | Status: DC
  Filled 2016-05-22 (×4): qty 0.4

## 2016-05-22 MED ORDER — ALBUTEROL (5 MG/ML) CONTINUOUS INHALATION SOLN
10.0000 mg/h | INHALATION_SOLUTION | RESPIRATORY_TRACT | Status: DC
  Administered 2016-05-22: 10 mg/h via RESPIRATORY_TRACT

## 2016-05-22 MED ORDER — ALBUTEROL SULFATE (2.5 MG/3ML) 0.083% IN NEBU: 3.0000 mL | INHALATION_SOLUTION | Freq: Four times a day (QID) | RESPIRATORY_TRACT | Status: DC | PRN

## 2016-05-22 MED ORDER — IPRATROPIUM BROMIDE 0.02 % IN SOLN
0.5000 mg | Freq: Once | RESPIRATORY_TRACT | Status: AC
  Administered 2016-05-22: 0.5 mg via RESPIRATORY_TRACT
  Filled 2016-05-22: qty 2.5

## 2016-05-22 MED ORDER — IPRATROPIUM-ALBUTEROL 0.5-2.5 (3) MG/3ML IN SOLN: 3.0000 mL | Freq: Four times a day (QID) | RESPIRATORY_TRACT | Status: DC | PRN

## 2016-05-22 MED ORDER — ALBUTEROL SULFATE (2.5 MG/3ML) 0.083% IN NEBU
5.0000 mg | INHALATION_SOLUTION | Freq: Once | RESPIRATORY_TRACT | Status: AC
  Administered 2016-05-22: 5 mg via RESPIRATORY_TRACT
  Filled 2016-05-22: qty 6

## 2016-05-22 MED ORDER — ALBUTEROL (5 MG/ML) CONTINUOUS INHALATION SOLN
INHALATION_SOLUTION | RESPIRATORY_TRACT | Status: AC
  Filled 2016-05-22: qty 20

## 2016-05-22 MED ORDER — ALBUTEROL SULFATE (2.5 MG/3ML) 0.083% IN NEBU: 3.0000 mL | INHALATION_SOLUTION | RESPIRATORY_TRACT | Status: DC | PRN

## 2016-05-22 MED ORDER — UMECLIDINIUM BROMIDE 62.5 MCG/INH IN AEPB: 1.0000 | INHALATION_SPRAY | Freq: Every day | RESPIRATORY_TRACT | Status: DC

## 2016-05-22 MED ORDER — ARFORMOTEROL TARTRATE 15 MCG/2ML IN NEBU
15.0000 ug | INHALATION_SOLUTION | Freq: Two times a day (BID) | RESPIRATORY_TRACT | Status: DC
  Administered 2016-05-23 – 2016-05-26 (×5): 15 ug via RESPIRATORY_TRACT
  Filled 2016-05-22 (×7): qty 2

## 2016-05-22 MED ORDER — AZITHROMYCIN 250 MG PO TABS
250.0000 mg | ORAL_TABLET | Freq: Every day | ORAL | Status: AC
  Administered 2016-05-23 – 2016-05-26 (×4): 250 mg via ORAL
  Filled 2016-05-22 (×4): qty 1

## 2016-05-22 MED ORDER — DIPHENHYDRAMINE HCL 25 MG PO CAPS
25.0000 mg | ORAL_CAPSULE | Freq: Four times a day (QID) | ORAL | Status: DC | PRN
  Administered 2016-05-23: 25 mg via ORAL
  Filled 2016-05-22: qty 1

## 2016-05-22 MED ORDER — MOMETASONE FUROATE 220 MCG/INH IN AEPB: 2.0000 | INHALATION_SPRAY | Freq: Every day | RESPIRATORY_TRACT | Status: DC

## 2016-05-22 MED ORDER — METHYLPREDNISOLONE SODIUM SUCC 125 MG IJ SOLR
125.0000 mg | Freq: Once | INTRAMUSCULAR | Status: AC
  Administered 2016-05-22: 125 mg via INTRAVENOUS
  Filled 2016-05-22: qty 2

## 2016-05-22 MED ORDER — IPRATROPIUM-ALBUTEROL 0.5-2.5 (3) MG/3ML IN SOLN
3.0000 mL | RESPIRATORY_TRACT | Status: DC
  Administered 2016-05-23 – 2016-05-24 (×9): 3 mL via RESPIRATORY_TRACT
  Filled 2016-05-22 (×10): qty 3

## 2016-05-22 NOTE — ED Triage Notes (Signed)
Pt states she developed a cough and shob yesterday.  Per pt she is feeling shob continuously.  Wheezing in all lung fields.  Pt is coughing up thick yellow sputum.  Pt is dyspenic at rest as well as w/ exertion.  Pt is on O2 at home PRN.

## 2016-05-22 NOTE — ED Provider Notes (Signed)
[facility] DEPT Provider Note   CSN: [id number] Arrival date & time: 05/22/16  1933     History   Chief Complaint Chief Complaint  Patient presents with  . Shortness of Breath    Cough    HPI Patricia Johnston is a 66 y.o. female.  HPI Patient presents for shortness of breath and cough. Has had yellow sputum production. Has been worse since yesterday. Has a history of asthma and COPD. She is not a smoker. He is on home oxygen as needed. Has been worse last few days. No real chest pain. Has required more oxygen. Her baseline saturations are around 94%. No relief with her nebulizers at home.   Past Medical History:  Diagnosis Date  . Asthma   . COPD (chronic obstructive pulmonary disease) ([place])    pt states due to [place] contracted COPD  . Osteoporosis     Patient Active Problem List   Diagnosis Date Noted  . Bronchiectasis without complication ([place]) 40/12/2723  . Elevated IgE level 11/14/2015  . Eosinophilia 11/14/2015  . Chronic obstructive pulmonary disease ([place]) 04/19/2015  . Feeling of sadness 04/19/2015    Past Surgical History:  Procedure Laterality Date  . ABDOMINAL HYSTERECTOMY    . bunionectomy      OB History    No data available       Home Medications    Prior to Admission medications   Medication Sig Start Date End Date Taking? Authorizing Provider  albuterol (PROVENTIL HFA;VENTOLIN HFA) 108 (90 Base) MCG/ACT inhaler Inhale 1-2 puffs into the lungs every 6 (six) hours as needed for wheezing or shortness of breath.   Yes Historical Provider, MD  amoxicillin (AMOXIL) 500 MG tablet Take 500 mg by mouth once.   Yes Historical Provider, MD  diphenhydrAMINE (BENADRYL) 25 MG tablet Take 25 mg by mouth every 6 (six) hours as needed (anxiety).   Yes Historical Provider, MD  ipratropium-albuterol (DUONEB) 0.5-2.5 (3) MG/3ML SOLN Take 3 mLs by nebulization every 6 (six) hours as needed (sob and wheezing).    Yes Historical Provider, MD    mometasone [facility]) 220 MCG/INH inhaler Inhale 2 puffs into the lungs daily.   Yes Historical Provider, MD  PredniSONE 2 MG TBEC Take 2 tablets by mouth daily as needed (sob).   Yes Historical Provider, MD  Tiotropium Bromide-Olodaterol (STIOLTO RESPIMAT) 2.5-2.5 MCG/ACT AERS Inhale 2 puffs into the lungs daily.   Yes Historical Provider, MD  cyclobenzaprine (FLEXERIL) 5 MG tablet Take 1-2 po tid prn pain / muscle spasm Patient not taking: Reported on 05/22/2016 01/21/16   Lysbeth Penner, FNP  Dextromethorphan-Guaifenesin 5-100 MG/5ML LIQD Take 5 mLs by mouth every 4 (four) hours as needed (cough).     Historical Provider, MD  naproxen (NAPROSYN) 500 MG tablet Take 1 tablet (500 mg total) by mouth 2 (two) times daily with a meal. Patient not taking: Reported on 05/22/2016 01/21/16   Lysbeth Penner, FNP  OXYGEN Inhale 2 L into the lungs daily as needed (for exertion). Reported on 04/19/2015    Historical Provider, MD    Family History No family history on file.  Social History Social History  Substance Use Topics  . Smoking status: Never Smoker  . Smokeless tobacco: Never Used  . Alcohol use No     Allergies   Flonase [fluticasone propionate] and Nasacort [triamcinolone]   Review of Systems Review of Systems  Constitutional: Negative for appetite change and fatigue.  HENT: Negative for congestion.   Respiratory: Positive  for cough, shortness of breath and wheezing. Negative for chest tightness.   Cardiovascular: Negative for chest pain.  Gastrointestinal: Negative for abdominal pain.  Genitourinary: Negative for dyspareunia.  Musculoskeletal: Negative for back pain.  Neurological: Negative for weakness.  Psychiatric/Behavioral: Negative for confusion.     Physical Exam Updated Vital Signs BP (!) 154/90 (BP Location: Left Arm)   Pulse 97   Temp 98 F (36.7 C) (Oral)   Resp (!) 22   Ht 5\' 5"  (1.651 m)   Wt 128 lb (58.1 kg)   SpO2 94%   BMI 21.30 kg/m   Physical  Exam  Constitutional: She appears well-developed.  HENT:  Head: Atraumatic.  Eyes: EOM are normal.  Neck: Neck supple.  Cardiovascular: Normal rate.   Pulmonary/Chest:  Some diffuse wheezes and prolonged expirations. Harsh breath sounds. Mildly pursed lip breathing.  Abdominal: Soft.  Musculoskeletal: She exhibits no edema.  Neurological: She is alert.  Skin: Skin is warm. Capillary refill takes less than 2 seconds.     ED Treatments / Results  Labs (all labs ordered are listed, but only abnormal results are displayed) Labs Reviewed  BASIC METABOLIC PANEL - Abnormal; Notable for the following:       Result Value   Glucose, Bld 115 (*)    All other components within normal limits  CBC  I-STAT TROPOININ, ED    EKG  EKG Interpretation None       Radiology Dg Chest 2 View  Result Date: 05/22/2016 CLINICAL DATA:  66 year old female with cough and shortness of breath. History of asthma. EXAM: CHEST  2 VIEW COMPARISON:  Chest radiograph dated 02/27/2015 FINDINGS: There is emphysematous changes of the lungs with diffuse interstitial coarsening. There is hyperexpansion with flattening of the diaphragm and increased thoracic AP diameter. There is no focal consolidation, pleural effusion, or pneumothorax. The cardiac silhouette is within normal limits. No acute osseous pathology identified. IMPRESSION: 1. No acute cardiopulmonary process. 2. Emphysema. Electronically Signed   By: Anner Crete M.D.   On: 05/22/2016 20:48    Procedures Procedures (including critical care time)  Medications Ordered in ED Medications  albuterol (PROVENTIL,VENTOLIN) solution continuous neb (10 mg/hr Nebulization New Bag/Given 05/22/16 2245)  albuterol (PROVENTIL, VENTOLIN) (5 MG/ML) 0.5% continuous inhalation solution (  Not Given 05/22/16 2245)  methylPREDNISolone sodium succinate (SOLU-MEDROL) 125 mg/2 mL injection 125 mg (not administered)  albuterol (PROVENTIL) (2.5 MG/3ML) 0.083% nebulizer  solution 5 mg (5 mg Nebulization Given 05/22/16 2233)  ipratropium (ATROVENT) nebulizer solution 0.5 mg (0.5 mg Nebulization Given 05/22/16 2233)     Initial Impression / Assessment and Plan / ED Course  I have reviewed the triage vital signs and the nursing notes.  Pertinent labs & imaging results that were available during my care of the patient were reviewed by me and considered in my medical decision making (see chart for details).     Patient presents with shortness of breath and cough. His had it for the last few days. Sounds tight. Still wheezing and dyspneic after initial breathing treatment. Given hour-long treatment now. Has oxygen as needed at home. X-ray does not show pneumonia. Will admit to internal medicine for persistent dyspnea.  Final Clinical Impressions(s) / ED Diagnoses   Final diagnoses:  COPD exacerbation ([place])  Upper respiratory tract infection, unspecified type    New Prescriptions New Prescriptions   No medications on file     Davonna Belling, MD 05/22/16 2319

## 2016-05-22 NOTE — ED Notes (Signed)
Pt reports SOB since Wednesday with chest tightness. Pt has hx of asthma and COPD but had never been intubated.  Pt is A&Ox 4, hour long nebulizer in process.

## 2016-05-22 NOTE — H&P (Signed)
History and Physical    Patricia Johnston HCW:[id number] DOB: 10/09/1950 DOA: 05/22/2016  PCP: No PCP Per Patient  Patient coming from: Home  I have personally briefly reviewed patient's old medical records in [place]  Chief Complaint: SOB  HPI: Patricia Johnston is a 66 y.o. female with medical history significant of COPD with PRN home O2, ususally followed at [place].  Patient presents to the ED with c/o SOB, wheezing, cough.  Yellow sputum production.  Symptoms worsened yesterday but had been bad for the past couple of days.  No CP.  Has required more O2 than baseline.  Home nebs provided no relief.   ED Course: Now breathing is better after hour long neb and solumedrol although she has "jitters" from the hour long neb.   Review of Systems: As per HPI otherwise 10 point review of systems negative.   Past Medical History:  Diagnosis Date  . Asthma   . COPD (chronic obstructive pulmonary disease) ([place])    pt states due to [place] contracted COPD  . Osteoporosis     Past Surgical History:  Procedure Laterality Date  . ABDOMINAL HYSTERECTOMY    . bunionectomy       reports that she has never smoked. She has never used smokeless tobacco. She reports that she does not drink alcohol or use drugs.  Allergies  Allergen Reactions  . Flonase [Fluticasone Propionate] Other (See Comments)    Nasal bleeding  . Nasacort [Triamcinolone] Other (See Comments)    Nasal bleeding    No family history on file. No recent sick contacts.  Prior to Admission medications   Medication Sig Start Date End Date Taking? Authorizing Provider  albuterol (PROVENTIL HFA;VENTOLIN HFA) 108 (90 Base) MCG/ACT inhaler Inhale 1-2 puffs into the lungs every 6 (six) hours as needed for wheezing or shortness of breath.   Yes Historical Provider, MD  diphenhydrAMINE (BENADRYL) 25 MG tablet Take 25 mg by mouth every 6 (six) hours as needed (anxiety).   Yes Historical Provider, MD    ipratropium-albuterol (DUONEB) 0.5-2.5 (3) MG/3ML SOLN Take 3 mLs by nebulization every 6 (six) hours as needed (sob and wheezing).    Yes Historical Provider, MD  mometasone [facility]) 220 MCG/INH inhaler Inhale 2 puffs into the lungs daily.   Yes Historical Provider, MD  PredniSONE 2 MG TBEC Take 2 tablets by mouth daily as needed (sob).   Yes Historical Provider, MD  Tiotropium Bromide-Olodaterol (STIOLTO RESPIMAT) 2.5-2.5 MCG/ACT AERS Inhale 2 puffs into the lungs daily.   Yes Historical Provider, MD  Dextromethorphan-Guaifenesin 5-100 MG/5ML LIQD Take 5 mLs by mouth every 4 (four) hours as needed (cough).     Historical Provider, MD  OXYGEN Inhale 2 L into the lungs daily as needed (for exertion). Reported on 04/19/2015    Historical Provider, MD    Physical Exam: Vitals:   05/22/16 1941 05/22/16 2233  BP: (!) 154/90   Pulse: 97   Resp: (!) 22   Temp: 98 F (36.7 C)   TempSrc: Oral   SpO2: (!) 89% 94%  Weight: 58.1 kg (128 lb)   Height: 5\' 5"  (1.651 m)     Constitutional: NAD, calm, comfortable Eyes: PERRL, lids and conjunctivae normal ENMT: Mucous membranes are moist. Posterior pharynx clear of any exudate or lesions.Normal dentition.  Neck: normal, supple, no masses, no thyromegaly Respiratory: Diffuse wheezing. Cardiovascular: Regular rate and rhythm, no murmurs / rubs / gallops. No extremity edema. 2+ pedal pulses. No carotid bruits.  Abdomen: no  tenderness, no masses palpated. No hepatosplenomegaly. Bowel sounds positive.  Musculoskeletal: no clubbing / cyanosis. No joint deformity upper and lower extremities. Good ROM, no contractures. Normal muscle tone.  Skin: no rashes, lesions, ulcers. No induration Neurologic: CN 2-12 grossly intact. Sensation intact, DTR normal. Strength 5/5 in all 4.  Psychiatric: Normal judgment and insight. Alert and oriented x 3. Normal mood.    Labs on Admission: I have personally reviewed following labs and imaging studies  CBC:  Recent  Labs Lab 05/22/16 2136  WBC 6.1  HGB 14.2  HCT 41.7  MCV 93.3  PLT 017   Basic Metabolic Panel:  Recent Labs Lab 05/22/16 2136  NA 143  K 4.3  CL 108  CO2 29  GLUCOSE 115*  BUN 10  CREATININE 0.82  CALCIUM 9.5   GFR: Estimated Creatinine Clearance: 60.7 mL/min (by C-G formula based on SCr of 0.82 mg/dL). Liver Function Tests: No results for input(s): AST, ALT, ALKPHOS, BILITOT, PROT, ALBUMIN in the last 168 hours. No results for input(s): LIPASE, AMYLASE in the last 168 hours. No results for input(s): AMMONIA in the last 168 hours. Coagulation Profile: No results for input(s): INR, PROTIME in the last 168 hours. Cardiac Enzymes: No results for input(s): CKTOTAL, CKMB, CKMBINDEX, TROPONINI in the last 168 hours. BNP (last 3 results) No results for input(s): PROBNP in the last 8760 hours. HbA1C: No results for input(s): HGBA1C in the last 72 hours. CBG: No results for input(s): GLUCAP in the last 168 hours. Lipid Profile: No results for input(s): CHOL, HDL, LDLCALC, TRIG, CHOLHDL, LDLDIRECT in the last 72 hours. Thyroid Function Tests: No results for input(s): TSH, T4TOTAL, FREET4, T3FREE, THYROIDAB in the last 72 hours. Anemia Panel: No results for input(s): VITAMINB12, FOLATE, FERRITIN, TIBC, IRON, RETICCTPCT in the last 72 hours. Urine analysis: No results found for: COLORURINE, APPEARANCEUR, LABSPEC, PHURINE, GLUCOSEU, HGBUR, BILIRUBINUR, KETONESUR, PROTEINUR, UROBILINOGEN, NITRITE, LEUKOCYTESUR  Radiological Exams on Admission: Dg Chest 2 View  Result Date: 05/22/2016 CLINICAL DATA:  66 year old female with cough and shortness of breath. History of asthma. EXAM: CHEST  2 VIEW COMPARISON:  Chest radiograph dated 02/27/2015 FINDINGS: There is emphysematous changes of the lungs with diffuse interstitial coarsening. There is hyperexpansion with flattening of the diaphragm and increased thoracic AP diameter. There is no focal consolidation, pleural effusion, or  pneumothorax. The cardiac silhouette is within normal limits. No acute osseous pathology identified. IMPRESSION: 1. No acute cardiopulmonary process. 2. Emphysema. Electronically Signed   By: Anner Crete M.D.   On: 05/22/2016 20:48    EKG: Independently reviewed.  Assessment/Plan Principal Problem:   COPD with acute exacerbation (HCC) Active Problems:   Acute and chronic respiratory failure with hypoxia ([place])    1. COPD exacerbation - causing acute on chronic respiratory failure with hypoxia 1. Continuous pulse ox 2. O2 as needed to maintain sats 3. Adult wheeze protocol and albuterol neb treatments 4. Continue home nebs 5. Prednisone daily 6. Azithromycin  DVT prophylaxis: Lovenox Code Status: Full Family Communication: No family in room Disposition Plan: Home after admit Consults called: none Admission status: Admit to inpatient, inpatient status due to new O2 requirement.   Etta Quill DO [facility] Pager [phone]  If 7AM-7PM, please contact day team taking care of patient [email or web address] Password [facility]  05/22/2016, 11:41 PM

## 2016-05-23 DIAGNOSIS — J441 Chronic obstructive pulmonary disease with (acute) exacerbation: Secondary | ICD-10-CM

## 2016-05-23 LAB — RESPIRATORY PANEL BY PCR
ADENOVIRUS-RVPPCR: NOT DETECTED
BORDETELLA PERTUSSIS-RVPCR: NOT DETECTED
CHLAMYDOPHILA PNEUMONIAE-RVPPCR: NOT DETECTED
CORONAVIRUS NL63-RVPPCR: NOT DETECTED
Coronavirus 229E: NOT DETECTED
Coronavirus HKU1: NOT DETECTED
Coronavirus OC43: NOT DETECTED
INFLUENZA A-RVPPCR: NOT DETECTED
INFLUENZA B-RVPPCR: NOT DETECTED
MYCOPLASMA PNEUMONIAE-RVPPCR: NOT DETECTED
Metapneumovirus: NOT DETECTED
PARAINFLUENZA VIRUS 4-RVPPCR: NOT DETECTED
Parainfluenza Virus 1: NOT DETECTED
Parainfluenza Virus 2: NOT DETECTED
Parainfluenza Virus 3: NOT DETECTED
Respiratory Syncytial Virus: NOT DETECTED
Rhinovirus / Enterovirus: DETECTED — AB

## 2016-05-23 LAB — INFLUENZA PANEL BY PCR (TYPE A & B)
Influenza A By PCR: NEGATIVE
Influenza B By PCR: NEGATIVE

## 2016-05-23 LAB — STREP PNEUMONIAE URINARY ANTIGEN: Strep Pneumo Urinary Antigen: NEGATIVE

## 2016-05-23 LAB — EXPECTORATED SPUTUM ASSESSMENT W REFEX TO RESP CULTURE

## 2016-05-23 LAB — EXPECTORATED SPUTUM ASSESSMENT W GRAM STAIN, RFLX TO RESP C

## 2016-05-23 LAB — MRSA PCR SCREENING: MRSA BY PCR: NEGATIVE

## 2016-05-23 MED ORDER — BUDESONIDE 0.5 MG/2ML IN SUSP
0.5000 mg | Freq: Two times a day (BID) | RESPIRATORY_TRACT | Status: DC
Start: 2016-05-24 — End: 2016-05-26
  Filled 2016-05-23 (×5): qty 2

## 2016-05-23 MED ORDER — ENSURE ENLIVE PO LIQD
237.0000 mL | Freq: Two times a day (BID) | ORAL | Status: DC
  Administered 2016-05-23 – 2016-05-26 (×5): 237 mL via ORAL

## 2016-05-23 MED ORDER — KETOROLAC TROMETHAMINE 15 MG/ML IJ SOLN
15.0000 mg | Freq: Once | INTRAMUSCULAR | Status: AC
  Administered 2016-05-24: 15 mg via INTRAVENOUS
  Filled 2016-05-23: qty 1

## 2016-05-23 MED ORDER — GUAIFENESIN ER 600 MG PO TB12
600.0000 mg | ORAL_TABLET | Freq: Two times a day (BID) | ORAL | Status: DC
  Administered 2016-05-23 – 2016-05-26 (×7): 600 mg via ORAL
  Filled 2016-05-23 (×7): qty 1

## 2016-05-23 MED ORDER — ACETAMINOPHEN 325 MG PO TABS
650.0000 mg | ORAL_TABLET | Freq: Four times a day (QID) | ORAL | Status: DC | PRN
  Administered 2016-05-23 – 2016-05-25 (×4): 650 mg via ORAL
  Filled 2016-05-23 (×4): qty 2

## 2016-05-23 MED ORDER — IBUPROFEN 200 MG PO TABS
400.0000 mg | ORAL_TABLET | Freq: Once | ORAL | Status: AC
  Administered 2016-05-23: 400 mg via ORAL
  Filled 2016-05-23: qty 2

## 2016-05-23 NOTE — Progress Notes (Signed)
PROGRESS NOTE  Mackynzie Woolford [id number]:[id number] DOB: May 05, 1950 DOA: 05/22/2016 PCP: No PCP Per Patient  HPI/Recap of past 24 hours:  Feeling better, less wheezing, less cough, on 3liters, no fever, denies chest pain A female "nurse Friend" in room  Assessment/Plan: Principal Problem:   COPD with acute exacerbation ([place]) Active Problems:   Acute and chronic respiratory failure with hypoxia ([place])   Acute on chronic hypoxic respiratory failure due to bronchitis/asthma/acute COPD exacerbation ( baseline active, only on o2 2liter prn for exertion, not steroid dependent)  Patient has a significant pulmonary history, she is a never smoker, but started to have respiratory symptom after enviromental exposure in the arm many years ago. She has undergone multiple testing, she has elevated IgE per chart review She presented with significant respiratory distress with hypoxic 84% on three liter o2, sinus tachycardia in the 120's, tachypnea with RR 25 cxr + for emphysema but no infiltrates, she does not have fever, no leukocytosis, flu /mrsa scree negative, urine strep pneumo antigen negative, respiratory viral penal and sputum culture pending She received continuous nebs in the ED , now on scheduled duoneb, steroids, zithromax  Improving     Code Status: full  Family Communication: patient and "nurse friend" at bedside  Disposition Plan: home in 1-2 days   Consultants:  none  Procedures:  none  Antibiotics:  zithro   Objective: BP 140/76 (BP Location: Right Arm)   Pulse 88   Temp 98.4 F (36.9 C) (Oral)   Resp 20   Ht 5\' 5"  (1.651 m)   Wt 58.1 kg (128 lb)   SpO2 93%   BMI 21.30 kg/m   Intake/Output Summary (Last 24 hours) at 05/23/16 1739 Last data filed at 05/23/16 1345  Gross per 24 hour  Intake              480 ml  Output                0 ml  Net              480 ml   Filed Weights   05/22/16 1941  Weight: 58.1 kg (128 lb)    Exam:   General:   NAD  Cardiovascular: RRR  Respiratory: bilateral end expiratory wheezing  Abdomen: Soft/ND/NT, positive BS  Musculoskeletal: No Edema  Neuro: aaox3  Data Reviewed: Basic Metabolic Panel:  Recent Labs Lab 05/22/16 2136  NA 143  K 4.3  CL 108  CO2 29  GLUCOSE 115*  BUN 10  CREATININE 0.82  CALCIUM 9.5   Liver Function Tests: No results for input(s): AST, ALT, ALKPHOS, BILITOT, PROT, ALBUMIN in the last 168 hours. No results for input(s): LIPASE, AMYLASE in the last 168 hours. No results for input(s): AMMONIA in the last 168 hours. CBC:  Recent Labs Lab 05/22/16 2136  WBC 6.1  HGB 14.2  HCT 41.7  MCV 93.3  PLT 223   Cardiac Enzymes:   No results for input(s): CKTOTAL, CKMB, CKMBINDEX, TROPONINI in the last 168 hours. BNP (last 3 results) No results for input(s): BNP in the last 8760 hours.  ProBNP (last 3 results) No results for input(s): PROBNP in the last 8760 hours.  CBG: No results for input(s): GLUCAP in the last 168 hours.  Recent Results (from the past 240 hour(s))  Culture, expectorated sputum-assessment     Status: None   Collection Time: 05/23/16 11:27 AM  Result Value Ref Range Status   Specimen Description SPUTUM  Final  Special Requests NONE  Final   Sputum evaluation THIS SPECIMEN IS ACCEPTABLE FOR SPUTUM CULTURE  Final   Report Status 05/23/2016 FINAL  Final  Culture, respiratory (NON-Expectorated)     Status: None (Preliminary result)   Collection Time: 05/23/16 11:27 AM  Result Value Ref Range Status   Specimen Description SPUTUM  Final   Special Requests NONE Reflexed from [id number]  Final   Gram Stain   Final    ABUNDANT WBC PRESENT,BOTH PMN AND MONONUCLEAR RARE SQUAMOUS EPITHELIAL CELLS PRESENT RARE GRAM POSITIVE COCCI IN PAIRS IN CHAINS RARE GRAM NEGATIVE RODS RARE GRAM POSITIVE RODS Performed at [facility], [place] [address] [place], [place] [address]    Culture PENDING  Incomplete   Report Status PENDING  Incomplete   MRSA PCR Screening     Status: None   Collection Time: 05/23/16 11:28 AM  Result Value Ref Range Status   MRSA by PCR NEGATIVE NEGATIVE Final    Comment:        The GeneXpert MRSA Assay (FDA approved for NASAL specimens only), is one component of a comprehensive MRSA colonization surveillance program. It is not intended to diagnose MRSA infection nor to guide or monitor treatment for MRSA infections.      Studies: Dg Chest 2 View  Result Date: 05/22/2016 CLINICAL DATA:  66 year old female with cough and shortness of breath. History of asthma. EXAM: CHEST  2 VIEW COMPARISON:  Chest radiograph dated 02/27/2015 FINDINGS: There is emphysematous changes of the lungs with diffuse interstitial coarsening. There is hyperexpansion with flattening of the diaphragm and increased thoracic AP diameter. There is no focal consolidation, pleural effusion, or pneumothorax. The cardiac silhouette is within normal limits. No acute osseous pathology identified. IMPRESSION: 1. No acute cardiopulmonary process. 2. Emphysema. Electronically Signed   By: Anner Crete M.D.   On: 05/22/2016 20:48    Scheduled Meds: . arformoterol  15 mcg Nebulization BID  . azithromycin  250 mg Oral Daily  . [START ON 05/24/2016] budesonide (PULMICORT) nebulizer solution  0.5 mg Nebulization BID  . enoxaparin (LOVENOX) injection  40 mg Subcutaneous Daily  . feeding supplement (ENSURE ENLIVE)  237 mL Oral BID BM  . guaiFENesin  600 mg Oral BID  . ipratropium-albuterol  3 mL Nebulization Q4H  . predniSONE  40 mg Oral Q breakfast    Continuous Infusions:   Time spent: 51mins  Yoana Staib MD, PhD  [facility] Pager [phone]. If 7PM-7AM, please contact night-coverage at [email or web address], password [facility] 05/23/2016, 5:39 PM  LOS: 1 day

## 2016-05-23 NOTE — Progress Notes (Signed)
Pt arrived to unit room 1507 via stretcher. alert and oriented x 4 unsteady gait, general weakness. VS taken and pt oriented to room and callbell with no complications. Pt guide at the bedside.  Initial assessment completed, will continue to monitor and intervene appropriately.

## 2016-05-24 LAB — COMPREHENSIVE METABOLIC PANEL
ALBUMIN: 3.8 g/dL (ref 3.5–5.0)
ALT: 20 U/L (ref 14–54)
ANION GAP: 8 (ref 5–15)
AST: 25 U/L (ref 15–41)
Alkaline Phosphatase: 70 U/L (ref 38–126)
BUN: 17 mg/dL (ref 6–20)
CHLORIDE: 105 mmol/L (ref 101–111)
CO2: 29 mmol/L (ref 22–32)
Calcium: 9 mg/dL (ref 8.9–10.3)
Creatinine, Ser: 0.74 mg/dL (ref 0.44–1.00)
GFR calc non Af Amer: >60 mL/min (ref >60)
GLUCOSE: 115 mg/dL — AB (ref 65–99)
Potassium: 3.4 mmol/L — ABNORMAL LOW (ref 3.5–5.1)
SODIUM: 142 mmol/L (ref 135–145)
Total Bilirubin: 0.7 mg/dL (ref 0.3–1.2)
Total Protein: 6.7 g/dL (ref 6.5–8.1)

## 2016-05-24 LAB — TSH: TSH: 0.7 u[IU]/mL (ref 0.350–4.500)

## 2016-05-24 LAB — HIV ANTIBODY (ROUTINE TESTING W REFLEX): HIV SCREEN 4TH GENERATION: NONREACTIVE

## 2016-05-24 LAB — MAGNESIUM: Magnesium: 2.2 mg/dL (ref 1.7–2.4)

## 2016-05-24 MED ORDER — IPRATROPIUM-ALBUTEROL 0.5-2.5 (3) MG/3ML IN SOLN
3.0000 mL | Freq: Four times a day (QID) | RESPIRATORY_TRACT | Status: DC
  Administered 2016-05-25 (×3): 3 mL via RESPIRATORY_TRACT
  Filled 2016-05-24 (×4): qty 3

## 2016-05-24 NOTE — Care Management Note (Addendum)
Case Management Note  Patient Details  Name: Patricia Johnston MRN: [id number] Date of Birth: [birth year]  Subjective/Objective:      COPD              Action/Plan: Discharge Planning: NCM spoke to pt and states she received her oxygen through the [place]. She goes to [place]. States she has the large portable tanks which are cumbersome when she goes out. She is requesting light weight portable tank to use when she goes out. Also a pulse oximeter to check her oxygen levels at home. Her neb machine is over 23-14 years old. Requesting new neb machine. Spoke to attended, orders updated. Faxed orders to [facility] # [phone] and will have weekday [facility] Pt states she sees Dr Gwenette Greet at the [place].    PCP [place]   Expected Discharge Date:  05/25/16               Expected Discharge Plan:  Home/Self Care  In-House Referral:  NA  Discharge planning Services  CM Consult  Post Acute Care Choice:    Choice offered to:     DME Arranged:    DME Agency:     HH Arranged:    HH Agency:     Status of Service:  In process, will continue to follow  If discussed at Long Length of Stay Meetings, dates discussed:    Additional Comments:  Erenest Rasher, RN 05/24/2016, 4:24 PM

## 2016-05-24 NOTE — Progress Notes (Signed)
PROGRESS NOTE  Patricia Johnston IWL:[id number] DOB: [birth year] DOA: 05/22/2016 PCP: No PCP Per Patient  HPI/Recap of past 24 hours:  Feeling better, less wheezing, less cough, on 3liters, no fever, denies chest pain She is talking to her female "nurse Friend" over the phone  Assessment/Plan: Principal Problem:   COPD with acute exacerbation ([place]) Active Problems:   Acute and chronic respiratory failure with hypoxia ([place])   Acute on chronic hypoxic respiratory failure due to bronchitis/asthma/acute COPD exacerbation ( baseline active, only on o2 2liter prn for exertion, not steroid dependent)  Patient has a significant pulmonary history, she is a never smoker, but started to have respiratory symptom after enviromental exposure in the arm many years ago. She has undergone multiple testing, she has elevated IgE per chart review She presented with significant respiratory distress with hypoxic 84% on three liter o2, sinus tachycardia in the 120's, tachypnea with RR 25 cxr + for emphysema but no infiltrates, she does not have fever, no leukocytosis, flu /mrsa scree negative, urine strep pneumo antigen negative, respiratory viral penal and sputum culture pending She received continuous nebs in the ED , now on scheduled duoneb, steroids, zithromax  Improving     Code Status: full  Family Communication: patient and "nurse friend" at bedside  Disposition Plan: home in 1-2 days   Consultants:  none  Procedures:  none  Antibiotics:  zithro   Objective: BP (!) 144/76 (BP Location: Left Arm)   Pulse 92   Temp 98.3 F (36.8 C) (Oral)   Resp 18   Ht 5\' 5"  (1.651 m)   Wt 58.1 kg (128 lb)   SpO2 95%   BMI 21.30 kg/m   Intake/Output Summary (Last 24 hours) at 05/24/16 0945 Last data filed at 05/24/16 0801  Gross per 24 hour  Intake              480 ml  Output              900 ml  Net             -420 ml   Filed Weights   05/22/16 1941  Weight: 58.1 kg (128 lb)     Exam:   General:  NAD  Cardiovascular: RRR  Respiratory: bilateral end expiratory wheezing  Abdomen: Soft/ND/NT, positive BS  Musculoskeletal: No Edema  Neuro: aaox3  Data Reviewed: Basic Metabolic Panel:  Recent Labs Lab 05/22/16 2136 05/24/16 0524  NA 143 142  K 4.3 3.4*  CL 108 105  CO2 29 29  GLUCOSE 115* 115*  BUN 10 17  CREATININE 0.82 0.74  CALCIUM 9.5 9.0  MG  --  2.2   Liver Function Tests:  Recent Labs Lab 05/24/16 0524  AST 25  ALT 20  ALKPHOS 70  BILITOT 0.7  PROT 6.7  ALBUMIN 3.8   No results for input(s): LIPASE, AMYLASE in the last 168 hours. No results for input(s): AMMONIA in the last 168 hours. CBC:  Recent Labs Lab 05/22/16 2136  WBC 6.1  HGB 14.2  HCT 41.7  MCV 93.3  PLT 223   Cardiac Enzymes:   No results for input(s): CKTOTAL, CKMB, CKMBINDEX, TROPONINI in the last 168 hours. BNP (last 3 results) No results for input(s): BNP in the last 8760 hours.  ProBNP (last 3 results) No results for input(s): PROBNP in the last 8760 hours.  CBG: No results for input(s): GLUCAP in the last 168 hours.  Recent Results (from the past 240 hour(s))  Culture,  expectorated sputum-assessment     Status: None   Collection Time: 05/23/16 11:27 AM  Result Value Ref Range Status   Specimen Description SPUTUM  Final   Special Requests NONE  Final   Sputum evaluation THIS SPECIMEN IS ACCEPTABLE FOR SPUTUM CULTURE  Final   Report Status 05/23/2016 FINAL  Final  Culture, respiratory (NON-Expectorated)     Status: None (Preliminary result)   Collection Time: 05/23/16 11:27 AM  Result Value Ref Range Status   Specimen Description SPUTUM  Final   Special Requests NONE Reflexed from [id number]  Final   Gram Stain   Final    ABUNDANT WBC PRESENT,BOTH PMN AND MONONUCLEAR RARE SQUAMOUS EPITHELIAL CELLS PRESENT RARE GRAM POSITIVE COCCI IN PAIRS IN CHAINS RARE GRAM NEGATIVE RODS RARE GRAM POSITIVE RODS    Culture   Final    CULTURE  REINCUBATED FOR BETTER GROWTH Performed at [facility], [place] [address], [place], [place] [address]    Report Status PENDING  Incomplete  Respiratory Panel by PCR     Status: Abnormal   Collection Time: 05/23/16 11:28 AM  Result Value Ref Range Status   Adenovirus NOT DETECTED NOT DETECTED Final   Coronavirus 229E NOT DETECTED NOT DETECTED Final   Coronavirus HKU1 NOT DETECTED NOT DETECTED Final   Coronavirus NL63 NOT DETECTED NOT DETECTED Final   Coronavirus OC43 NOT DETECTED NOT DETECTED Final   Metapneumovirus NOT DETECTED NOT DETECTED Final   Rhinovirus / Enterovirus DETECTED (A) NOT DETECTED Final   Influenza A NOT DETECTED NOT DETECTED Final   Influenza B NOT DETECTED NOT DETECTED Final   Parainfluenza Virus 1 NOT DETECTED NOT DETECTED Final   Parainfluenza Virus 2 NOT DETECTED NOT DETECTED Final   Parainfluenza Virus 3 NOT DETECTED NOT DETECTED Final   Parainfluenza Virus 4 NOT DETECTED NOT DETECTED Final   Respiratory Syncytial Virus NOT DETECTED NOT DETECTED Final   Bordetella pertussis NOT DETECTED NOT DETECTED Final   Chlamydophila pneumoniae NOT DETECTED NOT DETECTED Final   Mycoplasma pneumoniae NOT DETECTED NOT DETECTED Final    Comment: Performed at [facility], [address] [place], [place] [address]  MRSA PCR Screening     Status: None   Collection Time: 05/23/16 11:28 AM  Result Value Ref Range Status   MRSA by PCR NEGATIVE NEGATIVE Final    Comment:        The GeneXpert MRSA Assay (FDA approved for NASAL specimens only), is one component of a comprehensive MRSA colonization surveillance program. It is not intended to diagnose MRSA infection nor to guide or monitor treatment for MRSA infections.      Studies: No results found.  Scheduled Meds: . arformoterol  15 mcg Nebulization BID  . azithromycin  250 mg Oral Daily  . budesonide (PULMICORT) nebulizer solution  0.5 mg Nebulization BID  . enoxaparin (LOVENOX) injection  40 mg  Subcutaneous Daily  . feeding supplement (ENSURE ENLIVE)  237 mL Oral BID BM  . guaiFENesin  600 mg Oral BID  . ipratropium-albuterol  3 mL Nebulization Q4H  . predniSONE  40 mg Oral Q breakfast    Continuous Infusions:   Time spent: 17mins  Damar Petit MD, PhD  [facility] Pager [phone]. If 7PM-7AM, please contact night-coverage at [email or web address], password [facility] 05/24/2016, 9:45 AM  LOS: 2 days

## 2016-05-24 NOTE — Progress Notes (Signed)
Patient ambulated in hall 200 foot on room air.  Lowest O2 87%  P 101.  Returned to patient room.  Room air sat 93% P 86 then 95% room air and 84 Pulse when sitting.

## 2016-05-24 NOTE — Progress Notes (Signed)
Nutrition Brief Note  RD consulted via COPD gold protocol.  Weight is stable per chart review. Pt is eating 70-100% of meals at this time. Has been ordered Ensure as she drinks this at home.  Wt Readings from Last 15 Encounters:  05/22/16 128 lb (58.1 kg)  02/13/16 128 lb 12 oz (58.4 kg)  02/11/16 128 lb 12 oz (58.4 kg)  02/06/16 127 lb 13.9 oz (58 kg)  02/04/16 127 lb 3.3 oz (57.7 kg)  01/30/16 125 lb 7.1 oz (56.9 kg)  01/28/16 125 lb 10.6 oz (57 kg)  01/14/16 126 lb 5.2 oz (57.3 kg)  01/09/16 126 lb 8.7 oz (57.4 kg)  01/07/16 124 lb 12.5 oz (56.6 kg)  01/02/16 125 lb 14.1 oz (57.1 kg)  12/31/15 125 lb 10.6 oz (57 kg)  12/26/15 123 lb 14.4 oz (56.2 kg)  12/24/15 126 lb 8.7 oz (57.4 kg)  12/19/15 125 lb 14.1 oz (57.1 kg)    Body mass index is 21.3 kg/m. Patient meets criteria for normal based on current BMI.   Current diet order is Regular, patient is consuming approximately 70-100% of meals at this time. Labs and medications reviewed.   No nutrition interventions warranted at this time. If nutrition issues arise, please consult RD.   Clayton Bibles, MS, RD, LDN Pager: [phone] After Hours Pager: [phone]

## 2016-05-25 ENCOUNTER — Encounter (HOSPITAL_COMMUNITY): Payer: Self-pay | Admitting: Radiology

## 2016-05-25 ENCOUNTER — Inpatient Hospital Stay (HOSPITAL_COMMUNITY): Payer: Medicare Other

## 2016-05-25 LAB — BASIC METABOLIC PANEL
Anion gap: 6 (ref 5–15)
BUN: 14 mg/dL (ref 6–20)
CALCIUM: 8.9 mg/dL (ref 8.9–10.3)
CO2: 33 mmol/L — ABNORMAL HIGH (ref 22–32)
CREATININE: 0.76 mg/dL (ref 0.44–1.00)
Chloride: 103 mmol/L (ref 101–111)
GFR calc non Af Amer: >60 mL/min (ref >60)
GLUCOSE: 89 mg/dL (ref 65–99)
Potassium: 3.6 mmol/L (ref 3.5–5.1)
Sodium: 142 mmol/L (ref 135–145)

## 2016-05-25 LAB — LEGIONELLA PNEUMOPHILA SEROGP 1 UR AG: L. pneumophila Serogp 1 Ur Ag: NEGATIVE

## 2016-05-25 MED ORDER — SACCHAROMYCES BOULARDII 250 MG PO CAPS
250.0000 mg | ORAL_CAPSULE | Freq: Two times a day (BID) | ORAL | Status: DC
  Administered 2016-05-25 – 2016-05-26 (×3): 250 mg via ORAL
  Filled 2016-05-25 (×3): qty 1

## 2016-05-25 MED ORDER — DEXTROSE 5 % IV SOLN
1.0000 g | INTRAVENOUS | Status: DC
  Administered 2016-05-25 – 2016-05-26 (×2): 1 g via INTRAVENOUS
  Filled 2016-05-25 (×2): qty 10

## 2016-05-25 MED ORDER — IPRATROPIUM-ALBUTEROL 0.5-2.5 (3) MG/3ML IN SOLN
3.0000 mL | Freq: Three times a day (TID) | RESPIRATORY_TRACT | Status: DC
  Administered 2016-05-26: 3 mL via RESPIRATORY_TRACT
  Filled 2016-05-25: qty 3

## 2016-05-25 NOTE — Progress Notes (Signed)
[id number]/MEQAST Asiah Browder,BSN,RN3,[phone]/will follow for home equipment need of a home oximeter and portable o2 concentrator.

## 2016-05-25 NOTE — Progress Notes (Signed)
Date:  May 25, 2016 Chart reviewed for concurrent status and case management needs. Will continue to follow patient progress. Discharge Planning: following for needs Expected discharge date: [id number] Rhonda Davis, BSN, RN3, CCM   [phone] 

## 2016-05-25 NOTE — Progress Notes (Signed)
PROGRESS NOTE  Patricia Johnston VQQ:[id number] DOB: 08/24/1950 DOA: 05/22/2016 PCP: [facility]  HPI/Recap of past 24 hours:  Continued productive cough with thick yellow sputum, less wheezing,  on 3liters, no fever, denies chest pain   Assessment/Plan: Principal Problem:   COPD with acute exacerbation ([place]) Active Problems:   Acute and chronic respiratory failure with hypoxia ([facility])   Acute on chronic hypoxic respiratory failure due to bronchitis/asthma/acute COPD exacerbation ( baseline active, only on o2 2liter prn for exertion, not steroid dependent)  Patient has a significant pulmonary history, she is a never smoker, but started to have respiratory symptom after enviromental exposure in the arm many years ago. She has undergone multiple testing, she has elevated IgE per chart review She presented with significant respiratory distress with hypoxic 84% on three liter o2, sinus tachycardia in the 120's, tachypnea with RR 25 cxr + for emphysema but no infiltrates, she does not have fever, no leukocytosis, flu /mrsa scree negative, urine strep pneumo antigen negative, respiratory viral penal and sputum culture pending She received continuous nebs in the ED , now on scheduled duoneb, steroids, zithromax ,  Continued to have productive cough, not back to baseline, add rocephin, get chest CT  Anxiety: she does not want to be on high dose steroids, report concerned about bone side effect.     Code Status: full  Family Communication: patient   Disposition Plan: home in 1-2 days   Consultants:  none  Procedures:  none  Antibiotics:  zithro from admission  Rocephin from 3/26--   Objective: BP 126/90 (BP Location: Left Arm)   Pulse 70   Temp 98.3 F (36.8 C) (Oral)   Resp 19   Ht 5\' 5"  (1.651 m)   Wt 58.1 kg (128 lb)   SpO2 (!) 89% Comment: Pt has a cont. pulse ox  BMI 21.30 kg/m   Intake/Output Summary (Last 24 hours) at 05/25/16 0900 Last data filed  at 05/25/16 4332  Gross per 24 hour  Intake              540 ml  Output             1400 ml  Net             -860 ml   Filed Weights   05/22/16 1941  Weight: 58.1 kg (128 lb)    Exam:   General:  Productive cough  Cardiovascular: RRR  Respiratory: persistent bilateral end expiratory wheezing  Abdomen: Soft/ND/NT, positive BS  Musculoskeletal: No Edema  Neuro: aaox3  Data Reviewed: Basic Metabolic Panel:  Recent Labs Lab 05/22/16 2136 05/24/16 0524 05/25/16 0549  NA 143 142 142  K 4.3 3.4* 3.6  CL 108 105 103  CO2 29 29 33*  GLUCOSE 115* 115* 89  BUN 10 17 14   CREATININE 0.82 0.74 0.76  CALCIUM 9.5 9.0 8.9  MG  --  2.2  --    Liver Function Tests:  Recent Labs Lab 05/24/16 0524  AST 25  ALT 20  ALKPHOS 70  BILITOT 0.7  PROT 6.7  ALBUMIN 3.8   No results for input(s): LIPASE, AMYLASE in the last 168 hours. No results for input(s): AMMONIA in the last 168 hours. CBC:  Recent Labs Lab 05/22/16 2136  WBC 6.1  HGB 14.2  HCT 41.7  MCV 93.3  PLT 223   Cardiac Enzymes:   No results for input(s): CKTOTAL, CKMB, CKMBINDEX, TROPONINI in the last 168 hours. BNP (last 3 results)  No results for input(s): BNP in the last 8760 hours.  ProBNP (last 3 results) No results for input(s): PROBNP in the last 8760 hours.  CBG: No results for input(s): GLUCAP in the last 168 hours.  Recent Results (from the past 240 hour(s))  Culture, expectorated sputum-assessment     Status: None   Collection Time: 05/23/16 11:27 AM  Result Value Ref Range Status   Specimen Description SPUTUM  Final   Special Requests NONE  Final   Sputum evaluation THIS SPECIMEN IS ACCEPTABLE FOR SPUTUM CULTURE  Final   Report Status 05/23/2016 FINAL  Final  Culture, respiratory (NON-Expectorated)     Status: None (Preliminary result)   Collection Time: 05/23/16 11:27 AM  Result Value Ref Range Status   Specimen Description SPUTUM  Final   Special Requests NONE Reflexed from [id number]   Final   Gram Stain   Final    ABUNDANT WBC PRESENT,BOTH PMN AND MONONUCLEAR RARE SQUAMOUS EPITHELIAL CELLS PRESENT RARE GRAM POSITIVE COCCI IN PAIRS IN CHAINS RARE GRAM NEGATIVE RODS RARE GRAM POSITIVE RODS    Culture   Final    CULTURE REINCUBATED FOR BETTER GROWTH Performed at [facility], [place] [address] [place], [place] [address]    Report Status PENDING  Incomplete  Respiratory Panel by PCR     Status: Abnormal   Collection Time: 05/23/16 11:28 AM  Result Value Ref Range Status   Adenovirus NOT DETECTED NOT DETECTED Final   Coronavirus 229E NOT DETECTED NOT DETECTED Final   Coronavirus HKU1 NOT DETECTED NOT DETECTED Final   Coronavirus NL63 NOT DETECTED NOT DETECTED Final   Coronavirus OC43 NOT DETECTED NOT DETECTED Final   Metapneumovirus NOT DETECTED NOT DETECTED Final   Rhinovirus / Enterovirus DETECTED (A) NOT DETECTED Final   Influenza A NOT DETECTED NOT DETECTED Final   Influenza B NOT DETECTED NOT DETECTED Final   Parainfluenza Virus 1 NOT DETECTED NOT DETECTED Final   Parainfluenza Virus 2 NOT DETECTED NOT DETECTED Final   Parainfluenza Virus 3 NOT DETECTED NOT DETECTED Final   Parainfluenza Virus 4 NOT DETECTED NOT DETECTED Final   Respiratory Syncytial Virus NOT DETECTED NOT DETECTED Final   Bordetella pertussis NOT DETECTED NOT DETECTED Final   Chlamydophila pneumoniae NOT DETECTED NOT DETECTED Final   Mycoplasma pneumoniae NOT DETECTED NOT DETECTED Final    Comment: Performed at [facility], [address] [place], [place] [address]  MRSA PCR Screening     Status: None   Collection Time: 05/23/16 11:28 AM  Result Value Ref Range Status   MRSA by PCR NEGATIVE NEGATIVE Final    Comment:        The GeneXpert MRSA Assay (FDA approved for NASAL specimens only), is one component of a comprehensive MRSA colonization surveillance program. It is not intended to diagnose MRSA infection nor to guide or monitor treatment for MRSA infections.       Studies: No results found.  Scheduled Meds: . arformoterol  15 mcg Nebulization BID  . azithromycin  250 mg Oral Daily  . budesonide (PULMICORT) nebulizer solution  0.5 mg Nebulization BID  . cefTRIAXone (ROCEPHIN)  IV  1 g Intravenous Q24H  . enoxaparin (LOVENOX) injection  40 mg Subcutaneous Daily  . feeding supplement (ENSURE ENLIVE)  237 mL Oral BID BM  . guaiFENesin  600 mg Oral BID  . ipratropium-albuterol  3 mL Nebulization QID  . predniSONE  40 mg Oral Q breakfast    Continuous Infusions:  Time spent: 58mins  Paislynn Hegstrom MD, PhD  [facility] Pager [phone]. If 7PM-7AM, please contact night-coverage at [email or web address], password [facility] 05/25/2016, 9:00 AM  LOS: 3 days

## 2016-05-26 LAB — BASIC METABOLIC PANEL
ANION GAP: 5 (ref 5–15)
BUN: 13 mg/dL (ref 6–20)
CHLORIDE: 104 mmol/L (ref 101–111)
CO2: 31 mmol/L (ref 22–32)
Calcium: 8.9 mg/dL (ref 8.9–10.3)
Creatinine, Ser: 0.71 mg/dL (ref 0.44–1.00)
GFR calc Af Amer: >60 mL/min (ref >60)
GFR calc non Af Amer: >60 mL/min (ref >60)
GLUCOSE: 92 mg/dL (ref 65–99)
POTASSIUM: 3.8 mmol/L (ref 3.5–5.1)
Sodium: 140 mmol/L (ref 135–145)

## 2016-05-26 LAB — CBC WITH DIFFERENTIAL/PLATELET
BASOS ABS: 0 10*3/uL (ref 0.0–0.1)
Basophils Relative: 0 %
Eosinophils Absolute: 0.3 10*3/uL (ref 0.0–0.7)
Eosinophils Relative: 4 %
HEMATOCRIT: 37.1 % (ref 36.0–46.0)
Hemoglobin: 12.5 g/dL (ref 12.0–15.0)
LYMPHS PCT: 39 %
Lymphs Abs: 2.9 10*3/uL (ref 0.7–4.0)
MCH: 31.7 pg (ref 26.0–34.0)
MCHC: 33.7 g/dL (ref 30.0–36.0)
MCV: 94.2 fL (ref 78.0–100.0)
Monocytes Absolute: 0.4 10*3/uL (ref 0.1–1.0)
Monocytes Relative: 6 %
NEUTROS ABS: 3.9 10*3/uL (ref 1.7–7.7)
Neutrophils Relative %: 51 %
Platelets: 215 10*3/uL (ref 150–400)
RBC: 3.94 MIL/uL (ref 3.87–5.11)
RDW: 12.9 % (ref 11.5–15.5)
WBC: 7.5 10*3/uL (ref 4.0–10.5)

## 2016-05-26 LAB — CULTURE, RESPIRATORY: CULTURE: NORMAL

## 2016-05-26 LAB — CULTURE, RESPIRATORY W GRAM STAIN

## 2016-05-26 LAB — MAGNESIUM: Magnesium: 2.2 mg/dL (ref 1.7–2.4)

## 2016-05-26 MED ORDER — GUAIFENESIN ER 600 MG PO TB12: 600.0000 mg | ORAL_TABLET | Freq: Two times a day (BID) | ORAL | 0 refills | Status: AC

## 2016-05-26 MED ORDER — PREDNISONE 10 MG PO TABS: ORAL_TABLET | ORAL | 0 refills | Status: DC

## 2016-05-26 MED ORDER — AMOXICILLIN-POT CLAVULANATE 875-125 MG PO TABS: 1.0000 | ORAL_TABLET | Freq: Two times a day (BID) | ORAL | Status: DC

## 2016-05-26 MED ORDER — ENSURE ENLIVE PO LIQD: 237.0000 mL | Freq: Two times a day (BID) | ORAL | 12 refills | Status: AC

## 2016-05-26 MED ORDER — AMOXICILLIN-POT CLAVULANATE 875-125 MG PO TABS: 1.0000 | ORAL_TABLET | Freq: Two times a day (BID) | ORAL | 0 refills | Status: AC

## 2016-05-26 MED ORDER — SACCHAROMYCES BOULARDII 250 MG PO CAPS: 250.0000 mg | ORAL_CAPSULE | Freq: Two times a day (BID) | ORAL | 0 refills | Status: DC

## 2016-05-26 NOTE — Care Management Note (Signed)
Case Management Note  Patient Details  Name: Tonye Tancredi MRN: [id number] Date of Birth: 09-17-1950  Subjective/Objective:                    Action/Plan:   Expected Discharge Date:  05/26/16               Expected Discharge Plan:  [place]  In-House Referral:  NA  Discharge planning Services  CM Consult  Post Acute Care Choice:  Durable Medical Equipment Choice offered to:  Patient  DME Arranged:  Nebulizer/meds, Pulse oximeter, Oxygen, Other [facility] at [facility] through and [facility] D[facility] Agency:  [place]:    [place] Agency:     Status of Service:  Completed, signed off  If discussed at [place] of Stay Meetings, dates discussed:    Additional Comments:  Leeroy Cha, RN 05/26/2016, 1:59 PM

## 2016-05-26 NOTE — Discharge Summary (Addendum)
Discharge Summary  Lekesha Johnston BDZ:[id number] DOB: November 27, 1950  PCP: [place] date: 05/22/2016 Discharge date: 05/26/2016  Time spent: >31mins, more than 50% time spent on coordination of care  Recommendations for Outpatient Follow-up:  1. F/u with PMD within a week  for hospital discharge follow up, repeat cbc/bmp at follow up 2. F/u with pulmonology Dr Danton Sewer at the [facility] 3. Home o2 equipment   Discharge Diagnoses:  Active Hospital Problems   Diagnosis Date Noted  . COPD with acute exacerbation ([place]) 04/19/2015  . Acute and chronic respiratory failure with hypoxia ([place]) 05/22/2016    Resolved Hospital Problems   Diagnosis Date Noted Date Resolved  No resolved problems to display.    Discharge Condition: stable  Diet recommendation: regular diet  Filed Weights   05/22/16 1941  Weight: 58.1 kg (128 lb)    History of present illness:  Patient coming from: Home  I have personally briefly reviewed patient's old medical records in [place]  Chief Complaint: SOB  HPI: Patricia Johnston is a 66 y.o. female with medical history significant of COPD with PRN home O2, ususally followed at [place].  Patient presents to the ED with c/o SOB, wheezing, cough.  Yellow sputum production.  Symptoms worsened yesterday but had been bad for the past couple of days.  No CP.  Has required more O2 than baseline.  Home nebs provided no relief.   ED Course: Now breathing is better after hour long neb and solumedrol although she has "jitters" from the hour long neb.  Hospital Course:  Principal Problem:   COPD with acute exacerbation ([place]) Active Problems:   Acute and chronic respiratory failure with hypoxia (H[facility])   Acute on chronic hypoxic respiratory failure due to bronchitis/asthma/acute COPD exacerbation ( baseline active, only on o2 2liter prn for exertion, not steroid dependent)  Patient has a significant pulmonary history, she is a never  smoker, but started to have respiratory symptom after enviromental exposure in the arm many years ago. She has undergone multiple testing, she has elevated IgE per chart review She presented with significant respiratory distress with hypoxic 84% on three liter o2, sinus tachycardia in the 120's, tachypnea with RR 25 cxr + for emphysema but no infiltrates, she does not have fever, no leukocytosis, flu /mrsa scree negative, urine strep pneumo antigen negative, respiratory viral penal + rhinovirus and sputum culture Consistent with normal respiratory flora.  She received continuous nebs in the ED , now on scheduled duoneb, steroids, zithromax , she is reluctant to be on high dose of steroids.  Rocephin added on 3/26 due to continued  productive cough, wheezing , not back to baseline,  chest CT on 3/26: "Emphysematous and bronchitic changes compatible with history of COPD and asthma. Minimal mucous plugging in a few RIGHT upper lobe bronchi with minimal scattered tree-in-bud reticulonodular infiltrates in the upper lobes greater on RIGHT, question representing infection or bronchiolitis. Small hiatal hernia. Aortic atherosclerosis."  She is improving, less wheezing and less cough, home o2 2liter 24/7 arranged, she is discharged on oral augmentin, steroid taper, she is to follow with her pmd and pulmonologist at the [facility].  Anxiety: she does not want to be on high dose steroids, report concerned about bone side effect.     Code Status: full  Family Communication: patient   Disposition Plan: home in 1-2 days   Consultants:  none  Procedures:  none  Antibiotics:  zithro from admission  Rocephin from 3/26--  Discharge Exam: BP 125/85 (BP Location: Left Arm)   Pulse 70   Temp 98 F (36.7 C) (Oral)   Resp 20   Ht 5\' 5"  (1.651 m)   Wt 58.1 kg (128 lb)   SpO2 95% Comment: Pt has a cont. pulse ox   BMI 21.30 kg/m   General: NAD Cardiovascular:  RRR Respiratory: less wheezing,   Abdomen: Soft/ND/NT, positive BS  Musculoskeletal: No Edema  Neuro: aaox3  Discharge Instructions You were cared for by a hospitalist during your hospital stay. If you have any questions about your discharge medications or the care you received while you were in the hospital after you are discharged, you can call the unit and asked to speak with the hospitalist on call if the hospitalist that took care of you is not available. Once you are discharged, your primary care physician will handle any further medical issues. Please note that NO REFILLS for any discharge medications will be authorized once you are discharged, as it is imperative that you return to your primary care physician (or establish a relationship with a primary care physician if you do not have one) for your aftercare needs so that they can reassess your need for medications and monitor your lab values.  Discharge Instructions    Diet general    Complete by:  As directed    Increase activity slowly    Complete by:  As directed      Allergies as of 05/26/2016      Reactions   Flonase [fluticasone Propionate] Other (See Comments)   Nasal bleeding   Nasacort [triamcinolone] Other (See Comments)   Nasal bleeding      Medication List    TAKE these medications   albuterol 108 (90 Base) MCG/ACT inhaler Commonly known as:  PROVENTIL HFA;VENTOLIN HFA Inhale 1-2 puffs into the lungs every 6 (six) hours as needed for wheezing or shortness of breath.   amoxicillin-clavulanate 875-125 MG tablet Commonly known as:  AUGMENTIN Take 1 tablet by mouth every 12 (twelve) hours.   Dextromethorphan-Guaifenesin 5-100 MG/5ML Liqd Take 5 mLs by mouth every 4 (four) hours as needed (cough).   diphenhydrAMINE 25 MG tablet Commonly known as:  BENADRYL Take 25 mg by mouth every 6 (six) hours as needed (anxiety).   feeding supplement (ENSURE ENLIVE) Liqd Take 237 mLs by mouth 2 (two) times daily  between meals.   guaiFENesin 600 MG 12 hr tablet Commonly known as:  MUCINEX Take 1 tablet (600 mg total) by mouth 2 (two) times daily.   ipratropium-albuterol 0.5-2.5 (3) MG/3ML Soln Commonly known as:  DUONEB Take 3 mLs by nebulization every 6 (six) hours as needed (sob and wheezing).   mometasone 220 MCG/INH inhaler Commonly known as:  ASMANEX Inhale 2 puffs into the lungs daily.   OXYGEN Inhale 2 L into the lungs daily as needed (for exertion). Reported on 04/19/2015   PredniSONE 2 MG Tbec Take 2 tablets by mouth daily as needed (sob). What changed:  Another medication with the same name was added. Make sure you understand how and when to take each.   predniSONE 10 MG tablet Commonly known as:  DELTASONE Take 30mg  on day one, 20mg  on day tow, 10mg  on day three, then stop. What changed:  You were already taking a medication with the same name, and this prescription was added. Make sure you understand how and when to take each.   saccharomyces boulardii 250 MG capsule Commonly known as:  FLORASTOR Take 1 capsule (  250 mg total) by mouth 2 (two) times daily.   STIOLTO RESPIMAT 2.5-2.5 MCG/ACT Aers Generic drug:  Tiotropium Bromide-Olodaterol Inhale 2 puffs into the lungs daily.            Durable Medical Equipment        Start     Ordered   05/24/16 1801  For home use only DME Other see comment  Once    Comments:  Evaluate for light weight portable oxygen concentrator   05/24/16 1801   05/24/16 1620  For home use only DME Pulse oximeter  Once     05/24/16 1620   05/24/16 1613  For home use only DME Nebulizer machine  Once    Question:  Patient needs a nebulizer to treat with the following condition  Answer:  COPD (chronic obstructive pulmonary disease) ([place])   05/24/16 1613     Allergies  Allergen Reactions  . Flonase [Fluticasone Propionate] Other (See Comments)    Nasal bleeding  . Nasacort [Triamcinolone] Other (See Comments)    Nasal bleeding    Follow-up Information    [facility] Follow up.   Why:  please call to arrange follow up in one week Contact information: [place] [address] [phone]        Kathee Delton, MD Follow up.   Specialty:  Pulmonary Disease Why:  copd Contact information: [place] [address] [phone]            The results of significant diagnostics from this hospitalization (including imaging, microbiology, ancillary and laboratory) are listed below for reference.    Significant Diagnostic Studies: Dg Chest 2 View  Result Date: 05/22/2016 CLINICAL DATA:  66 year old female with cough and shortness of breath. History of asthma. EXAM: CHEST  2 VIEW COMPARISON:  Chest radiograph dated 02/27/2015 FINDINGS: There is emphysematous changes of the lungs with diffuse interstitial coarsening. There is hyperexpansion with flattening of the diaphragm and increased thoracic AP diameter. There is no focal consolidation, pleural effusion, or pneumothorax. The cardiac silhouette is within normal limits. No acute osseous pathology identified. IMPRESSION: 1. No acute cardiopulmonary process. 2. Emphysema. Electronically Signed   By: Anner Crete M.D.   On: 05/22/2016 20:48   Ct Chest Wo Contrast  Result Date: 05/25/2016 CLINICAL DATA:  Cough, epistaxis since yesterday, increased shortness of breath, acute on chronic hypoxic respiratory failure due to bronchitis, asthma, and acute COPD exacerbation, nonsmoker EXAM: CT CHEST WITHOUT CONTRAST TECHNIQUE: Multidetector CT imaging of the chest was performed following the standard protocol without IV contrast. Sagittal and coronal MPR images reconstructed from axial data set. COMPARISON:  Chest radiograph 05/22/2016 FINDINGS: Cardiovascular: Atherosclerotic calcifications aorta and innominate artery. Aorta normal caliber. No pericardial effusion. Mediastinum/Nodes: Small hiatal hernia. Esophagus  air-filled but otherwise unremarkable. Base of cervical region normal appearance. Small normal sized precarinal lymph nodes. No definite thoracic adenopathy. Lungs/Pleura: Lungs emphysematous/hyperinflated. Diffuse peribronchial thickening. Partial mucous plugging and some airways in the RIGHT upper lobe. Mild tree-in-bud peribronchovascular reticulonodular infiltrate in the upper lobes may reflect bronchiolitis or infection. Partial atelectasis RIGHT middle lobe. Remaining lungs clear without additional infiltrate, pleural effusion or pneumothorax. Upper Abdomen: Tiny cyst lateral segment LEFT lobe liver. Remaining visualized upper abdomen unremarkable. Musculoskeletal: No acute osseous abnormalities. IMPRESSION: Emphysematous and bronchitic changes compatible with history of COPD and asthma. Minimal mucous plugging in a few RIGHT upper lobe bronchi with minimal scattered tree-in-bud reticulonodular infiltrates in the upper lobes greater on RIGHT, question representing infection  or bronchiolitis. Small hiatal hernia. Aortic atherosclerosis. Electronically Signed   By: Lavonia Dana M.D.   On: 05/25/2016 16:27    Microbiology: Recent Results (from the past 240 hour(s))  Culture, expectorated sputum-assessment     Status: None   Collection Time: 05/23/16 11:27 AM  Result Value Ref Range Status   Specimen Description SPUTUM  Final   Special Requests NONE  Final   Sputum evaluation THIS SPECIMEN IS ACCEPTABLE FOR SPUTUM CULTURE  Final   Report Status 05/23/2016 FINAL  Final  Culture, respiratory (NON-Expectorated)     Status: None   Collection Time: 05/23/16 11:27 AM  Result Value Ref Range Status   Specimen Description SPUTUM  Final   Special Requests NONE Reflexed from [id number]  Final   Gram Stain   Final    ABUNDANT WBC PRESENT,BOTH PMN AND MONONUCLEAR RARE SQUAMOUS EPITHELIAL CELLS PRESENT RARE GRAM POSITIVE COCCI IN PAIRS IN CHAINS RARE GRAM NEGATIVE RODS RARE GRAM POSITIVE RODS    Culture    Final    Consistent with normal respiratory flora. Performed at [facility], [place] [address] [place], [place] [address]    Report Status 05/26/2016 FINAL  Final  Respiratory Panel by PCR     Status: Abnormal   Collection Time: 05/23/16 11:28 AM  Result Value Ref Range Status   Adenovirus NOT DETECTED NOT DETECTED Final   Coronavirus 229E NOT DETECTED NOT DETECTED Final   Coronavirus HKU1 NOT DETECTED NOT DETECTED Final   Coronavirus NL63 NOT DETECTED NOT DETECTED Final   Coronavirus OC43 NOT DETECTED NOT DETECTED Final   Metapneumovirus NOT DETECTED NOT DETECTED Final   Rhinovirus / Enterovirus DETECTED (A) NOT DETECTED Final   Influenza A NOT DETECTED NOT DETECTED Final   Influenza B NOT DETECTED NOT DETECTED Final   Parainfluenza Virus 1 NOT DETECTED NOT DETECTED Final   Parainfluenza Virus 2 NOT DETECTED NOT DETECTED Final   Parainfluenza Virus 3 NOT DETECTED NOT DETECTED Final   Parainfluenza Virus 4 NOT DETECTED NOT DETECTED Final   Respiratory Syncytial Virus NOT DETECTED NOT DETECTED Final   Bordetella pertussis NOT DETECTED NOT DETECTED Final   Chlamydophila pneumoniae NOT DETECTED NOT DETECTED Final   Mycoplasma pneumoniae NOT DETECTED NOT DETECTED Final    Comment: Performed at [facility], [place] [address] [place], [place] [address]  MRSA PCR Screening     Status: None   Collection Time: 05/23/16 11:28 AM  Result Value Ref Range Status   MRSA by PCR NEGATIVE NEGATIVE Final    Comment:        The GeneXpert MRSA Assay (FDA approved for NASAL specimens only), is one component of a comprehensive MRSA colonization surveillance program. It is not intended to diagnose MRSA infection nor to guide or monitor treatment for MRSA infections.      Labs: Basic Metabolic Panel:  Recent Labs Lab 05/22/16 2136 05/24/16 0524 05/25/16 0549 05/26/16 0557  NA 143 142 142 140  K 4.3 3.4* 3.6 3.8  CL 108 105 103 104  CO2 29 29 33* 31  GLUCOSE 115* 115* 89 92   BUN 10 17 14 13   CREATININE 0.82 0.74 0.76 0.71  CALCIUM 9.5 9.0 8.9 8.9  MG  --  2.2  --  2.2   Liver Function Tests:  Recent Labs Lab 05/24/16 0524  AST 25  ALT 20  ALKPHOS 70  BILITOT 0.7  PROT 6.7  ALBUMIN 3.8   No results for input(s): LIPASE, AMYLASE in  the last 168 hours. No results for input(s): AMMONIA in the last 168 hours. CBC:  Recent Labs Lab 05/22/16 2136 05/26/16 0557  WBC 6.1 7.5  NEUTROABS  --  3.9  HGB 14.2 12.5  HCT 41.7 37.1  MCV 93.3 94.2  PLT 223 215   Cardiac Enzymes: No results for input(s): CKTOTAL, CKMB, CKMBINDEX, TROPONINI in the last 168 hours. BNP: BNP (last 3 results) No results for input(s): BNP in the last 8760 hours.  ProBNP (last 3 results) No results for input(s): PROBNP in the last 8760 hours.  CBG: No results for input(s): GLUCAP in the last 168 hours.     SignedFlorencia Reasons MD, PhD  [facility] 05/26/2016, 11:49 AM

## 2016-05-26 NOTE — Progress Notes (Signed)
Pt discharged from the unit. Discharge instructions were reviewed with the pt. [facility] delivered home health equipment. Pt had questions regarding getting medications before getting to [facility] for medications. Pt was informed that [place] dose not provide 1 time doses. Pt is aware and is calling [facility] regarding her [employer] benefits. Pt has no questions or concerns at this time.  Gurfateh Mcclain W Cedarius Kersh, RN

## 2016-05-26 NOTE — Progress Notes (Addendum)
SATURATION QUALIFICATIONS: (This note is used to comply with regulatory documentation for home oxygen)  Patient Saturations on Room Air at Rest = 92%  Patient Saturations on Room Air while Ambulating = 88%  Patient Saturations on 1 Liters of oxygen while Ambulating = 93%  Please briefly explain why patient needs home oxygen:

## 2016-05-26 NOTE — Care Management Important Message (Signed)
Important Message  Patient Details  Name: Kilani Joffe MRN: [id number] Date of Birth: 1950-06-06   Medicare Important Message Given:  Yes    Kerin Salen 05/26/2016, 11:56 AMImportant Message  Patient Details  Name: Season Astacio MRN: [id number] Date of Birth: 11/19/1950   Medicare Important Message Given:  Yes    Kerin Salen 05/26/2016, 11:56 AM

## 2016-05-26 NOTE — Care Management Note (Signed)
Case Management Note  Patient Details  Name: Patricia Johnston MRN: [id number] Date of Birth: 10-14-1950  Subjective/Objective:        Discharged to home with dme            Action/Plan:  O2, pulse oximeter/nebulizer ordered through [facility]   Expected Discharge Date:  05/26/16               Expected Discharge Plan:  Home/Self Care  In-House Referral:  NA  Discharge planning Services  CM Consult  Post Acute Care Choice:  Durable Medical Equipment Choice offered to:  Patient  DME Arranged:  Oxygen, Pulse oximeter, Nebulizer/meds DME Agency:  [place]:    [facility] Agency:     Status of Service:  Completed, signed off  If discussed at [place] of Stay Meetings, dates discussed:    Additional Comments:  Leeroy Cha, RN 05/26/2016, 11:55 AM

## 2016-09-16 DIAGNOSIS — J343 Hypertrophy of nasal turbinates: Secondary | ICD-10-CM | POA: Diagnosis not present

## 2016-09-16 DIAGNOSIS — J338 Other polyp of sinus: Secondary | ICD-10-CM | POA: Diagnosis not present

## 2016-09-16 DIAGNOSIS — J310 Chronic rhinitis: Secondary | ICD-10-CM | POA: Diagnosis not present

## 2016-10-07 DIAGNOSIS — J310 Chronic rhinitis: Secondary | ICD-10-CM | POA: Diagnosis not present

## 2016-10-07 DIAGNOSIS — J338 Other polyp of sinus: Secondary | ICD-10-CM | POA: Diagnosis not present

## 2016-10-07 DIAGNOSIS — J343 Hypertrophy of nasal turbinates: Secondary | ICD-10-CM | POA: Diagnosis not present

## 2017-03-18 ENCOUNTER — Other Ambulatory Visit: Payer: Self-pay

## 2017-03-18 ENCOUNTER — Ambulatory Visit: Payer: Non-veteran care | Attending: Infectious Diseases | Admitting: Physical Therapy

## 2017-03-18 DIAGNOSIS — M25561 Pain in right knee: Secondary | ICD-10-CM | POA: Diagnosis present

## 2017-03-18 DIAGNOSIS — G8929 Other chronic pain: Secondary | ICD-10-CM | POA: Diagnosis present

## 2017-03-18 DIAGNOSIS — M25562 Pain in left knee: Secondary | ICD-10-CM | POA: Diagnosis not present

## 2017-03-18 DIAGNOSIS — R262 Difficulty in walking, not elsewhere classified: Secondary | ICD-10-CM | POA: Insufficient documentation

## 2017-03-19 ENCOUNTER — Encounter: Payer: Self-pay | Admitting: Physical Therapy

## 2017-03-19 NOTE — Addendum Note (Signed)
Addended by: Carney Living on: 03/19/2017 11:37 AM   Modules accepted: Orders

## 2017-03-19 NOTE — Therapy (Signed)
[place], [place], [address] Phone: [phone]   Fax:  [phone]  Physical Therapy Evaluation  Patient Details  Name: Patricia Johnston MRN: [id number] Date of Birth: 03/02/1951 Referring Provider: Dr Charlyne Petrin    Encounter Date: 03/18/2017  PT End of Session - 03/19/17 1106    Visit Number  1    Number of Visits  14    Date for PT Re-Evaluation  05/14/17    Authorization Type  1 eval 12 treats and 1 re-eval as needed.     PT Start Time  1015    PT Stop Time  1058    PT Time Calculation (min)  43 min    Activity Tolerance  Patient tolerated treatment well    Behavior During Therapy  WFL for tasks assessed/performed       Past Medical History:  Diagnosis Date  . Asthma   . COPD (chronic obstructive pulmonary disease) ([place])    pt states due to [place] contracted COPD  . Osteoporosis     Past Surgical History:  Procedure Laterality Date  . ABDOMINAL HYSTERECTOMY    . bunionectomy      There were no vitals filed for this visit.   Subjective Assessment - 03/18/17 1027    Subjective  Patient has had a long history of knee pain mostly in the left. Over thepast few months the knee pain has increased. At this time she is having pain that is effecting her ability to go to the gy,m. The patient has had an x-ray but the results were not in the [place] notes. She reports arthrits of unspecified severity. she feewls increased pain when she is standing, walking, and perfroming gym tasks.     Limitations  Standing;Walking    How long can you sit comfortably?  Sitting stiffnes up her knee     How long can you walk comfortably?  Pain walking up stairs     Diagnostic tests  Per patientx-ray of the left knee shows arthritis     Currently in Pain?  Yes    Pain Score  6     Pain Location  Knee    Pain Orientation  Left    Pain Descriptors / Indicators  Aching    Pain Type  Chronic pain    Pain Onset   More than a month ago    Pain Frequency  Constant    Aggravating Factors   sitting for too long; stairs     Pain Relieving Factors  warming up     Effect of Pain on Daily Activities  pain and stiffness with stairs     Multiple Pain Sites  Yes    Pain Score  5    Pain Location  Knee    Pain Orientation  Right    Pain Descriptors / Indicators  Aching    Pain Onset  More than a month ago    Pain Frequency  Constant    Aggravating Factors   standing; sitting for too long     Pain Relieving Factors  warming up before activity     Effect of Pain on Daily Activities  pain and stiffness with stairs          [facility] PT Assessment - 03/19/17 0001      Assessment   Medical Diagnosis  Bilateral knee pain left greater then right     Referring Provider  Dr Charlyne Petrin  Onset Date/Surgical Date  -- for about a year     Hand Dominance  Right    Next MD Visit  June 2019     Prior Therapy  None      Precautions   Precautions  None      Restrictions   Weight Bearing Restrictions  No      Balance Screen   Has the patient fallen in the past 6 months  No    Has the patient had a decrease in activity level because of a fear of falling?   No    Is the patient reluctant to leave their home because of a fear of falling?   No      Home Environment   Additional Comments  Has an upstairs inside of her house. has some difficulty with stairs       Prior Function   Level of Independence  Independent    Vocation  Retired    Leisure  jogging and harder workouts       Cognition   Overall Cognitive Status  Within Functional Limits for tasks assessed    Attention  Focused    Focused Attention  Appears intact    Memory  Appears intact    Awareness  Appears intact    Problem Solving  Appears intact      Observation/Other Assessments   Focus on Therapeutic Outcomes (FOTO)   58% limitation       Sensation   Light Touch  Appears Intact      Coordination   Gross Motor Movements are Fluid and  Coordinated  Yes    Fine Motor Movements are Fluid and Coordinated  Yes      Functional Tests   Functional tests  Single leg stance      Single Leg Stance   Comments  decreased single leg stability on the right       AROM   Overall AROM Comments  Pain with end range left knee flexion; pain with full knee extension      PROM   Overall PROM Comments  pain with end range knee flexion       Strength   Right/Left Hip  Right;Left    Right Hip Flexion  5/5    Right Hip ABduction  5/5    Right Hip ADduction  5/5    Left Hip Flexion  4+/5    Left Hip ABduction  5/5    Left Hip ADduction  5/5    Right/Left Knee  Right;Left    Right Knee Flexion  5/5    Right Knee Extension  5/5    Left Knee Flexion  5/5    Left Knee Extension  5/5      Flexibility   Soft Tissue Assessment /Muscle Length  yes    Hamstrings  right 90/90 34 degree limitation left 20 degree limiation       Palpation   Patella mobility  mild patelal crepitus with left knee flexion     Palpation comment  tenderness to palpaption in the mid/larteral hamstring area      Special Tests    Special Tests  Knee Special Tests    Knee Special tests   McConnell Test;Patellofemoral Grind Test (Clarke's Sign)      McConnell Test   Comments  (-) bilateral       Patellofemoral Grind test (Clark's Sign)   Comments  (-) bilateral       Ambulation/Gait   Gait  Comments  No significant limiations noted              Objective measurements completed on examination: See above findings.      [facility] Adult PT Treatment/Exercise - 03/19/17 0001      Knee/Hip Exercises: Stretches   Passive Hamstring Stretch  3 reps;20 seconds;Both    Quad Stretch Limitations  thomas stretch 3x20 sec hold      quad set 2x10   SLR 2x10  Bridging 2x10         PT Education - 03/19/17 1106    Education provided  Yes    Education Details  reviewed HEP; symptom mangement     Person(s) Educated  Patient    Methods   Explanation;Demonstration;Tactile cues;Verbal cues    Comprehension  Verbalized understanding;Returned demonstration;Verbal cues required;Tactile cues required       PT Short Term Goals - 03/19/17 1124      PT SHORT TERM GOAL #1   Title  Patient will dmeostrate full pain free left knee AROM     Baseline  pain with end range     Time  4    Period  Weeks    Status  New    Target Date  04/16/17      PT SHORT TERM GOAL #2   Title  Patient will increase left single leg stance time to 30 seconds without pain     Time  4    Period  Weeks    Status  New    Target Date  04/16/17      PT SHORT TERM GOAL #3   Title  Patient will increase gross right lower extremity strength to 5/5     Time  4    Period  Weeks    Status  New    Target Date  04/16/17        PT Long Term Goals - 03/19/17 1125      PT LONG TERM GOAL #1   Title  Patient will go up/down 8 steps without increased pain     Time  8    Period  Weeks    Status  New    Target Date  05/14/17      PT LONG TERM GOAL #2   Title  Patient will return to the gym without increased pain with a return to prior level of function such as light jogging     Baseline  unable to perfrom LE gym activity     Time  8    Period  Weeks    Status  New      PT LONG TERM GOAL #3   Title  Patient will demostrate a 42% limitation on FOTO     Baseline  58% limitation     Time  8    Period  Weeks    Status  New    Target Date  05/14/17             Plan - 03/19/17 1112    Clinical Impression Statement  Patient is a 67 year old female who presents with bilateral knee pain left > right. She has pain with end range knee flexion on the left and mild crepiutus with patellar compression. 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 She also has signs and syptoms of hamstring tendinits. She has pain with ahmstring strethcing and less hanstring motion on  the left  compared to the right.     Clinical Presentation  Evolving    Clinical Decision Making  Moderate    Rehab Potential  Good    PT Frequency  2x / week    PT Duration  8 weeks    PT Treatment/Interventions  ADLs/Self Care Home Management;Cryotherapy;Electrical Stimulation;Iontophoresis 4mg /ml Dexamethasone;Moist Heat;Gait training;Stair training;Therapeutic activities;Therapeutic exercise;Patient/family education;Neuromuscular re-education;Manual techniques;Passive range of motion;Dry needling;Taping    PT Next Visit Plan  review exercises; consider SAQ; standing 3 way hip; heel raise; single leg stance; light leg press; consdier taping     PT Home Exercise Plan  quad set; SLR; bridge; hamstring stretch; thomas stretch     Consulted and Agree with Plan of Care  Patient       Patient will benefit from skilled therapeutic intervention in order to improve the following deficits and impairments:  Pain, Decreased range of motion, Difficulty walking, Decreased activity tolerance  Visit Diagnosis: Chronic pain of left knee  Chronic pain of right knee  Difficulty in walking, not elsewhere classified     Problem List Patient Active Problem List   Diagnosis Date Noted  . Acute and chronic respiratory failure with hypoxia ([place]) 05/22/2016  . Bronchiectasis without complication ([place]) 70/14/1030  . Elevated IgE level 11/14/2015  . Eosinophilia 11/14/2015  . COPD with acute exacerbation ([place]) 04/19/2015  . Feeling of sadness 04/19/2015    Carney Living PT DPT  03/19/2017, 11:30 AM  [facility] [address] [place], [place], [address] Phone: [phone]   Fax:  [phone]  Name: Margaree Sandhu MRN: [id number] Date of Birth: 1950/04/13

## 2017-03-30 ENCOUNTER — Encounter: Payer: Self-pay | Admitting: Physical Therapy

## 2017-03-30 ENCOUNTER — Ambulatory Visit: Payer: Non-veteran care | Admitting: Physical Therapy

## 2017-03-30 DIAGNOSIS — M25561 Pain in right knee: Secondary | ICD-10-CM

## 2017-03-30 DIAGNOSIS — M25562 Pain in left knee: Principal | ICD-10-CM

## 2017-03-30 DIAGNOSIS — G8929 Other chronic pain: Secondary | ICD-10-CM

## 2017-03-30 DIAGNOSIS — R262 Difficulty in walking, not elsewhere classified: Secondary | ICD-10-CM

## 2017-03-31 ENCOUNTER — Encounter: Payer: Self-pay | Admitting: Physical Therapy

## 2017-03-31 NOTE — Therapy (Signed)
[place], [place], [address] Phone: [phone]   Fax:  [phone]  Physical Therapy Treatment  Patient Details  Name: Patricia Johnston MRN: [id number] Date of Birth: 02-Oct-1950 Referring Provider: Dr Charlyne Petrin    Encounter Date: 03/30/2017  PT End of Session - 03/30/17 1443    Visit Number  2    Number of Visits  14    Date for PT Re-Evaluation  05/14/17    Authorization Type  1 eval 12 treats and 1 re-eval as needed.     PT Start Time  1419    PT Stop Time  1500    PT Time Calculation (min)  41 min    Activity Tolerance  Patient tolerated treatment well    Behavior During Therapy  WFL for tasks assessed/performed       Past Medical History:  Diagnosis Date  . Asthma   . COPD (chronic obstructive pulmonary disease) ([place])    pt states due to [place] contracted COPD  . Osteoporosis     Past Surgical History:  Procedure Laterality Date  . ABDOMINAL HYSTERECTOMY    . bunionectomy      There were no vitals filed for this visit.  Subjective Assessment - 03/30/17 1438    Subjective  The pain in the back of her knees improved. But she is having pain in both knees around her lateral knee cap. The pain has become significant reaching an 8/10. She went for a long walk on Sunday which she feels like may have contributed.     Limitations  Standing;Walking    How long can you sit comfortably?  Sitting stiffnes up her knee     How long can you walk comfortably?  Pain walking up stairs     Diagnostic tests  Per patientx-ray of the left knee shows arthritis     Currently in Pain?  Yes    Pain Score  8     Pain Location  Knee    Pain Orientation  Right;Left    Pain Descriptors / Indicators  Aching    Pain Type  Chronic pain    Pain Onset  More than a month ago    Pain Frequency  Constant    Aggravating Factors   sitting for too long     Pain Relieving Factors  warming up     Effect of Pain  on Daily Activities  pain and stiffness with the stairs.     Multiple Pain Sites  No                      [facility] Adult PT Treatment/Exercise - 03/31/17 0001      Knee/Hip Exercises: Stretches   Passive Hamstring Stretch  3 reps;20 seconds;Both    Quad Stretch Limitations  thomas stretch 3x20 sec hold       Knee/Hip Exercises: Standing   Heel Raises  2 sets;10 reps    Other Standing Knee Exercises  Standing march 2x10;      Knee/Hip Exercises: Supine   Bridges  2 sets;10 reps    Straight Leg Raises  2 sets;10 reps;Limitations    Straight Leg Raises Limitations  min cuing for technique       Manual Therapy   Manual Therapy  Taping    McConnell  bilateral Mconeell taping for latewral patellar trackin. Described purpose and tehcnique.  PT Education - 03/31/17 0844    Education provided  Yes    Education Details  benefits of taping     Person(s) Educated  Patient    Methods  Explanation;Tactile cues;Demonstration;Verbal cues    Comprehension  Verbalized understanding;Returned demonstration;Verbal cues required;Tactile cues required       PT Short Term Goals - 03/19/17 1124      PT SHORT TERM GOAL #1   Title  Patient will dmeostrate full pain free left knee AROM     Baseline  pain with end range     Time  4    Period  Weeks    Status  New    Target Date  04/16/17      PT SHORT TERM GOAL #2   Title  Patient will increase left single leg stance time to 30 seconds without pain     Time  4    Period  Weeks    Status  New    Target Date  04/16/17      PT SHORT TERM GOAL #3   Title  Patient will increase gross right lower extremity strength to 5/5     Time  4    Period  Weeks    Status  New    Target Date  04/16/17        PT Long Term Goals - 03/19/17 1125      PT LONG TERM GOAL #1   Title  Patient will go up/down 8 steps without increased pain     Time  8    Period  Weeks    Status  New    Target Date  05/14/17      PT LONG  TERM GOAL #2   Title  Patient will return to the gym without increased pain with a return to prior level of function such as light jogging     Baseline  unable to perfrom LE gym activity     Time  8    Period  Weeks    Status  New      PT LONG TERM GOAL #3   Title  Patient will demostrate a 42% limitation on FOTO     Baseline  58% limitation     Time  8    Period  Weeks    Status  New    Target Date  05/14/17            Plan - 03/31/17 0846    Clinical Impression Statement  Patient reported no increase in pain after the taping. She was ableto perfrom standing exercises without pain. No modalites perfromed on her hamstring because she is not haiving pain in her hasmtrings. If patient tolerates taping well therapy will have her do it next vist. Therapy updated HEP. Min cuing gfor her current HEP.      Clinical Presentation  Evolving    Clinical Decision Making  Moderate    Rehab Potential  Good    PT Frequency  2x / week    PT Duration  8 weeks    PT Treatment/Interventions  ADLs/Self Care Home Management;Cryotherapy;Electrical Stimulation;Iontophoresis 4mg /ml Dexamethasone;Moist Heat;Gait training;Stair training;Therapeutic activities;Therapeutic exercise;Patient/family education;Neuromuscular re-education;Manual techniques;Passive range of motion;Dry needling;Taping    PT Next Visit Plan  review exercises; consider SAQ; standing 3 way hip; heel raise; single leg stance; light leg press; consdier taping     PT Home Exercise Plan  quad set; SLR; bridge; hamstring stretch; thomas stretch     Consulted  and Agree with Plan of Care  Patient       Patient will benefit from skilled therapeutic intervention in order to improve the following deficits and impairments:  Pain, Decreased range of motion, Difficulty walking, Decreased activity tolerance  Visit Diagnosis: Chronic pain of left knee  Chronic pain of right knee  Difficulty in walking, not elsewhere classified     Problem  List Patient Active Problem List   Diagnosis Date Noted  . Acute and chronic respiratory failure with hypoxia ([place]) 05/22/2016  . Bronchiectasis without complication ([place]) 55/03/5866  . Elevated IgE level 11/14/2015  . Eosinophilia 11/14/2015  . COPD with acute exacerbation ([place]) 04/19/2015  . Feeling of sadness 04/19/2015    Carney Living PT DPT  03/31/2017, 9:04 AM  [facility] [address] [place], [place], [address] Phone: [phone]   Fax:  [phone]  Name: Nedda Gains MRN: [id number] Date of Birth: 11-26-1950

## 2017-04-01 ENCOUNTER — Encounter: Payer: Self-pay | Admitting: Physical Therapy

## 2017-04-01 ENCOUNTER — Ambulatory Visit: Payer: Non-veteran care | Admitting: Physical Therapy

## 2017-04-01 DIAGNOSIS — M25562 Pain in left knee: Principal | ICD-10-CM

## 2017-04-01 DIAGNOSIS — G8929 Other chronic pain: Secondary | ICD-10-CM

## 2017-04-01 DIAGNOSIS — M25561 Pain in right knee: Secondary | ICD-10-CM

## 2017-04-01 DIAGNOSIS — R262 Difficulty in walking, not elsewhere classified: Secondary | ICD-10-CM

## 2017-04-02 ENCOUNTER — Encounter: Payer: Self-pay | Admitting: Physical Therapy

## 2017-04-02 NOTE — Therapy (Signed)
[place], [place], [address] Phone: [phone]   Fax:  [phone]  Physical Therapy Treatment  Patient Details  Name: Patricia Johnston MRN: [id number] Date of Birth: 02-Jun-1950 Referring Provider: Dr Charlyne Petrin    Encounter Date: 04/01/2017  PT End of Session - 04/02/17 0806    Visit Number  3    Number of Visits  14    Date for PT Re-Evaluation  05/14/17    Authorization Type  1 eval 12 treats and 1 re-eval as needed.     PT Start Time  1145    PT Stop Time  1228    PT Time Calculation (min)  43 min    Activity Tolerance  Patient tolerated treatment well    Behavior During Therapy  WFL for tasks assessed/performed       Past Medical History:  Diagnosis Date  . Asthma   . COPD (chronic obstructive pulmonary disease) ([place])    pt states due to [place] contracted COPD  . Osteoporosis     Past Surgical History:  Procedure Laterality Date  . ABDOMINAL HYSTERECTOMY    . bunionectomy      There were no vitals filed for this visit.  Subjective Assessment - 04/02/17 0804    Subjective  Patient reports only her left knee is hurting today. She reports it is better with the taping but the pain is about 7/10 around her lateral knee cap.     Limitations  Standing;Walking    How long can you sit comfortably?  Sitting stiffnes up her knee     How long can you walk comfortably?  Pain walking up stairs     Diagnostic tests  Per patientx-ray of the left knee shows arthritis     Currently in Pain?  Yes    Pain Score  7     Pain Location  Knee    Pain Orientation  Right;Left    Pain Descriptors / Indicators  Aching    Pain Type  Chronic pain    Pain Onset  More than a month ago    Pain Frequency  Constant    Aggravating Factors   sitting for too long     Pain Relieving Factors  warming up     Effect of Pain on Daily Activities  pain and stiffness     Multiple Pain Sites  No                       [facility] Adult PT Treatment/Exercise - 04/02/17 0001      Self-Care   Self-Care  Other Self-Care Comments    Other Self-Care Comments   Patient taped her own left leg. Min cuing for proper taping technique.       Knee/Hip Exercises: Stretches   Passive Hamstring Stretch  3 reps;20 seconds      Knee/Hip Exercises: Standing   Heel Raises  2 sets;10 reps    Forward Step Up  2 sets;10 reps;Step Height: 4";Limitations    Forward Step Up Limitations  mod cuing to go slow into temrinl extension     Other Standing Knee Exercises  Standing march 2x10;      Knee/Hip Exercises: Supine   Bridges  2 sets;10 reps    Straight Leg Raises  2 sets;10 reps    Other Supine Knee/Hip Exercises  leg press 2x10 Mod cuing to not go agressivly into terminal extension  Manual Therapy   Manual Therapy  Taping    McConnell  reviewed how to tape the right knee.              PT Education - 04/02/17 0805    Education provided  Yes    Education Details  Self taping; reviewed technique with stairs     Person(s) Educated  Patient    Methods  Explanation;Demonstration;Tactile cues;Verbal cues    Comprehension  Verbalized understanding;Returned demonstration;Verbal cues required;Tactile cues required       PT Short Term Goals - 04/02/17 9924      PT SHORT TERM GOAL #1   Title  Patient will dmeostrate full pain free left knee AROM     Baseline  pain with end range     Time  4    Period  Weeks    Status  On-going      PT SHORT TERM GOAL #2   Title  Patient will increase left single leg stance time to 30 seconds without pain     Baseline  working on single leg stance     Time  4    Period  Weeks    Status  On-going      PT SHORT TERM GOAL #3   Title  Patient will increase gross right lower extremity strength to 5/5     Time  4    Period  Weeks    Status  On-going        PT Long Term Goals - 03/19/17 1125      PT LONG TERM GOAL #1   Title  Patient will  go up/down 8 steps without increased pain     Time  8    Period  Weeks    Status  New    Target Date  05/14/17      PT LONG TERM GOAL #2   Title  Patient will return to the gym without increased pain with a return to prior level of function such as light jogging     Baseline  unable to perfrom LE gym activity     Time  8    Period  Weeks    Status  New      PT LONG TERM GOAL #3   Title  Patient will demostrate a 42% limitation on FOTO     Baseline  58% limitation     Time  8    Period  Weeks    Status  New    Target Date  05/14/17            Plan - 04/02/17 0807    Clinical Impression Statement  Therapy showed the patient how to tape her right knee then she taped her own left knee with minimal cuing. She was also given a youtube video on how to tape herself. Thersapy began stair training with the patient. She uses momentum when she is going up stairs and forcefully locks her knees. Therapy reviewed controled     Clinical Presentation  Evolving    Clinical Decision Making  Moderate    Rehab Potential  Good    PT Frequency  2x / week    PT Duration  8 weeks    PT Treatment/Interventions  ADLs/Self Care Home Management;Cryotherapy;Electrical Stimulation;Iontophoresis 4mg /ml Dexamethasone;Moist Heat;Gait training;Stair training;Therapeutic activities;Therapeutic exercise;Patient/family education;Neuromuscular re-education;Manual techniques;Passive range of motion;Dry needling;Taping    PT Next Visit Plan  review exercises; consider SAQ; standing 3 way hip; heel raise; single leg stance;  light leg press; consdier taping     PT Home Exercise Plan  quad set; SLR; bridge; hamstring stretch; thomas stretch     Consulted and Agree with Plan of Care  Patient       Patient will benefit from skilled therapeutic intervention in order to improve the following deficits and impairments:  Pain, Decreased range of motion, Difficulty walking, Decreased activity tolerance  Visit  Diagnosis: Chronic pain of left knee  Chronic pain of right knee  Difficulty in walking, not elsewhere classified     Problem List Patient Active Problem List   Diagnosis Date Noted  . Acute and chronic respiratory failure with hypoxia ([place]) 05/22/2016  . Bronchiectasis without complication ([place]) 78/93/8101  . Elevated IgE level 11/14/2015  . Eosinophilia 11/14/2015  . COPD with acute exacerbation ([place]) 04/19/2015  . Feeling of sadness 04/19/2015    Carney Living PT DPT  04/02/2017, 8:27 AM  [facility] [address] [place], [place], [address] Phone: [phone]   Fax:  [phone]  Name: Patricia Johnston MRN: [id number] Date of Birth: Jul 16, 1950

## 2017-04-06 ENCOUNTER — Ambulatory Visit: Payer: Non-veteran care | Attending: Infectious Diseases | Admitting: Physical Therapy

## 2017-04-06 ENCOUNTER — Telehealth: Payer: Self-pay | Admitting: Physical Therapy

## 2017-04-06 DIAGNOSIS — M25561 Pain in right knee: Secondary | ICD-10-CM | POA: Insufficient documentation

## 2017-04-06 DIAGNOSIS — G8929 Other chronic pain: Secondary | ICD-10-CM | POA: Insufficient documentation

## 2017-04-06 DIAGNOSIS — M25562 Pain in left knee: Secondary | ICD-10-CM | POA: Insufficient documentation

## 2017-04-06 DIAGNOSIS — R262 Difficulty in walking, not elsewhere classified: Secondary | ICD-10-CM | POA: Insufficient documentation

## 2017-04-06 NOTE — Telephone Encounter (Signed)
Left Message with patient regarding missed visit. Patient advised she has an appointment on 2/7 and to please call if she was not going to make it.

## 2017-04-08 ENCOUNTER — Ambulatory Visit: Payer: Non-veteran care | Admitting: Physical Therapy

## 2017-04-12 ENCOUNTER — Ambulatory Visit: Payer: Non-veteran care | Admitting: Physical Therapy

## 2017-04-12 DIAGNOSIS — M25561 Pain in right knee: Secondary | ICD-10-CM

## 2017-04-12 DIAGNOSIS — M25562 Pain in left knee: Principal | ICD-10-CM

## 2017-04-12 DIAGNOSIS — G8929 Other chronic pain: Secondary | ICD-10-CM

## 2017-04-12 DIAGNOSIS — R262 Difficulty in walking, not elsewhere classified: Secondary | ICD-10-CM | POA: Diagnosis present

## 2017-04-13 ENCOUNTER — Encounter: Payer: Self-pay | Admitting: Physical Therapy

## 2017-04-13 NOTE — Therapy (Signed)
[facility] [place], [place], [address] Phone: [phone]   Fax:  [phone]  Physical Therapy Treatment  Patient Details  Name: Patricia Johnston MRN: [id number] Date of Birth: October 10, 1950 Referring Provider: Dr Charlyne Petrin    Encounter Date: 04/12/2017  PT End of Session - 04/12/17 1220    Visit Number  4    Number of Visits  14    Date for PT Re-Evaluation  05/14/17    Authorization Type  1 eval 12 treats and 1 re-eval as needed.     PT Start Time  1200 Patient 15 minutes late    PT Stop Time  1230    PT Time Calculation (min)  30 min    Activity Tolerance  Patient tolerated treatment well    Behavior During Therapy  WFL for tasks assessed/performed       Past Medical History:  Diagnosis Date  . Asthma   . COPD (chronic obstructive pulmonary disease) ([place])    pt states due to [place] contracted COPD  . Osteoporosis     Past Surgical History:  Procedure Laterality Date  . ABDOMINAL HYSTERECTOMY    . bunionectomy      There were no vitals filed for this visit.  Subjective Assessment - 04/12/17 1216    Subjective  Patient reports her knees have been sore and stiff. She has been doing a lot at home Her left knee pain reaches a 7/10 when she is walking. She was doing other exercises outside of what PT gave her.     Limitations  Standing;Walking    How long can you sit comfortably?  Sitting stiffnes up her knee     How long can you walk comfortably?  Pain walking up stairs     Diagnostic tests  Per patientx-ray of the left knee shows arthritis     Currently in Pain?  Yes    Pain Score  7     Pain Location  Knee    Pain Orientation  Left    Pain Descriptors / Indicators  Aching    Pain Type  Chronic pain    Pain Onset  More than a month ago    Pain Frequency  Constant    Aggravating Factors   sitting for too long     Pain Relieving Factors  warming up     Effect of Pain on Daily Activities   pain and stiffnes     Multiple Pain Sites  No    Pain Location  Knee    Pain Orientation  Right    Pain Descriptors / Indicators  Aching    Pain Onset  More than a month ago    Pain Frequency  Constant    Aggravating Factors   standing and sitting for too long     Pain Relieving Factors  warming up before activity     Effect of Pain on Daily Activities  pain and stiffness with stairs                       [facility] Adult PT Treatment/Exercise - 04/13/17 0001      Self-Care   Self-Care  Other Self-Care Comments    Other Self-Care Comments   reveiwed home activity and how to decrease pain. Reviewed RICE for swelling.       Knee/Hip Exercises: Stretches   Passive Hamstring Stretch  3 reps;20 seconds    Quad Stretch Limitations  thomas stretch 3x20 sec hold       Knee/Hip Exercises: Standing   Heel Raises  2 sets;10 reps      Knee/Hip Exercises: Supine   Bridges  2 sets;10 reps    Straight Leg Raises  2 sets;10 reps             PT Education - 04/12/17 1219    Education provided  Yes    Education Details  reveiwed technique and which exercises PT wants the patient to stick with     Person(s) Educated  Patient    Methods  Explanation;Demonstration;Tactile cues;Verbal cues;Handout    Comprehension  Verbalized understanding;Returned demonstration;Verbal cues required;Tactile cues required       PT Short Term Goals - 04/02/17 0973      PT SHORT TERM GOAL #1   Title  Patient will dmeostrate full pain free left knee AROM     Baseline  pain with end range     Time  4    Period  Weeks    Status  On-going      PT SHORT TERM GOAL #2   Title  Patient will increase left single leg stance time to 30 seconds without pain     Baseline  working on single leg stance     Time  4    Period  Weeks    Status  On-going      PT SHORT TERM GOAL #3   Title  Patient will increase gross right lower extremity strength to 5/5     Time  4    Period  Weeks    Status  On-going         PT Long Term Goals - 03/19/17 1125      PT LONG TERM GOAL #1   Title  Patient will go up/down 8 steps without increased pain     Time  8    Period  Weeks    Status  New    Target Date  05/14/17      PT LONG TERM GOAL #2   Title  Patient will return to the gym without increased pain with a return to prior level of function such as light jogging     Baseline  unable to perfrom LE gym activity     Time  8    Period  Weeks    Status  New      PT LONG TERM GOAL #3   Title  Patient will demostrate a 42% limitation on FOTO     Baseline  58% limitation     Time  8    Period  Weeks    Status  New    Target Date  05/14/17            Plan - 04/13/17 0831    Clinical Impression Statement  Patient advised to do the exercises given to her by PT. It is unclear what she is doing at home. It is likley she overdid it with therapy last visit then overdid it at home. She was advised to tape herself if she is foing to be up. Patient was 15 nminutes late for her appointment.     Clinical Presentation  Evolving    Clinical Decision Making  Moderate    Rehab Potential  Good    PT Frequency  2x / week    PT Duration  8 weeks    PT Treatment/Interventions  ADLs/Self Care Home Management;Cryotherapy;Electrical Stimulation;Iontophoresis 4mg /ml Dexamethasone;Moist Heat;Gait  training;Stair training;Therapeutic activities;Therapeutic exercise;Patient/family education;Neuromuscular re-education;Manual techniques;Passive range of motion;Dry needling;Taping    PT Next Visit Plan  review exercises; consider SAQ; standing 3 way hip; heel raise; single leg stance; light leg press; consdier taping     PT Home Exercise Plan  quad set; SLR; bridge; hamstring stretch; thomas stretch     Consulted and Agree with Plan of Care  Patient       Patient will benefit from skilled therapeutic intervention in order to improve the following deficits and impairments:  Pain, Decreased range of motion, Difficulty  walking, Decreased activity tolerance  Visit Diagnosis: Chronic pain of left knee  Chronic pain of right knee     Problem List Patient Active Problem List   Diagnosis Date Noted  . Acute and chronic respiratory failure with hypoxia ([place]) 05/22/2016  . Bronchiectasis without complication ([employer]) 47/11/2955  . Elevated IgE level 11/14/2015  . Eosinophilia 11/14/2015  . COPD with acute exacerbation ([place]) 04/19/2015  . Feeling of sadness 04/19/2015    Carney Living 04/13/2017, 8:36 AM  [facility] [address] [place], [place], [address] Phone: [phone]   Fax:  [phone]  Name: Patricia Johnston MRN: [id number] Date of Birth: 02/13/1951

## 2017-04-14 ENCOUNTER — Encounter: Payer: Non-veteran care | Admitting: Physical Therapy

## 2017-04-20 ENCOUNTER — Ambulatory Visit: Payer: Non-veteran care | Admitting: Physical Therapy

## 2017-04-20 DIAGNOSIS — G8929 Other chronic pain: Secondary | ICD-10-CM

## 2017-04-20 DIAGNOSIS — M25562 Pain in left knee: Secondary | ICD-10-CM | POA: Diagnosis not present

## 2017-04-20 DIAGNOSIS — M25561 Pain in right knee: Secondary | ICD-10-CM

## 2017-04-20 DIAGNOSIS — R262 Difficulty in walking, not elsewhere classified: Secondary | ICD-10-CM

## 2017-04-21 ENCOUNTER — Encounter: Payer: Self-pay | Admitting: Physical Therapy

## 2017-04-21 NOTE — Therapy (Signed)
[place], [place], [address] Phone: [phone]   Fax:  [phone]  Physical Therapy Treatment  Patient Details  Name: Patricia Johnston MRN: [id number] Date of Birth: 1950-05-29 Referring Provider: Dr Charlyne Petrin    Encounter Date: 04/20/2017  PT End of Session - 04/20/17 1341    Visit Number  5    Number of Visits  14    Date for PT Re-Evaluation  05/14/17    Authorization Type  1 eval 12 treats and 1 re-eval as needed.     PT Start Time  1330    PT Stop Time  1412    PT Time Calculation (min)  42 min    Activity Tolerance  Patient tolerated treatment well    Behavior During Therapy  WFL for tasks assessed/performed       Past Medical History:  Diagnosis Date  . Asthma   . COPD (chronic obstructive pulmonary disease) ([place])    pt states due to [place] contracted COPD  . Osteoporosis     Past Surgical History:  Procedure Laterality Date  . ABDOMINAL HYSTERECTOMY    . bunionectomy      There were no vitals filed for this visit.  Subjective Assessment - 04/20/17 1335    Subjective  Patient continues to reports 8/10 pain in the knees when she is up and walking. The pain is not bad when she rests. Both knees are about the same. Her left is worse at times.     Limitations  Standing;Walking    How long can you sit comfortably?  Sitting stiffnes up her knee     How long can you walk comfortably?  Pain walking up stairs     Diagnostic tests  Per patientx-ray of the left knee shows arthritis     Currently in Pain?  Yes    Pain Score  8     Pain Orientation  Right;Left    Pain Descriptors / Indicators  Aching    Pain Type  Chronic pain    Pain Onset  More than a month ago    Pain Frequency  Constant    Aggravating Factors   sitting for too long     Pain Relieving Factors  warming up     Effect of Pain on Daily Activities  Pain and stiffness     Multiple Pain Sites  No                       [facility] Adult PT Treatment/Exercise - 04/21/17 0001      Self-Care   Self-Care  Other Self-Care Comments    Other Self-Care Comments   reveiwed home activity and how to decrease pain. Reviewed RICE for swelling.       Knee/Hip Exercises: Stretches   Passive Hamstring Stretch  3 reps;20 seconds    Quad Stretch Limitations  thomas stretch 3x20 sec hold       Knee/Hip Exercises: Standing   Heel Raises  2 sets;10 reps      Knee/Hip Exercises: Supine   Quad Sets  2 sets;10 reps;Limitations    Quad Sets Limitations  5 second holds     Short Arc Quad Sets  2 sets;10 reps    Bridges  2 sets;10 reps    Straight Leg Raises  2 sets;10 reps    Other Supine Knee/Hip Exercises  leg press 2x10 20 lb with cuing  PT Education - 04/20/17 1340    Education provided  Yes    Education Details  reviewed technique with exerices     Person(s) Educated  Patient    Methods  Explanation;Demonstration;Tactile cues;Verbal cues    Comprehension  Verbalized understanding;Returned demonstration;Verbal cues required;Tactile cues required       PT Short Term Goals - 04/21/17 1041      PT SHORT TERM GOAL #1   Title  Patient will dmeostrate full pain free left knee AROM     Baseline  pain with end range     Time  4    Period  Weeks    Status  On-going      PT SHORT TERM GOAL #2   Title  Patient will increase left single leg stance time to 30 seconds without pain     Baseline  working on single leg stance     Time  4    Period  Weeks    Status  On-going      PT SHORT TERM GOAL #3   Title  Patient will increase gross right lower extremity strength to 5/5     Time  4    Period  Weeks    Status  On-going        PT Long Term Goals - 03/19/17 1125      PT LONG TERM GOAL #1   Title  Patient will go up/down 8 steps without increased pain     Time  8    Period  Weeks    Status  New    Target Date  05/14/17      PT LONG TERM GOAL #2   Title   Patient will return to the gym without increased pain with a return to prior level of function such as light jogging     Baseline  unable to perfrom LE gym activity     Time  8    Period  Weeks    Status  New      PT LONG TERM GOAL #3   Title  Patient will demostrate a 42% limitation on FOTO     Baseline  58% limitation     Time  8    Period  Weeks    Status  New    Target Date  05/14/17            Plan - 04/20/17 1342    Clinical Impression Statement  Patient continues to have pain. She is just perfroming light exercises. She reports increased pain when she walks too far. Therapy continues to advise the patient to follow her symptoms. She has not been taping her knees when she is walking long distances.     Clinical Presentation  Evolving    Clinical Decision Making  Moderate    Rehab Potential  Good    PT Frequency  2x / week    PT Duration  8 weeks    PT Treatment/Interventions  ADLs/Self Care Home Management;Cryotherapy;Electrical Stimulation;Iontophoresis 4mg /ml Dexamethasone;Moist Heat;Gait training;Stair training;Therapeutic activities;Therapeutic exercise;Patient/family education;Neuromuscular re-education;Manual techniques;Passive range of motion;Dry needling;Taping    PT Next Visit Plan  review exercises; consider SAQ; standing 3 way hip; heel raise; single leg stance; light leg press; consdier taping     PT Home Exercise Plan  quad set; SLR; bridge; hamstring stretch; thomas stretch     Consulted and Agree with Plan of Care  Patient       Patient will benefit from skilled therapeutic intervention  in order to improve the following deficits and impairments:  Pain, Decreased range of motion, Difficulty walking, Decreased activity tolerance  Visit Diagnosis: Chronic pain of left knee  Chronic pain of right knee  Difficulty in walking, not elsewhere classified     Problem List Patient Active Problem List   Diagnosis Date Noted  . Acute and chronic respiratory  failure with hypoxia ([place]) 05/22/2016  . Bronchiectasis without complication ([place]) 63/14/9702  . Elevated IgE level 11/14/2015  . Eosinophilia 11/14/2015  . COPD with acute exacerbation ([place]) 04/19/2015  . Feeling of sadness 04/19/2015    Carney Living PT DPT  04/21/2017, 10:43 AM  [facility] [address] [place], [place], [address] Phone: [phone]   Fax:  [phone]  Name: Patricia Johnston MRN: [id number] Date of Birth: [birth year]

## 2017-04-22 ENCOUNTER — Encounter: Payer: Self-pay | Admitting: Physical Therapy

## 2017-04-22 ENCOUNTER — Ambulatory Visit: Payer: Non-veteran care | Admitting: Physical Therapy

## 2017-04-22 DIAGNOSIS — M25562 Pain in left knee: Secondary | ICD-10-CM | POA: Diagnosis not present

## 2017-04-22 DIAGNOSIS — G8929 Other chronic pain: Secondary | ICD-10-CM

## 2017-04-22 DIAGNOSIS — R262 Difficulty in walking, not elsewhere classified: Secondary | ICD-10-CM

## 2017-04-22 DIAGNOSIS — M25561 Pain in right knee: Secondary | ICD-10-CM

## 2017-04-22 NOTE — Therapy (Signed)
[place], [place], [address] Phone: [phone]   Fax:  [phone]  Physical Therapy Treatment  Patient Details  Name: Patricia Johnston MRN: [id number] Date of Birth: 1950/09/01 Referring Provider: Dr Charlyne Petrin    Encounter Date: 04/22/2017  PT End of Session - 04/22/17 1334    Visit Number  6    Number of Visits  14    Date for PT Re-Evaluation  05/14/17    Authorization Type  1 eval 12 treats and 1 re-eval as needed.     PT Start Time  1329    PT Stop Time  1412    PT Time Calculation (min)  43 min    Activity Tolerance  Patient tolerated treatment well    Behavior During Therapy  WFL for tasks assessed/performed       Past Medical History:  Diagnosis Date  . Asthma   . COPD (chronic obstructive pulmonary disease) ([place])    pt states due to [place] contracted COPD  . Osteoporosis     Past Surgical History:  Procedure Laterality Date  . ABDOMINAL HYSTERECTOMY    . bunionectomy      There were no vitals filed for this visit.  Subjective Assessment - 04/22/17 1332    Subjective  Patient feels like is better then it was last visit. She is having more pain on the left. She feels like it has been running between a 5 -7. Her right knee feels like a muscle strain in the back of the knee.     Limitations  Standing;Walking    How long can you sit comfortably?  Sitting stiffnes up her knee     How long can you walk comfortably?  Pain walking up stairs     Diagnostic tests  Per patientx-ray of the left knee shows arthritis     Currently in Pain?  Yes    Pain Score  5     Pain Location  Knee    Pain Orientation  Left    Pain Type  Chronic pain    Pain Onset  More than a month ago    Pain Frequency  Constant    Aggravating Factors   sitting for too long     Pain Relieving Factors  warming up    Effect of Pain on Daily Activities  pain and stiffness                        [facility] Adult PT Treatment/Exercise - 04/22/17 0001      Knee/Hip Exercises: Stretches   Passive Hamstring Stretch  3 reps;20 seconds      Knee/Hip Exercises: Standing   Heel Raises  2 sets;10 reps    Other Standing Knee Exercises  standing march       Knee/Hip Exercises: Supine   Short Arc Quad Sets  2 sets;10 reps;Limitations    Short Arc Quad Sets Limitations  1lb     Bridges  2 sets;10 reps    Straight Leg Raises  2 sets;10 reps    Straight Leg Raises Limitations  1lb     Other Supine Knee/Hip Exercises  leg press 2x10 20 lb with cuing              PT Education - 04/22/17 1334    Education provided  Yes    Education Details  reviewed technique with exercises     Person(s) Educated  Patient  Methods  Explanation;Demonstration;Tactile cues;Verbal cues    Comprehension  Verbalized understanding;Returned demonstration;Verbal cues required;Tactile cues required;Need further instruction       PT Short Term Goals - 04/21/17 1041      PT SHORT TERM GOAL #1   Title  Patient will dmeostrate full pain free left knee AROM     Baseline  pain with end range     Time  4    Period  Weeks    Status  On-going      PT SHORT TERM GOAL #2   Title  Patient will increase left single leg stance time to 30 seconds without pain     Baseline  working on single leg stance     Time  4    Period  Weeks    Status  On-going      PT SHORT TERM GOAL #3   Title  Patient will increase gross right lower extremity strength to 5/5     Time  4    Period  Weeks    Status  On-going        PT Long Term Goals - 03/19/17 1125      PT LONG TERM GOAL #1   Title  Patient will go up/down 8 steps without increased pain     Time  8    Period  Weeks    Status  New    Target Date  05/14/17      PT LONG TERM GOAL #2   Title  Patient will return to the gym without increased pain with a return to prior level of function such as light jogging     Baseline  unable  to perfrom LE gym activity     Time  8    Period  Weeks    Status  New      PT LONG TERM GOAL #3   Title  Patient will demostrate a 42% limitation on FOTO     Baseline  58% limitation     Time  8    Period  Weeks    Status  New    Target Date  05/14/17            Plan - 04/22/17 1336    Clinical Impression Statement  Patient tolerated treatment well. Therapy able to add leg press back in without increase in pain. Patient requires continued cuing to follow symptoms and not over-do her exercises.     Clinical Presentation  Evolving    Rehab Potential  Good    PT Frequency  2x / week    PT Duration  8 weeks    PT Treatment/Interventions  ADLs/Self Care Home Management;Cryotherapy;Electrical Stimulation;Iontophoresis 4mg /ml Dexamethasone;Moist Heat;Gait training;Stair training;Therapeutic activities;Therapeutic exercise;Patient/family education;Neuromuscular re-education;Manual techniques;Passive range of motion;Dry needling;Taping    PT Next Visit Plan  review exercises; consider SAQ; standing 3 way hip; heel raise; single leg stance; light leg press; consdier taping     PT Home Exercise Plan  quad set; SLR; bridge; hamstring stretch; thomas stretch     Consulted and Agree with Plan of Care  Patient       Patient will benefit from skilled therapeutic intervention in order to improve the following deficits and impairments:  Pain, Decreased range of motion, Difficulty walking, Decreased activity tolerance  Visit Diagnosis: Chronic pain of left knee  Chronic pain of right knee  Difficulty in walking, not elsewhere classified     Problem List Patient Active Problem List   Diagnosis  Date Noted  . Acute and chronic respiratory failure with hypoxia ([place]) 05/22/2016  . Bronchiectasis without complication ([place]) 82/08/154  . Elevated IgE level 11/14/2015  . Eosinophilia 11/14/2015  . COPD with acute exacerbation ([place]) 04/19/2015  . Feeling of sadness 04/19/2015    Carney Living PT DPT  04/22/2017, 2:16 PM  [facility] [address] [place], [place], [address] Phone: [phone]   Fax:  [phone]  Name: Patricia Johnston MRN: [id number] Date of Birth: [birth year]

## 2017-04-27 ENCOUNTER — Ambulatory Visit: Payer: Non-veteran care | Admitting: Physical Therapy

## 2017-04-27 DIAGNOSIS — G8929 Other chronic pain: Secondary | ICD-10-CM

## 2017-04-27 DIAGNOSIS — M25562 Pain in left knee: Principal | ICD-10-CM

## 2017-04-27 DIAGNOSIS — M25561 Pain in right knee: Secondary | ICD-10-CM

## 2017-04-27 DIAGNOSIS — R262 Difficulty in walking, not elsewhere classified: Secondary | ICD-10-CM

## 2017-04-27 NOTE — Therapy (Signed)
[place], [place], [address] Phone: [phone]   Fax:  [phone]  Physical Therapy Treatment  Patient Details  Name: Patricia Johnston MRN: [id number] Date of Birth: 1951/01/03 Referring Provider: Dr Charlyne Petrin    Encounter Date: 04/27/2017  PT End of Session - 04/27/17 1336    Visit Number  7    PT Start Time  6962 Patient requested to leave early 2nd to another appointment.     PT Stop Time  1351    PT Time Calculation (min)  23 min    Activity Tolerance  Patient tolerated treatment well    Behavior During Therapy  WFL for tasks assessed/performed       Past Medical History:  Diagnosis Date  . Asthma   . COPD (chronic obstructive pulmonary disease) ([place])    pt states due to [place] contracted COPD  . Osteoporosis     Past Surgical History:  Procedure Laterality Date  . ABDOMINAL HYSTERECTOMY    . bunionectomy      There were no vitals filed for this visit.  Subjective Assessment - 04/27/17 1332    Subjective  Patient reports her Patricia Johnston been better. She was able to go straight down the stairs and did not have to go donw sideways. She requests a short visit today.     Limitations  Standing;Walking    How long can you sit comfortably?  Sitting stiffnes up her knee     How long can you walk comfortably?  Pain walking up stairs     Diagnostic tests  Per patientx-ray of the left knee shows arthritis     Currently in Pain?  Yes    Pain Score  5  states very light pain     Pain Location  Knee    Pain Orientation  Left;Right    Pain Descriptors / Indicators  Aching    Pain Type  Chronic pain    Pain Onset  More than a month ago    Pain Frequency  Constant    Aggravating Factors   sitting for too long     Pain Relieving Factors  warming up     Effect of Pain on Daily Activities  pain and stiffness                       [facility] Adult PT Treatment/Exercise - 04/27/17  0001      Knee/Hip Exercises: Stretches   Passive Hamstring Stretch  3 reps;20 seconds      Knee/Hip Exercises: Standing   Heel Raises  2 sets;10 reps      Knee/Hip Exercises: Supine   Short Arc Quad Sets  2 sets;10 reps;Limitations    Short Arc Quad Sets Limitations  1lb     Bridges  2 sets;10 reps    Straight Leg Raises  2 sets;10 reps    Straight Leg Raises Limitations  1lb     Other Supine Knee/Hip Exercises  leg press 2x10 20 lb with cuing              PT Education - 04/27/17 1334    Education provided  Yes    Education Details  reviewed technique with ther-ex     Person(s) Educated  Patient    Methods  Explanation;Demonstration;Tactile cues;Verbal cues    Comprehension  Verbalized understanding;Returned demonstration;Verbal cues required;Tactile cues required       PT Short Term Goals - 04/27/17 1344  PT SHORT TERM GOAL #1   Title  Patient will dmeostrate full pain free left knee AROM     Baseline  pain with end range     Time  4    Period  Weeks    Status  On-going      PT SHORT TERM GOAL #2   Title  Patient will increase left single leg stance time to 30 seconds without pain     Baseline  working on single leg stance     Time  4    Period  Weeks    Status  On-going      PT SHORT TERM GOAL #3   Title  Patient will increase gross right lower extremity strength to 5/5     Time  4    Period  Weeks    Status  On-going        PT Long Term Goals - 03/19/17 1125      PT LONG TERM GOAL #1   Title  Patient will go up/down 8 steps without increased pain     Time  8    Period  Weeks    Status  New    Target Date  05/14/17      PT LONG TERM GOAL #2   Title  Patient will return to the gym without increased pain with a return to prior level of function such as light jogging     Baseline  unable to perfrom LE gym activity     Time  8    Period  Weeks    Status  New      PT LONG TERM GOAL #3   Title  Patient will demostrate a 42% limitation on  FOTO     Baseline  58% limitation     Time  8    Period  Weeks    Status  New    Target Date  05/14/17            Plan - 04/27/17 1337    Clinical Impression Statement  Patient had a limited session today per patient request. She tolerated exercises well. She was able to perfrom SLR and SAQ with ankle weight. She had no increase in pain.     Clinical Presentation  Evolving    Clinical Decision Making  Moderate    PT Frequency  2x / week    PT Duration  8 weeks    PT Treatment/Interventions  ADLs/Self Care Home Management;Cryotherapy;Electrical Stimulation;Iontophoresis 4mg /ml Dexamethasone;Moist Heat;Gait training;Stair training;Therapeutic activities;Therapeutic exercise;Patient/family education;Neuromuscular re-education;Manual techniques;Passive range of motion;Dry needling;Taping    PT Next Visit Plan  review exercises; consider SAQ; standing 3 way hip; heel raise; single leg stance; light leg press; consdier taping     PT Home Exercise Plan  quad set; SLR; bridge; hamstring stretch; thomas stretch        Patient will benefit from skilled therapeutic intervention in order to improve the following deficits and impairments:  Pain, Decreased range of motion, Difficulty walking, Decreased activity tolerance  Visit Diagnosis: Chronic pain of left knee  Chronic pain of right knee  Difficulty in walking, not elsewhere classified     Problem List Patient Active Problem List   Diagnosis Date Noted  . Acute and chronic respiratory failure with hypoxia ([place]) 05/22/2016  . Bronchiectasis without complication ([place]) 15/40/0867  . Elevated IgE level 11/14/2015  . Eosinophilia 11/14/2015  . COPD with acute exacerbation ([place]) 04/19/2015  . Feeling of sadness 04/19/2015  Carney Living 04/27/2017, 2:13 PM  [facility] [address] [place], [place], [address] Phone: [phone]   Fax:  [phone]  Name: Patricia Johnston MRN: [id number] Date of Birth: 09/25/1950

## 2017-04-29 ENCOUNTER — Ambulatory Visit: Payer: Non-veteran care | Admitting: Physical Therapy

## 2017-04-29 ENCOUNTER — Encounter: Payer: Self-pay | Admitting: Physical Therapy

## 2017-04-29 DIAGNOSIS — M25561 Pain in right knee: Secondary | ICD-10-CM

## 2017-04-29 DIAGNOSIS — M25562 Pain in left knee: Principal | ICD-10-CM

## 2017-04-29 DIAGNOSIS — G8929 Other chronic pain: Secondary | ICD-10-CM

## 2017-04-29 DIAGNOSIS — R262 Difficulty in walking, not elsewhere classified: Secondary | ICD-10-CM

## 2017-04-29 NOTE — Therapy (Signed)
[place], [place], [address] Phone: [phone]   Fax:  [phone]  Physical Therapy Treatment  Patient Details  Name: Patricia Johnston MRN: [id number] Date of Birth: 30-Jun-1950 Referring Provider: Dr Charlyne Petrin    Encounter Date: 04/29/2017  PT End of Session - 04/29/17 1336    Visit Number  8    Number of Visits  14    Date for PT Re-Evaluation  05/14/17    Authorization Type  1 eval 12 treats and 1 re-eval as needed.     PT Start Time  1330    PT Stop Time  1411    PT Time Calculation (min)  41 min    Activity Tolerance  Patient tolerated treatment well    Behavior During Therapy  WFL for tasks assessed/performed       Past Medical History:  Diagnosis Date  . Asthma   . COPD (chronic obstructive pulmonary disease) ([place])    pt states due to [place] contracted COPD  . Osteoporosis     Past Surgical History:  Procedure Laterality Date  . ABDOMINAL HYSTERECTOMY    . bunionectomy      There were no vitals filed for this visit.  Subjective Assessment - 04/29/17 1334    Subjective  Patient reports her knees have been doing pretty good. She feels like the pain is light.,     Limitations  Standing;Walking    How long can you sit comfortably?  Sitting stiffnes up her knee     How long can you walk comfortably?  Pain walking up stairs     Diagnostic tests  Per patientx-ray of the left knee shows arthritis     Currently in Pain?  Yes    Pain Location  Knee    Pain Orientation  Right;Left    Pain Descriptors / Indicators  Aching    Pain Type  Chronic pain    Pain Onset  More than a month ago    Pain Frequency  Constant    Aggravating Factors   sitting for too long    Pain Relieving Factors  warming up     Effect of Pain on Daily Activities  pain and stiffness                       [facility] Adult PT Treatment/Exercise - 04/29/17 0001      Knee/Hip Exercises: Supine    Short Arc Quad Sets  2 sets;10 reps;Limitations    Short Arc Quad Sets Limitations  2lb    Bridges  2 sets;10 reps    Straight Leg Raises  10 reps    Straight Leg Raises Limitations  2lb     Other Supine Knee/Hip Exercises  leg press 2x10 20 lb with cuing              PT Education - 04/29/17 1335    Education provided  Yes    Education Details  reviewed technique with ther-ex     Person(s) Educated  Patient    Methods  Explanation;Demonstration;Tactile cues;Verbal cues;Handout    Comprehension  Returned demonstration;Verbalized understanding;Verbal cues required;Tactile cues required       PT Short Term Goals - 04/27/17 1344      PT SHORT TERM GOAL #1   Title  Patient will dmeostrate full pain free left knee AROM     Baseline  pain with end range     Time  4    Period  Weeks    Status  On-going      PT SHORT TERM GOAL #2   Title  Patient will increase left single leg stance time to 30 seconds without pain     Baseline  working on single leg stance     Time  4    Period  Weeks    Status  On-going      PT SHORT TERM GOAL #3   Title  Patient will increase gross right lower extremity strength to 5/5     Time  4    Period  Weeks    Status  On-going        PT Long Term Goals - 03/19/17 1125      PT LONG TERM GOAL #1   Title  Patient will go up/down 8 steps without increased pain     Time  8    Period  Weeks    Status  New    Target Date  05/14/17      PT LONG TERM GOAL #2   Title  Patient will return to the gym without increased pain with a return to prior level of function such as light jogging     Baseline  unable to perfrom LE gym activity     Time  8    Period  Weeks    Status  New      PT LONG TERM GOAL #3   Title  Patient will demostrate a 42% limitation on FOTO     Baseline  58% limitation     Time  8    Period  Weeks    Status  New    Target Date  05/14/17            Plan - 04/29/17 1337    Clinical Impression Statement  Therapy added  back in steps . She had much bettewr control when going up and down the steps. She also had better control with the leg press. Her pain is improving. Therapy advanced her weights with SLR and SAQ.     Clinical Presentation  Evolving    Clinical Decision Making  Moderate    Rehab Potential  Good    PT Frequency  2x / week    PT Duration  8 weeks    PT Treatment/Interventions  ADLs/Self Care Home Management;Cryotherapy;Electrical Stimulation;Iontophoresis 4mg /ml Dexamethasone;Moist Heat;Gait training;Stair training;Therapeutic activities;Therapeutic exercise;Patient/family education;Neuromuscular re-education;Manual techniques;Passive range of motion;Dry needling;Taping    PT Next Visit Plan  review exercises; consider SAQ; standing 3 way hip; heel raise; single leg stance; light leg press; consdier taping     PT Home Exercise Plan  quad set; SLR; bridge; hamstring stretch; thomas stretch     Consulted and Agree with Plan of Care  Patient       Patient will benefit from skilled therapeutic intervention in order to improve the following deficits and impairments:  Pain, Decreased range of motion, Difficulty walking, Decreased activity tolerance  Visit Diagnosis: Chronic pain of left knee  Chronic pain of right knee  Difficulty in walking, not elsewhere classified     Problem List Patient Active Problem List   Diagnosis Date Noted  . Acute and chronic respiratory failure with hypoxia ([place]) 05/22/2016  . Bronchiectasis without complication ([place]) 39/76/7341  . Elevated IgE level 11/14/2015  . Eosinophilia 11/14/2015  . COPD with acute exacerbation ([place]) 04/19/2015  . Feeling of sadness 04/19/2015    Carney Living PT DPT  04/29/2017, 2:43 PM  [facility] [address] [place], [place], [address] Phone: [phone]   Fax:  [phone]  Name: Patricia Johnston MRN: [id number] Date of Birth: January 15, 1951

## 2017-05-04 ENCOUNTER — Ambulatory Visit: Payer: Non-veteran care | Attending: Infectious Diseases | Admitting: Physical Therapy

## 2017-05-04 ENCOUNTER — Encounter: Payer: Self-pay | Admitting: Physical Therapy

## 2017-05-04 DIAGNOSIS — R262 Difficulty in walking, not elsewhere classified: Secondary | ICD-10-CM | POA: Insufficient documentation

## 2017-05-04 DIAGNOSIS — M25561 Pain in right knee: Secondary | ICD-10-CM | POA: Insufficient documentation

## 2017-05-04 DIAGNOSIS — M25562 Pain in left knee: Secondary | ICD-10-CM | POA: Insufficient documentation

## 2017-05-04 DIAGNOSIS — G8929 Other chronic pain: Secondary | ICD-10-CM | POA: Insufficient documentation

## 2017-05-04 NOTE — Therapy (Signed)
[place], [place], [address] Phone: [phone]   Fax:  [phone]  Physical Therapy Treatment  Patient Details  Name: Patricia Johnston MRN: [id number] Date of Birth: February 13, 1951 Referring Provider: Dr Charlyne Petrin    Encounter Date: 05/04/2017  PT End of Session - 05/04/17 1159    Visit Number  9    Number of Visits  14    Date for PT Re-Evaluation  05/14/17    Authorization Type  1 eval 12 treats and 1 re-eval as needed.     PT Start Time  1154    PT Stop Time  1233    PT Time Calculation (min)  39 min    Activity Tolerance  Patient tolerated treatment well    Behavior During Therapy  WFL for tasks assessed/performed       Past Medical History:  Diagnosis Date  . Asthma   . COPD (chronic obstructive pulmonary disease) ([place])    pt states due to [place] contracted COPD  . Osteoporosis     Past Surgical History:  Procedure Laterality Date  . ABDOMINAL HYSTERECTOMY    . bunionectomy      There were no vitals filed for this visit.  Subjective Assessment - 05/04/17 1158    Subjective  Patient reports her knees responed well after the last visit. She is having very little pain today.     Limitations  Standing;Walking    How long can you sit comfortably?  Sitting stiffnes up her knee     How long can you walk comfortably?  Pain walking up stairs     Diagnostic tests  Per patientx-ray of the left knee shows arthritis     Currently in Pain?  Yes    Pain Score  2     Pain Location  Knee    Pain Orientation  Right;Left    Pain Descriptors / Indicators  Aching    Pain Type  Chronic pain    Pain Onset  More than a month ago    Pain Frequency  Constant    Aggravating Factors   sitting for too long     Pain Relieving Factors  warming up     Effect of Pain on Daily Activities  pain and stiffness     Multiple Pain Sites  No    Pain Location  Knee    Pain Orientation  Right    Pain  Descriptors / Indicators  Aching    Pain Type  Acute pain    Pain Onset  More than a month ago    Pain Frequency  Constant    Aggravating Factors   standing and sitting for too long     Pain Relieving Factors  warming up     Effect of Pain on Daily Activities  pain and stiffness                       [facility] Adult PT Treatment/Exercise - 05/04/17 0001      Knee/Hip Exercises: Stretches   Passive Hamstring Stretch  3 reps;20 seconds      Knee/Hip Exercises: Standing   Heel Raises  2 sets;10 reps      Knee/Hip Exercises: Supine   Short Arc Quad Sets  2 sets;10 reps;Limitations    Short Arc Quad Sets Limitations  2lb     Bridges  2 sets;10 reps    Straight Leg Raises  10 reps  Straight Leg Raises Limitations  2lb     Other Supine Knee/Hip Exercises  leg press 2x10 20 lb with cuing              PT Education - 05/04/17 1537    Education provided  Yes    Education Details  reviewed exercises     Person(s) Educated  Patient    Methods  Explanation;Demonstration;Verbal cues;Tactile cues    Comprehension  Verbalized understanding;Returned demonstration;Verbal cues required       PT Short Term Goals - 04/27/17 1344      PT SHORT TERM GOAL #1   Title  Patient will dmeostrate full pain free left knee AROM     Baseline  pain with end range     Time  4    Period  Weeks    Status  On-going      PT SHORT TERM GOAL #2   Title  Patient will increase left single leg stance time to 30 seconds without pain     Baseline  working on single leg stance     Time  4    Period  Weeks    Status  On-going      PT SHORT TERM GOAL #3   Title  Patient will increase gross right lower extremity strength to 5/5     Time  4    Period  Weeks    Status  On-going        PT Long Term Goals - 03/19/17 1125      PT LONG TERM GOAL #1   Title  Patient will go up/down 8 steps without increased pain     Time  8    Period  Weeks    Status  New    Target Date  05/14/17       PT LONG TERM GOAL #2   Title  Patient will return to the gym without increased pain with a return to prior level of function such as light jogging     Baseline  unable to perfrom LE gym activity     Time  8    Period  Weeks    Status  New      PT LONG TERM GOAL #3   Title  Patient will demostrate a 42% limitation on FOTO     Baseline  58% limitation     Time  8    Period  Weeks    Status  New    Target Date  05/14/17            Plan - 05/04/17 1226    Clinical Impression Statement  Patient continues to make good progress. She had no pain with increased weights. She was advised to continue her HEP.     Clinical Presentation  Evolving    Clinical Decision Making  Moderate    Rehab Potential  Good    PT Frequency  2x / week    PT Duration  8 weeks    PT Treatment/Interventions  ADLs/Self Care Home Management;Cryotherapy;Electrical Stimulation;Iontophoresis 4mg /ml Dexamethasone;Moist Heat;Gait training;Stair training;Therapeutic activities;Therapeutic exercise;Patient/family education;Neuromuscular re-education;Manual techniques;Passive range of motion;Dry needling;Taping    PT Next Visit Plan  review exercises; consider SAQ; standing 3 way hip; heel raise; single leg stance; light leg press; consdier taping     PT Home Exercise Plan  quad set; SLR; bridge; hamstring stretch; thomas stretch     Consulted and Agree with Plan of Care  Patient  Patient will benefit from skilled therapeutic intervention in order to improve the following deficits and impairments:  Pain, Decreased range of motion, Difficulty walking, Decreased activity tolerance  Visit Diagnosis: Chronic pain of left knee  Chronic pain of right knee  Difficulty in walking, not elsewhere classified     Problem List Patient Active Problem List   Diagnosis Date Noted  . Acute and chronic respiratory failure with hypoxia ([place]) 05/22/2016  . Bronchiectasis without complication ([place]) 70/03/7492  . Elevated  IgE level 11/14/2015  . Eosinophilia 11/14/2015  . COPD with acute exacerbation ([place]) 04/19/2015  . Feeling of sadness 04/19/2015    Carney Living PT DPT  05/04/2017, 3:39 PM  [facility] [address] [place], [place], [address] Phone: [phone]   Fax:  [phone]  Name: Patricia Johnston MRN: [id number] Date of Birth: March 17, 1950

## 2017-05-06 ENCOUNTER — Encounter: Payer: Non-veteran care | Admitting: Physical Therapy

## 2017-05-11 ENCOUNTER — Ambulatory Visit: Payer: Non-veteran care | Admitting: Physical Therapy

## 2017-05-18 ENCOUNTER — Ambulatory Visit: Payer: Non-veteran care | Admitting: Physical Therapy

## 2017-05-18 DIAGNOSIS — M25561 Pain in right knee: Secondary | ICD-10-CM

## 2017-05-18 DIAGNOSIS — G8929 Other chronic pain: Secondary | ICD-10-CM

## 2017-05-18 DIAGNOSIS — M25562 Pain in left knee: Principal | ICD-10-CM

## 2017-05-18 DIAGNOSIS — R262 Difficulty in walking, not elsewhere classified: Secondary | ICD-10-CM

## 2017-05-18 NOTE — Therapy (Addendum)
[place], [place], [address] Phone: [phone]   Fax:  [phone]  Physical Therapy Treatment/ Discharge   Patient Details  Name: Patricia Johnston MRN: [id number] Date of Birth: 1950/04/14 Referring Provider: Dr Charlyne Petrin    Encounter Date: 05/18/2017  PT End of Session - 05/18/17 1550    Visit Number  10    Number of Visits  14    Date for PT Re-Evaluation  05/14/17    Authorization Type  1 eval 12 treats and 1 re-eval as needed.     PT Start Time  1545    PT Stop Time  1625    PT Time Calculation (min)  40 min    Activity Tolerance  Patient tolerated treatment well    Behavior During Therapy  WFL for tasks assessed/performed       Past Medical History:  Diagnosis Date  . Asthma   . COPD (chronic obstructive pulmonary disease) ([place])    pt states due to [place] contracted COPD  . Osteoporosis     Past Surgical History:  Procedure Laterality Date  . ABDOMINAL HYSTERECTOMY    . bunionectomy      There were no vitals filed for this visit.  Subjective Assessment - 05/18/17 1611    Subjective  Patient reports she is having no knee pain. Patient reports she feels she is ready to be discharged from PT. Pt has been successfully completing her exercises and is progressing well.     Currently in Pain?  No/denies         [facility] PT Assessment - 05/19/17 0001      Strength   Right Hip Flexion  5/5    Right Hip ABduction  5/5    Right Hip ADduction  5/5    Left Hip Flexion  5/5    Left Hip ABduction  5/5    Left Hip ADduction  5/5    Right Knee Flexion  5/5    Right Knee Extension  5/5    Left Knee Flexion  5/5    Left Knee Extension  5/5                  [facility] Adult PT Treatment/Exercise - 05/19/17 0001      Knee/Hip Exercises: Standing   Heel Raises  2 sets;10 reps    Hip Abduction  10 reps;1 set    Hip Extension  10 reps;1 set    Other Standing Knee Exercises   Standing march; 2x10      Knee/Hip Exercises: Supine   Quad Sets  2 sets;10 reps    Bridges  2 sets;10 reps    Straight Leg Raises  10 reps    Other Supine Knee/Hip Exercises  leg press 2x10  25lb with cueing    Other Supine Knee/Hip Exercises  clamshells; blue; 2x10             PT Education - 05/18/17 1619    Education provided  Yes    Education Details  exercise techniques; symptom management for future    Person(s) Educated  Patient    Methods  Explanation;Demonstration;Tactile cues;Verbal cues    Comprehension  Verbalized understanding;Returned demonstration       PT Short Term Goals - 05/19/17 1314      PT SHORT TERM GOAL #1   Title  Patient will dmeostrate full pain free left knee AROM     Baseline  pain with end range  Time  4    Period  Weeks    Status  Achieved      PT SHORT TERM GOAL #2   Title  Patient will increase left single leg stance time to 30 seconds without pain     Baseline  able to maintain for 20 sec without pain     Time  4    Period  Weeks    Status  Achieved      PT SHORT TERM GOAL #3   Title  Patient will increase gross right lower extremity strength to 5/5     Baseline  5/5     Time  4    Period  Weeks    Status  Achieved        PT Long Term Goals - 05/19/17 1316      PT LONG TERM GOAL #1   Title  Patient will go up/down 8 steps without increased pain     Baseline  able to go up and down the stairs     Time  8    Period  Weeks    Status  Achieved      PT LONG TERM GOAL #2   Title  Patient will return to the gym without increased pain with a return to prior level of function such as light jogging     Baseline  unable to perfrom LE gym activity     Time  8    Period  Weeks    Status  Achieved      PT LONG TERM GOAL #3   Title  Patient will demostrate a 42% limitation on FOTO     Baseline  58% limitation     Time  8    Period  Weeks    Status  Achieved         PHYSICAL THERAPY DISCHARGE SUMMARY  Visits from  Start of Care: 10  Current functional level related to goals / functional outcomes: Improved pain with all functional activity    Remaining deficits: Pain at times when walking    Education / Equipment: HEP  Plan: Patient agrees to discharge.  Patient goals were met. Patient is being discharged due to meeting the stated rehab goals.  ?????        Plan - 05/19/17 1313    Clinical Impression Statement  Patient has reached all goals for PT. She continues to have minor pain at times but she has a good plan to manage her pain. She was encoraged to continue with her home exercixses     Clinical Presentation  Evolving    Clinical Decision Making  Moderate    Rehab Potential  Good    PT Frequency  2x / week    PT Duration  8 weeks    PT Treatment/Interventions  ADLs/Self Care Home Management;Cryotherapy;Electrical Stimulation;Iontophoresis 62m/ml Dexamethasone;Moist Heat;Gait training;Stair training;Therapeutic activities;Therapeutic exercise;Patient/family education;Neuromuscular re-education;Manual techniques;Passive range of motion;Dry needling;Taping    PT Next Visit Plan  review exercises; consider SAQ; standing 3 way hip; heel raise; single leg stance; light leg press; consdier taping     PT Home Exercise Plan  quad set; SLR; bridge; hamstring stretch; thomas stretch     Consulted and Agree with Plan of Care  Patient       Patient will benefit from skilled therapeutic intervention in order to improve the following deficits and impairments:  Pain, Decreased range of motion, Difficulty walking, Decreased activity tolerance  Visit Diagnosis: Chronic pain of left  knee  Chronic pain of right knee  Difficulty in walking, not elsewhere classified     Problem List Patient Active Problem List   Diagnosis Date Noted  . Acute and chronic respiratory failure with hypoxia ([place]) 05/22/2016  . Bronchiectasis without complication ([place]) 03/47/4259  . Elevated IgE level 11/14/2015  .  Eosinophilia 11/14/2015  . COPD with acute exacerbation ([place]) 04/19/2015  . Feeling of sadness 04/19/2015    Carney Living  PT DPT  05/19/2017, 1:18 PM  [facility] [address] [place], [place], [address] Phone: [phone]   Fax:  [phone]  Name: Patricia Johnston MRN: [id number] Date of Birth: [birth year]

## 2017-05-20 ENCOUNTER — Encounter: Payer: Non-veteran care | Admitting: Physical Therapy

## 2017-06-01 ENCOUNTER — Encounter: Payer: Non-veteran care | Admitting: Physical Therapy

## 2017-06-16 ENCOUNTER — Emergency Department (HOSPITAL_COMMUNITY)
Admission: EM | Admit: 2017-06-16 | Discharge: 2017-06-16 | Disposition: A | Discharge status: Home / Self Care | Payer: Medicare Other | Attending: Emergency Medicine | Admitting: Emergency Medicine

## 2017-06-16 ENCOUNTER — Other Ambulatory Visit: Payer: Self-pay

## 2017-06-16 DIAGNOSIS — I959 Hypotension, unspecified: Secondary | ICD-10-CM | POA: Diagnosis not present

## 2017-06-16 DIAGNOSIS — J449 Chronic obstructive pulmonary disease, unspecified: Secondary | ICD-10-CM | POA: Insufficient documentation

## 2017-06-16 DIAGNOSIS — Z79899 Other long term (current) drug therapy: Secondary | ICD-10-CM | POA: Diagnosis not present

## 2017-06-16 DIAGNOSIS — Z23 Encounter for immunization: Secondary | ICD-10-CM | POA: Diagnosis not present

## 2017-06-16 DIAGNOSIS — R55 Syncope and collapse: Secondary | ICD-10-CM | POA: Diagnosis not present

## 2017-06-16 DIAGNOSIS — R404 Transient alteration of awareness: Secondary | ICD-10-CM | POA: Diagnosis not present

## 2017-06-16 DIAGNOSIS — S81812A Laceration without foreign body, left lower leg, initial encounter: Secondary | ICD-10-CM | POA: Diagnosis not present

## 2017-06-16 LAB — CBG MONITORING, ED: Glucose-Capillary: 99 mg/dL (ref 65–99)

## 2017-06-16 LAB — BASIC METABOLIC PANEL
Anion gap: 8 (ref 5–15)
BUN: 9 mg/dL (ref 6–20)
CO2: 24 mmol/L (ref 22–32)
CREATININE: 1 mg/dL (ref 0.44–1.00)
Calcium: 8.6 mg/dL — ABNORMAL LOW (ref 8.9–10.3)
Chloride: 111 mmol/L (ref 101–111)
GFR calc Af Amer: >60 mL/min (ref >60)
GFR, EST NON AFRICAN AMERICAN: 57 mL/min — AB (ref >60)
Glucose, Bld: 106 mg/dL — ABNORMAL HIGH (ref 65–99)
Potassium: 4.1 mmol/L (ref 3.5–5.1)
SODIUM: 143 mmol/L (ref 135–145)

## 2017-06-16 LAB — CBC
HCT: 38.9 % (ref 36.0–46.0)
Hemoglobin: 13.1 g/dL (ref 12.0–15.0)
MCH: 31.5 pg (ref 26.0–34.0)
MCHC: 33.7 g/dL (ref 30.0–36.0)
MCV: 93.5 fL (ref 78.0–100.0)
PLATELETS: 173 10*3/uL (ref 150–400)
RBC: 4.16 MIL/uL (ref 3.87–5.11)
RDW: 12.5 % (ref 11.5–15.5)
WBC: 5.2 10*3/uL (ref 4.0–10.5)

## 2017-06-16 MED ORDER — TETANUS-DIPHTH-ACELL PERTUSSIS 5-2.5-18.5 LF-MCG/0.5 IM SUSP
0.5000 mL | Freq: Once | INTRAMUSCULAR | Status: AC
  Administered 2017-06-16: 0.5 mL via INTRAMUSCULAR
  Filled 2017-06-16: qty 0.5

## 2017-06-16 NOTE — ED Notes (Signed)
Pt verbalizes understanding of d/c instructions. Pt ambulatory at d/c with all belongings.   

## 2017-06-16 NOTE — ED Provider Notes (Signed)
[facility] EMERGENCY DEPARTMENT Provider Note   CSN: [id number] Arrival date & time: 06/16/17  1652     History   Chief Complaint Chief Complaint  Patient presents with  . Near Syncope    HPI Patricia Johnston is a 67 y.o. female.  HPI  Ms. Patricia Johnston is a 67 yo female with a history of COPD (not on home O2) who presents to the emergency department from the urgent care office for evaluation of presyncope.  Patient states that she was getting stitches in her left lower leg earlier today at the urgent care. States that seeing blood makes her quesy. When they were finished she looked down at her leg and suddenly felt clammy, lightheaded and nauseous.  She did not lose consciousness, but felt "out of it."  States that her speech was somewhat slurred for a brief period of time as well.  According to EMS her blood pressure was 60/40 on arrival.  She received 1.2 L of normal saline on the way to the emergency department and patient reports subsequent improvement.  Patient states that she is back to baseline.  She denies fever, chills, focal numbness or weakness, vision changes, chest pain, palpitations, shortness of breath, cough abdominal pain, vomiting, diarrhea.  Patient reports that she was outside in the hot sun today and only had a cup of coffee to drink this morning.  She cut her leg on a piece of metal as she was helping 1 of her friends that was taking down a shed.  Reports that she needs her tetanus shot updated as it has been 10 years since her last.  Past Medical History:  Diagnosis Date  . Asthma   . COPD (chronic obstructive pulmonary disease) ([place])    pt states due to [place] contracted COPD  . Osteoporosis     Patient Active Problem List   Diagnosis Date Noted  . Acute and chronic respiratory failure with hypoxia ([place]) 05/22/2016  . Bronchiectasis without complication ([place]) 53/61/4431  . Elevated IgE level 11/14/2015  . Eosinophilia 11/14/2015  .  COPD with acute exacerbation ([place]) 04/19/2015  . Feeling of sadness 04/19/2015    Past Surgical History:  Procedure Laterality Date  . ABDOMINAL HYSTERECTOMY    . bunionectomy       OB History   None      Home Medications    Prior to Admission medications   Medication Sig Start Date End Date Taking? Authorizing Provider  albuterol (PROVENTIL HFA;VENTOLIN HFA) 108 (90 Base) MCG/ACT inhaler Inhale 1-2 puffs into the lungs every 6 (six) hours as needed for wheezing or shortness of breath.    [provider]  Dextromethorphan-Guaifenesin 5-100 MG/5ML LIQD Take 5 mLs by mouth every 4 (four) hours as needed (cough).     [provider]  diphenhydrAMINE (BENADRYL) 25 MG tablet Take 25 mg by mouth every 6 (six) hours as needed (anxiety).    [provider]  feeding supplement, ENSURE ENLIVE, (ENSURE ENLIVE) LIQD Take 237 mLs by mouth 2 (two) times daily between meals. 05/26/16   Florencia Reasons, MD  guaiFENesin (MUCINEX) 600 MG 12 hr tablet Take 1 tablet (600 mg total) by mouth 2 (two) times daily. 05/26/16   Florencia Reasons, MD  ipratropium-albuterol (DUONEB) 0.5-2.5 (3) MG/3ML SOLN Take 3 mLs by nebulization every 6 (six) hours as needed (sob and wheezing).     [provider]  mometasone [facility]) 220 MCG/INH inhaler Inhale 2 puffs into the lungs daily.  [provider]  OXYGEN Inhale 2 L into the lungs daily as needed (for exertion). Reported on 04/19/2015    [provider]  predniSONE (DELTASONE) 10 MG tablet Take 30mg  on day one, 20mg  on day tow, 10mg  on day three, then stop. 05/26/16   Florencia Reasons, MD  PredniSONE 2 MG TBEC Take 2 tablets by mouth daily as needed (sob).    [provider]  saccharomyces boulardii (FLORASTOR) 250 MG capsule Take 1 capsule (250 mg total) by mouth 2 (two) times daily. 05/26/16   Florencia Reasons, MD  Tiotropium Bromide-Olodaterol (STIOLTO RESPIMAT) 2.5-2.5 MCG/ACT AERS Inhale 2 puffs into the lungs daily.    [provider]    Family History No family history on file.  Social History Social History   Tobacco Use  . Smoking status: Never Smoker  . Smokeless tobacco: Never Used  Substance Use Topics  . Alcohol use: No    Alcohol/week: 0.0 oz  . Drug use: No     Allergies   Flonase [fluticasone propionate] and Nasacort [triamcinolone]   Review of Systems Review of Systems  Constitutional: Negative for chills and fever.  HENT: Negative for congestion and sore throat.   Eyes: Negative for visual disturbance.  Respiratory: Negative for cough, shortness of breath and wheezing.   Cardiovascular: Negative for chest pain, palpitations and leg swelling.  Gastrointestinal: Negative for abdominal pain, nausea and vomiting.  Genitourinary: Negative for difficulty urinating and dysuria.  Musculoskeletal: Negative for gait problem.  Skin: Positive for wound (left lower leg laceration, three stitches in place). Negative for rash.  Neurological: Positive for light-headedness. Negative for weakness, numbness and headaches.  Psychiatric/Behavioral: Negative for agitation.     Physical Exam Updated Vital Signs BP 118/67   Pulse 68   Temp (!) 97.5 F (36.4 C) (Oral)   Resp 16   Ht 5\' 4"  (1.626 m)   Wt 58.1 kg (128 lb)   SpO2 96%   BMI 21.97 kg/m   Physical Exam  Constitutional: She is oriented to person, place, and time. She appears well-developed and well-nourished. No distress.  Sitting at bedside in no apparent distress. Non-toxic.   HENT:  Head: Normocephalic and atraumatic.  Mouth/Throat: Oropharynx is clear and moist. No oropharyngeal exudate.  Mucous membranes moist.   Eyes: Pupils are equal, round, and reactive to light. Conjunctivae are normal. Right eye exhibits no discharge. Left eye exhibits no discharge.  Neck: Normal range of motion. Neck supple. No JVD present. No tracheal deviation present.  Cardiovascular: Normal rate, regular rhythm and intact distal pulses. Exam  reveals no friction rub.  No murmur heard. Pulmonary/Chest: Effort normal. No stridor. No respiratory distress. She has no wheezes. She has no rales.  Abdominal: Soft. Bowel sounds are normal. There is no tenderness. There is no guarding.  Musculoskeletal:  No leg swelling.  Neurological: She is alert and oriented to person, place, and time. Coordination normal.  Mental Status:  Alert, oriented, thought content appropriate, able to give a coherent history. Speech fluent without evidence of aphasia. Able to follow 2 step commands without difficulty.  Cranial Nerves:  II:  Peripheral visual fields grossly normal, pupils equal, round, reactive to light III,IV, VI: ptosis not present, extra-ocular motions intact bilaterally  V,VII: smile symmetric, facial light touch sensation equal VIII: hearing grossly normal to voice  X: uvula elevates symmetrically  XI: bilateral shoulder shrug symmetric and strong XII: midline tongue extension without fassiculations Motor:  Normal tone. 5/5 in upper and lower  extremities bilaterally including strong and equal grip strength and dorsiflexion/plantar flexion Sensory: Pinprick and light touch normal in all extremities.  Deep Tendon Reflexes: 2+ and symmetric in the biceps and patella Cerebellar: normal finger-to-nose with bilateral upper extremities Gait: normal gait and balance   Skin: Skin is warm and dry. She is not diaphoretic.  Approximately 3 cm laceration on the left anterior shin with 3 simple interrupted stitches in place.  No surrounding erythema, warmth or tenderness.  Psychiatric: She has a normal mood and affect. Her behavior is normal.  Nursing note and vitals reviewed.    ED Treatments / Results  Labs (all labs ordered are listed, but only abnormal results are displayed) Labs Reviewed  BASIC METABOLIC PANEL - Abnormal; Notable for the following components:      Result Value   Glucose, Bld 106 (*)    Calcium 8.6 (*)    GFR calc non  Af Amer 57 (*)    All other components within normal limits  CBC  CBG MONITORING, ED    EKG EKG Interpretation  Date/Time:  Wednesday June 16 2017 17:04:20 EDT Ventricular Rate:  65 PR Interval:    QRS Duration: 85 QT Interval:  442 QTC Calculation: 460 R Axis:   84 Text Interpretation:  Sinus rhythm Ventricular premature complex Borderline right axis deviation Confirmed by Nat Christen [phone]) on 06/16/2017 7:09:27 PM   Radiology No results found.  Procedures Procedures (including critical care time)  Medications Ordered in ED Medications  Tdap (BOOSTRIX) injection 0.5 mL (0.5 mLs Intramuscular Given 06/16/17 1826)     Initial Impression / Assessment and Plan / ED Course  I have reviewed the triage vital signs and the nursing notes.  Pertinent labs & imaging results that were available during my care of the patient were reviewed by me and considered in my medical decision making (see chart for details).     Presents after a presyncopal episode earlier today.  Feel that this was likely vasovagal given she reports feeling queasy upon looking at the wound on the leg and felt lightheaded.  Denies chest pain, palpitations, shortness of breath, focal numbness or weakness.   Received 1.2L fluid bolus by EMS, states she feels baseline. On exam she is afebrile and non-toxic appearing. Blood pressure 118/67. A&Ox3. No neurological deficits. Able to ambulate independently.   Labwork reviewed. CBC WNL. CBG normal. BMP without any major electrolyte abnormalities. EKG NSR, she has one PVC.   Patient without arrhythmia or tachycardia while here in the department.  Patient without history of congestive heart failure, normal hematocrit, normal ECG, no shortness of breath and systolic blood pressure greater than 90. Pt has remained hemodynamically stable throughout their time in the ED. Plan to discharge patient with instructions to stay well hydrated at home. Wound on her leg was closed by  UC. No signs of infection. Counseled her that she will need to have stitches removed in 10d. Tdap updated in the ER. Counseled her on return precautions and she agrees and voices understanding. She has no complaints prior to discharge. This was a shared visit with Dr. Lacinda Axon who agrees with plan and discharge home.    Final Clinical Impressions(s) / ED Diagnoses   Final diagnoses:  Near syncope    ED Discharge Orders    None       Bernarda Caffey 06/16/17 2220    Nat Christen, MD 06/17/17 [phone]

## 2017-06-16 NOTE — ED Notes (Signed)
Pt ambulated in hallway with no distress, no dizziness.

## 2017-06-16 NOTE — ED Triage Notes (Signed)
Pt BIB EMS from urgent care office for tx of cut on her leg to be sutured. Pt had syncopal episode at urgent care office. According to PA at urgent care this pt did not fully lose consciousness. EMS reports BP @urgent  care was 60/40 with a CAP refill >5. CBG: 106.  EMS gave pt 1241mL NS. Pt states she was working outside all morning before going to UC.

## 2017-06-16 NOTE — Discharge Instructions (Addendum)
Your blood work and EKG were reassuring.  Your tetanus shot was updated here.  Please have your stitches removed in 10 days.  Keep clean with warm soapy water as we discussed.  Return to the emergency department for any new or concerning symptoms like fever, redness or tenderness over the wound.

## 2017-06-29 DIAGNOSIS — S81812D Laceration without foreign body, left lower leg, subsequent encounter: Secondary | ICD-10-CM | POA: Diagnosis not present

## 2017-07-06 DIAGNOSIS — Z4802 Encounter for removal of sutures: Secondary | ICD-10-CM | POA: Diagnosis not present

## 2017-09-01 DIAGNOSIS — M129 Arthropathy, unspecified: Secondary | ICD-10-CM | POA: Diagnosis not present

## 2017-09-01 DIAGNOSIS — M25511 Pain in right shoulder: Secondary | ICD-10-CM | POA: Diagnosis not present

## 2017-11-29 DIAGNOSIS — Z23 Encounter for immunization: Secondary | ICD-10-CM | POA: Diagnosis not present

## 2018-04-06 DIAGNOSIS — H6121 Impacted cerumen, right ear: Secondary | ICD-10-CM | POA: Diagnosis not present

## 2018-04-06 DIAGNOSIS — J4541 Moderate persistent asthma with (acute) exacerbation: Secondary | ICD-10-CM | POA: Diagnosis not present

## 2018-04-06 DIAGNOSIS — L299 Pruritus, unspecified: Secondary | ICD-10-CM | POA: Diagnosis not present

## 2018-06-25 ENCOUNTER — Other Ambulatory Visit: Payer: Self-pay

## 2018-06-25 ENCOUNTER — Emergency Department (HOSPITAL_COMMUNITY): Payer: Medicare Other

## 2018-06-25 ENCOUNTER — Emergency Department (HOSPITAL_COMMUNITY)
Admission: EM | Admit: 2018-06-25 | Discharge: 2018-06-25 | Disposition: A | Discharge status: Home / Self Care | Payer: Medicare Other | Attending: Emergency Medicine | Admitting: Emergency Medicine

## 2018-06-25 DIAGNOSIS — J441 Chronic obstructive pulmonary disease with (acute) exacerbation: Secondary | ICD-10-CM | POA: Diagnosis not present

## 2018-06-25 DIAGNOSIS — R05 Cough: Secondary | ICD-10-CM | POA: Diagnosis not present

## 2018-06-25 DIAGNOSIS — R0602 Shortness of breath: Secondary | ICD-10-CM | POA: Diagnosis not present

## 2018-06-25 DIAGNOSIS — J45909 Unspecified asthma, uncomplicated: Secondary | ICD-10-CM | POA: Diagnosis not present

## 2018-06-25 DIAGNOSIS — Z79899 Other long term (current) drug therapy: Secondary | ICD-10-CM | POA: Diagnosis not present

## 2018-06-25 LAB — BASIC METABOLIC PANEL
Anion gap: 11 (ref 5–15)
BUN: 8 mg/dL (ref 8–23)
CO2: 24 mmol/L (ref 22–32)
Calcium: 9.5 mg/dL (ref 8.9–10.3)
Chloride: 106 mmol/L (ref 98–111)
Creatinine, Ser: 0.88 mg/dL (ref 0.44–1.00)
GFR calc Af Amer: >60 mL/min (ref >60)
GFR calc non Af Amer: >60 mL/min (ref >60)
Glucose, Bld: 79 mg/dL (ref 70–99)
Potassium: 3.6 mmol/L (ref 3.5–5.1)
Sodium: 141 mmol/L (ref 135–145)

## 2018-06-25 LAB — CBC
HCT: 41.2 % (ref 36.0–46.0)
Hemoglobin: 13.8 g/dL (ref 12.0–15.0)
MCH: 31.7 pg (ref 26.0–34.0)
MCHC: 33.5 g/dL (ref 30.0–36.0)
MCV: 94.5 fL (ref 80.0–100.0)
Platelets: 198 10*3/uL (ref 150–400)
RBC: 4.36 MIL/uL (ref 3.87–5.11)
RDW: 11.9 % (ref 11.5–15.5)
WBC: 5.6 10*3/uL (ref 4.0–10.5)
nRBC: 0.4 % — ABNORMAL HIGH (ref 0.0–0.2)

## 2018-06-25 LAB — I-STAT TROPONIN, ED: Troponin i, poc: 0.01 ng/mL (ref 0.00–0.08)

## 2018-06-25 MED ORDER — IPRATROPIUM BROMIDE HFA 17 MCG/ACT IN AERS
2.0000 | INHALATION_SPRAY | Freq: Once | RESPIRATORY_TRACT | Status: AC
  Administered 2018-06-25: 21:00:00 2 via RESPIRATORY_TRACT
  Filled 2018-06-25: qty 12.9

## 2018-06-25 MED ORDER — ALBUTEROL SULFATE HFA 108 (90 BASE) MCG/ACT IN AERS
4.0000 | INHALATION_SPRAY | Freq: Once | RESPIRATORY_TRACT | Status: AC
  Administered 2018-06-25: 21:00:00 4 via RESPIRATORY_TRACT
  Filled 2018-06-25: qty 6.7

## 2018-06-25 MED ORDER — ALBUTEROL SULFATE HFA 108 (90 BASE) MCG/ACT IN AERS: 2.0000 | INHALATION_SPRAY | RESPIRATORY_TRACT | 1 refills | Status: AC | PRN

## 2018-06-25 MED ORDER — PREDNISONE 20 MG PO TABS
60.0000 mg | ORAL_TABLET | Freq: Once | ORAL | Status: AC
  Administered 2018-06-25: 60 mg via ORAL
  Filled 2018-06-25: qty 3

## 2018-06-25 MED ORDER — PREDNISONE 20 MG PO TABS: 60.0000 mg | ORAL_TABLET | Freq: Every day | ORAL | 0 refills | Status: DC

## 2018-06-25 NOTE — Discharge Instructions (Addendum)
It was our pleasure to provide your ER care today - we hope that you feel better.  Take prednisone as prescribed. Use albuterol inhaler as need.   Follow up with your doctor/pulmonary doctor in the coming week.  Return to ER if worse, new symptoms, fevers, increased trouble breathing, other concern.

## 2018-06-25 NOTE — ED Provider Notes (Signed)
[facility] EMERGENCY DEPARTMENT Provider Note   CSN: [id number] Arrival date & time: 06/25/18  2016    History   Chief Complaint Chief Complaint  Patient presents with  . Shortness of Breath    HPI Patricia Johnston is a 68 y.o. female.     Patient with hx copd, c/o increased sob/wheezing for past 5 days. Symptoms gradual onset, moderate, persistent, slowly worsening. Uses mdi prn. Denies recent steroid use. +non productive cough. No sore throat or runny nose. No fever or chills. No recent travel or known covid exposure. Denies leg swelling or pain. No orthopnea/pnd. Compliant w home meds. No home o2 use.   The history is provided by the patient.  Shortness of Breath    Past Medical History:  Diagnosis Date  . Asthma   . COPD (chronic obstructive pulmonary disease) ([place])    pt states due to [place] contracted COPD  . Osteoporosis     Patient Active Problem List   Diagnosis Date Noted  . Acute and chronic respiratory failure with hypoxia ([place]) 05/22/2016  . Bronchiectasis without complication ([place]) 08/30/1599  . Elevated IgE level 11/14/2015  . Eosinophilia 11/14/2015  . COPD with acute exacerbation ([place]) 04/19/2015  . Feeling of sadness 04/19/2015    Past Surgical History:  Procedure Laterality Date  . ABDOMINAL HYSTERECTOMY    . bunionectomy       OB History   No obstetric history on file.      Home Medications    Prior to Admission medications   Medication Sig Start Date End Date Taking? Authorizing Provider  albuterol (PROVENTIL HFA;VENTOLIN HFA) 108 (90 Base) MCG/ACT inhaler Inhale 1-2 puffs into the lungs every 6 (six) hours as needed for wheezing or shortness of breath.    [provider]  Dextromethorphan-Guaifenesin 5-100 MG/5ML LIQD Take 5 mLs by mouth every 4 (four) hours as needed (cough).     [provider]  diphenhydrAMINE (BENADRYL) 25 MG tablet Take 25 mg by mouth every 6 (six) hours as needed  (anxiety).    [provider]  feeding supplement, ENSURE ENLIVE, (ENSURE ENLIVE) LIQD Take 237 mLs by mouth 2 (two) times daily between meals. 05/26/16   Florencia Reasons, MD  guaiFENesin (MUCINEX) 600 MG 12 hr tablet Take 1 tablet (600 mg total) by mouth 2 (two) times daily. Patient taking differently: Take 600 mg by mouth 2 (two) times daily as needed.  05/26/16   Florencia Reasons, MD  ipratropium-albuterol (DUONEB) 0.5-2.5 (3) MG/3ML SOLN Take 3 mLs by nebulization every 6 (six) hours as needed (sob and wheezing).     [provider]  mometasone [facility]) 220 MCG/INH inhaler Inhale 2 puffs into the lungs daily.    [provider]  OXYGEN Inhale 2 L into the lungs daily as needed (for exertion). Reported on 04/19/2015    [provider]  predniSONE (DELTASONE) 10 MG tablet Take 30mg  on day one, 20mg  on day tow, 10mg  on day three, then stop. Patient not taking: Reported on 06/16/2017 05/26/16   Florencia Reasons, MD  PredniSONE 2 MG TBEC Take 2 tablets by mouth daily as needed (sob).    [provider]  saccharomyces boulardii (FLORASTOR) 250 MG capsule Take 1 capsule (250 mg total) by mouth 2 (two) times daily. Patient not taking: Reported on 06/16/2017 05/26/16   Florencia Reasons, MD  Tiotropium Bromide-Olodaterol (STIOLTO RESPIMAT) 2.5-2.5 MCG/ACT AERS Inhale 2 puffs into the lungs daily.    [provider]  Family History No family history on file.  Social History Social History   Tobacco Use  . Smoking status: Never Smoker  . Smokeless tobacco: Never Used  Substance Use Topics  . Alcohol use: No    Alcohol/week: 0.0 standard drinks  . Drug use: No     Allergies   Flonase [fluticasone propionate] and Nasacort [triamcinolone]   Review of Systems Review of Systems  Respiratory: Positive for shortness of breath.      Physical Exam Updated Vital Signs There were no vitals taken for this visit.  Physical Exam   ED Treatments / Results  Labs (all labs  ordered are listed, but only abnormal results are displayed) Results for orders placed or performed during the hospital encounter of 06/25/18  CBC  Result Value Ref Range   WBC 5.6 4.0 - 10.5 K/uL   RBC 4.36 3.87 - 5.11 MIL/uL   Hemoglobin 13.8 12.0 - 15.0 g/dL   HCT 41.2 36.0 - 46.0 %   MCV 94.5 80.0 - 100.0 fL   MCH 31.7 26.0 - 34.0 pg   MCHC 33.5 30.0 - 36.0 g/dL   RDW 11.9 11.5 - 15.5 %   Platelets 198 150 - 400 K/uL   nRBC 0.4 (H) 0.0 - 0.2 %  Basic metabolic panel  Result Value Ref Range   Sodium 141 135 - 145 mmol/L   Potassium 3.6 3.5 - 5.1 mmol/L   Chloride 106 98 - 111 mmol/L   CO2 24 22 - 32 mmol/L   Glucose, Bld 79 70 - 99 mg/dL   BUN 8 8 - 23 mg/dL   Creatinine, Ser 0.88 0.44 - 1.00 mg/dL   Calcium 9.5 8.9 - 10.3 mg/dL   GFR calc non Af Amer >60 >60 mL/min   GFR calc Af Amer >60 >60 mL/min   Anion gap 11 5 - 15  I-stat troponin, ED  Result Value Ref Range   Troponin i, poc 0.01 0.00 - 0.08 ng/mL   Comment 3           Dg Chest Port 1 View  Result Date: 06/25/2018 CLINICAL DATA:  Cough and short of breath EXAM: PORTABLE CHEST 1 VIEW COMPARISON:  05/22/2016 FINDINGS: Hyperinflation without focal pulmonary infiltrate. Borderline cardiomegaly. No pneumothorax. IMPRESSION: No active disease.  Hyperinflation Electronically Signed   By: Donavan Foil M.D.   On: 06/25/2018 20:43    EKG EKG Interpretation  Date/Time:  Saturday June 25 2018 20:26:33 EDT Ventricular Rate:  77 PR Interval:    QRS Duration: 90 QT Interval:  378 QTC Calculation: 428 R Axis:   73 Text Interpretation:  Sinus rhythm No significant change since last tracing Confirmed by Lajean Saver [phone]) on 06/25/2018 8:36:35 PM   Radiology Dg Chest Port 1 View  Result Date: 06/25/2018 CLINICAL DATA:  Cough and short of breath EXAM: PORTABLE CHEST 1 VIEW COMPARISON:  05/22/2016 FINDINGS: Hyperinflation without focal pulmonary infiltrate. Borderline cardiomegaly. No pneumothorax. IMPRESSION: No  active disease.  Hyperinflation Electronically Signed   By: Donavan Foil M.D.   On: 06/25/2018 20:43    Procedures Procedures (including critical care time)  Medications Ordered in ED Medications  albuterol (VENTOLIN HFA) 108 (90 Base) MCG/ACT inhaler 4 puff (has no administration in time range)  ipratropium (ATROVENT HFA) inhaler 2 puff (has no administration in time range)     Initial Impression / Assessment and Plan / ED Course  I have reviewed the triage vital signs and the nursing notes.  Pertinent labs &  imaging results that were available during my care of the patient were reviewed by me and considered in my medical decision making (see chart for details).  Labs. Cxr. Ecg.   Reviewed nursing notes and prior charts for additional history.   Albuterol mdi treatments, atrovent mdi tx.   Prednisone po.  cxr reviewed by me - no pna.  Labs reviewed by me - chem normal. Trop negative.  Wheezing improved. No increased wob.   Pt currently appears stable for d/c.     Final Clinical Impressions(s) / ED Diagnoses   Final diagnoses:  None    ED Discharge Orders    None       Lajean Saver, MD 06/25/18 2124

## 2018-06-25 NOTE — ED Triage Notes (Signed)
Pt here for SOB with some chest pain.  Pt denies fevers at home, with progressive Sx since Monday.  Pt has a Hx of Asthma, patient just used her HFA 1 puff.

## 2018-06-28 DIAGNOSIS — J449 Chronic obstructive pulmonary disease, unspecified: Secondary | ICD-10-CM | POA: Diagnosis not present

## 2018-06-28 DIAGNOSIS — J4541 Moderate persistent asthma with (acute) exacerbation: Secondary | ICD-10-CM | POA: Diagnosis not present

## 2018-09-28 ENCOUNTER — Encounter (HOSPITAL_COMMUNITY): Payer: Self-pay | Admitting: *Deleted

## 2018-09-28 ENCOUNTER — Telehealth (HOSPITAL_COMMUNITY): Payer: Self-pay

## 2018-09-28 NOTE — Progress Notes (Addendum)
Received Authorization #[id number] for this pt to participate in Pulmonary Rehab with the diagnosis of COPD due to chronic obstructive asthma by Dr. Gwenette Greet at the [facility] in [place]. Review of medical records provided. Pt has Covid Risk Score 2.   Pt is appropriate to participate. Pt previously participated in Pulmonary Rehab in 2017. Will contact pt for scheduling orientation. Cherre Huger, BSN Cardiac and Training and development officer

## 2018-09-30 ENCOUNTER — Telehealth (HOSPITAL_COMMUNITY): Payer: Self-pay

## 2018-09-30 ENCOUNTER — Encounter (HOSPITAL_COMMUNITY): Payer: Self-pay | Admitting: *Deleted

## 2018-09-30 NOTE — Progress Notes (Signed)
Returned call to [place] at the [facility] regarding scheduling for pulmonary rehab. Left message letting the community care coordinator know that the pt had been contacted but wanted to talk to Dr. Gwenette Greet first before committing to schedule. Pt to call us when she is ready. Cherre Huger, BSN Cardiac and Training and development officer

## 2018-10-13 DIAGNOSIS — K228 Other specified diseases of esophagus: Secondary | ICD-10-CM

## 2018-10-13 DIAGNOSIS — K2289 Other specified disease of esophagus: Secondary | ICD-10-CM

## 2018-10-13 HISTORY — DX: Other specified diseases of esophagus: K22.8

## 2018-10-13 HISTORY — DX: Other specified disease of esophagus: K22.89

## 2018-11-01 ENCOUNTER — Other Ambulatory Visit: Payer: Self-pay | Admitting: Family Medicine

## 2018-11-01 ENCOUNTER — Encounter: Payer: Self-pay | Admitting: Internal Medicine

## 2018-11-01 DIAGNOSIS — E2839 Other primary ovarian failure: Secondary | ICD-10-CM

## 2018-11-04 DIAGNOSIS — Z23 Encounter for immunization: Secondary | ICD-10-CM | POA: Diagnosis not present

## 2018-11-24 DIAGNOSIS — K228 Other specified diseases of esophagus: Secondary | ICD-10-CM | POA: Diagnosis not present

## 2018-11-28 ENCOUNTER — Other Ambulatory Visit: Payer: Self-pay | Admitting: Family Medicine

## 2018-11-28 ENCOUNTER — Telehealth: Payer: Self-pay | Admitting: Internal Medicine

## 2018-11-28 DIAGNOSIS — K2289 Other specified disease of esophagus: Secondary | ICD-10-CM

## 2018-11-28 DIAGNOSIS — K228 Other specified diseases of esophagus: Secondary | ICD-10-CM

## 2018-11-28 NOTE — Telephone Encounter (Signed)
Left message for pt to call back  °

## 2018-11-29 NOTE — Telephone Encounter (Signed)
Noted. I have notes on file for Dr Hilarie Fredrickson to review at her office visit.

## 2018-11-29 NOTE — Telephone Encounter (Signed)
Patient wanted to let us know she brought her records, ct scan yesterday for Dr. Hilarie Fredrickson to review prior to her appt on Monday. She will try to bring bone density tomorrow for him to have.

## 2018-11-30 ENCOUNTER — Encounter: Payer: Self-pay | Admitting: *Deleted

## 2018-12-05 ENCOUNTER — Encounter: Payer: Self-pay | Admitting: Internal Medicine

## 2018-12-05 ENCOUNTER — Ambulatory Visit (INDEPENDENT_AMBULATORY_CARE_PROVIDER_SITE_OTHER): Payer: Medicare Other | Admitting: Internal Medicine

## 2018-12-05 VITALS — BP 114/64 | HR 84 | Temp 98.3°F | Ht 66.0 in | Wt 130.4 lb

## 2018-12-05 DIAGNOSIS — J449 Chronic obstructive pulmonary disease, unspecified: Secondary | ICD-10-CM

## 2018-12-05 DIAGNOSIS — K228 Other specified diseases of esophagus: Secondary | ICD-10-CM

## 2018-12-05 DIAGNOSIS — K2289 Other specified disease of esophagus: Secondary | ICD-10-CM

## 2018-12-05 DIAGNOSIS — R9389 Abnormal findings on diagnostic imaging of other specified body structures: Secondary | ICD-10-CM

## 2018-12-05 NOTE — Patient Instructions (Signed)
You have been scheduled for a Barium Esophogram at [facility] (1st floor of the hospital) on 12/08/2018 at 11:00 AM. Please arrive 15 minutes prior to your appointment for registration. Make certain not to have anything to eat or drink 3 hours prior to your test. If you need to reschedule for any reason, please contact radiology at [phone] to do so. _____________________________________________________________ A barium swallow is an examination that concentrates on views of the esophagus. This tends to be a double contrast exam (barium and two liquids which, when combined, create a gas to distend the wall of the oesophagus) or single contrast (non-ionic iodine based). The study is usually tailored to your symptoms so a good history is essential. Attention is paid during the study to the form, structure and configuration of the esophagus, looking for functional disorders (such as aspiration, dysphagia, achalasia, motility and reflux) EXAMINATION You may be asked to change into a gown, depending on the type of swallow being performed. A radiologist and radiographer will perform the procedure. The radiologist will advise you of the type of contrast selected for your procedure and direct you during the exam. You will be asked to stand, sit or lie in several different positions and to hold a small amount of fluid in your mouth before being asked to swallow while the imaging is performed .In some instances you may be asked to swallow barium coated marshmallows to assess the motility of a solid food bolus. The exam can be recorded as a digital or video fluoroscopy procedure. POST PROCEDURE It will take 1-2 days for the barium to pass through your system. To facilitate this, it is important, unless otherwise directed, to increase your fluids for the next 24-48hrs and to resume your normal diet.  This test typically takes about 30 minutes to  perform. _____________________________________________________________ If you are age 73 or older, your body mass index should be between 23-30. Your Body mass index is 21.04 kg/m. If this is out of the aforementioned range listed, please consider follow up with your Primary Care Provider.  If you are age 21 or younger, your body mass index should be between 19-25. Your Body mass index is 21.04 kg/m. If this is out of the aformentioned range listed, please consider follow up with your Primary Care Provider.

## 2018-12-05 NOTE — Progress Notes (Signed)
HPI: Patricia Johnston is a 68 year old female with a history of COPD, GERD, osteoporosis who seen to evaluate abnormal esophageal imaging.  Patient presents alone today.  She is referred from the [place].  She reports that she does have a history of COPD which is treated with inhalers but also intermittent prednisone therapy.  She had a CT scan performed to evaluate her lung disease in August and this showed distal esophageal thickening leading to this referral.  From a GI perspective she denies issue with frequent heartburn or dysphagia/odynophagia.  She had one episode of regurgitation after eating last week but had really never had this before.  No chest pain.  No nausea or vomiting.  No abdominal pain.  Bowel movements are regular without blood or melena.  She had colonoscopy which was normal in 2002.  She had a colonoscopy in 2013 which was also reportedly normal.  She was told to repeat this in 10 years.  She denies a family history of GI tract malignancy.  She reportedly was told to do a sputum test but wonders if she can have this done here.  She would have difficulty getting the specimen to the lab within 1 hour of collecting it.  My suspicion is that they are trying to do it test for AFB given her CT findings of bilateral bronchial wall thickening with scattered clusters of tree-in-bud nodules in the right middle lobe.  Past Medical History:  Diagnosis Date  . Asthma   . COPD (chronic obstructive pulmonary disease) ([place])    pt states due to [place] contracted COPD  . Eczema   . Esophageal thickening 10/13/2018   on CT- [facility]  . GERD (gastroesophageal reflux disease)   . Osteoporosis   . Pulmonary emphysema ([place])   . Pulmonary nodules   . Traumatic arthritis   . Umbilical hernia     Past Surgical History:  Procedure Laterality Date  . ABDOMINAL HYSTERECTOMY    . bunionectomy      Outpatient Medications Prior to Visit  Medication  Sig Dispense Refill  . albuterol (PROVENTIL HFA;VENTOLIN HFA) 108 (90 Base) MCG/ACT inhaler Inhale 1-2 puffs into the lungs every 6 (six) hours as needed for wheezing or shortness of breath.    Marland Kitchen albuterol (VENTOLIN HFA) 108 (90 Base) MCG/ACT inhaler Inhale 2 puffs into the lungs every 4 (four) hours as needed for wheezing or shortness of breath. 1 Inhaler 1  . Dextromethorphan-Guaifenesin 5-100 MG/5ML LIQD Take 5 mLs by mouth every 4 (four) hours as needed (cough).     . diphenhydrAMINE (BENADRYL) 25 MG tablet Take 25 mg by mouth every 6 (six) hours as needed (anxiety).    . feeding supplement, ENSURE ENLIVE, (ENSURE ENLIVE) LIQD Take 237 mLs by mouth 2 (two) times daily between meals. 237 mL 12  . guaiFENesin (MUCINEX) 600 MG 12 hr tablet Take 1 tablet (600 mg total) by mouth 2 (two) times daily. (Patient taking differently: Take 600 mg by mouth 2 (two) times daily as needed. ) 30 tablet 0  . ipratropium-albuterol (DUONEB) 0.5-2.5 (3) MG/3ML SOLN Take 3 mLs by nebulization every 6 (six) hours as needed (sob and wheezing).     . mometasone (ASMANEX) 220 MCG/INH inhaler Inhale 2 puffs into the lungs daily.    . OXYGEN Inhale 2 L into the lungs daily as needed (for exertion). Reported on 04/19/2015    . predniSONE (DELTASONE) 10 MG tablet Take 30mg  on day one, 20mg  on day tow, 10mg  on  day three, then stop. (Patient not taking: Reported on 06/16/2017) 6 tablet 0  . predniSONE (DELTASONE) 20 MG tablet Take 3 tablets (60 mg total) by mouth daily. 12 tablet 0  . PredniSONE 2 MG TBEC Take 2 tablets by mouth daily as needed (sob).    . saccharomyces boulardii (FLORASTOR) 250 MG capsule Take 1 capsule (250 mg total) by mouth 2 (two) times daily. (Patient not taking: Reported on 06/16/2017) 60 capsule 0  . Tiotropium Bromide-Olodaterol (STIOLTO RESPIMAT) 2.5-2.5 MCG/ACT AERS Inhale 2 puffs into the lungs daily.     No facility-administered medications prior to visit.     Allergies  Allergen Reactions  .  Flonase [Fluticasone Propionate] Other (See Comments)    Nasal bleeding  . Nasacort [Triamcinolone] Other (See Comments)    Nasal bleeding    No family history on file.  Social History   Tobacco Use  . Smoking status: Never Smoker  . Smokeless tobacco: Never Used  Substance Use Topics  . Alcohol use: No    Alcohol/week: 0.0 standard drinks  . Drug use: No    ROS: As per history of present illness, otherwise negative  Temp 98.3 F (36.8 C)   Ht 5\' 6"  (1.676 m)   Wt 130 lb 6 oz (59.1 kg)   BMI 21.04 kg/m  Constitutional: Well-developed and well-nourished. No distress. HEENT: Normocephalic and atraumatic.  Conjunctivae are normal.  No scleral icterus. Neck: Neck supple. Trachea midline. Cardiovascular: Normal rate, regular rhythm and intact distal pulses. No M/R/G Pulmonary/chest: Effort normal and breath sounds normal somewhat distant. No wheezing, rales or rhonchi. Abdominal: Soft, nontender, nondistended. Bowel sounds active throughout. There are no masses palpable. No hepatosplenomegaly. Extremities: no clubbing, cyanosis, or edema Neurological: Alert and oriented to person place and time. Skin: Skin is warm and dry.  Psychiatric: Normal mood and affect. Behavior is normal.  RELEVANT LABS AND IMAGING: CBC    Component Value Date/Time   WBC 5.6 06/25/2018 2038   RBC 4.36 06/25/2018 2038   HGB 13.8 06/25/2018 2038   HCT 41.2 06/25/2018 2038   PLT 198 06/25/2018 2038   MCV 94.5 06/25/2018 2038   MCH 31.7 06/25/2018 2038   MCHC 33.5 06/25/2018 2038   RDW 11.9 06/25/2018 2038   LYMPHSABS 2.9 05/26/2016 0557   MONOABS 0.4 05/26/2016 0557   EOSABS 0.3 05/26/2016 0557   BASOSABS 0.0 05/26/2016 0557    CMP     Component Value Date/Time   NA 141 06/25/2018 2038   K 3.6 06/25/2018 2038   CL 106 06/25/2018 2038   CO2 24 06/25/2018 2038   GLUCOSE 79 06/25/2018 2038   BUN 8 06/25/2018 2038   CREATININE 0.88 06/25/2018 2038   CALCIUM 9.5 06/25/2018 2038   PROT  6.7 05/24/2016 0524   ALBUMIN 3.8 05/24/2016 0524   AST 25 05/24/2016 0524   ALT 20 05/24/2016 0524   ALKPHOS 70 05/24/2016 0524   BILITOT 0.7 05/24/2016 0524   GFRNONAA >60 06/25/2018 2038   GFRAA >60 06/25/2018 2038   CT scan of the chest without contrast performed 10/13/2018 --Circumferential thickening of the distal esophagus.  This is nonspecific, possibly a displaced GE junction or soft tissue lesion.  Recommend further evaluation with esophagram or endoscopy.  ASSESSMENT/PLAN: 68 year old female with a history of COPD, GERD, osteoporosis who seen to evaluate abnormal esophageal imaging.    1.  Esophageal thickening by CT scan chest --fortunately she does not have significant esophageal symptoms and denies a history of longstanding heartburn.  We discussed upper endoscopy versus barium esophagram.  She prefers to start with barium esophagram.  I explained to her that if this is abnormal upper endoscopy would be recommended.  She understands. --Barium esophagram with tablet  2.  COPD with bronchial wall thickening/pulmonary nodules --question raised of infectious or inflammatory etiology.  Sputum cultures recommended and ordered by the [facility] but she is having a hard time submitting them.  She would like to have these done in [place].  She reports I am her only [facility] provider.  I am willing to see if these studies can be submitted here and faxed to the [place] but I need to know specifically from her prescribing provider which sputum studies they are requesting.  3.  Osteoporosis --recent DEXA scan showing bone mineralization in the lumbar spine and hips indicates osteoporosis which was progressed from 06/25/2015.  She is being considered for bisphosphonate therapy  4.  CRC screening --reportedly normal colonoscopy in 2002 and 2013.  If he recollection is accurate her next screening colonoscopy would be recommended in 2023      [id number], [place],  [place] [address]

## 2018-12-08 ENCOUNTER — Ambulatory Visit (HOSPITAL_COMMUNITY)
Admission: RE | Admit: 2018-12-08 | Discharge: 2018-12-08 | Disposition: A | Discharge status: Home / Self Care | Payer: Medicare Other | Source: Ambulatory Visit | Attending: Internal Medicine | Admitting: Internal Medicine

## 2018-12-08 ENCOUNTER — Other Ambulatory Visit: Payer: Self-pay

## 2018-12-08 DIAGNOSIS — K224 Dyskinesia of esophagus: Secondary | ICD-10-CM | POA: Diagnosis not present

## 2018-12-08 DIAGNOSIS — R9389 Abnormal findings on diagnostic imaging of other specified body structures: Secondary | ICD-10-CM | POA: Diagnosis not present

## 2018-12-09 ENCOUNTER — Other Ambulatory Visit (HOSPITAL_COMMUNITY): Payer: Medicare Other

## 2018-12-20 DIAGNOSIS — M25511 Pain in right shoulder: Secondary | ICD-10-CM | POA: Diagnosis not present

## 2019-01-20 ENCOUNTER — Other Ambulatory Visit: Payer: Self-pay | Admitting: Family Medicine

## 2019-01-20 DIAGNOSIS — Z1231 Encounter for screening mammogram for malignant neoplasm of breast: Secondary | ICD-10-CM

## 2019-01-25 DIAGNOSIS — M810 Age-related osteoporosis without current pathological fracture: Secondary | ICD-10-CM | POA: Diagnosis not present

## 2019-01-25 DIAGNOSIS — M25512 Pain in left shoulder: Secondary | ICD-10-CM | POA: Diagnosis not present

## 2019-01-25 DIAGNOSIS — K228 Other specified diseases of esophagus: Secondary | ICD-10-CM | POA: Diagnosis not present

## 2019-01-25 DIAGNOSIS — M25511 Pain in right shoulder: Secondary | ICD-10-CM | POA: Diagnosis not present

## 2019-01-30 ENCOUNTER — Ambulatory Visit: Payer: Medicare Other | Attending: Family Medicine | Admitting: Physical Therapy

## 2019-01-30 ENCOUNTER — Other Ambulatory Visit: Payer: Self-pay

## 2019-01-30 ENCOUNTER — Encounter: Payer: Self-pay | Admitting: Physical Therapy

## 2019-01-30 DIAGNOSIS — M25512 Pain in left shoulder: Secondary | ICD-10-CM

## 2019-01-30 DIAGNOSIS — M6281 Muscle weakness (generalized): Secondary | ICD-10-CM | POA: Diagnosis not present

## 2019-01-30 DIAGNOSIS — M25511 Pain in right shoulder: Secondary | ICD-10-CM | POA: Insufficient documentation

## 2019-01-30 NOTE — Therapy (Signed)
[facility] [address], [place], [place], [address] Phone: [phone]   Fax:  [phone]  Physical Therapy Evaluation  Patient Details  Name: Patricia Johnston MRN: [id number] Date of Birth: 1950/06/02 Referring Provider (PT): Lujean Amel, MD   Encounter Date: 01/30/2019  PT End of Session - 01/30/19 1505    Visit Number  1    Date for PT Re-Evaluation  03/06/19    PT Start Time  [id number]    PT Stop Time  1532    PT Time Calculation (min)  45 min    Activity Tolerance  Patient tolerated treatment well    Behavior During Therapy  [facility] for tasks assessed/performed       Past Medical History:  Diagnosis Date  . Asthma   . COPD (chronic obstructive pulmonary disease) ([place])    pt states due to [place] contracted COPD  . Eczema   . Esophageal thickening 10/13/2018   on CT- [facility]  . GERD (gastroesophageal reflux disease)   . Osteoporosis   . Pulmonary emphysema ([place])   . Pulmonary nodules   . Traumatic arthritis   . Umbilical hernia     Past Surgical History:  Procedure Laterality Date  . ABDOMINAL HYSTERECTOMY    . bunionectomy    . COLONOSCOPY     due 2022 or 2023 per patient    There were no vitals filed for this visit.   Subjective Assessment - 01/30/19 1454    Subjective  Pt had to catch a lady who was about to fall.  The last Saturday in October, she saw a young lady have a siezure.  She landed on the left arm    Patient Stated Goals  reaching up and to the side without, return to regular arm exercises    Currently in Pain?  Yes   not at rest but painful arc when raising it   Pain Score  10-Worst pain ever   sharp and quick   Pain Orientation  Right;Left   Lt worse than Rt   Pain Descriptors / Indicators  Aching;Sharp    Pain Type  Acute pain    Pain Radiating Towards  bicep and lateral shoulder    Aggravating Factors   reaching out to the side    Pain  Relieving Factors  heat helps momentarily    Effect of Pain on Daily Activities  can't reach straight out to the side    Multiple Pain Sites  No         [facility] PT Assessment - 01/31/19 0001      Assessment   Medical Diagnosis  M25.511 (ICD-10-CM) - Pain in right shoulder; M25.512 (ICD-10-CM) - Pain in left shoulder    Referring Provider (PT)  Koirala, Dibas, MD    Onset Date/Surgical Date  --   one month ago   Hand Dominance  Right    Prior Therapy  No      Precautions   Precautions  None      Restrictions   Weight Bearing Restrictions  No      Balance Screen   Has the patient fallen in the past 6 months  No      [place] residence      Prior Function   Level of Independence  Independent    Leisure  exercises at home      Cognition   Overall Cognitive Status  Within Functional  Limits for tasks assessed      Posture/Postural Control   Posture Comments  rounded shoulders, thoracic kyphosis      ROM / Strength   AROM / PROM / Strength  Strength;AROM      AROM   Overall AROM Comments  pain is limited movement; pain with eccentric lowering    AROM Assessment Site  Shoulder    Right/Left Shoulder  Right;Left    Right Shoulder Flexion  152 Degrees    Right Shoulder ABduction  114 Degrees    Left Shoulder Flexion  130 Degrees    Left Shoulder ABduction  55 Degrees      Strength   Strength Assessment Site  Shoulder    Right/Left Shoulder  Right;Left    Right Shoulder Flexion  5/5    Right Shoulder Extension  5/5    Right Shoulder ABduction  5/5    Right Shoulder Internal Rotation  5/5    Right Shoulder External Rotation  5/5    Right Shoulder Horizontal ABduction  4+/5    Right Shoulder Horizontal ADduction  5/5    Left Shoulder Flexion  4+/5    Left Shoulder Extension  5/5    Left Shoulder ABduction  4/5    Left Shoulder Internal Rotation  5/5    Left Shoulder External Rotation  5/5    Left Shoulder Horizontal ABduction   4/5    Left Shoulder Horizontal ADduction  4+/5      Palpation   Palpation comment  head of humerus superior and posterior and anterior deltoid TTP                 Objective measurements completed on examination: See above findings.      [place] Adult PT Treatment/Exercise - 01/31/19 0001      Self-Care   Self-Care  Other Self-Care Comments    Other Self-Care Comments   initial HEP and reviewed discontinueing normal weight lifting and hoolahooping with arms             PT Education - 01/31/19 1412    Education Details  Access Code: [email or web address] URL: [email or web address] Date: 01/31/2019 Prepared by: Jari Favre  Exercises Isometric Shoulder Extension at [place] - 10 reps - 3 sets - 1x daily - 7x weekly Seated Scapular Retraction - 10 reps - 3 sets - 1x daily - 7x weekly    Person(s) Educated  Patient    Methods  Explanation;Demonstration;Handout;Verbal cues;Tactile cues    Comprehension  Verbalized understanding;Returned demonstration       PT Short Term Goals - 01/31/19 1413      PT SHORT TERM GOAL #1   Title  Patient will dmeostrate full pain free UE AROM    Baseline  painful arc and limited ROM    Time  4    Period  Weeks    Status  New    Target Date  02/27/19      PT SHORT TERM GOAL #2   Title  ind with initial HEP    Time  4    Period  Weeks    Status  New    Target Date  02/27/19        PT Long Term Goals - 01/31/19 1415      PT LONG TERM GOAL #1   Title  ind with advanced HEP    Time  8    Period  Weeks    Status  New    Target Date  03/27/19  PT LONG TERM GOAL #2   Title  FOTO < or = to 35% limited    Time  8    Period  Weeks    Status  New    Target Date  03/27/19      PT LONG TERM GOAL #3   Title  bilateral shoulder flexion and abduction 5/5 and pain free    Time  8    Period  Weeks    Status  New    Target Date  03/27/19      PT LONG TERM GOAL #4   Title  Pt able to do her normal exercises safely and  without pain    Time  8    Period  Weeks    Status  New    Target Date  03/27/19      PT LONG TERM GOAL #5   Title  Pt will be able to put dishes away and do house work without pain afterwards    Time  8    Period  Weeks    Status  New    Target Date  03/27/19             Plan - 01/31/19 0830    Clinical Impression Statement  Pt presents to skilled PT due to bilateral shoulder pain Lt>Rt ever since she caught someone from falling and strained her arms.  Pt demonstrates very good UE strength overall with 5/5 strength on the Rt side.  She does have left shoulder and elbow weakness with pain on the Lt.  She demonstrates painful arc and pain with lowering her arms bilaterally.  Pt has limited ROM bilaterally Lt being worse than Rt UE.  She demonstrates increased thoracic kyphosis. Pt will benefit from skilled PT in order to return to maximum function and use of UE without pain.    Examination-Activity Limitations  Dressing;Lift;Reach Overhead    Stability/Clinical Decision Making  Stable/Uncomplicated    Clinical Decision Making  Low    Rehab Potential  Excellent    PT Frequency  2x / week    PT Duration  8 weeks    PT Treatment/Interventions  ADLs/Self Care Home Management;Cryotherapy;Electrical Stimulation;Iontophoresis 4mg /ml Dexamethasone;Moist Heat;Therapeutic exercise;Therapeutic activities;Neuromuscular re-education;Patient/family education;Manual techniques;Dry needling;Taping    PT Next Visit Plan  f/u on pain level with adjustments to activities suggested at eval (not hoolahooping with UE), thoracic mobility, pec stretches, AAROM bilateral shoulders, basic strength as tolerated    PT Home Exercise Plan  Access Code: [email or web address]    Consulted and Agree with Plan of Care  Patient       Patient will benefit from skilled therapeutic intervention in order to improve the following deficits and impairments:     Visit Diagnosis: Muscle weakness (generalized)  Acute pain of left  shoulder  Acute pain of right shoulder     Problem List Patient Active Problem List   Diagnosis Date Noted  . Acute and chronic respiratory failure with hypoxia ([place]) 05/22/2016  . Bronchiectasis without complication ([place]) [id number]  . Elevated IgE level 11/14/2015  . Eosinophilia 11/14/2015  . COPD with acute exacerbation ([place]) 04/19/2015  . Feeling of sadness 04/19/2015    Jule Ser, PT 01/31/2019, 2:26 PM  [facility] [address] [place], [place], [address] Phone: [phone]   Fax:  [phone]  Name: Patricia Johnston MRN: [id number] Date of Birth: February 17, 1951

## 2019-01-31 DIAGNOSIS — J479 Bronchiectasis, uncomplicated: Secondary | ICD-10-CM | POA: Diagnosis not present

## 2019-01-31 DIAGNOSIS — R918 Other nonspecific abnormal finding of lung field: Secondary | ICD-10-CM | POA: Diagnosis not present

## 2019-01-31 NOTE — Patient Instructions (Signed)
Access Code: [email or web address]  URL: [email or web address]  Date: 01/31/2019  Prepared by: Jari Favre   Exercises  Isometric Shoulder Extension at Wall - 10 reps - 3 sets - 1x daily - 7x weekly  Seated Scapular Retraction - 10 reps - 3 sets - 1x daily - 7x weekly

## 2019-02-03 ENCOUNTER — Ambulatory Visit: Payer: Medicare Other | Attending: Family Medicine | Admitting: Physical Therapy

## 2019-02-03 ENCOUNTER — Other Ambulatory Visit: Payer: Self-pay

## 2019-02-03 DIAGNOSIS — M6281 Muscle weakness (generalized): Secondary | ICD-10-CM | POA: Diagnosis present

## 2019-02-03 DIAGNOSIS — G8929 Other chronic pain: Secondary | ICD-10-CM | POA: Diagnosis present

## 2019-02-03 DIAGNOSIS — M25512 Pain in left shoulder: Secondary | ICD-10-CM | POA: Insufficient documentation

## 2019-02-03 DIAGNOSIS — M25511 Pain in right shoulder: Secondary | ICD-10-CM | POA: Insufficient documentation

## 2019-02-03 DIAGNOSIS — M25562 Pain in left knee: Secondary | ICD-10-CM | POA: Diagnosis present

## 2019-02-03 DIAGNOSIS — R262 Difficulty in walking, not elsewhere classified: Secondary | ICD-10-CM

## 2019-02-03 DIAGNOSIS — M25561 Pain in right knee: Secondary | ICD-10-CM | POA: Diagnosis present

## 2019-02-03 NOTE — Therapy (Signed)
[facility] [address], [place], [place], [address] Phone: [phone]   Fax:  [phone]  Physical Therapy Treatment  Patient Details  Name: Patricia Johnston MRN: [id number] Date of Birth: [birth year] Referring Provider (PT): Lujean Amel, MD   Encounter Date: 02/03/2019  PT End of Session - 02/03/19 0758    Visit Number  2    Date for PT Re-Evaluation  03/06/19    PT Start Time  0758    PT Stop Time  0840    PT Time Calculation (min)  42 min    Activity Tolerance  Patient tolerated treatment well    Behavior During Therapy  [facility] for tasks assessed/performed       Past Medical History:  Diagnosis Date  . Asthma   . COPD (chronic obstructive pulmonary disease) ([place])    pt states due to [place] contracted COPD  . Eczema   . Esophageal thickening 10/13/2018   on CT- [facility]  . GERD (gastroesophageal reflux disease)   . Osteoporosis   . Pulmonary emphysema ([place])   . Pulmonary nodules   . Traumatic arthritis   . Umbilical hernia     Past Surgical History:  Procedure Laterality Date  . ABDOMINAL HYSTERECTOMY    . bunionectomy    . COLONOSCOPY     due 2022 or 2023 per patient    There were no vitals filed for this visit.  Subjective Assessment - 02/03/19 0802    Subjective  I have no pain this AM.    Currently in Pain?  No/denies    Multiple Pain Sites  No                       [facility] Adult PT Treatment/Exercise - 02/03/19 0001      Self-Care   Other Self-Care Comments   review of home & exercise modifications/slow progressions, reducing the reps for her hoola hooping. Pt verbally understood all principles.      Shoulder Exercises: Seated   Retraction  --   10x/review of initial HEP   Other Seated Exercises  Military press: AROM 20x, then with "pretend resistance."     Other Seated Exercises  2# 3 way raise 10x ea bil: added to HEP   1# for LT  abd, some discomfort noted     Shoulder Exercises: Standing   Row  Strengthening;Both;20 reps;Theraband    Theraband Level (Shoulder Row)  Level 3 (Green)      Shoulder Exercises: ROM/Strengthening   UBE (Upper Arm Bike)  L1 2x2 with PTA present              PT Education - 02/03/19 [id number]    Education Details  HEP: deltoid PREs using 2# WTS for home. Pt needs 1# or less for LT shoulder abduction.    Person(s) Educated  Patient    Methods  Explanation;Demonstration;Verbal cues;Handout    Comprehension  Verbalized understanding;Returned demonstration       PT Short Term Goals - 02/03/19 0830      PT SHORT TERM GOAL #2   Title  ind with initial HEP        PT Long Term Goals - 01/31/19 1415      PT LONG TERM GOAL #1   Title  ind with advanced HEP    Time  8    Period  Weeks    Status  New    Target Date  03/27/19  PT LONG TERM GOAL #2   Title  FOTO < or = to 35% limited    Time  8    Period  Weeks    Status  New    Target Date  03/27/19      PT LONG TERM GOAL #3   Title  bilateral shoulder flexion and abduction 5/5 and pain free    Time  8    Period  Weeks    Status  New    Target Date  03/27/19      PT LONG TERM GOAL #4   Title  Pt able to do her normal exercises safely and without pain    Time  8    Period  Weeks    Status  New    Target Date  03/27/19      PT LONG TERM GOAL #5   Title  Pt will be able to put dishes away and do house work without pain afterwards    Time  8    Period  Weeks    Status  New    Target Date  03/27/19            Plan - 02/03/19 0804    Clinical Impression Statement  Pt arrives to therapy painfree. She comments she "is much better than last week." She demonstrated lifting both arms over her head cautiously but with no pain. Pt is compliant and independent in initial HEP. HEP was progressed to included deltoid PRES as pt has light weights at home. Lt abduction was the only muscle group that required less  resistance (1#) due to some discomfort and strain. Pt appears to be compliant with slowiung down her previous exercise routine and returning to them in a more gradual approach.    Examination-Activity Limitations  Dressing;Lift;Reach Overhead    Stability/Clinical Decision Making  Stable/Uncomplicated    Rehab Potential  Excellent    PT Frequency  2x / week    PT Duration  8 weeks    PT Treatment/Interventions  ADLs/Self Care Home Management;Cryotherapy;Electrical Stimulation;Iontophoresis 4mg /ml Dexamethasone;Moist Heat;Therapeutic exercise;Therapeutic activities;Neuromuscular re-education;Patient/family education;Manual techniques;Dry needling;Taping    PT Next Visit Plan  Review new PREs, measure ROM, review goals,    PT Home Exercise Plan  Access Code: [email or web address]    Consulted and Agree with Plan of Care  Patient       Patient will benefit from skilled therapeutic intervention in order to improve the following deficits and impairments:     Visit Diagnosis: Acute pain of left shoulder  Muscle weakness (generalized)  Acute pain of right shoulder  Chronic pain of left knee  Chronic pain of right knee  Difficulty in walking, not elsewhere classified     Problem List Patient Active Problem List   Diagnosis Date Noted  . Acute and chronic respiratory failure with hypoxia ([place]) 05/22/2016  . Bronchiectasis without complication ([place]) [id number]  . Elevated IgE level 11/14/2015  . Eosinophilia 11/14/2015  . COPD with acute exacerbation ([place]) 04/19/2015  . Feeling of sadness 04/19/2015    Cici Rodriges, PTA 02/03/2019, 8:45 AM  [facility] [address] [place], [place], [address] Phone: [phone]   Fax:  [phone]  Name: Patricia Johnston MRN: [id number] Date of Birth: 1951-01-29  Access Code: [email or web address]  URL: [email or web address]  Date: 02/03/2019  Prepared by: Myrene Galas    Exercises  Isometric Shoulder Extension at [place] - 10 reps - 3 sets - 1x daily - 7x weekly  Seated Scapular Retraction - 10 reps - 3 sets - 1x daily - 7x weekly  Seated Shoulder Flexion - 10 reps - 1 sets - 1x daily - 7x weekly  Seated Single Arm Shoulder Scaption - 10 reps - 1 sets - 1x daily - 7x weekly  Seated Single Arm Shoulder Horizontal Abduction and Adduction - 10 reps - 1 sets - 1x daily - 7x weekly

## 2019-02-06 ENCOUNTER — Encounter: Payer: Self-pay | Admitting: Physical Therapy

## 2019-02-06 ENCOUNTER — Ambulatory Visit: Payer: Medicare Other | Admitting: Physical Therapy

## 2019-02-06 ENCOUNTER — Other Ambulatory Visit: Payer: Self-pay

## 2019-02-06 DIAGNOSIS — M25511 Pain in right shoulder: Secondary | ICD-10-CM

## 2019-02-06 DIAGNOSIS — M25512 Pain in left shoulder: Secondary | ICD-10-CM

## 2019-02-06 DIAGNOSIS — M6281 Muscle weakness (generalized): Secondary | ICD-10-CM

## 2019-02-06 NOTE — Therapy (Signed)
[facility] [address] [place], [place], [address] Phone: [phone]   Fax:  [phone]  Physical Therapy Treatment  Patient Details  Name: Patricia Johnston MRN: [id number] Date of Birth: 1950-07-20 Referring Provider (PT): Lujean Amel, MD   Encounter Date: 02/06/2019  PT End of Session - 02/06/19 1444    Visit Number  3    Date for PT Re-Evaluation  03/06/19    PT Start Time  1444    PT Stop Time  1522    PT Time Calculation (min)  38 min    Activity Tolerance  Patient tolerated treatment well    Behavior During Therapy  [facility] for tasks assessed/performed       Past Medical History:  Diagnosis Date  . Asthma   . COPD (chronic obstructive pulmonary disease) ([place])    pt states due to [place] contracted COPD  . Eczema   . Esophageal thickening 10/13/2018   on CT- [facility]  . GERD (gastroesophageal reflux disease)   . Osteoporosis   . Pulmonary emphysema ([place])   . Pulmonary nodules   . Traumatic arthritis   . Umbilical hernia     Past Surgical History:  Procedure Laterality Date  . ABDOMINAL HYSTERECTOMY    . bunionectomy    . COLONOSCOPY     due 2022 or 2023 per patient    There were no vitals filed for this visit.  Subjective Assessment - 02/06/19 1445    Subjective  I did good with the exercises from last time. Left arm is stiff when I wake up. Denies problems during the day.    Currently in Pain?  No/denies    Multiple Pain Sites  No         [facility] PT Assessment - 02/06/19 0001      AROM   Right Shoulder ABduction  160 Degrees    Left Shoulder Flexion  150 Degrees    Left Shoulder ABduction  160 Degrees                   [facility] Adult PT Treatment/Exercise - 02/06/19 0001      Shoulder Exercises: Seated   Other Seated Exercises  3# overhead  press RT 10x bil LT 2#    Other Seated Exercises  2# front and scaption bil 2x10, 1# abduction bil  2x10      Shoulder Exercises: Pulleys   Flexion  3 minutes      Shoulder Exercises: ROM/Strengthening   UBE (Upper Arm Bike)  L2 2x2 with PTA present     Wall Pushups  10 reps      Iontophoresis   Type of Iontophoresis  Dexamethasone   1/6   Location  Anterior Lt shoulder    Dose  1 ml    Time  6 hr wear   skin intact, VC on skin care/wear time              PT Short Term Goals - 02/06/19 1507      PT SHORT TERM GOAL #2   Title  ind with initial HEP    Time  4    Period  Weeks    Status  Achieved        PT Long Term Goals - 01/31/19 1415      PT LONG TERM GOAL #1   Title  ind with advanced HEP    Time  8    Period  Weeks  Status  New    Target Date  03/27/19      PT LONG TERM GOAL #2   Title  FOTO < or = to 35% limited    Time  8    Period  Weeks    Status  New    Target Date  03/27/19      PT LONG TERM GOAL #3   Title  bilateral shoulder flexion and abduction 5/5 and pain free    Time  8    Period  Weeks    Status  New    Target Date  03/27/19      PT LONG TERM GOAL #4   Title  Pt able to do her normal exercises safely and without pain    Time  8    Period  Weeks    Status  New    Target Date  03/27/19      PT LONG TERM GOAL #5   Title  Pt will be able to put dishes away and do house work without pain afterwards    Time  8    Period  Weeks    Status  New    Target Date  03/27/19            Plan - 02/06/19 1522    Clinical Impression Statement  Pt arrives with no pian today. She continues to do well, no significant issues during the day but a siffness in her LT shoulder when she wakes in the morning. Pt is pain free with shoulder PREs today. We began ionto patch for anterior Lt shoulder tenderness.    Examination-Activity Limitations  Dressing;Lift;Reach Overhead    Stability/Clinical Decision Making  Stable/Uncomplicated    Rehab Potential  Excellent    PT Frequency  2x / week    PT Duration  8 weeks    PT  Treatment/Interventions  ADLs/Self Care Home Management;Cryotherapy;Electrical Stimulation;Iontophoresis 4mg /ml Dexamethasone;Moist Heat;Therapeutic exercise;Therapeutic activities;Neuromuscular re-education;Patient/family education;Manual techniques;Dry needling;Taping    PT Next Visit Plan  Shoulder stabillzation and strength, see how ionto went, ionto #2    PT Home Exercise Plan  Access Code: [email or web address]    Consulted and Agree with Plan of Care  Patient       Patient will benefit from skilled therapeutic intervention in order to improve the following deficits and impairments:     Visit Diagnosis: Acute pain of left shoulder  Muscle weakness (generalized)  Acute pain of right shoulder     Problem List Patient Active Problem List   Diagnosis Date Noted  . Acute and chronic respiratory failure with hypoxia ([place]) 05/22/2016  . Bronchiectasis without complication ([place]) [id number]  . Elevated IgE level 11/14/2015  . Eosinophilia 11/14/2015  . COPD with acute exacerbation ([place]) 04/19/2015  . Feeling of sadness 04/19/2015    Tressa Maldonado, PTA 02/06/2019, 4:21 PM   [facility] [address] [place], [place], [address] Phone: [phone]   Fax:  [phone]  Name: Patricia Johnston MRN: [id number] Date of Birth: 04/09/1950

## 2019-02-06 NOTE — Patient Instructions (Signed)

## 2019-02-08 ENCOUNTER — Other Ambulatory Visit: Payer: Self-pay

## 2019-02-08 ENCOUNTER — Ambulatory Visit: Payer: Medicare Other | Admitting: Physical Therapy

## 2019-02-08 ENCOUNTER — Encounter: Payer: Self-pay | Admitting: Physical Therapy

## 2019-02-08 DIAGNOSIS — M25511 Pain in right shoulder: Secondary | ICD-10-CM

## 2019-02-08 DIAGNOSIS — M25512 Pain in left shoulder: Secondary | ICD-10-CM | POA: Diagnosis not present

## 2019-02-08 DIAGNOSIS — M6281 Muscle weakness (generalized): Secondary | ICD-10-CM

## 2019-02-08 NOTE — Therapy (Signed)
[facility] [address] [place], [place], [address] Phone: [phone]   Fax:  [phone]  Physical Therapy Treatment  Patient Details  Name: Patricia Johnston MRN: [id number] Date of Birth: [birth year] Referring Provider (PT): Lujean Amel, MD   Encounter Date: 02/08/2019  PT End of Session - 02/08/19 1538    Visit Number  4    Date for PT Re-Evaluation  03/06/19    PT Start Time  [id number]    PT Stop Time  1613    PT Time Calculation (min)  42 min    Activity Tolerance  Patient tolerated treatment well    Behavior During Therapy  [facility] for tasks assessed/performed       Past Medical History:  Diagnosis Date  . Asthma   . COPD (chronic obstructive pulmonary disease) ([place])    pt states due to [place] contracted COPD  . Eczema   . Esophageal thickening 10/13/2018   on CT- [facility]  . GERD (gastroesophageal reflux disease)   . Osteoporosis   . Pulmonary emphysema ([place])   . Pulmonary nodules   . Traumatic arthritis   . Umbilical hernia     Past Surgical History:  Procedure Laterality Date  . ABDOMINAL HYSTERECTOMY    . bunionectomy    . COLONOSCOPY     due 2022 or 2023 per patient    There were no vitals filed for this visit.  Subjective Assessment - 02/08/19 1535    Subjective  The patch helped a little but stopped after about 5 hours.  States pain is better but still there today.    Currently in Pain?  Yes    Pain Score  8     Pain Location  Shoulder    Pain Orientation  Right;Left   Lt 8 and Rt 6   Pain Descriptors / Indicators  Sharp    Pain Type  Acute pain    Pain Frequency  Intermittent    Aggravating Factors   certain movements    Multiple Pain Sites  No                       [facility] Adult PT Treatment/Exercise - 02/08/19 0001      Shoulder Exercises: Standing   External Rotation  Strengthening;Right;20 reps;Theraband;Left    Theraband Level  (Shoulder External Rotation)  Level 4 (Blue)    External Rotation Limitations  cues to keep shoulder blades down and back    Flexion  Strengthening;Both;20 reps;Theraband    Theraband Level (Shoulder Flexion)  Level 2 (Red);Level 1 (Yellow)   Rt red, Lt yellow   Flexion Limitations  cues to keep shoulder blades down and back    Extension  Strengthening;Both;20 reps;Theraband    Theraband Level (Shoulder Extension)  Level 2 (Red)    Extension Limitations  cues to keep shoulder blades down and back    Other Standing Exercises  bicep curl 2lb x 15 each unable to do last 30 deg of ext without elevating shoulders    Other Standing Exercises  bicep curl 1lb x 20      Shoulder Exercises: ROM/Strengthening   UBE (Upper Arm Bike)  L2 2x2 with PTA present     Ball on Wall  circles 30x each way    Other ROM/Strengthening Exercises  shoulder abduction with cane 3 x each side               PT Short Term  Goals - 02/08/19 1559      PT SHORT TERM GOAL #1   Title  Patient will dmeostrate full pain free UE AROM    Baseline  still has painful arc Lt>Rt    Status  On-going        PT Long Term Goals - 01/31/19 1415      PT LONG TERM GOAL #1   Title  ind with advanced HEP    Time  8    Period  Weeks    Status  New    Target Date  03/27/19      PT LONG TERM GOAL #2   Title  FOTO < or = to 35% limited    Time  8    Period  Weeks    Status  New    Target Date  03/27/19      PT LONG TERM GOAL #3   Title  bilateral shoulder flexion and abduction 5/5 and pain free    Time  8    Period  Weeks    Status  New    Target Date  03/27/19      PT LONG TERM GOAL #4   Title  Pt able to do her normal exercises safely and without pain    Time  8    Period  Weeks    Status  New    Target Date  03/27/19      PT LONG TERM GOAL #5   Title  Pt will be able to put dishes away and do house work without pain afterwards    Time  8    Period  Weeks    Status  New    Target Date  03/27/19             Plan - 02/08/19 1615    Clinical Impression Statement  Pt states she has had less pain but still the worst at night when she moves.  Pt was able to progress exercises today.  She needs cues to stabilize shoulder and scapula.  Pt was able to add bicep curl with eccentric lowering but she has difficulty with last 30 degrees of elbow extension.  Pt will continue to benefit from skilled PT to work on improved shoulder stability and ROM with out pain.    PT Treatment/Interventions  ADLs/Self Care Home Management;Cryotherapy;Electrical Stimulation;Iontophoresis 4mg /ml Dexamethasone;Moist Heat;Therapeutic exercise;Therapeutic activities;Neuromuscular re-education;Patient/family education;Manual techniques;Dry needling;Taping    PT Next Visit Plan  Shoulder stabillzation and strength, see how ionto went, ionto #3    PT Home Exercise Plan  Access Code: [email or web address]    Consulted and Agree with Plan of Care  Patient       Patient will benefit from skilled therapeutic intervention in order to improve the following deficits and impairments:     Visit Diagnosis: Acute pain of left shoulder  Muscle weakness (generalized)  Acute pain of right shoulder     Problem List Patient Active Problem List   Diagnosis Date Noted  . Acute and chronic respiratory failure with hypoxia ([place]) 05/22/2016  . Bronchiectasis without complication ([place]) [id number]  . Elevated IgE level 11/14/2015  . Eosinophilia 11/14/2015  . COPD with acute exacerbation ([place]) 04/19/2015  . Feeling of sadness 04/19/2015    Jule Ser, PT 02/08/2019, 4:23 PM  [facility] [address], [place], [place], [address] Phone: [phone]   Fax:  [phone]  Name: Kaylanee Diallo MRN: [id number] Date of Birth: [birth year]

## 2019-02-10 ENCOUNTER — Encounter: Payer: Medicare Other | Admitting: Physical Therapy

## 2019-02-13 ENCOUNTER — Encounter: Payer: Self-pay | Admitting: Physical Therapy

## 2019-02-13 ENCOUNTER — Other Ambulatory Visit: Payer: Self-pay

## 2019-02-13 ENCOUNTER — Ambulatory Visit: Payer: Medicare Other | Admitting: Physical Therapy

## 2019-02-13 DIAGNOSIS — M6281 Muscle weakness (generalized): Secondary | ICD-10-CM

## 2019-02-13 DIAGNOSIS — M25511 Pain in right shoulder: Secondary | ICD-10-CM

## 2019-02-13 DIAGNOSIS — M25512 Pain in left shoulder: Secondary | ICD-10-CM

## 2019-02-13 NOTE — Therapy (Signed)
[facility] [address] [place], [place], [address] Phone: [phone]   Fax:  [phone]  Physical Therapy Treatment  Patient Details  Name: Patricia Johnston MRN: [id number] Date of Birth: 07/05/1950 Referring Provider (PT): Lujean Amel, MD   Encounter Date: 02/13/2019  PT End of Session - 02/13/19 1455    Visit Number  5    Date for PT Re-Evaluation  03/06/19    PT Start Time  [id number]    PT Stop Time  [id number]   had to leave early   PT Time Calculation (min)  31 min    Activity Tolerance  Patient tolerated treatment well    Behavior During Therapy  [facility] for tasks assessed/performed       Past Medical History:  Diagnosis Date  . Asthma   . COPD (chronic obstructive pulmonary disease) ([place])    pt states due to [place] contracted COPD  . Eczema   . Esophageal thickening 10/13/2018   on CT- [facility]  . GERD (gastroesophageal reflux disease)   . Osteoporosis   . Pulmonary emphysema ([place])   . Pulmonary nodules   . Traumatic arthritis   . Umbilical hernia     Past Surgical History:  Procedure Laterality Date  . ABDOMINAL HYSTERECTOMY    . bunionectomy    . COLONOSCOPY     due 2022 or 2023 per patient    There were no vitals filed for this visit.  Subjective Assessment - 02/13/19 1451    Subjective  The patch didn't help the pain stay away.    Patient Stated Goals  reaching up and to the side without, return to regular arm exercises    Currently in Pain?  Yes    Pain Score  8    4/10 on the Rt, 8 on the left   Pain Location  Shoulder    Pain Orientation  Left;Right    Pain Descriptors / Indicators  Aching    Pain Type  Acute pain    Pain Radiating Towards  anterior deltoid    Pain Onset  More than a month ago    Pain Frequency  Intermittent    Multiple Pain Sites  No                       [facility] Adult PT Treatment/Exercise - 02/13/19 0001       Shoulder Exercises: Supine   Flexion  Strengthening;Left;5 reps   discontinued after experiencing increased pain   Flexion Limitations  press up to the ceiling    Other Supine Exercises  circles 2x10 each way holding 2lb weight      Shoulder Exercises: Seated   Other Seated Exercises  UE ranger forward flexion and abduction - 20x each      Shoulder Exercises: ROM/Strengthening   UBE (Upper Arm Bike)  L2 3x3 with PTA present       Manual Therapy   Manual Therapy  Soft tissue mobilization    Soft tissue mobilization  anterior deltoid and gentle AP mobs to Lt shoulder             PT Education - 02/13/19 1519    Education Details  HEP: add circles both ways in supine    Methods  Explanation;Demonstration;Verbal cues    Comprehension  Verbalized understanding;Returned demonstration       PT Short Term Goals - 02/08/19 1559      PT SHORT TERM GOAL #  1   Title  Patient will dmeostrate full pain free UE AROM    Baseline  still has painful arc Lt>Rt    Status  On-going        PT Long Term Goals - 01/31/19 1415      PT LONG TERM GOAL #1   Title  ind with advanced HEP    Time  8    Period  Weeks    Status  New    Target Date  03/27/19      PT LONG TERM GOAL #2   Title  FOTO < or = to 35% limited    Time  8    Period  Weeks    Status  New    Target Date  03/27/19      PT LONG TERM GOAL #3   Title  bilateral shoulder flexion and abduction 5/5 and pain free    Time  8    Period  Weeks    Status  New    Target Date  03/27/19      PT LONG TERM GOAL #4   Title  Pt able to do her normal exercises safely and without pain    Time  8    Period  Weeks    Status  New    Target Date  03/27/19      PT LONG TERM GOAL #5   Title  Pt will be able to put dishes away and do house work without pain afterwards    Time  8    Period  Weeks    Status  New    Target Date  03/27/19            Plan - 02/13/19 1520    Clinical Impression Statement  Pt did well with  exercises. She needed some cues to keep shoulder down during flexion and abduction.  Pt was educated on taking a bbreak from abduction due to reports of increased sharp pains.  She did well using the upper extremity ranger for forward reaching which she reports she has the most pain with. Pt had muscle spasms that released with STM to anterior deltoid.  Pt had to leave early due to another appointment she was trying to get to.  She will benefit fom skilled PT to continue working on shoulder stabilty with soft tissue release.    PT Treatment/Interventions  ADLs/Self Care Home Management;Cryotherapy;Electrical Stimulation;Iontophoresis 4mg /ml Dexamethasone;Moist Heat;Therapeutic exercise;Therapeutic activities;Neuromuscular re-education;Patient/family education;Manual techniques;Dry needling;Taping    PT Next Visit Plan  Scap an RTC stabillzation and strength, STM to anterior deltoid    Consulted and Agree with Plan of Care  Patient       Patient will benefit from skilled therapeutic intervention in order to improve the following deficits and impairments:  Pain, Increased muscle spasms, Decreased strength, Decreased range of motion  Visit Diagnosis: Acute pain of left shoulder  Muscle weakness (generalized)  Acute pain of right shoulder     Problem List Patient Active Problem List   Diagnosis Date Noted  . Acute and chronic respiratory failure with hypoxia ([place]) 05/22/2016  . Bronchiectasis without complication ([place]) [id number]  . Elevated IgE level 11/14/2015  . Eosinophilia 11/14/2015  . COPD with acute exacerbation ([place]) 04/19/2015  . Feeling of sadness 04/19/2015    Jule Ser, PT 02/13/2019, 4:31 PM  [facility] [address] [place], [place], [address] Phone: [phone]   Fax:  [phone]  Name: Patricia Johnston  [place] MRN: [id number] Date of Birth: [birth year]

## 2019-02-15 ENCOUNTER — Ambulatory Visit: Payer: Medicare Other | Admitting: Physical Therapy

## 2019-02-17 ENCOUNTER — Encounter: Payer: Medicare Other | Admitting: Physical Therapy

## 2019-02-20 ENCOUNTER — Encounter: Payer: Self-pay | Admitting: Physical Therapy

## 2019-02-20 ENCOUNTER — Other Ambulatory Visit: Payer: Self-pay

## 2019-02-20 ENCOUNTER — Ambulatory Visit: Payer: Medicare Other | Admitting: Physical Therapy

## 2019-02-20 DIAGNOSIS — M25512 Pain in left shoulder: Secondary | ICD-10-CM | POA: Diagnosis not present

## 2019-02-20 DIAGNOSIS — M6281 Muscle weakness (generalized): Secondary | ICD-10-CM

## 2019-02-20 DIAGNOSIS — M25511 Pain in right shoulder: Secondary | ICD-10-CM

## 2019-02-20 NOTE — Therapy (Signed)
[facility] [address], [place], [place], [address] Phone: [phone]   Fax:  [phone]  Physical Therapy Treatment  Patient Details  Name: Patricia Johnston MRN: [id number] Date of Birth: [birth year] Referring Provider (PT): Lujean Amel, MD   Encounter Date: 02/20/2019  PT End of Session - 02/20/19 1453    Visit Number  6    Date for PT Re-Evaluation  03/06/19    PT Start Time  [id number]    PT Stop Time  1527    PT Time Calculation (min)  40 min    Activity Tolerance  Patient tolerated treatment well    Behavior During Therapy  [facility] for tasks assessed/performed       Past Medical History:  Diagnosis Date  . Asthma   . COPD (chronic obstructive pulmonary disease) ([place])    pt states due to [place] contracted COPD  . Eczema   . Esophageal thickening 10/13/2018   on CT- [facility]  . GERD (gastroesophageal reflux disease)   . Osteoporosis   . Pulmonary emphysema ([place])   . Pulmonary nodules   . Traumatic arthritis   . Umbilical hernia     Past Surgical History:  Procedure Laterality Date  . ABDOMINAL HYSTERECTOMY    . bunionectomy    . COLONOSCOPY     due 2022 or 2023 per patient    There were no vitals filed for this visit.  Subjective Assessment - 02/20/19 1455    Subjective  I can feel the improvement a little bit.  Moving still hurts in certain ways but not quite as sharp.    Currently in Pain?  Yes    Pain Score  7     Pain Location  Shoulder    Pain Orientation  Left    Multiple Pain Sites  No                       [facility] Adult PT Treatment/Exercise - 02/20/19 0001      Shoulder Exercises: Seated   Other Seated Exercises  UE ranger forward flexion and scaption - 20x each      Shoulder Exercises: Standing   External Rotation  Strengthening;Right;Theraband;Left;15 reps   isometric side step   Theraband Level (Shoulder External Rotation)  Level  2 (Red)      Shoulder Exercises: ROM/Strengthening   UBE (Upper Arm Bike)  L2 3x3 with PT present     Other ROM/Strengthening Exercises  abduction and flexion on step with ranger pain free range and cues to keep shoulders relaxed      Shoulder Exercises: Stretch   Table Stretch - External Rotation  5 reps;10 seconds      Manual Therapy   Soft tissue mobilization  anterior deltoid and gentle AP mobs to Lt shoulder               PT Short Term Goals - 02/08/19 1559      PT SHORT TERM GOAL #1   Title  Patient will dmeostrate full pain free UE AROM    Baseline  still has painful arc Lt>Rt    Status  On-going        PT Long Term Goals - 01/31/19 1415      PT LONG TERM GOAL #1   Title  ind with advanced HEP    Time  8    Period  Weeks    Status  New    Target  Date  03/27/19      PT LONG TERM GOAL #2   Title  FOTO < or = to 35% limited    Time  8    Period  Weeks    Status  New    Target Date  03/27/19      PT LONG TERM GOAL #3   Title  bilateral shoulder flexion and abduction 5/5 and pain free    Time  8    Period  Weeks    Status  New    Target Date  03/27/19      PT LONG TERM GOAL #4   Title  Pt able to do her normal exercises safely and without pain    Time  8    Period  Weeks    Status  New    Target Date  03/27/19      PT LONG TERM GOAL #5   Title  Pt will be able to put dishes away and do house work without pain afterwards    Time  8    Period  Weeks    Status  New    Target Date  03/27/19            Plan - 02/20/19 1549    Clinical Impression Statement  Pt reports a change in pain today and feeling less since previous session.  Pt was cued for ROM to be more limited with forward flexion and abduction in order to perform in a pain free range.  Pt was monitored for pain and had no increased pain after treatment today.  She responded well from [facility] to anerior deltoid and pecs on left side.  Pt was educated in passive ER stetch on table but was  not able to add to medbridge due to website issue.  Pt will benefit from skilled PT to continue progressing towards achieving all functional goals.    PT Treatment/Interventions  ADLs/Self Care Home Management;Cryotherapy;Electrical Stimulation;Iontophoresis 4mg /ml Dexamethasone;Moist Heat;Therapeutic exercise;Therapeutic activities;Neuromuscular re-education;Patient/family education;Manual techniques;Dry needling;Taping    PT Next Visit Plan  progress scap an RTC stabillzation and strength as tolerated, STM to anterior deltoid    PT Home Exercise Plan  Access Code: [email or web address]    Consulted and Agree with Plan of Care  Patient       Patient will benefit from skilled therapeutic intervention in order to improve the following deficits and impairments:  Pain, Increased muscle spasms, Decreased strength, Decreased range of motion  Visit Diagnosis: Muscle weakness (generalized)  Acute pain of left shoulder  Acute pain of right shoulder     Problem List Patient Active Problem List   Diagnosis Date Noted  . Acute and chronic respiratory failure with hypoxia ([place]) 05/22/2016  . Bronchiectasis without complication ([place]) [id number]  . Elevated IgE level 11/14/2015  . Eosinophilia 11/14/2015  . COPD with acute exacerbation ([place]) 04/19/2015  . Feeling of sadness 04/19/2015    Jule Ser, PT 02/20/2019, 4:31 PM  [place] [address], [place], [place], [address] Phone: [phone]   Fax:  [phone]  Name: Patricia Johnston MRN: [id number] Date of Birth: [birth year]

## 2019-02-22 ENCOUNTER — Ambulatory Visit: Payer: Medicare Other | Admitting: Physical Therapy

## 2019-02-27 ENCOUNTER — Ambulatory Visit: Payer: Medicare Other | Admitting: Physical Therapy

## 2019-03-01 ENCOUNTER — Ambulatory Visit: Payer: Medicare Other | Admitting: Physical Therapy

## 2019-03-08 ENCOUNTER — Other Ambulatory Visit: Payer: Self-pay

## 2019-03-08 ENCOUNTER — Ambulatory Visit: Payer: Medicare Other | Attending: Family Medicine | Admitting: Physical Therapy

## 2019-03-08 ENCOUNTER — Encounter: Payer: Self-pay | Admitting: Physical Therapy

## 2019-03-08 DIAGNOSIS — M25511 Pain in right shoulder: Secondary | ICD-10-CM

## 2019-03-08 DIAGNOSIS — M6281 Muscle weakness (generalized): Secondary | ICD-10-CM | POA: Insufficient documentation

## 2019-03-08 DIAGNOSIS — M25512 Pain in left shoulder: Secondary | ICD-10-CM | POA: Diagnosis not present

## 2019-03-08 NOTE — Therapy (Signed)
[facility] [address], [place], [place], [address] Phone: [phone]   Fax:  [phone]  Physical Therapy Treatment  Patient Details  Name: Patricia Johnston MRN: [id number] Date of Birth: 13-Jun-1950 Referring Provider (PT): Lujean Amel, MD   Encounter Date: 03/08/2019  PT End of Session - 03/08/19 1453    Visit Number  7    Date for PT Re-Evaluation  03/08/19    PT Start Time  4765    PT Stop Time  1522    PT Time Calculation (min)  35 min    Activity Tolerance  Patient tolerated treatment well    Behavior During Therapy  [facility] for tasks assessed/performed       Past Medical History:  Diagnosis Date  . Asthma   . COPD (chronic obstructive pulmonary disease) ([place])    pt states due to [place] contracted COPD  . Eczema   . Esophageal thickening 10/13/2018   on CT- [facility]  . GERD (gastroesophageal reflux disease)   . Osteoporosis   . Pulmonary emphysema ([place])   . Pulmonary nodules   . Traumatic arthritis   . Umbilical hernia     Past Surgical History:  Procedure Laterality Date  . ABDOMINAL HYSTERECTOMY    . bunionectomy    . COLONOSCOPY     due 2022 or 2023 per patient    There were no vitals filed for this visit.  Subjective Assessment - 03/08/19 1458    Subjective  Pt states she had no problems over the weekend very minimal pain at end range today    Patient Stated Goals  reaching up and to the side without, return to regular arm exercises    Currently in Pain?  No/denies         [facility] PT Assessment - 03/08/19 0001      Assessment   Medical Diagnosis  M25.511 (ICD-10-CM) - Pain in right shoulder; M25.512 (ICD-10-CM) - Pain in left shoulder    Referring Provider (PT)  Lujean Amel, MD                   [facility] Adult PT Treatment/Exercise - 03/08/19 0001      Shoulder Exercises: Therapy Ball   Other Therapy Ball Exercises  small ball - ROM  on the wall 4 ways - 20x each      Shoulder Exercises: ROM/Strengthening   UBE (Upper Arm Bike)  L2 3x3 with PT present       Shoulder Exercises: Body Blade   Flexion Limitations  horizontal - bilat flexion               PT Short Term Goals - 03/08/19 1459      PT SHORT TERM GOAL #1   Title  Patient will dmeostrate full pain free UE AROM    Status  Achieved        PT Long Term Goals - 03/08/19 1500      PT LONG TERM GOAL #1   Title  ind with advanced HEP    Status  Achieved      PT LONG TERM GOAL #2   Title  FOTO < or = to 35% limited    Baseline  26%    Status  Achieved      PT LONG TERM GOAL #3   Title  bilateral shoulder flexion and abduction 5/5 and pain free    Baseline  5/5 MMT    Status  Achieved      PT LONG TERM GOAL #4   Title  Pt able to do her normal exercises safely and without pain    Baseline  still avoiding the hoola hoop, but building up to normal and didn't have pain    Status  Partially Met      PT LONG TERM GOAL #5   Title  Pt will be able to put dishes away and do house work without pain afterwards    Status  Achieved            Plan - 03/08/19 1530    Clinical Impression Statement  Pt has met almost all of her goals.  She is only 26% limited according to [place] and exceeding her goal of 35% limtied.  pt has returned to all functional activities relatively pain free.  She has HEP and is independent with exercises for d/c from PT today    PT Treatment/Interventions  ADLs/Self Care Home Management;Cryotherapy;Electrical Stimulation;Iontophoresis 24m/ml Dexamethasone;Moist Heat;Therapeutic exercise;Therapeutic activities;Neuromuscular re-education;Patient/family education;Manual techniques;Dry needling;Taping       Patient will benefit from skilled therapeutic intervention in order to improve the following deficits and impairments:  Pain, Increased muscle spasms, Decreased strength, Decreased range of motion  Visit  Diagnosis: Muscle weakness (generalized)  Acute pain of left shoulder  Acute pain of right shoulder     Problem List Patient Active Problem List   Diagnosis Date Noted  . Acute and chronic respiratory failure with hypoxia ([place] 05/22/2016  . Bronchiectasis without complication ([place] 015/83/0940 . Elevated IgE level 11/14/2015  . Eosinophilia 11/14/2015  . COPD with acute exacerbation ([place] 04/19/2015  . Feeling of sadness 04/19/2015    JJule Ser1/08/2019, 5:06 PM  [facility] [address] [place] [address]: [phone]  Fax:  [phone] Name: Patricia GuintherMRN: [id number] of Birth: [birth year]PHYSICAL THERAPY DISCHARGE SUMMARY  Visits from Start of Care: 7  Current functional level related to goals / functional outcomes: See above   Remaining deficits: See above   Education / Equipment: HEP  Plan: Patient agrees to discharge.  Patient goals were met. Patient is being discharged due to meeting the stated rehab goals.  ?????     [employer] PT 03/08/19 5:11 PM

## 2019-03-16 ENCOUNTER — Ambulatory Visit: Payer: Medicare Other

## 2019-06-08 DIAGNOSIS — H6091 Unspecified otitis externa, right ear: Secondary | ICD-10-CM | POA: Diagnosis not present

## 2019-06-08 DIAGNOSIS — H6120 Impacted cerumen, unspecified ear: Secondary | ICD-10-CM | POA: Diagnosis not present

## 2019-06-08 DIAGNOSIS — H9201 Otalgia, right ear: Secondary | ICD-10-CM | POA: Diagnosis not present

## 2019-06-10 ENCOUNTER — Other Ambulatory Visit: Payer: Self-pay

## 2019-06-10 ENCOUNTER — Encounter (HOSPITAL_COMMUNITY): Payer: Self-pay | Admitting: *Deleted

## 2019-06-10 ENCOUNTER — Emergency Department (HOSPITAL_COMMUNITY)
Admission: EM | Admit: 2019-06-10 | Discharge: 2019-06-10 | Disposition: A | Discharge status: Home / Self Care | Payer: No Typology Code available for payment source | Attending: Emergency Medicine | Admitting: Emergency Medicine

## 2019-06-10 DIAGNOSIS — H9201 Otalgia, right ear: Secondary | ICD-10-CM | POA: Diagnosis not present

## 2019-06-10 DIAGNOSIS — J449 Chronic obstructive pulmonary disease, unspecified: Secondary | ICD-10-CM | POA: Insufficient documentation

## 2019-06-10 DIAGNOSIS — Z79899 Other long term (current) drug therapy: Secondary | ICD-10-CM | POA: Insufficient documentation

## 2019-06-10 DIAGNOSIS — H9209 Otalgia, unspecified ear: Secondary | ICD-10-CM

## 2019-06-10 MED ORDER — AMOXICILLIN-POT CLAVULANATE 875-125 MG PO TABS: 1.0000 | ORAL_TABLET | Freq: Two times a day (BID) | ORAL | 0 refills | Status: DC

## 2019-06-10 MED ORDER — OFLOXACIN 0.3 % OT SOLN: 10.0000 [drp] | Freq: Every day | OTIC | 0 refills | Status: AC

## 2019-06-10 NOTE — ED Provider Notes (Signed)
[facility] DEPT Provider Note   CSN: [id number] Arrival date & time: 06/10/19  1249     History Chief Complaint  Patient presents with  . Otalgia    Patricia Johnston is a 69 y.o. female.  HPI   69 year old female with history of asthma, COPD, eczema, esophageal thickening, GERD, osteoporosis, pulmonary emphysema, pulmonary nodules, presents emergency department today for evaluation of right ear pain.  States the pain started 5 days ago and has been constant since onset.  Pain is worsened since onset.  She went to [facility] walk[facility]in [facility] a few days ago and states that she had her ear irrigated.  States that since then her pain has been worse.  She has no changes in her hearing but states that it feels like her ear is underwater.  She states she has tried putting oil in it, hydrogen peroxide and is also tried Debrox drops without relief.  She denies any fevers or other URI symptoms.  Past Medical History:  Diagnosis Date  . Asthma   . COPD (chronic obstructive pulmonary disease) ([place])    pt states due to [place] contracted COPD  . Eczema   . Esophageal thickening 10/13/2018   on CT- [facility]  . GERD (gastroesophageal reflux disease)   . Osteoporosis   . Pulmonary emphysema ([place])   . Pulmonary nodules   . Traumatic arthritis   . Umbilical hernia     Patient Active Problem List   Diagnosis Date Noted  . Acute and chronic respiratory failure with hypoxia ([place]) 05/22/2016  . Bronchiectasis without complication ([place]) [id number]  . Elevated IgE level 11/14/2015  . Eosinophilia 11/14/2015  . COPD with acute exacerbation ([place]) 04/19/2015  . Feeling of sadness 04/19/2015    Past Surgical History:  Procedure Laterality Date  . ABDOMINAL HYSTERECTOMY    . bunionectomy    . COLONOSCOPY     due 2022 or 2023 per patient     OB History   No obstetric history on file.     Family History  Problem Relation Age of  Onset  . Colon cancer Neg Hx   . Esophageal cancer Neg Hx     Social History   Tobacco Use  . Smoking status: Never Smoker  . Smokeless tobacco: Never Used  Substance Use Topics  . Alcohol use: Yes    Alcohol/week: 0.0 standard drinks    Comment: ocassionally  . Drug use: No    Home Medications Prior to Admission medications   Medication Sig Start Date End Date Taking? Authorizing Provider  albuterol (PROVENTIL) (2.5 MG/3ML) 0.083% nebulizer solution Take 2.5 mg by nebulization as needed for wheezing or shortness of breath.    [provider]  albuterol (VENTOLIN HFA) 108 (90 Base) MCG/ACT inhaler Inhale 2 puffs into the lungs every 4 (four) hours as needed for wheezing or shortness of breath. 06/25/18   Lajean Saver, MD  amoxicillin-clavulanate (AUGMENTIN) 875-125 MG tablet Take 1 tablet by mouth every 12 (twelve) hours. 06/10/19   Ingri Diemer S, PA-C  Calcium Carb-Cholecalciferol (CALCIUM 500+D PO) Take 1 tablet by mouth daily.    [provider]  diphenhydrAMINE (BENADRYL) 25 MG tablet Take 25 mg by mouth every 6 (six) hours as needed (anxiety).    [provider]  ELDERBERRY PO Take 1 tablet by mouth daily.    [provider]  feeding supplement, ENSURE ENLIVE, (ENSURE ENLIVE) LIQD Take 237 mLs by mouth 2 (two) times daily between meals. Patient  taking differently: Take 237 mLs by mouth as needed.  05/26/16   Florencia Reasons, MD  guaiFENesin (MUCINEX) 600 MG 12 hr tablet Take 1 tablet (600 mg total) by mouth 2 (two) times daily. Patient taking differently: Take 600 mg by mouth as needed.  05/26/16   Florencia Reasons, MD  mometasone [facility]) 220 MCG/INH inhaler Inhale 2 puffs into the lungs daily.    [provider]  ofloxacin (FLOXIN) 0.3 % OTIC solution Place 10 drops into the right ear daily for 7 days. 06/10/19 06/17/19  Sarinity Dicicco S, PA-C  OXYGEN Inhale 2 L into the lungs daily as needed (for exertion). Reported on 04/19/2015    [provider]  predniSONE (DELTASONE) 20 MG tablet Take 3 tablets (60 mg total) by mouth daily. Patient not taking: Reported on 12/05/2018 06/26/18   Lajean Saver, MD  saccharomyces boulardii (FLORASTOR) 250 MG capsule Take 1 capsule (250 mg total) by mouth 2 (two) times daily. Patient not taking: Reported on 06/16/2017 05/26/16   Florencia Reasons, MD  Tiotropium Bromide-Olodaterol (STIOLTO RESPIMAT) 2.5-2.5 MCG/ACT AERS Inhale 2 puffs into the lungs daily.    [provider]    Allergies    Flonase [fluticasone propionate] and Nasacort [triamcinolone]  Review of Systems   Review of Systems  Constitutional: Negative for fever.  HENT:       Right ear pain    Physical Exam Updated Vital Signs BP (!) 145/81 (BP Location: Right Arm)   Pulse 72   Temp 97.9 F (36.6 C) (Oral)   Resp 15   SpO2 94%   Physical Exam Vitals and nursing note reviewed.  Constitutional:      General: She is not in acute distress.    Appearance: She is well-developed.  HENT:     Head: Normocephalic and atraumatic.     Ears:     Comments: Right TM is partially obstructed by edema to the canal and what is likely cerumen. There is some debris in the canal consistent with otitis external. No TTP to the mastoid. Eyes:     Conjunctiva/sclera: Conjunctivae normal.  Cardiovascular:     Rate and Rhythm: Normal rate.  Pulmonary:     Effort: Pulmonary effort is normal.  Musculoskeletal:        General: Normal range of motion.     Cervical back: Neck supple.  Skin:    General: Skin is warm and dry.  Neurological:     Mental Status: She is alert.     Comments: Clear speech     ED Results / Procedures / Treatments   Labs (all labs ordered are listed, but only abnormal results are displayed) Labs Reviewed - No data to display  EKG None  Radiology No results found.  Procedures Procedures (including critical care time)  Medications Ordered in ED Medications - No data to display  ED Course  I  have reviewed the triage vital signs and the nursing notes.  Pertinent labs & imaging results that were available during my care of the patient were reviewed by me and considered in my medical decision making (see chart for details).    MDM Rules/Calculators/A&P                      Pt presenting with otitis externa. No canal occlusion, Pt afebrile in NAD. Exam non concerning for mastoiditis, cellulitis or malignant OE.  Dc with ofloxacin script.  Given age, comorbidities, and duration of sxs, will also cover  with oral abx for possible otitis media.  Will give follow-up with ENT.  Advised on return precautions.  She voiced understanding plan reasons to return.  All questions answered.  Patient stable for discharge.   Final Clinical Impression(s) / ED Diagnoses Final diagnoses:  Otalgia, unspecified laterality    Rx / DC Orders ED Discharge Orders         Ordered    ofloxacin (FLOXIN) 0.3 % OTIC solution  Daily     06/10/19 1338    amoxicillin-clavulanate (AUGMENTIN) 875-125 MG tablet  Every 12 hours     06/10/19 1338           Domenique Quest S, PA-C 06/10/19 1340    Carmin Muskrat, MD 06/10/19 1348

## 2019-06-10 NOTE — ED Triage Notes (Signed)
Pt complains of pain in right ear x 4 days. She went to [facility] walk in clinic 2 days ago, had ears cleaned but felt worse after and ear is now sensitive to touch.

## 2019-06-10 NOTE — Discharge Instructions (Signed)
You can continue using the Debrox.  You were also given a prescription for oral and topical antibiotic drops for your ear.  Please use as directed.  Do not put anything else in your ear such as oil or hydrogen peroxide.  Try to keep the ear dry if possible when showering or bathing.  You were given information to follow-up with an ear nose and throat doctor.  Please call the office schedule appointment for follow-up.  Please return the emergency department for new or worsening symptoms including fevers, worsening/persistent pain or any swelling behind the ear.

## 2019-06-12 DIAGNOSIS — H6121 Impacted cerumen, right ear: Secondary | ICD-10-CM | POA: Diagnosis not present

## 2019-06-12 DIAGNOSIS — H60331 Swimmer's ear, right ear: Secondary | ICD-10-CM | POA: Diagnosis not present

## 2019-06-20 ENCOUNTER — Encounter: Payer: Self-pay | Admitting: Allergy and Immunology

## 2019-06-20 ENCOUNTER — Telehealth: Payer: Self-pay | Admitting: Allergy and Immunology

## 2019-06-20 ENCOUNTER — Ambulatory Visit (INDEPENDENT_AMBULATORY_CARE_PROVIDER_SITE_OTHER): Payer: Medicare Other | Admitting: Allergy and Immunology

## 2019-06-20 ENCOUNTER — Other Ambulatory Visit: Payer: Self-pay

## 2019-06-20 ENCOUNTER — Ambulatory Visit: Payer: Medicare Other

## 2019-06-20 VITALS — BP 110/68 | HR 79 | Temp 97.5°F | Resp 16 | Ht 64.0 in | Wt 130.8 lb

## 2019-06-20 DIAGNOSIS — R768 Other specified abnormal immunological findings in serum: Secondary | ICD-10-CM | POA: Diagnosis not present

## 2019-06-20 DIAGNOSIS — B4481 Allergic bronchopulmonary aspergillosis: Secondary | ICD-10-CM

## 2019-06-20 DIAGNOSIS — J4550 Severe persistent asthma, uncomplicated: Secondary | ICD-10-CM

## 2019-06-20 DIAGNOSIS — L308 Other specified dermatitis: Secondary | ICD-10-CM

## 2019-06-20 DIAGNOSIS — D7219 Other eosinophilia: Secondary | ICD-10-CM

## 2019-06-20 DIAGNOSIS — H60331 Swimmer's ear, right ear: Secondary | ICD-10-CM | POA: Diagnosis not present

## 2019-06-20 DIAGNOSIS — L989 Disorder of the skin and subcutaneous tissue, unspecified: Secondary | ICD-10-CM

## 2019-06-20 DIAGNOSIS — R430 Anosmia: Secondary | ICD-10-CM | POA: Diagnosis not present

## 2019-06-20 NOTE — Telephone Encounter (Signed)
Called and spoke with the patient and she has been rescheduled to the second week in May to see if this will giver her time for her ear to heal. Nucala information has been mailed to her home for her to look over. Patient verbalized understanding.

## 2019-06-20 NOTE — Telephone Encounter (Signed)
Patient called and states she is not going to be able to come back to the office today to get a Nucala injection. Patient states that after her ear appointment she cannot come back as she is in pain and her ear is bleeding. She has to go back to the ear doctor on Friday and does not want all this in her body right now.

## 2019-06-20 NOTE — Progress Notes (Addendum)
[place] [facility] [place] [facility] [place]   NEW PATIENT NOTE  Referring Provider: Doreene Adas* Primary Provider: Wallie Char, FNP Date of office visit: 06/20/2019    Subjective:   Chief Complaint:  Patricia Johnston (DOB: Apr 25, 1950) is a 69 y.o. female who presents to the clinic on 06/20/2019 with a chief complaint of Asthma .  HPI: Patricia Johnston presents to this clinic in evaluation of asthma.  She states that her asthma started in 2000 after being exposed to a moldy environment for a prolonged period in time.  Her asthma is manifested as recurrent coughing and wheezing and shortness of breath and she must use a bronchodilator twice a day and she cannot really exercise because of this issue.  She does have nocturnal bronchospastic symptoms a few times per week.  This occurs even though she is on two different controller agents for this issue.  She states that in the past she utilized a large amount of prednisone to treat this issue but over the course of the past year she has not received any prednisone.  Sometimes her sputum gets a little bit yellow and she will be treated with an antibiotic usually around twice a year.  In addition to this asthma issues she also has unrelenting coughing spells in the morning that last 15 minutes associated with gagging.  She feels as though something is caught in her throat and she has throat clearing.  She does have a history of reflux that is asymptomatic that has been identified on a barium swallow.  She does drink 1 coffee per day.  She does not treat reflux at this point.  She cannot smell at all since 2000.  She has this issue with intermittent itchiness of her face that appears to occur on a daily basis usually involving her cheeks that she will excoriate.  This is been going on a few years now.  Usually this itchiness will last about 10 minutes or so and it can occur at anytime of the day.  In addition, she  apparently had some type of acute right ear pain that is being followed by [facility] ear nose and throat.  This appears to be an active process.  Past Medical History:  Diagnosis Date  . Asthma   . COPD (chronic obstructive pulmonary disease) ([place])    pt states due to [place] contracted COPD  . Eczema   . Esophageal thickening 10/13/2018   on CT- [facility]  . GERD (gastroesophageal reflux disease)   . Osteoporosis   . Pulmonary emphysema ([place])   . Pulmonary nodules   . Traumatic arthritis   . Umbilical hernia     Past Surgical History:  Procedure Laterality Date  . ABDOMINAL HYSTERECTOMY    . bunionectomy    . COLONOSCOPY     due 2022 or 2023 per patient    Allergies as of 06/20/2019      Reactions   Flonase [fluticasone Propionate] Other (See Comments)   Nasal bleeding   Nasacort [triamcinolone] Other (See Comments)   Nasal bleeding      Medication List      albuterol (2.5 MG/3ML) 0.083% nebulizer solution Commonly known as: PROVENTIL Take 2.5 mg by nebulization as needed for wheezing or shortness of breath.   albuterol 108 (90 Base) MCG/ACT inhaler Commonly known as: VENTOLIN HFA Inhale 2 puffs into the lungs every 4 (four) hours as needed for wheezing or shortness of breath.  amoxicillin-clavulanate 875-125 MG tablet Commonly known as: AUGMENTIN Take 1 tablet by mouth every 12 (twelve) hours.   CALCIUM 500+D PO Take 1 tablet by mouth daily.   diphenhydrAMINE 25 MG tablet Commonly known as: BENADRYL Take 25 mg by mouth every 6 (six) hours as needed (anxiety).   ELDERBERRY PO Take 1 tablet by mouth daily.   feeding supplement (ENSURE ENLIVE) Liqd Take 237 mLs by mouth 2 (two) times daily between meals.   guaiFENesin 600 MG 12 hr tablet Commonly known as: MUCINEX Take 1 tablet (600 mg total) by mouth 2 (two) times daily.   mometasone 220 MCG/INH inhaler Commonly known as: ASMANEX Inhale 2 puffs into the lungs daily.    OXYGEN Inhale 2 L into the lungs daily as needed (for exertion). Reported on 04/19/2015   predniSONE 20 MG tablet Commonly known as: DELTASONE Take 3 tablets (60 mg total) by mouth daily.   saccharomyces boulardii 250 MG capsule Commonly known as: FLORASTOR Take 1 capsule (250 mg total) by mouth 2 (two) times daily.   Stiolto Respimat 2.5-2.5 MCG/ACT Aers Generic drug: Tiotropium Bromide-Olodaterol Inhale 2 puffs into the lungs daily.       Review of systems negative except as noted in HPI / PMHx or noted below:  Review of Systems  Constitutional: Negative.   HENT: Negative.   Eyes: Negative.   Respiratory: Negative.   Cardiovascular: Negative.   Gastrointestinal: Negative.   Genitourinary: Negative.   Musculoskeletal: Negative.   Skin: Negative.   Neurological: Negative.   Endo/Heme/Allergies: Negative.   Psychiatric/Behavioral: Negative.     Family History  Problem Relation Age of Onset  . Colon cancer Neg Hx   . Esophageal cancer Neg Hx     Social History   Socioeconomic History  . Marital status: Single    Spouse name: Not on file  . Number of children: Not on file  . Years of education: Not on file  . Highest education level: Not on file  Occupational History  . Occupation: retired  Tobacco Use  . Smoking status: Never Smoker  . Smokeless tobacco: Never Used  Substance and Sexual Activity  . Alcohol use: Yes    Alcohol/week: 0.0 standard drinks    Comment: ocassionally  . Drug use: No  . Sexual activity: Not on file  Other Topics Concern  . Not on file  Social History Narrative  . Not on file    Environmental and Social history  Lives in a house with a dry environment, dog located inside the household, no carpet in the bedroom, plastic on the bed, plastic on the pillow, no smoking ongoing with inside the household.  Objective:   Vitals:   06/20/19 1006  BP: 110/68  Pulse: 79  Resp: 16  Temp: (!) 97.5 F (36.4 C)  SpO2: 92%    Height: 5\' 4"  (162.6 cm) Weight: 130 lb 12.8 oz (59.3 kg)  Physical Exam Constitutional:      Appearance: She is not diaphoretic.     Comments: Raspy voice, throat clearing  HENT:     Head: Normocephalic. No right periorbital erythema or left periorbital erythema.     Right Ear: Tympanic membrane, ear canal and external ear normal.     Left Ear: Tympanic membrane, ear canal and external ear normal.     Nose: Nose normal. No mucosal edema or rhinorrhea.     Mouth/Throat:     Pharynx: Uvula midline. No oropharyngeal exudate.  Eyes:     General: Lids  are normal.     Conjunctiva/sclera: Conjunctivae normal.     Pupils: Pupils are equal, round, and reactive to light.  Neck:     Thyroid: No thyromegaly.     Trachea: Trachea normal. No tracheal tenderness or tracheal deviation.  Cardiovascular:     Rate and Rhythm: Normal rate and regular rhythm.     Heart sounds: Normal heart sounds, S1 normal and S2 normal. No murmur.  Pulmonary:     Effort: Pulmonary effort is normal. No respiratory distress.     Breath sounds: Normal breath sounds. No stridor. No wheezing or rales.  Chest:     Chest wall: No tenderness.  Abdominal:     General: There is no distension.     Palpations: Abdomen is soft. There is no mass.     Tenderness: There is no abdominal tenderness. There is no guarding or rebound.  Musculoskeletal:        General: No tenderness.  Lymphadenopathy:     Head:     Right side of head: No tonsillar adenopathy.     Left side of head: No tonsillar adenopathy.     Cervical: No cervical adenopathy.  Skin:    Coloration: Skin is not pale.     Findings: No erythema or rash.     Nails: There is no clubbing.  Neurological:     Mental Status: She is alert.     Diagnostics: Allergy skin tests were performed.  She demonstrated hypersensitivity to Aspergillus and to some degree dog.  Spirometry was performed and demonstrated an FEV1 of 0.88 @ 47 % of predicted. FEV1/FVC =  0.58  The patient had an Asthma Control Test with the following results: ACT Total Score: 8.    Oxygen saturation on room air at rest was 92%.  Oxygen saturation on room air walking up and down the hallway was 90%.  Review of a chest CT scan report dated 13 October 2018 refers to increased mild dilation of the central pulmonary arteries, circumferential wall thickness of the distal esophagus, persistent diffuse mild bilateral bronchial wall thickening with scattered clusters of tree-in-bud nodules and a new right middle lobe collapse and multiple pulmonary nodules which appeared to be stable from a CT scan 27 March 2015.  Results of a chest CT scan obtained 01 February 2019 identified the following:  LUNGS/PLEURA: Patchy groundglass and micronodular infiltrate in the right upper lobe. Focally dilated bronchi. Diffuse bronchial wall thickening throughout the lungs, especially in the lower lobes. Several tiny scattered pulmonary nodules, for example a 2 mm nodule in  the right lower lobe on image 80 and a 2 mm nodule in the left upper lobe on image 40. No pneumothorax.  No abnormal pulmonary masses.  No pleural effusions.  HEART/MEDIASTINUM: Cardiac size is normal. No acute thoracic aortic abnormalities.  No hilar, mediastinal, or axillary lymphadenopathy.  Results of a barium swallow obtained 08 December 2018 identified the following:  No signs of stricture or narrowing. No visible mass. Question of mildly granular esophageal mucosa in the distal esophagus though assessment was limited given limited amount esophageal distension despite effervescent crystal administration.  Single swallow shows intact primary wave with some mild proximal escape of bolus in the cervical esophagus. Some tertiary peristalsis was also demonstrated more vigorous on later swallows.  Reflux was seen to the level of the cervical/thoracic junction.  Upon swallowing a barium tablet passed readily from the  esophagus into the stomach.  Results of lung volumes obtained 26 August 2015  identified TLC 130% predicted, RV 247% predicted.  Results of blood tests obtained August 2020 and forwarded by the [name] refers to absolute eosinophil count of 790, IgE 2691, IgM 29 mg/DL, normal IgG and IgA.   Assessment and Plan:    1. Not well controlled severe persistent asthma   2. 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 Anosmia   4. Other eosinophilia   5. Elevated IgE level   6. Inflammatory dermatosis     1.  Allergen avoidance measures -Aspergillus  2.  Treat and prevent inflammation:   A.  Continue Asmanex and Stiolto  B.  Start OTC Nasacort -2 sprays each nostril 1 time per day  C.  Start mepolizumab autoinjector every 4 weeks  D.  Start metronidazole cream applied to face twice a day  3.  Treat and prevent reflux:   A.  Minimize caffeine as much as possible  B.  Omeprazole 40 mg tablet in a.m.  C.  Famotidine 40 mg tablet in p.m.  4.  If needed:   A.  Albuterol HFA-2 inhalations every 4-6 hours  5.  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  I think she would be best served by starting mepolizumab.  We attempted to give her a dose today but she apparently had a another visit with a medical provider and she had to terminate her visit early.  We will attempt to get her to start that agent as soon as possible.  She also appears to have a component of LPR with significant dysfunction of her mid airway and we will treat her for this condition with the therapy noted above.  Finally, she has some form of inflammatory dermatosis involving her cheeks and we will start her on topical metronidazole.   I will see her back in this clinic in 4 weeks or earlier if there is a problem.  Allena Katz, MD Allergy / Immunology [place]

## 2019-06-20 NOTE — Patient Instructions (Addendum)
  1.  Allergen avoidance measures -Aspergillus  2.  Treat and prevent inflammation:   A.  Continue Asmanex and Stiolto  B.  Start OTC Nasacort -2 sprays each nostril 1 time per day  C.  Start mepolizumab autoinjector every 4 weeks  D.  Start metronidazole cream applied to face twice a day  3.  Treat and prevent reflux:   A.  Minimize caffeine as much as possible  B.  Omeprazole 40 mg tablet in a.m.  C.  Famotidine 40 mg tablet in p.m.  4.  If needed:   A.  Albuterol HFA-2 inhalations every 4-6 hours  5.  Return to clinic in 4 weeks or earlier if problem

## 2019-06-23 DIAGNOSIS — H7441 Polyp of right middle ear: Secondary | ICD-10-CM | POA: Diagnosis not present

## 2019-06-23 DIAGNOSIS — H60311 Diffuse otitis externa, right ear: Secondary | ICD-10-CM | POA: Diagnosis not present

## 2019-06-29 ENCOUNTER — Other Ambulatory Visit: Payer: Self-pay | Admitting: Otolaryngology

## 2019-06-29 NOTE — Progress Notes (Signed)
Reviewed chart with Dr. Ambrose Pancoast Anesthesiologist at [facility]. Pt's surgery scheduled for 07/05/19 needs to be moved to [place]. Notified surgery scheduler at Dr. Trish Mage office.

## 2019-07-01 ENCOUNTER — Other Ambulatory Visit (HOSPITAL_COMMUNITY): Payer: No Typology Code available for payment source

## 2019-07-03 DIAGNOSIS — H7441 Polyp of right middle ear: Secondary | ICD-10-CM | POA: Diagnosis not present

## 2019-07-03 DIAGNOSIS — H60311 Diffuse otitis externa, right ear: Secondary | ICD-10-CM | POA: Diagnosis not present

## 2019-07-04 ENCOUNTER — Other Ambulatory Visit: Payer: Self-pay

## 2019-07-04 ENCOUNTER — Other Ambulatory Visit (HOSPITAL_COMMUNITY)
Admission: RE | Admit: 2019-07-04 | Discharge: 2019-07-04 | Disposition: A | Discharge status: Home / Self Care | Payer: Medicare Other | Source: Ambulatory Visit | Attending: Otolaryngology | Admitting: Otolaryngology

## 2019-07-04 ENCOUNTER — Encounter (HOSPITAL_COMMUNITY): Payer: Self-pay | Admitting: Otolaryngology

## 2019-07-04 DIAGNOSIS — Z01812 Encounter for preprocedural laboratory examination: Secondary | ICD-10-CM | POA: Diagnosis not present

## 2019-07-04 DIAGNOSIS — Z20822 Contact with and (suspected) exposure to covid-19: Secondary | ICD-10-CM | POA: Insufficient documentation

## 2019-07-04 LAB — SARS CORONAVIRUS 2 (TAT 6-24 HRS): SARS Coronavirus 2: NEGATIVE

## 2019-07-04 NOTE — Progress Notes (Signed)
Anesthesia Chart Review: Patricia Johnston ,  Case: [id number] Date/Time: 07/05/19 1000   Procedure: excision of ear canal cyst (Right )   Anesthesia type: General   Pre-op diagnosis: Right ear canal cyst   Location: [facility] OR ROOM 08 / [place] [facility]   Surgeons: Helayne Seminole, MD      DISCUSSION: Patient is a 69 year old female scheduled for the above procedure.  Case moved to the main OR from [facility] per anesthesiologist Oddono, EJ, MD.  History includes never smoker, postoperative N/V, DIFFICULT INTUBATION (> 30 years ago, no details available), COPD/emphysema, asthma, pulmonary nodules, GERD, anxiety, dental implant.   Last seen by allergist Dr. Neldon Mc on 06/20/2019 for not well controlled severe persistent asthma, allergic bronchopulmonary aspergillosis, inflammatory dermatosis.  He recommended starting mepolizumab. See note for details.  4-week follow-up planned.  07/04/2019 presurgical COVID-19 test is in process.  Anesthesia team to evaluate on the day of surgery.  No details available about remote history of difficult intubation.   VS:  BP Readings from Last 3 Encounters:  06/20/19 110/68  06/10/19 (!) 145/81  12/05/18 114/64   Pulse Readings from Last 3 Encounters:  06/20/19 79  06/10/19 72  12/05/18 84    PROVIDERS: Wallie Char, FNP is listed as PCP. She reported PCP as Dr. Annitta Jersey at the [facility]. Humberto Seals, MD is pulmonologist [facility]) Allena Katz, MD is allergist. Notes on 06/20/19 indicate that she cannot smell "at all" since 2000. Pyrtle, J, MD is GI   LABS: For day of surgery   OTHER: Spirometry 06/20/10: FEV1 of 0.88 @ 47 % of predicted. FEV1/FVC = 0.58 Oxygen saturation 06/20/10 on room air at rest was 92%.  Oxygen saturation on room air walking up and down the hallway was 90%   IMAGES: CT Chest 02/01/19 ([facility]): FINDINGS: LUNGS/PLEURA: Patchy groundglass and micronodular infiltrate in the right upper lobe. Focally dilated bronchi. Diffuse  bronchial wall thickening throughout the lungs, especially in the lower lobes. Several tiny scattered pulmonary nodules, for example a 2 mm nodule in  the right lower lobe on image 80 and a 2 mm nodule in the left upper lobe on image 40. No pneumothorax.  No abnormal pulmonary masses.  No pleural effusions. HEART/MEDIASTINUM: Cardiac size is normal. No acute thoracic aortic abnormalities.  No hilar, mediastinal, or axillary lymphadenopathy. MUSCULOSKELETAL: No acute or destructive osseous processes. IMPRESSION: Micronodular infiltrate and focal bronchial dilatation in the right upper lobe suggesting atypical infection. Prior exams would be useful for comparison of these are available. Other scattered tiny pulmonary nodules are also nonspecific, priors would be useful for comparison.  CT Chest 10/13/18 [facility]): Impression: 1.  Persistent diffuse mild bilateral bronchial wall thickening with interval increase in bilateral scattered clusters of tree-in-bud nodules and new right middle lobe collapse.  Findings raise the possibility of chronic infectious bronchitis/bronchiolitis such as from mycobacteria. 2.  Pulmonary nodules noted on 03/27/2015 are stable. 3.  Slight interval increase in fullness of a 12 x 4 mm central right upper lobe opacity posterior to the bronchus intermedius, indeterminate in the setting of the widespread chronic _______ _______ findings. 4.  Increasing mild dilation of the central pulmonary arteries, raising the possibility of pulmonary hypertension. Addendum: Circumferential thickening of the distal esophagus.  This is nonspecific, possibly displaced GE junction or soft tissue lesion.  Recommend further evaluation with esophagram or endoscopy.  Esophogram 01/08/19: IMPRESSION: 1. Mildly limited double contrast assessment with question of granular mucosa/Barrett's esophagus, consider endoscopy for further assessment given patient's  symptoms and severity of  reflux. 2. Esophageal dysmotility.   EKG: Greater than 1 year ago. Last tracing: 06/25/18: SR   CV: N/A (stress test > 10 years ago)  Past Medical History:  Diagnosis Date  . Anxiety   . Asthma   . COPD (chronic obstructive pulmonary disease) ([place])    pt states due to [place] contracted COPD  . Difficult intubation    Pt does not remember details over 30 years ago  . Eczema   . Esophageal thickening 10/13/2018   on CT- [facility]  . GERD (gastroesophageal reflux disease)   . Osteoporosis   . PONV (postoperative nausea and vomiting)   . Pulmonary emphysema ([place])   . Pulmonary nodules   . Traumatic arthritis   . Umbilical hernia   . Wears glasses     Past Surgical History:  Procedure Laterality Date  . ABDOMINAL HYSTERECTOMY    . bunionectomy    . COLONOSCOPY     due 2022 or 2023 per patient  . dental implant      MEDICATIONS: No current facility-administered medications for this encounter.   Marland Kitchen albuterol (PROVENTIL) (2.5 MG/3ML) 0.083% nebulizer solution  . albuterol (VENTOLIN HFA) 108 (90 Base) MCG/ACT inhaler  . alendronate (FOSAMAX) 70 MG tablet  . Calcium Carb-Cholecalciferol (CALCIUM 500+D PO)  . diphenhydrAMINE (BENADRYL) 25 MG tablet  . feeding supplement, ENSURE ENLIVE, (ENSURE ENLIVE) LIQD  . mometasone (ASMANEX) 220 MCG/INH inhaler  . Multiple Vitamins-Minerals (MULTIVITAMIN WITH MINERALS) tablet  . ofloxacin (FLOXIN) 0.3 % OTIC solution  . Tiotropium Bromide-Olodaterol (STIOLTO RESPIMAT) 2.5-2.5 MCG/ACT AERS  . amoxicillin-clavulanate (AUGMENTIN) 875-125 MG tablet  . guaiFENesin (MUCINEX) 600 MG 12 hr tablet    Myra Gianotti, PA-C Surgical Short Stay/Anesthesiology [facility] Phone [phone] [facility] Phone [phone] 07/04/2019 4:59 PM

## 2019-07-04 NOTE — Anesthesia Preprocedure Evaluation (Addendum)
Anesthesia Evaluation  Patient identified by MRN, date of birth, ID band Patient awake    Reviewed: Allergy & Precautions, H&P , NPO status , Patient's Chart, lab work & pertinent test results  History of Anesthesia Complications (+) PONV, DIFFICULT AIRWAY and history of anesthetic complications  Airway Mallampati: II   Neck ROM: full    Dental   Pulmonary asthma , COPD,    breath sounds clear to auscultation       Cardiovascular negative cardio ROS   Rhythm:regular Rate:Normal     Neuro/Psych PSYCHIATRIC DISORDERS Anxiety    GI/Hepatic GERD  ,  Endo/Other    Renal/GU      Musculoskeletal   Abdominal   Peds  Hematology   Anesthesia Other Findings   Reproductive/Obstetrics                             Anesthesia Physical Anesthesia Plan  ASA: II  Anesthesia Plan: General   Post-op Pain Management:    Induction: Intravenous  PONV Risk Score and Plan: 4 or greater and Ondansetron, Dexamethasone, Midazolam and Treatment may vary due to age or medical condition  Airway Management Planned: LMA  Additional Equipment:   Intra-op Plan:   Post-operative Plan: Extubation in OR  Informed Consent: I have reviewed the patients History and Physical, chart, labs and discussed the procedure including the risks, benefits and alternatives for the proposed anesthesia with the patient or authorized representative who has indicated his/her understanding and acceptance.       Plan Discussed with: CRNA, Anesthesiologist and Surgeon  Anesthesia Plan Comments: (PAT note written 07/04/2019 by Myra Gianotti, PA-C. Case moved from [facility] to [place]. History of severe asthma, COPD, pulmonary nodules, dental implant. Remote history of difficult intubation > 30 years ago--no records currently available. Saw allergist Dr. Neldon Mc on 06/20/2019 for asthma, allergic bronchopulmonary aspergillosis, inflammatory  dermatosis. )       Anesthesia Quick Evaluation

## 2019-07-04 NOTE — Progress Notes (Signed)
Pt denies any acute pulmonary issues. Pt denies chest pain and being under the care of a cardiologist. Pt stated that PCP is Dr. Annitta Jersey [facility] [place], [place]) and  Pulmonologist is Dr. Gwenette Greet. Pt denies having an echo and cardiac cath but stated that a stress test was performed "  > 10-15 years ago. " Pt denies having an EKG and chest x ray in the last year. Pt denies recent labs. Pt made aware to stop taking Aspirin (unless otherwise advised by surgeon), vitamins, fish oil and herbal medications. Do not take any NSAIDs ie: Ibuprofen, Advil, Naproxen (Aleve), Motrin, BC and Goody Powder. Pt reminded to quarantine. Pt verbalized understanding of all pre-op instructions. PA, Anesthesiology, asked to review pt CT of CHEST and anesthesia hx.

## 2019-07-05 ENCOUNTER — Ambulatory Visit (HOSPITAL_COMMUNITY): Payer: No Typology Code available for payment source | Admitting: Vascular Surgery

## 2019-07-05 ENCOUNTER — Encounter (HOSPITAL_COMMUNITY)
Admission: RE | Disposition: A | Discharge status: Home / Self Care | Payer: Self-pay | Source: Home / Self Care | Attending: Otolaryngology

## 2019-07-05 ENCOUNTER — Ambulatory Visit (HOSPITAL_COMMUNITY)
Admission: RE | Admit: 2019-07-05 | Discharge: 2019-07-05 | Disposition: A | Discharge status: Home / Self Care | Payer: No Typology Code available for payment source | Attending: Otolaryngology | Admitting: Otolaryngology

## 2019-07-05 ENCOUNTER — Encounter (HOSPITAL_COMMUNITY): Payer: Self-pay | Admitting: Otolaryngology

## 2019-07-05 DIAGNOSIS — J449 Chronic obstructive pulmonary disease, unspecified: Secondary | ICD-10-CM | POA: Diagnosis not present

## 2019-07-05 DIAGNOSIS — J9601 Acute respiratory failure with hypoxia: Secondary | ICD-10-CM | POA: Diagnosis not present

## 2019-07-05 DIAGNOSIS — D2321 Other benign neoplasm of skin of right ear and external auricular canal: Secondary | ICD-10-CM | POA: Insufficient documentation

## 2019-07-05 DIAGNOSIS — J441 Chronic obstructive pulmonary disease with (acute) exacerbation: Secondary | ICD-10-CM | POA: Diagnosis not present

## 2019-07-05 DIAGNOSIS — H7441 Polyp of right middle ear: Secondary | ICD-10-CM | POA: Diagnosis not present

## 2019-07-05 DIAGNOSIS — H938X1 Other specified disorders of right ear: Secondary | ICD-10-CM | POA: Diagnosis not present

## 2019-07-05 DIAGNOSIS — Q181 Preauricular sinus and cyst: Secondary | ICD-10-CM | POA: Diagnosis not present

## 2019-07-05 DIAGNOSIS — F419 Anxiety disorder, unspecified: Secondary | ICD-10-CM | POA: Diagnosis not present

## 2019-07-05 DIAGNOSIS — H6691 Otitis media, unspecified, right ear: Secondary | ICD-10-CM | POA: Diagnosis not present

## 2019-07-05 HISTORY — DX: Nausea with vomiting, unspecified: R11.2

## 2019-07-05 HISTORY — DX: Other specified postprocedural states: Z98.890

## 2019-07-05 HISTORY — DX: Presence of spectacles and contact lenses: Z97.3

## 2019-07-05 HISTORY — DX: Failed or difficult intubation, initial encounter: T88.4XXA

## 2019-07-05 HISTORY — PX: CYST EXCISION: SHX5701

## 2019-07-05 HISTORY — DX: Anxiety disorder, unspecified: F41.9

## 2019-07-05 LAB — CBC
HCT: 40.2 % (ref 36.0–46.0)
Hemoglobin: 13.4 g/dL (ref 12.0–15.0)
MCH: 31.5 pg (ref 26.0–34.0)
MCHC: 33.3 g/dL (ref 30.0–36.0)
MCV: 94.6 fL (ref 80.0–100.0)
Platelets: 205 10*3/uL (ref 150–400)
RBC: 4.25 MIL/uL (ref 3.87–5.11)
RDW: 12.6 % (ref 11.5–15.5)
WBC: 3.8 10*3/uL — ABNORMAL LOW (ref 4.0–10.5)
nRBC: 0 % (ref 0.0–0.2)

## 2019-07-05 LAB — NO BLOOD PRODUCTS

## 2019-07-05 SURGERY — CYST REMOVAL
Anesthesia: General | Site: Ear | Laterality: Right

## 2019-07-05 MED ORDER — OXYMETAZOLINE HCL 0.05 % NA SOLN
NASAL | Status: DC | PRN
  Administered 2019-07-05: 1 via TOPICAL

## 2019-07-05 MED ORDER — LACTATED RINGERS IV SOLN: INTRAVENOUS | Status: DC

## 2019-07-05 MED ORDER — OXYMETAZOLINE HCL 0.05 % NA SOLN
NASAL | Status: AC
  Filled 2019-07-05: qty 30

## 2019-07-05 MED ORDER — OXYCODONE HCL 5 MG PO TABS: 5.0000 mg | ORAL_TABLET | Freq: Once | ORAL | Status: DC | PRN

## 2019-07-05 MED ORDER — CIPROFLOXACIN-DEXAMETHASONE 0.3-0.1 % OT SUSP
OTIC | Status: AC
  Filled 2019-07-05: qty 7.5

## 2019-07-05 MED ORDER — TRIAMCINOLONE ACETONIDE 40 MG/ML IJ SUSP
INTRAMUSCULAR | Status: DC | PRN
  Administered 2019-07-05: 1 mL

## 2019-07-05 MED ORDER — LIDOCAINE 2% (20 MG/ML) 5 ML SYRINGE
INTRAMUSCULAR | Status: AC
  Filled 2019-07-05: qty 5

## 2019-07-05 MED ORDER — FENTANYL CITRATE (PF) 100 MCG/2ML IJ SOLN: 25.0000 ug | INTRAMUSCULAR | Status: DC | PRN

## 2019-07-05 MED ORDER — CIPROFLOXACIN-DEXAMETHASONE 0.3-0.1 % OT SUSP
OTIC | Status: DC | PRN
  Administered 2019-07-05: 2 [drp] via OTIC

## 2019-07-05 MED ORDER — ONDANSETRON HCL 4 MG/2ML IJ SOLN: 4.0000 mg | Freq: Four times a day (QID) | INTRAMUSCULAR | Status: DC | PRN

## 2019-07-05 MED ORDER — LIDOCAINE-EPINEPHRINE 1 %-1:100000 IJ SOLN
INTRAMUSCULAR | Status: AC
  Filled 2019-07-05: qty 1

## 2019-07-05 MED ORDER — PROPOFOL 500 MG/50ML IV EMUL
INTRAVENOUS | Status: DC | PRN
Start: 2019-07-05 — End: 2019-07-05
  Administered 2019-07-05: 100 ug/kg/min via INTRAVENOUS

## 2019-07-05 MED ORDER — TRIAMCINOLONE ACETONIDE 40 MG/ML IJ SUSP
INTRAMUSCULAR | Status: AC
  Filled 2019-07-05: qty 5

## 2019-07-05 MED ORDER — OXYCODONE HCL 5 MG/5ML PO SOLN: 5.0000 mg | Freq: Once | ORAL | Status: DC | PRN

## 2019-07-05 MED ORDER — MIDAZOLAM HCL 2 MG/2ML IJ SOLN
INTRAMUSCULAR | Status: AC
  Filled 2019-07-05: qty 2

## 2019-07-05 MED ORDER — LIDOCAINE-EPINEPHRINE 1 %-1:100000 IJ SOLN
INTRAMUSCULAR | Status: DC | PRN
  Administered 2019-07-05: 1 mL

## 2019-07-05 MED ORDER — FENTANYL CITRATE (PF) 250 MCG/5ML IJ SOLN
INTRAMUSCULAR | Status: AC
  Filled 2019-07-05: qty 5

## 2019-07-05 MED ORDER — LACTATED RINGERS IV SOLN: INTRAVENOUS | Status: DC | PRN

## 2019-07-05 MED ORDER — FENTANYL CITRATE (PF) 250 MCG/5ML IJ SOLN
INTRAMUSCULAR | Status: DC | PRN
  Administered 2019-07-05: 25 ug via INTRAVENOUS

## 2019-07-05 MED ORDER — LIDOCAINE 2% (20 MG/ML) 5 ML SYRINGE
INTRAMUSCULAR | Status: DC | PRN
  Administered 2019-07-05: 60 mg via INTRAVENOUS

## 2019-07-05 MED ORDER — MIDAZOLAM HCL 2 MG/2ML IJ SOLN
INTRAMUSCULAR | Status: DC | PRN
  Administered 2019-07-05: 2 mg via INTRAVENOUS

## 2019-07-05 SURGICAL SUPPLY — 20 items
ASPIRATOR COLLECTOR MID EAR (MISCELLANEOUS) IMPLANT
BLADE EAR TYMPAN 2.5 STR BEAV (BLADE) ×1 IMPLANT
BLADE MYRINGOTOMY 6 SPEAR HDL (BLADE) IMPLANT
CANISTER SUCT 3000ML PPV (MISCELLANEOUS) ×2 IMPLANT
COTTONBALL LRG STERILE PKG (GAUZE/BANDAGES/DRESSINGS) ×2 IMPLANT
COVER MAYO STAND STRL (DRAPES) ×2 IMPLANT
COVER WAND RF STERILE (DRAPES) ×2 IMPLANT
DRAPE HALF SHEET 40X57 (DRAPES) ×2 IMPLANT
GLOVE ECLIPSE 7.5 STRL STRAW (GLOVE) ×2 IMPLANT
KIT TURNOVER KIT B (KITS) ×2 IMPLANT
NDL HYPO 25GX1X1/2 BEV (NEEDLE) IMPLANT
NEEDLE HYPO 25GX1X1/2 BEV (NEEDLE) IMPLANT
PAD ARMBOARD 7.5X6 YLW CONV (MISCELLANEOUS) ×4 IMPLANT
POSITIONER HEAD DONUT 9IN (MISCELLANEOUS) IMPLANT
SYR BULB EAR ULCER 3OZ GRN STR (SYRINGE) IMPLANT
TOWEL GREEN STERILE FF (TOWEL DISPOSABLE) ×2 IMPLANT
TUBE CONNECTING 12X1/4 (SUCTIONS) ×2 IMPLANT
TUBE EAR SHEEHY BUTTON 1.27 (OTOLOGIC RELATED) IMPLANT
TUBING EXTENTION W/L.L. (IV SETS) ×1 IMPLANT
WATER STERILE IRR 1000ML POUR (IV SOLUTION) ×2 IMPLANT

## 2019-07-05 NOTE — Op Note (Signed)
DATE OF PROCEDURE: 07/05/2019   PRE-OPERATIVE DIAGNOSIS: Right ear canal cyst   POST-OPERATIVE DIAGNOSIS: Same   PROCEDURE(S): Debridement right ear canal   SURGEON:  Gavin Pound, MD    ASSISTANT(S): none    ANESTHESIA: MAC    ESTIMATED BLOOD LOSS: 5 mL  SPECIMENS: Right ear canal tissue   COMPLICATIONS: None    OPERATIVE FINDINGS: At the junction of the right external auditory canal bony and cartilaginous region there is a newly formed scar which is restricting the circumference of the right external auditory canal.  This is a result of a granuloma which formed as a result of an otitis externa infection.  There was no evidence of any coalescent mastoiditis r ulcerative mass.  The tympanic membrane appears intact and middle ear structures are normal.  Infection seems to have completely resolved.   OPERATIVE DETAILS: The patient was brought to the operating room placed in supine position.  MAC was induced with anesthesia.  A timeout was performed.  The right external auditory canal was prepped with Betadine.  The external auditory canal was anesthetized with 1% lidocaine with 100,000 epinephrine.  A small incision was made posteriorly inferiorly through the cyst of the external auditory canal and the skin of the ear canal laid open.  Up-biting cup forceps were utilized to excise some of the tissue more anteriorly inferiorly in the external auditory canal.  This was sent to pathology.  The ear canal was suctioned.  Hemostasis was achieved with Afrin.  An otowick was placed.  Ciprodex drops were placed.  All instrumentation was then removed.  The patient tolerated the procedure well and there were no complications.   Helayne Seminole, MD

## 2019-07-05 NOTE — Transfer of Care (Signed)
Immediate Anesthesia Transfer of Care Note  Patient: Patricia Johnston  Procedure(s) Performed: Excision of right ear canal cyst (Right Ear)  Patient Location: PACU  Anesthesia Type:MAC  Level of Consciousness: awake, alert  and oriented  Airway & Oxygen Therapy: Patient Spontanous Breathing and Patient connected to face mask oxygen  Post-op Assessment: Report given to RN and Post -op Vital signs reviewed and stable  Post vital signs: Reviewed and stable  Last Vitals:  Vitals Value Taken Time  BP 112/70 07/05/19 1138  Temp 36.6 C 07/05/19 1138  Pulse 58 07/05/19 1141  Resp 13 07/05/19 1141  SpO2 97 % 07/05/19 1141  Vitals shown include unvalidated device data.  Last Pain:  Vitals:   07/05/19 1138  TempSrc:   PainSc: Asleep         Complications: No apparent anesthesia complications

## 2019-07-05 NOTE — Discharge Instructions (Signed)
Do not let water get in your right ear.  Continue to protect the right ear with cotton ball coated and Vaseline if you are going to shower or wash her hair.  An otowick was placed in the right ear.  Ciprodex drops should be applied, about 4 to 5 drops, to the pack in the right ear twice daily.  Alternate Tylenol and/or ibuprofen as needed for pain.

## 2019-07-05 NOTE — Anesthesia Postprocedure Evaluation (Signed)
Anesthesia Post Note  Patient: Patricia Johnston  Procedure(s) Performed: Excision of right ear canal cyst (Right Ear)     Patient location during evaluation: PACU Anesthesia Type: General Level of consciousness: awake and alert Pain management: pain level controlled Vital Signs Assessment: post-procedure vital signs reviewed and stable Respiratory status: spontaneous breathing, nonlabored ventilation, respiratory function stable and patient connected to nasal cannula oxygen Cardiovascular status: blood pressure returned to baseline and stable Postop Assessment: no apparent nausea or vomiting Anesthetic complications: no    Last Vitals:  Vitals:   07/05/19 1153 07/05/19 1207  BP: 100/68 118/71  Pulse: 64 (!) 56  Resp: 18 12  Temp:  (!) 36.4 C  SpO2: 97% 98%    Last Pain:  Vitals:   07/05/19 1207  TempSrc:   PainSc: 0-No pain                 Zerick Prevette

## 2019-07-05 NOTE — Anesthesia Procedure Notes (Signed)
Procedure Name: MAC Date/Time: 07/05/2019 10:56 AM Performed by: Alain Marion, CRNA Pre-anesthesia Checklist: Patient identified, Emergency Drugs available, Suction available, Patient being monitored and Timeout performed Oxygen Delivery Method: Simple face mask Placement Confirmation: positive ETCO2

## 2019-07-05 NOTE — H&P (Signed)
The surgical history remains accurate and without interval change. The condition still exists which makes the procedure necessary. The patient and/or family is aware of their condition and has been informed of the risks and benefits of surgery, as well as alternatives. All parties have elected to proceed with surgery.   Surgical plan: debridement, biopsy, wick placement of right ear canal in OR    Otolaryngology Return Patient Note  Subjective: Ms. Patricia Johnston is a 69 y.o. female seen in follow up today for right otitis externa. She reports she is now off the pain medication. Ear feels like it has opened up. No bleeding the last few days. She is scheduled for debridement in the operating room on 5/5.  History reviewed. No pertinent past medical history. History reviewed. No pertinent surgical history. History reviewed. No pertinent family history. Social History   Socioeconomic History  . Marital status: Not on file  Spouse name: Not on file  . Number of children: Not on file  . Years of education: Not on file  . Highest education level: Not on file  Occupational History  . Not on file  Tobacco Use  . Smoking status: Never Smoker  . Smokeless tobacco: Never Used  Substance and Sexual Activity  . Alcohol use: Not on file  . Drug use: Not on file  . Sexual activity: Not on file  Other Topics Concern  . Not on file  Social History Narrative  . Not on file   Social Determinants of Health   Financial Resource Strain:  . Difficulty of Paying Living Expenses:  Food Insecurity:  . Worried About Charity fundraiser in the Last Year:  . Arboriculturist in the Last Year:  Transportation Needs:  . Film/video editor (Medical):  Marland Kitchen Lack of Transportation (Non-Medical):  Physical Activity:  . Days of Exercise per Week:  . Minutes of Exercise per Session:  Stress:  . Feeling of Stress :  Social Connections:  . Frequency of Communication with Friends and Family:  .  Frequency of Social Gatherings with Friends and Family:  . Attends Religious Services:  . Active Member of Clubs or Organizations:  . Attends Archivist Meetings:  Marland Kitchen Marital Status:   Allergies  Allergen Reactions  . Flonase [Fluticasone Propionate] Bleeding (intolerance)  . Nasacort [Triamcinolone Acetonide] Bleeding (intolerance)   Updated Medication List:   ciprofloxacin-dexAMETHasone (CIPRODEX) 0.3-0.1 % otic suspension  Sig - Route: Place 4 drops into the right ear 2 times daily for 14 days. - Right Ear  ofloxacin (FLOXIN) 0.3 % otic solution  Sig - Route: Place 10 drops into the right ear daily. - Right Ear  Class: Historical Med    ROS A complete review of systems was conducted and was negative except as stated in the HPI.   Objective: Vitals:  07/03/19 1334  BP: 133/77  Pulse: 79  Temp: 97.8 F (36.6 C)  Resp: 14  SpO2: 98%  Height: 1.626 m (_0 )  Weight: 61.2 kg (135 lb)  BMI (Calculated): 23.2   Physical Exam:  General Normocephalic, Awake, Alert  Eyes PERRL, no scleral icterus or conjunctival hemorrhage. EOMI.  Ears Right ear- canal more patent, can visualize posterior portion tympanic membrane. There is some soupy otorrhea which was suctioned away. Anteriorly and inferiorly the polyp of granulation tissue is gone however there is still some swelling of this part of the skin of the ear canal which is concerning. There is now some partial scar  formation which is reducing the circumference of the external auditory canal.  Cardio-vascular No cyanosis, regular rate  Pulmonary No audible stridor, Breathing easily with no labor.  Neuro Symmetric facial movement. Tongue protrudes in midline.  Psychiatry Appropriate affect and mood for clinic visit.  Skin No scars or lesions on face or neck.    Assessment:  My impression is that [name] has  1. Acute diffuse otitis externa of right ear  2. Polyp of right middle ear  .  Clinically improving though  now she is getting significant stenosis/new scar formation. I would prefer to make some radial incisions in the stenosis and take some biopsies in the operating room and in place an otowick to prevent any significant canal stenosis as I cannot currently fully see her eardrum. The infection is much better and her symptoms are much better.    Plan:  1. Continue water precautions. 2. Will plan for debridement, biopsy, wick placement of right ear in OR Wednesday, preferable under MAC.  3. Ciprodex drops to the right ear 3 times daily    Helayne Seminole

## 2019-07-06 ENCOUNTER — Encounter: Payer: Self-pay | Admitting: *Deleted

## 2019-07-06 LAB — SURGICAL PATHOLOGY

## 2019-07-10 ENCOUNTER — Ambulatory Visit: Payer: Medicare Other

## 2019-10-19 IMAGING — DX PORTABLE CHEST - 1 VIEW
1 series · 1 of 1 positions shown · non-contrast
Comparison: 05/22/2016

CLINICAL DATA: Cough and short of breath

EXAM:
PORTABLE CHEST 1 VIEW

[chest ap]
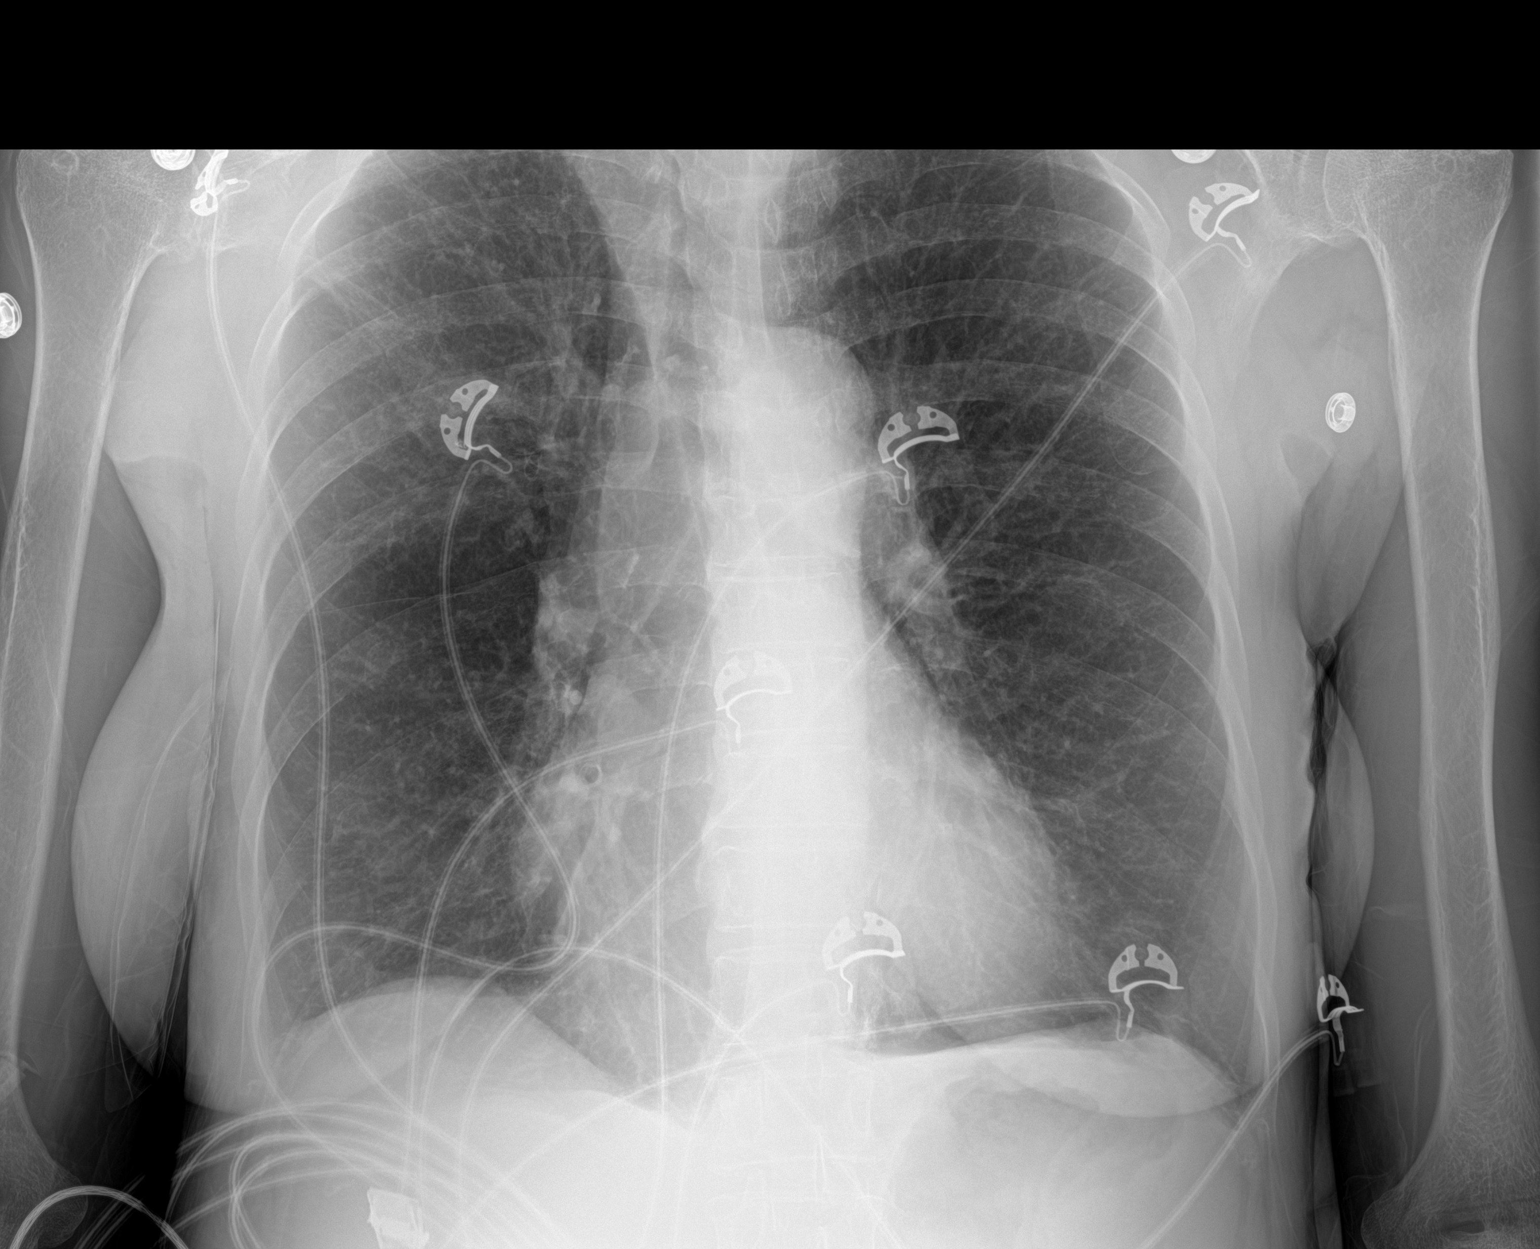

[1 of 1 positions shown; findings below may reference images not displayed]

FINDINGS: Hyperinflation without focal pulmonary infiltrate. Borderline
cardiomegaly. No pneumothorax.
IMPRESSION: No active disease.  Hyperinflation

## 2019-12-22 DIAGNOSIS — Z23 Encounter for immunization: Secondary | ICD-10-CM | POA: Diagnosis not present

## 2019-12-28 ENCOUNTER — Other Ambulatory Visit: Payer: Self-pay | Admitting: Family Medicine

## 2019-12-28 DIAGNOSIS — Z1231 Encounter for screening mammogram for malignant neoplasm of breast: Secondary | ICD-10-CM

## 2020-05-28 ENCOUNTER — Ambulatory Visit: Payer: No Typology Code available for payment source | Admitting: Allergy and Immunology

## 2020-09-30 ENCOUNTER — Telehealth (HOSPITAL_BASED_OUTPATIENT_CLINIC_OR_DEPARTMENT_OTHER): Payer: Self-pay | Admitting: Physical Therapy

## 2020-09-30 NOTE — Telephone Encounter (Signed)
Pt LVM to schedule evaluation.  LVM in response this  morning stating that there is not a referral in our system at this time and requested call back at her convenience.  Farah Lepak C. Areyana Leoni PT, DPT 09/30/20 8:01 AM

## 2020-10-25 ENCOUNTER — Telehealth (HOSPITAL_COMMUNITY): Payer: Self-pay

## 2020-10-25 NOTE — Telephone Encounter (Signed)
Called patient to see if she is interested in the Pulmonary Rehab Program. Patient expressed interest. Explained scheduling process and went over insurance, patient verbalized understanding. Will contact patient for scheduling once RN review. Adv pt of back log of 1-3 months.

## 2020-10-25 NOTE — Telephone Encounter (Signed)
Pt  is  covered thru the  [place]. [place] auth# [id number].

## 2020-11-15 ENCOUNTER — Other Ambulatory Visit: Payer: Self-pay

## 2020-11-15 ENCOUNTER — Ambulatory Visit (INDEPENDENT_AMBULATORY_CARE_PROVIDER_SITE_OTHER): Payer: No Typology Code available for payment source | Admitting: Student

## 2020-11-15 VITALS — BP 110/62 | HR 72 | Ht 66.0 in | Wt 141.6 lb

## 2020-11-15 DIAGNOSIS — B4481 Allergic bronchopulmonary aspergillosis: Secondary | ICD-10-CM

## 2020-11-15 MED ORDER — TIOTROPIUM BROMIDE-OLODATEROL 2.5-2.5 MCG/ACT IN AERS: 2.0000 | INHALATION_SPRAY | Freq: Every day | RESPIRATORY_TRACT | 11 refills | Status: AC

## 2020-11-15 MED ORDER — MOMETASONE FUROATE 220 MCG/INH IN AEPB: 1.0000 | INHALATION_SPRAY | Freq: Two times a day (BID) | RESPIRATORY_TRACT | 11 refills | Status: AC

## 2020-11-15 NOTE — Patient Instructions (Addendum)
-   we will schedule spirometry before next visit - labs whenever it's convenient for you before next visit - see you in 6 months in clinic - send my chart message or call if you need anything in the interim!!

## 2020-11-15 NOTE — Progress Notes (Signed)
Synopsis: Referred for COPD by Joseph Art, MD  Subjective:   PATIENT ID: Patricia Johnston GENDER: female DOB: Aug 28, 1950, MRN: [id number]  Chief Complaint  Patient presents with   Consult    States she was referred to follow up, because Dr Gwenette Greet retired, denies sob, cough   70yF with history of COPD previously followed by Dr. Dillard Essex and Dr. Gwenette Greet. Also noted to have elevated IgE and eosinophilia. Had been under consideration for nucala.  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 It has helped her functional independence, dyspnea. She never got prolonged course of steroids or itraconazole by her report. On stiolto, asmanex with spacer, albuterol prn. She hasn't had bothersome cough in a while.   She had an adverse reaction to fasenra (tried it one month). Irritable, anxious, insomnia. Went back to nucala 08/2020. She thinks she now sees Dr. Garald Balding at allergy at [facility].   Goes for walks, gardens. Works in her ministry everyday as Restaurant manager, fast food.   Otherwise pertinent review of systems is negative.  Past Medical History:  Diagnosis Date   Anxiety    Asthma    COPD (chronic obstructive pulmonary disease) ([place])    pt states due to [employer] service contracted COPD   Difficult intubation    Pt does not remember details over 30 years ago   Eczema    Esophageal thickening 10/13/2018   on [facility]   GERD (gastroesophageal reflux disease)    Osteoporosis    PONV (postoperative nausea and vomiting)    Pulmonary emphysema (HCC)    Pulmonary nodules    Traumatic arthritis    Umbilical hernia    Wears glasses      Family History  Problem Relation Age of Onset   Colon cancer Neg Hx    Esophageal cancer Neg Hx      Past Surgical History:  Procedure Laterality Date   ABDOMINAL HYSTERECTOMY     bunionectomy     COLONOSCOPY     due 2022 or 2023 per patient   CYST EXCISION Right 07/05/2019   Procedure: Excision of  right ear canal cyst;  Surgeon: Helayne Seminole, MD;  Location: [facility] OR;  Service: ENT;  Laterality: Right;   dental implant      Social History   Socioeconomic History   Marital status: Single    Spouse name: Not on file   Number of children: Not on file   Years of education: Not on file   Highest education level: Not on file  Occupational History   Occupation: retired  Tobacco Use   Smoking status: Never   Smokeless tobacco: Never  Vaping Use   Vaping Use: Never used  Substance and Sexual Activity   Alcohol use: Yes    Alcohol/week: 0.0 standard drinks    Comment: ocassionally   Drug use: No   Sexual activity: Not on file  Other Topics Concern   Not on file  Social History Narrative   Not on file   Social Determinants of Health   Financial Resource Strain: Not on file  Food Insecurity: Not on file  Transportation Needs: Not on file  Physical Activity: Not on file  Stress: Not on file  Social Connections: Not on file  Intimate Partner Violence: Not on file     Allergies  Allergen Reactions   Flonase [Fluticasone Propionate] Other (See Comments)    Nasal bleeding   Nasacort [Triamcinolone] Other (See  Comments)    Nasal bleeding     Outpatient Medications Prior to Visit  Medication Sig Dispense Refill   albuterol (PROVENTIL) (2.5 MG/3ML) 0.083% nebulizer solution Take 2.5 mg by nebulization as needed for wheezing or shortness of breath.     albuterol (VENTOLIN HFA) 108 (90 Base) MCG/ACT inhaler Inhale 2 puffs into the lungs every 4 (four) hours as needed for wheezing or shortness of breath. 1 Inhaler 1   alendronate (FOSAMAX) 70 MG tablet Take 70 mg by mouth every Monday. Take with a full glass of water on an empty stomach.     Calcium Carb-Cholecalciferol (CALCIUM 500+D PO) Take 1 tablet by mouth daily.     diphenhydrAMINE (BENADRYL) 25 MG tablet Take 25 mg by mouth every 6 (six) hours as needed (anxiety).     feeding supplement, ENSURE ENLIVE, (ENSURE  ENLIVE) LIQD Take 237 mLs by mouth 2 (two) times daily between meals. (Patient taking differently: Take 237 mLs by mouth daily as needed (supplement).) 237 mL 12   guaiFENesin (MUCINEX) 600 MG 12 hr tablet Take 1 tablet (600 mg total) by mouth 2 (two) times daily. 30 tablet 0   mepolizumab (NUCALA) 100 MG/ML SOSY INJECT '100MG'$  (1 PREFILL SYRINGE) SUBCUTANEOUSLY EVERY 28 DAYS (APPROVED) FOR CLINIC INFUSION IN [place] ONLY     Multiple Vitamins-Minerals (MULTIVITAMIN WITH MINERALS) tablet Take 2 tablets by mouth daily.     mometasone (ASMANEX) 220 MCG/INH inhaler Inhale 1 puff into the lungs 2 (two) times daily.      Tiotropium Bromide-Olodaterol 2.5-2.5 MCG/ACT AERS Inhale 2 puffs into the lungs daily.     ofloxacin (FLOXIN) 0.3 % OTIC solution Place 3 drops into the right ear in the morning and at bedtime.     No facility-administered medications prior to visit.       Objective:   Physical Exam:  General appearance: 70 y.o., female, NAD, conversant  Eyes: anicteric sclerae, moist conjunctivae; no lid-lag; PERRL, tracking appropriately HENT: NCAT; oropharynx, MMM, no mucosal ulcerations; normal hard and soft palate Neck: Trachea midline; no lymphadenopathy, no JVD Lungs: CTAB, no crackles, no wheeze, with normal respiratory effort CV: RRR, no MRGs  Abdomen: Soft, non-tender; non-distended, BS present  Extremities: No peripheral edema, radial and DP pulses present bilaterally  Skin: Normal temperature, turgor and texture; no rash Psych: Appropriate affect Neuro: Alert and oriented to person and place, no focal deficit    Vitals:   11/15/20 1547  BP: 110/62  Pulse: 72  SpO2: 96%  Weight: 141 lb 9.6 oz (64.2 kg)  Height: '5\' 6"'$  (1.676 m)   96% on RA BMI Readings from Last 3 Encounters:  11/15/20 22.85 kg/m  07/05/19 22.83 kg/m  06/20/19 22.45 kg/m   Wt Readings from Last 3 Encounters:  11/15/20 141 lb 9.6 oz (64.2 kg)  07/05/19 133 lb (60.3 kg)  06/20/19 130 lb 12.8  oz (59.3 kg)     CBC    Component Value Date/Time   WBC 3.8 (L) 07/05/2019 0842   RBC 4.25 07/05/2019 0842   HGB 13.4 07/05/2019 0842   HCT 40.2 07/05/2019 0842   PLT 205 07/05/2019 0842   MCV 94.6 07/05/2019 0842   MCH 31.5 07/05/2019 0842   MCHC 33.3 07/05/2019 0842   RDW 12.6 07/05/2019 0842   LYMPHSABS 2.9 05/26/2016 0557   MONOABS 0.4 05/26/2016 0557   EOSABS 0.3 05/26/2016 0557   BASOSABS 0.0 05/26/2016 0557    [facility] absolute eosinophil count  of 790, IgE 2691, IgM 29 mg/DL, normal IgG and IgA.   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  FEV1 47% pred 06/2019  PFTs 2017 with ratio 38, FEV1 48% pred with + BD response, hyperinflation, very severe air trapping  Echocardiogram:   None     Assessment & Plan:   # ACOS # ABPA  Symptoms seem to be under much better control since starting nucala. Has not needed steroid burst since last visit with pulmonary. Never did get antifungal or prolonged steroid course prior to initiation of nucala.    Plan: - IgE, cbc/diff - spirometry next visit - we will start prescribing nucala here, next due 10/6 - refilled stiolto, asmanex (using with spacer)     Maryjane Hurter, MD [facility] 11/15/2020 4:51 PM

## 2020-11-19 ENCOUNTER — Telehealth: Payer: Self-pay

## 2020-11-19 ENCOUNTER — Other Ambulatory Visit (HOSPITAL_COMMUNITY): Payer: Self-pay

## 2020-11-19 NOTE — Telephone Encounter (Signed)
Called pt to discuss pharmacy benefit coverage, LVM requesting call back at clinic.

## 2020-11-19 NOTE — Telephone Encounter (Addendum)
At most recent OV, pt states she would like to start filling her medications with [place] rather than at the [place]. Will begin investigation to determine if this is something we can assist her with.  Pt's next injection is due 10/6.

## 2020-11-28 ENCOUNTER — Other Ambulatory Visit (HOSPITAL_COMMUNITY): Payer: Self-pay

## 2020-11-28 NOTE — Telephone Encounter (Addendum)
Called patient to clarify request. Patient would like to see if she could receive Nucala in-office by infusion team instead of having to go to the [place]. Routing to infusion team.  Patient is not interested in self-injecting

## 2020-11-29 NOTE — Telephone Encounter (Addendum)
Called left v/m with [facility] (case manager Mamie Levers) Patient will need referral for the [place]. Phone: [phone]. Awaiting call-back  Will f/u with response

## 2020-12-03 NOTE — Telephone Encounter (Signed)
(  Fyi) Completed and emailed/faxed forms to Phillips Climes (case manager) for change of site.  Phone: [phone] Email: [email or web address]  Will f/u with response.

## 2020-12-05 ENCOUNTER — Other Ambulatory Visit: Payer: Self-pay | Admitting: Pharmacy Technician

## 2020-12-05 NOTE — Telephone Encounter (Addendum)
Auth Submission: NUCALA Payer: [facility] Medication & CPT/J Code(s) submitted: Nucala (Mepolizumab) [id number] Route of submission (phone, fax, portal): PHONE: [phone] EXT [phone] Auth type: Buy/Bill Units/visits requested: 6 visits Reference number: [id number] Approval date: 11/15/21- 05/14/21  Once appt has been made new updated forms will be sent.  Call @ [facility]: [phone] ext [phone] for updated forms.  Houston Siren (Nurse Navigator) [facility] is a approved site of care. Phone: [phone] [email or web address]  Patient will call and schedule next appointment, then [facility] will contact [facility] @ [facility] to update site of care.  @[place]  please place therapy plan

## 2020-12-24 ENCOUNTER — Encounter (HOSPITAL_COMMUNITY): Payer: Self-pay | Admitting: *Deleted

## 2020-12-24 NOTE — Progress Notes (Signed)
Received second Authorization [id number] from Care in the community department with the [place] for this veteran to participate in Pulmonary Rehab.  This veteran was referred this past summer.  When contacted she declined due to the wait list and wanted to see if any other location could get her in sooner.  Per the [facility] authorization [place] contacted her - this was too far for her to drive. Referred to [facility] - they have not resumed their Pulmonary rehab program. Referred to [facility] - this was too far. Contacted pt to ascertain that she would like to come to [facility].  She indicated that she would, advised her that the wait list remains and has since we spoke with her is longer.  Zierra stated that she is willing to wait and to put her on the List.  Reviewed accompany documents for last clinic visit with Dr. Gwenette Greet.  Pt remains appropriate to schedule pulmonary rehab.  Will forward to support staff to contact for scheduling when able. Cherre Huger, BSN Cardiac and Training and development officer

## 2020-12-31 DIAGNOSIS — J342 Deviated nasal septum: Secondary | ICD-10-CM | POA: Diagnosis not present

## 2020-12-31 DIAGNOSIS — H6123 Impacted cerumen, bilateral: Secondary | ICD-10-CM | POA: Diagnosis not present

## 2020-12-31 DIAGNOSIS — H9201 Otalgia, right ear: Secondary | ICD-10-CM | POA: Diagnosis not present

## 2020-12-31 DIAGNOSIS — J343 Hypertrophy of nasal turbinates: Secondary | ICD-10-CM | POA: Diagnosis not present

## 2020-12-31 DIAGNOSIS — H61893 Other specified disorders of external ear, bilateral: Secondary | ICD-10-CM | POA: Diagnosis not present

## 2021-01-03 ENCOUNTER — Ambulatory Visit: Payer: No Typology Code available for payment source

## 2021-01-29 DIAGNOSIS — Z23 Encounter for immunization: Secondary | ICD-10-CM | POA: Diagnosis not present

## 2021-02-11 DIAGNOSIS — H61891 Other specified disorders of right external ear: Secondary | ICD-10-CM | POA: Diagnosis not present

## 2021-02-11 DIAGNOSIS — H6122 Impacted cerumen, left ear: Secondary | ICD-10-CM | POA: Diagnosis not present

## 2021-02-11 DIAGNOSIS — H6241 Otitis externa in other diseases classified elsewhere, right ear: Secondary | ICD-10-CM | POA: Diagnosis not present

## 2021-02-11 DIAGNOSIS — B369 Superficial mycosis, unspecified: Secondary | ICD-10-CM | POA: Diagnosis not present

## 2021-02-11 DIAGNOSIS — Z1231 Encounter for screening mammogram for malignant neoplasm of breast: Secondary | ICD-10-CM | POA: Diagnosis not present

## 2021-02-26 ENCOUNTER — Telehealth (HOSPITAL_COMMUNITY): Payer: Self-pay

## 2021-02-26 NOTE — Telephone Encounter (Signed)
Called patient to see if she was interested in participating in the Pulmonary Rehab Program. Patient stated yes. Patient will come in for orientation on 03/26/2021@9 :00am and will attend the 1:15pm exercise class.   Tourist information centre manager.

## 2021-03-05 ENCOUNTER — Other Ambulatory Visit: Payer: Self-pay

## 2021-03-05 ENCOUNTER — Ambulatory Visit (INDEPENDENT_AMBULATORY_CARE_PROVIDER_SITE_OTHER): Payer: Medicare Other

## 2021-03-05 ENCOUNTER — Encounter: Payer: Self-pay | Admitting: Student

## 2021-03-05 VITALS — BP 132/75 | HR 71 | Temp 98.0°F | Resp 16 | Ht 66.0 in | Wt 141.8 lb

## 2021-03-05 DIAGNOSIS — R768 Other specified abnormal immunological findings in serum: Secondary | ICD-10-CM | POA: Diagnosis not present

## 2021-03-05 DIAGNOSIS — H6121 Impacted cerumen, right ear: Secondary | ICD-10-CM | POA: Diagnosis not present

## 2021-03-05 DIAGNOSIS — H6241 Otitis externa in other diseases classified elsewhere, right ear: Secondary | ICD-10-CM | POA: Diagnosis not present

## 2021-03-05 DIAGNOSIS — B369 Superficial mycosis, unspecified: Secondary | ICD-10-CM | POA: Diagnosis not present

## 2021-03-05 MED ORDER — ALBUTEROL SULFATE HFA 108 (90 BASE) MCG/ACT IN AERS: 2.0000 | INHALATION_SPRAY | Freq: Once | RESPIRATORY_TRACT | Status: DC | PRN

## 2021-03-05 MED ORDER — MEPOLIZUMAB 100 MG ~~LOC~~ SOLR
100.0000 mg | Freq: Once | SUBCUTANEOUS | Status: AC
  Administered 2021-03-05: 100 mg via SUBCUTANEOUS
  Filled 2021-03-05: qty 1

## 2021-03-05 MED ORDER — EPINEPHRINE 0.3 MG/0.3ML IJ SOAJ: 0.3000 mg | Freq: Once | INTRAMUSCULAR | Status: DC | PRN

## 2021-03-05 MED ORDER — METHYLPREDNISOLONE SODIUM SUCC 125 MG IJ SOLR: 125.0000 mg | Freq: Once | INTRAMUSCULAR | Status: DC | PRN

## 2021-03-05 MED ORDER — DIPHENHYDRAMINE HCL 50 MG/ML IJ SOLN: 50.0000 mg | Freq: Once | INTRAMUSCULAR | Status: DC | PRN

## 2021-03-05 MED ORDER — SODIUM CHLORIDE 0.9 % IV SOLN: Freq: Once | INTRAVENOUS | Status: DC | PRN

## 2021-03-05 MED ORDER — FAMOTIDINE IN NACL 20-0.9 MG/50ML-% IV SOLN: 20.0000 mg | Freq: Once | INTRAVENOUS | Status: DC | PRN

## 2021-03-05 NOTE — Progress Notes (Signed)
Diagnosis: COPD with acute exacerbation  Provider:  Marshell Garfinkel, MD  Procedure: Injection  Nucala (Mepolizumab), Dose: 100 mg, Site: subcutaneous, Number of injections: 1  Discharge: Condition: Good, Destination: Home . AVS provided to patient.   Performed by:  Charlie Pitter, RN

## 2021-03-19 ENCOUNTER — Encounter: Payer: Self-pay | Admitting: Student

## 2021-03-24 DIAGNOSIS — H6121 Impacted cerumen, right ear: Secondary | ICD-10-CM | POA: Diagnosis not present

## 2021-03-24 DIAGNOSIS — B369 Superficial mycosis, unspecified: Secondary | ICD-10-CM | POA: Diagnosis not present

## 2021-03-24 DIAGNOSIS — H6241 Otitis externa in other diseases classified elsewhere, right ear: Secondary | ICD-10-CM | POA: Diagnosis not present

## 2021-03-25 ENCOUNTER — Telehealth (HOSPITAL_COMMUNITY): Payer: Self-pay | Admitting: *Deleted

## 2021-03-25 NOTE — Telephone Encounter (Signed)
Called pt to confirm her [place] orientation appointment.She states that she will be attending. We discussed Covid precautions, proper shoe, directions to the department and our contact number. She voiced understanding.

## 2021-03-26 ENCOUNTER — Encounter (HOSPITAL_COMMUNITY): Payer: Self-pay

## 2021-03-26 ENCOUNTER — Encounter (HOSPITAL_COMMUNITY)
Admission: RE | Admit: 2021-03-26 | Discharge: 2021-03-26 | Disposition: A | Discharge status: Home / Self Care | Payer: No Typology Code available for payment source | Source: Ambulatory Visit | Attending: Pulmonary Disease | Admitting: Pulmonary Disease

## 2021-03-26 ENCOUNTER — Other Ambulatory Visit: Payer: Self-pay

## 2021-03-26 VITALS — BP 112/60 | Ht 64.5 in | Wt 138.2 lb

## 2021-03-26 DIAGNOSIS — J449 Chronic obstructive pulmonary disease, unspecified: Secondary | ICD-10-CM | POA: Insufficient documentation

## 2021-03-26 NOTE — Progress Notes (Signed)
Pulmonary Individual Treatment Plan  Patient Details  Name: Patricia Johnston MRN: [id number] Date of Birth: [birth year] Referring Provider:   April Manson Pulmonary Rehab Walk Test from 03/26/2021 in [place]  Referring Provider Clance  [[place] coverage]       Initial Encounter Date:  Flowsheet Row Pulmonary Rehab Walk Test from 03/26/2021 in [place]  Date 03/26/21       Visit Diagnosis: Chronic obstructive pulmonary disease, unspecified COPD type ([place])  Patient's Home Medications on Admission:   Current Outpatient Medications:    albuterol (PROVENTIL) (2.5 MG/3ML) 0.083% nebulizer solution, Take 2.5 mg by nebulization as needed for wheezing or shortness of breath., Disp: , Rfl:    albuterol (VENTOLIN HFA) 108 (90 Base) MCG/ACT inhaler, Inhale 2 puffs into the lungs every 4 (four) hours as needed for wheezing or shortness of breath., Disp: 1 Inhaler, Rfl: 1   alendronate (FOSAMAX) 70 MG tablet, Take 70 mg by mouth every Monday. Take with a full glass of water on an empty stomach., Disp: , Rfl:    Calcium Carb-Cholecalciferol (CALCIUM 500+D PO), Take 1 tablet by mouth daily., Disp: , Rfl:    diphenhydrAMINE (BENADRYL) 25 MG tablet, Take 25 mg by mouth every 6 (six) hours as needed (anxiety)., Disp: , Rfl:    guaiFENesin (MUCINEX) 600 MG 12 hr tablet, Take 1 tablet (600 mg total) by mouth 2 (two) times daily., Disp: 30 tablet, Rfl: 0   mepolizumab (NUCALA) 100 MG/ML SOSY, INJECT 100MG  (1 PREFILL SYRINGE) SUBCUTANEOUSLY EVERY 28 DAYS (APPROVED) FOR CLINIC INFUSION IN [place] ONLY, Disp: , Rfl:    mometasone (ASMANEX) 220 MCG/INH inhaler, Inhale 1 puff into the lungs 2 (two) times daily., Disp: 1 each, Rfl: 11   Multiple Vitamins-Minerals (MULTIVITAMIN WITH MINERALS) tablet, Take 2 tablets by mouth daily., Disp: , Rfl:    Tiotropium Bromide-Olodaterol 2.5-2.5 MCG/ACT AERS, Inhale 2 puffs into the lungs daily., Disp:  1 each, Rfl: 11   feeding supplement, ENSURE ENLIVE, (ENSURE ENLIVE) LIQD, Take 237 mLs by mouth 2 (two) times daily between meals. (Patient not taking: Reported on 03/26/2021), Disp: 237 mL, Rfl: 12  Past Medical History: Past Medical History:  Diagnosis Date   Anxiety    Asthma    COPD (chronic obstructive pulmonary disease) ([place])    pt states due to [employer] service contracted COPD   Difficult intubation    Pt does not remember details over 30 years ago   Eczema    Esophageal thickening 10/13/2018   on CT- [facility]   GERD (gastroesophageal reflux disease)    Osteoporosis    PONV (postoperative nausea and vomiting)    Pulmonary emphysema (HCC)    Pulmonary nodules    Traumatic arthritis    Umbilical hernia    Wears glasses     Tobacco Use: Social History   Tobacco Use  Smoking Status Never  Smokeless Tobacco Never    Labs: Recent Review Flowsheet Data   There is no flowsheet data to display.     Capillary Blood Glucose: Lab Results  Component Value Date   GLUCAP 99 06/16/2017     Pulmonary Assessment Scores:  Pulmonary Assessment Scores     Row Name 03/26/21 1031         ADL UCSD   ADL Phase Entry     SOB Score total 60       CAT Score   CAT Score 29  UCSD: Self-administered rating of dyspnea associated with activities of daily living (ADLs) 6-point scale (0 = "not at all" to 5 = "maximal or unable to do because of breathlessness")  Scoring Scores range from 0 to 120.  Minimally important difference is 5 units  CAT: CAT can identify the health impairment of COPD patients and is better correlated with disease progression.  CAT has a scoring range of zero to 40. The CAT score is classified into four groups of low (less than 10), medium (10 - 20), high (21-30) and very high (31-40) based on the impact level of disease on health status. A CAT score over 10 suggests significant symptoms.  A worsening CAT score could  be explained by an exacerbation, poor medication adherence, poor inhaler technique, or progression of COPD or comorbid conditions.  CAT MCID is 2 points  mMRC: mMRC (Modified Medical Research Council) Dyspnea Scale is used to assess the degree of baseline functional disability in patients of respiratory disease due to dyspnea. No minimal important difference is established. A decrease in score of 1 point or greater is considered a positive change.   Pulmonary Function Assessment:  Pulmonary Function Assessment - 03/26/21 1039       Breath   Bilateral Breath Sounds Decreased   Decreased in the bases   Shortness of Breath Yes;Limiting activity   She feels that she knows how to handle her SOB with PLB,Inhaler and 911 if neessary.SOB done not fear or panic her.            Exercise Target Goals: Exercise Program Goal: Individual exercise prescription set using results from initial 6 min walk test and THRR while considering  patients activity barriers and safety.   Exercise Prescription Goal: Initial exercise prescription builds to 30-45 minutes a day of aerobic activity, 2-3 days per week.  Home exercise guidelines will be given to patient during program as part of exercise prescription that the participant will acknowledge.  Activity Barriers & Risk Stratification:  Activity Barriers & Cardiac Risk Stratification - 03/26/21 1011       Activity Barriers & Cardiac Risk Stratification   Activity Barriers Deconditioning;Muscular Weakness;Shortness of Breath    Cardiac Risk Stratification Low             6 Minute Walk:  6 Minute Walk     Row Name 03/26/21 1104         6 Minute Walk   Phase Initial     Distance 1710 feet     Walk Time 6 minutes     # of Rest Breaks 0     MPH 3.24     METS 3.68     RPE 11     Perceived Dyspnea  1     VO2 Peak 12.89     Symptoms No     Resting HR 82 bpm     Resting BP 112/60     Resting Oxygen Saturation  95 %     Exercise Oxygen  Saturation  during 6 min walk 89 %     Max Ex. HR 97 bpm     Max Ex. BP 120/60     2 Minute Post BP 118/60       Interval HR   1 Minute HR 90     2 Minute HR 92     3 Minute HR 92     4 Minute HR 95     5 Minute HR 97     6  Minute HR 90     2 Minute Post HR 76     Interval Heart Rate? Yes       Interval Oxygen   Interval Oxygen? Yes     Baseline Oxygen Saturation % 95 %     1 Minute Oxygen Saturation % 91 %     1 Minute Liters of Oxygen 0 L     2 Minute Oxygen Saturation % 90 %     2 Minute Liters of Oxygen 0 L     3 Minute Oxygen Saturation % 90 %     3 Minute Liters of Oxygen 0 L     4 Minute Oxygen Saturation % 89 %     4 Minute Liters of Oxygen 0 L     5 Minute Oxygen Saturation % 90 %     5 Minute Liters of Oxygen 0 L     6 Minute Oxygen Saturation % 91 %     6 Minute Liters of Oxygen 0 L     2 Minute Post Oxygen Saturation % 95 %     2 Minute Post Liters of Oxygen 0 L              Oxygen Initial Assessment:  Oxygen Initial Assessment - 03/26/21 1014       Home Oxygen   Home Oxygen Device None    Sleep Oxygen Prescription None    Home Exercise Oxygen Prescription None    Home Resting Oxygen Prescription None      Initial 6 min Walk   Oxygen Used None      Program Oxygen Prescription   Program Oxygen Prescription None      Intervention   Short Term Goals To learn and understand importance of monitoring SPO2 with pulse oximeter and demonstrate accurate use of the pulse oximeter.;To learn and understand importance of maintaining oxygen saturations>88%;To learn and demonstrate proper pursed lip breathing techniques or other breathing techniques. ;To learn and demonstrate proper use of respiratory medications    Long  Term Goals Verbalizes importance of monitoring SPO2 with pulse oximeter and return demonstration;Maintenance of O2 saturations>88%;Exhibits proper breathing techniques, such as pursed lip breathing or other method taught during program  session;Compliance with respiratory medication;Demonstrates proper use of MDIs             Oxygen Re-Evaluation:   Oxygen Discharge (Final Oxygen Re-Evaluation):   Initial Exercise Prescription:  Initial Exercise Prescription - 03/26/21 1100       Date of Initial Exercise RX and Referring Provider   Date 03/26/21    Referring Provider Baltazar Najjar coverage   Expected Discharge Date 05/29/21      Recumbant Bike   Level 2    Minutes 15    METs 3      Track   Minutes 15    METs 3.68      Prescription Details   Frequency (times per week) 2    Duration Progress to 30 minutes of continuous aerobic without signs/symptoms of physical distress      Intensity   THRR 40-80% of Max Heartrate 60-120    Ratings of Perceived Exertion 11-13    Perceived Dyspnea 0-4      Progression   Progression Continue progressive overload as per policy without signs/symptoms or physical distress.      Resistance Training   Training Prescription Yes    Weight red bands    Reps 10-15  Perform Capillary Blood Glucose checks as needed.  Exercise Prescription Changes:   Exercise Comments:   Exercise Goals and Review:   Exercise Goals     Row Name 03/26/21 1058             Exercise Goals   Increase Physical Activity Yes       Intervention Provide advice, education, support and counseling about physical activity/exercise needs.;Develop an individualized exercise prescription for aerobic and resistive training based on initial evaluation findings, risk stratification, comorbidities and participant's personal goals.       Expected Outcomes Short Term: Attend rehab on a regular basis to increase amount of physical activity.;Long Term: Add in home exercise to make exercise part of routine and to increase amount of physical activity.;Long Term: Exercising regularly at least 3-5 days a week.       Increase Strength and Stamina Yes       Intervention Provide advice,  education, support and counseling about physical activity/exercise needs.;Develop an individualized exercise prescription for aerobic and resistive training based on initial evaluation findings, risk stratification, comorbidities and participant's personal goals.       Expected Outcomes Short Term: Increase workloads from initial exercise prescription for resistance, speed, and METs.;Short Term: Perform resistance training exercises routinely during rehab and add in resistance training at home;Long Term: Improve cardiorespiratory fitness, muscular endurance and strength as measured by increased METs and functional capacity (6MWT)       Able to understand and use rate of perceived exertion (RPE) scale Yes       Intervention Provide education and explanation on how to use RPE scale       Expected Outcomes Short Term: Able to use RPE daily in rehab to express subjective intensity level;Long Term:  Able to use RPE to guide intensity level when exercising independently       Able to understand and use Dyspnea scale Yes       Intervention Provide education and explanation on how to use Dyspnea scale       Expected Outcomes Short Term: Able to use Dyspnea scale daily in rehab to express subjective sense of shortness of breath during exertion;Long Term: Able to use Dyspnea scale to guide intensity level when exercising independently       Knowledge and understanding of Target Heart Rate Range (THRR) Yes       Intervention Provide education and explanation of THRR including how the numbers were predicted and where they are located for reference       Expected Outcomes Short Term: Able to state/look up THRR;Long Term: Able to use THRR to govern intensity when exercising independently;Short Term: Able to use daily as guideline for intensity in rehab       Understanding of Exercise Prescription Yes       Intervention Provide education, explanation, and written materials on patient's individual exercise prescription        Expected Outcomes Short Term: Able to explain program exercise prescription;Long Term: Able to explain home exercise prescription to exercise independently                Exercise Goals Re-Evaluation :   Discharge Exercise Prescription (Final Exercise Prescription Changes):   Nutrition:  Target Goals: Understanding of nutrition guidelines, daily intake of sodium 1500mg , cholesterol 200mg , calories 30% from fat and 7% or less from saturated fats, daily to have 5 or more servings of fruits and vegetables.  Biometrics:  Pre Biometrics - 03/26/21 1014  Pre Biometrics   Grip Strength 33 kg   left hand             Nutrition Therapy Plan and Nutrition Goals:   Nutrition Assessments:  MEDIFICTS Score Key: ?70 Need to make dietary changes  40-70 Heart Healthy Diet ? 40 Therapeutic Level Cholesterol Diet   Picture Your Plate Scores: <65 Unhealthy dietary pattern with much room for improvement. 41-50 Dietary pattern unlikely to meet recommendations for good health and room for improvement. 51-60 More healthful dietary pattern, with some room for improvement.  >60 Healthy dietary pattern, although there may be some specific behaviors that could be improved.    Nutrition Goals Re-Evaluation:   Nutrition Goals Discharge (Final Nutrition Goals Re-Evaluation):   Psychosocial: Target Goals: Acknowledge presence or absence of significant depression and/or stress, maximize coping skills, provide positive support system. Participant is able to verbalize types and ability to use techniques and skills needed for reducing stress and depression.  Initial Review & Psychosocial Screening:  Initial Psych Review & Screening - 03/26/21 1038       Initial Review   Current issues with None Identified      Family Dynamics   Good Support System? Yes   She has no family in the area but her spirital family is a strong support system for her     Barriers   Psychosocial  barriers to participate in program There are no identifiable barriers or psychosocial needs.      Screening Interventions   Interventions Encouraged to exercise             Quality of Life Scores:  Scores of 19 and below usually indicate a poorer quality of life in these areas.  A difference of  2-3 points is a clinically meaningful difference.  A difference of 2-3 points in the total score of the Quality of Life Index has been associated with significant improvement in overall quality of life, self-image, physical symptoms, and general health in studies assessing change in quality of life.  PHQ-9: Recent Review Flowsheet Data     Depression screen [facility] 2/9 03/26/2021 09/27/2015   Decreased Interest 0 0   Down, Depressed, Hopeless 0 0   PHQ - 2 Score 0 0   Altered sleeping 0 -   Tired, decreased energy 1 -   Change in appetite 0 -   Feeling bad or failure about yourself  0 -   Trouble concentrating 0 -   Moving slowly or fidgety/restless 0 -   Suicidal thoughts 0 -   PHQ-9 Score 1 -   Difficult doing work/chores Not difficult at all -      Interpretation of Total Score  Total Score Depression Severity:  1-4 = Minimal depression, 5-9 = Mild depression, 10-14 = Moderate depression, 15-19 = Moderately severe depression, 20-27 = Severe depression   Psychosocial Evaluation and Intervention:  Psychosocial Evaluation - 03/26/21 1056       Psychosocial Evaluation & Interventions   Interventions Encouraged to exercise with the program and follow exercise prescription    Comments Alonzo has no psychosocial concerns or barriers identified    Expected Outcomes For her to be able to participate in the PR program with no psychosocial  issues or concerns    Continue Psychosocial Services  Follow up required by staff             Psychosocial Re-Evaluation:   Psychosocial Discharge (Final Psychosocial Re-Evaluation):   Education: Education Goals: Education classes will be  provided on a weekly basis, covering required topics. Participant will state understanding/return demonstration of topics presented.  Learning Barriers/Preferences:  Learning Barriers/Preferences - 03/26/21 1108       Learning Barriers/Preferences   Learning Barriers None    Learning Preferences Written Material;Computer/Internet;Verbal Instruction;Audio             Education Topics: Risk Factor Reduction:  -Group instruction that is supported by a PowerPoint presentation. Instructor discusses the definition of a risk factor, different risk factors for pulmonary disease, and how the heart and lungs work together.     Nutrition for Pulmonary Patient:  -Group instruction provided by PowerPoint slides, verbal discussion, and written materials to support subject matter. The instructor gives an explanation and review of healthy diet recommendations, which includes a discussion on weight management, recommendations for fruit and vegetable consumption, as well as protein, fluid, caffeine, fiber, sodium, sugar, and alcohol. Tips for eating when patients are short of breath are discussed. Flowsheet Row PULMONARY REHAB CHRONIC OBSTRUCTIVE PULMONARY DISEASE from 02/13/2016 in [place]  Date 01/09/16  [facility] Eating During the [place]  Educator RD  Instruction Review Code (Retired) 2- meets goals/outcomes       Pursed Lip Breathing:  -Group instruction that is supported by demonstration and informational handouts. Instructor discusses the benefits of pursed lip and diaphragmatic breathing and detailed demonstration on how to preform both.   Flowsheet Row PULMONARY REHAB CHRONIC OBSTRUCTIVE PULMONARY DISEASE from 02/13/2016 in [place]  Date 12/12/15  Educator EP  Instruction Review Code (Retired) 2- meets goals/outcomes       Oxygen Safety:  -Group instruction provided by PowerPoint, verbal discussion, and written  material to support subject matter. There is an overview of What is Oxygen and Why do we need it.  Instructor also reviews how to create a safe environment for oxygen use, the importance of using oxygen as prescribed, and the risks of noncompliance. There is a brief discussion on traveling with oxygen and resources the patient may utilize. Flowsheet Row PULMONARY REHAB CHRONIC OBSTRUCTIVE PULMONARY DISEASE from 02/13/2016 in [place]  Date 01/02/16  Educator rn  Instruction Review Code (Retired) 2- meets goals/outcomes       Oxygen Equipment:  -Group instruction provided by [employer], Network engineer, and Insurance underwriter. Flowsheet Row PULMONARY REHAB CHRONIC OBSTRUCTIVE PULMONARY DISEASE from 02/13/2016 in [place]  Date 01/16/16  Educator Rep  Instruction Review Code (Retired) 2- meets goals/outcomes       Signs and Symptoms:  -Group instruction provided by written material and verbal discussion to support subject matter. Warning signs and symptoms of infection, stroke, and heart attack are reviewed and when to call the physician/911 reinforced. Tips for preventing the spread of infection discussed. Flowsheet Row PULMONARY REHAB CHRONIC OBSTRUCTIVE PULMONARY DISEASE from 02/13/2016 in [place]  Date 02/06/16  Educator rn  Instruction Review Code (Retired) R- Review/reinforce       Advanced Directives:  -Group instruction provided by verbal instruction and written material to support subject matter. Instructor reviews Advanced Directive laws and proper instruction for filling out document.   Pulmonary Video:  -Group video education that reviews the importance of medication and oxygen compliance, exercise, good nutrition, pulmonary hygiene, and pursed lip and diaphragmatic breathing for the pulmonary patient. Flowsheet Row PULMONARY REHAB  CHRONIC OBSTRUCTIVE PULMONARY DISEASE from 02/13/2016 in [place]  Date 12/19/15  Educator video  Instruction Review Code (Retired) 2- meets goals/outcomes       Exercise for the Pulmonary Patient:  -Group instruction that is supported by a PowerPoint presentation. Instructor discusses benefits of exercise, core components of exercise, frequency, duration, and intensity of an exercise routine, importance of utilizing pulse oximetry during exercise, safety while exercising, and options of places to exercise outside of rehab.   Flowsheet Row PULMONARY REHAB CHRONIC OBSTRUCTIVE PULMONARY DISEASE from 02/13/2016 in [place]  Date 01/30/16  Educator EP  Instruction Review Code (Retired) 2- meets goals/outcomes       Pulmonary Medications:  -Engineer, maintenance (IT) group education provided by Art therapist with focus on inhaled medications and proper administration.   Anatomy and Physiology of the Respiratory System and Intimacy:  -Group instruction provided by PowerPoint, verbal discussion, and written material to support subject matter. Instructor reviews respiratory cycle and anatomical components of the respiratory system and their functions. Instructor also reviews differences in obstructive and restrictive respiratory diseases with examples of each. Intimacy, Sex, and Sexuality differences are reviewed with a discussion on how relationships can change when diagnosed with pulmonary disease. Common sexual concerns are reviewed. Flowsheet Row PULMONARY REHAB CHRONIC OBSTRUCTIVE PULMONARY DISEASE from 02/13/2016 in [place]  Date 02/13/16  Educator rn  Instruction Review Code (Retired) 2- meets goals/outcomes       MD DAY -A group question and answer session with a medical doctor that allows participants to ask questions that relate to their pulmonary disease state.   OTHER  EDUCATION -Group or individual verbal, written, or video instructions that support the educational goals of the pulmonary rehab program.   Holiday Eating Survival Tips:  -Group instruction provided by PowerPoint slides, verbal discussion, and written materials to support subject matter. The instructor gives patients tips, tricks, and techniques to help them not only survive but enjoy the holidays despite the onslaught of food that accompanies the holidays.   Knowledge Questionnaire Score:  Knowledge Questionnaire Score - 03/26/21 1031       Knowledge Questionnaire Score   Pre Score 17/18             Core Components/Risk Factors/Patient Goals at Admission:  Personal Goals and Risk Factors at Admission - 03/26/21 1100       Core Components/Risk Factors/Patient Goals on Admission   Improve shortness of breath with ADL's Yes    Intervention Provide education, individualized exercise plan and daily activity instruction to help decrease symptoms of SOB with activities of daily living.    Expected Outcomes Short Term: Improve cardiorespiratory fitness to achieve a reduction of symptoms when performing ADLs;Long Term: Be able to perform more ADLs without symptoms or delay the onset of symptoms    Increase knowledge of respiratory medications and ability to use respiratory devices properly  Yes    Intervention Provide education and demonstration as needed of appropriate use of medications, inhalers, and oxygen therapy.    Expected Outcomes Short Term: Achieves understanding of medications use. Understands that oxygen is a medication prescribed by physician. Demonstrates appropriate use of inhaler and oxygen therapy.;Long Term: Maintain appropriate use of medications, inhalers, and oxygen therapy.             Core Components/Risk Factors/Patient Goals Review:    Core Components/Risk Factors/Patient Goals at Discharge (Final Review):    ITP Comments:   Comments: Dr. Rodman Pickle  is Medical Director for Pulmonary Rehab at [facility].

## 2021-03-26 NOTE — Progress Notes (Signed)
Patricia Johnston 71 y.o. female Pulmonary Rehab Orientation Note This patient who was referred to [facility] Rehab [facility] Gwenette Greet with Dr. Loanne Drilling covering with the diagnosis of COPD arrived today in Cardiac and Pulmonary Rehab. She  arrived with ambulatory normal gait. She  does not carry portable oxygen. Color good, skin warm and dry. Patient is oriented to time and place. Patient's medical history, psychosocial health, and medications reviewed. Psychosocial assessment reveals pt lives with alone. Patricia Johnston is currently retired. Pt hobbies include gardening,walking,reading,cooking and out serving with her spiritual family. Pt reports her stress level is low. Areas of stress/anxiety include her health but she reports that this is a very low level for her.Pt does not exhibit signs of depression. PHQ 2/9 score 0/1. Patricia Johnston shows good  coping skills with positive outlook on life. Offered emotional support and reassurance. Will continue to monitor and evaluate for psychosocial needs or barriers. Physical assessment reveals heart rate is normal, breath sounds clear to auscultation, no wheezes, rales, or rhonchi. Grip strength equal, strong. Distal pulses present. Patricia Johnston reports she does take medications as prescribed. Patient states she follows a Regular diet. The patient reports no specific efforts to gain or lose weight.. Pt's weight will be monitored closely. Demonstration and practice of PLB using pulse oximeter. Patricia Johnston able to return demonstration satisfactorily. Safety and hand hygiene in the exercise area reviewed with patient. Patricia Johnston voices understanding of the information reviewed. Department expectations discussed with patient and achievable goals were set. The patient shows enthusiasm about attending the program and we look forward to working with Patricia Johnston. Patricia Johnston completed a 6 min walk test today and is scheduled to begin exercise on 04/01/21 at the 1:15 pm class.   [id number] Patricia Johnston

## 2021-03-26 NOTE — Progress Notes (Signed)
Patricia Johnston 71 y.o. female  Initial Psychosocial Assessment  Pt psychosocial assessment reveals pt lives alone. Pt is currently retired. Pt hobbies include gardening, walking reading, cooking and serving with her spiritual family. Pt reports her stress level is low. Areas of stress/anxiety include Health.  Pt does not exhibit any signs of depression. Pt shows good  coping skills with positive outlook .Offered emotional support and reassurance. Will continue to monitor and evaluate for any psychosocial issues or barriers.  Goal(s):  Help patient work toward returning to meaningful activities that improve patient's QOL and are attainable with patient's lung disease For [facility] to be able to participate in the PR program with no psychosocial barriers or concerns  03/26/2021 11:20 AM

## 2021-03-30 ENCOUNTER — Other Ambulatory Visit: Payer: Self-pay | Admitting: Pharmacy Technician

## 2021-04-01 ENCOUNTER — Encounter (HOSPITAL_COMMUNITY)
Admission: RE | Admit: 2021-04-01 | Discharge: 2021-04-01 | Disposition: A | Discharge status: Home / Self Care | Payer: No Typology Code available for payment source | Source: Ambulatory Visit | Attending: Pulmonary Disease | Admitting: Pulmonary Disease

## 2021-04-01 ENCOUNTER — Other Ambulatory Visit: Payer: Self-pay

## 2021-04-01 DIAGNOSIS — J449 Chronic obstructive pulmonary disease, unspecified: Secondary | ICD-10-CM | POA: Diagnosis not present

## 2021-04-01 NOTE — Progress Notes (Signed)
Daily Session Note  Patient Details  Name: Patricia Johnston MRN: [id number] Date of Birth: December 07, 1950 Referring Provider:   April Manson Pulmonary Rehab Walk Test from 03/26/2021 in [place]  Referring Provider Clance  [[place] coverage]       Encounter Date: 04/01/2021  Check In:  Session Check In - 04/01/21 1506       Check-In   Supervising physician immediately available to respond to emergencies [facility] immediately available    Physician(s) Dr. Avon Gully    Location [facility]    Staff Present Rosebud Poles, RN, Deland Pretty, MS, ACSM CEP, Exercise Physiologist;Lisa Ysidro Evert, RN    Virtual Visit No    Medication changes reported     No    Fall or balance concerns reported    No    Tobacco Cessation No Change    Warm-up and Cool-down Not performed (comment)    Resistance Training Performed No    VAD Patient? No    PAD/SET Patient? No      Pain Assessment   Currently in Pain? No/denies    Multiple Pain Sites No             Capillary Blood Glucose: No results found for this or any previous visit (from the past 24 hour(s)).    Social History   Tobacco Use  Smoking Status Never  Smokeless Tobacco Never    Goals Met:  Proper associated with RPD/PD & O2 Sat Exercise tolerated well No report of concerns or symptoms today Strength training completed today  Goals Unmet:  Not Applicable  Comments: Service time is from 1317 to 1406.    Dr. Rodman Pickle is Medical Director for Pulmonary Rehab at [facility].

## 2021-04-02 NOTE — Progress Notes (Addendum)
Pulmonary Individual Treatment Plan  Patient Details  Name: Patricia Johnston MRN: [id number] Date of Birth: 1950-11-21 Referring Provider:   April Manson Pulmonary Rehab Walk Test from 03/26/2021 in [place]  Referring Provider Clance  [[place] coverage]       Initial Encounter Date:  Flowsheet Row Pulmonary Rehab Walk Test from 03/26/2021 in [place]  Date 03/26/21       Visit Diagnosis: Chronic obstructive pulmonary disease, unspecified COPD type ([place])  Patient's Home Medications on Admission:   Current Outpatient Medications:    albuterol (PROVENTIL) (2.5 MG/3ML) 0.083% nebulizer solution, Take 2.5 mg by nebulization as needed for wheezing or shortness of breath., Disp: , Rfl:    albuterol (VENTOLIN HFA) 108 (90 Base) MCG/ACT inhaler, Inhale 2 puffs into the lungs every 4 (four) hours as needed for wheezing or shortness of breath., Disp: 1 Inhaler, Rfl: 1   alendronate (FOSAMAX) 70 MG tablet, Take 70 mg by mouth every Monday. Take with a full glass of water on an empty stomach., Disp: , Rfl:    Calcium Carb-Cholecalciferol (CALCIUM 500+D PO), Take 1 tablet by mouth daily., Disp: , Rfl:    diphenhydrAMINE (BENADRYL) 25 MG tablet, Take 25 mg by mouth every 6 (six) hours as needed (anxiety)., Disp: , Rfl:    feeding supplement, ENSURE ENLIVE, (ENSURE ENLIVE) LIQD, Take 237 mLs by mouth 2 (two) times daily between meals. (Patient not taking: Reported on 03/26/2021), Disp: 237 mL, Rfl: 12   guaiFENesin (MUCINEX) 600 MG 12 hr tablet, Take 1 tablet (600 mg total) by mouth 2 (two) times daily., Disp: 30 tablet, Rfl: 0   mepolizumab (NUCALA) 100 MG/ML SOSY, INJECT 100MG  (1 PREFILL SYRINGE) SUBCUTANEOUSLY EVERY 28 DAYS (APPROVED) FOR CLINIC INFUSION IN [place] ONLY, Disp: , Rfl:    mometasone (ASMANEX) 220 MCG/INH inhaler, Inhale 1 puff into the lungs 2 (two) times daily., Disp: 1 each, Rfl: 11   Multiple  Vitamins-Minerals (MULTIVITAMIN WITH MINERALS) tablet, Take 2 tablets by mouth daily., Disp: , Rfl:    Tiotropium Bromide-Olodaterol 2.5-2.5 MCG/ACT AERS, Inhale 2 puffs into the lungs daily., Disp: 1 each, Rfl: 11  Past Medical History: Past Medical History:  Diagnosis Date   Anxiety    Asthma    COPD (chronic obstructive pulmonary disease) ([place])    pt states due to [employer] service contracted COPD   Difficult intubation    Pt does not remember details over 30 years ago   Eczema    Esophageal thickening 10/13/2018   on CT- [facility]   GERD (gastroesophageal reflux disease)    Osteoporosis    PONV (postoperative nausea and vomiting)    Pulmonary emphysema (HCC)    Pulmonary nodules    Traumatic arthritis    Umbilical hernia    Wears glasses     Tobacco Use: Social History   Tobacco Use  Smoking Status Never  Smokeless Tobacco Never    Labs: Recent Review Flowsheet Data   There is no flowsheet data to display.     Capillary Blood Glucose: Lab Results  Component Value Date   GLUCAP 99 06/16/2017     Pulmonary Assessment Scores:  Pulmonary Assessment Scores     Row Name 03/26/21 1031         ADL UCSD   ADL Phase Entry     SOB Score total 60       CAT Score   CAT Score 29  UCSD: Self-administered rating of dyspnea associated with activities of daily living (ADLs) 6-point scale (0 = "not at all" to 5 = "maximal or unable to do because of breathlessness")  Scoring Scores range from 0 to 120.  Minimally important difference is 5 units  CAT: CAT can identify the health impairment of COPD patients and is better correlated with disease progression.  CAT has a scoring range of zero to 40. The CAT score is classified into four groups of low (less than 10), medium (10 - 20), high (21-30) and very high (31-40) based on the impact level of disease on health status. A CAT score over 10 suggests significant symptoms.  A worsening  CAT score could be explained by an exacerbation, poor medication adherence, poor inhaler technique, or progression of COPD or comorbid conditions.  CAT MCID is 2 points  mMRC: mMRC (Modified Medical Research Council) Dyspnea Scale is used to assess the degree of baseline functional disability in patients of respiratory disease due to dyspnea. No minimal important difference is established. A decrease in score of 1 point or greater is considered a positive change.   Pulmonary Function Assessment:  Pulmonary Function Assessment - 03/26/21 1039       Breath   Bilateral Breath Sounds Decreased   Decreased in the bases   Shortness of Breath Yes;Limiting activity   She feels that she knows how to handle her SOB with PLB,Inhaler and 911 if neessary.SOB done not fear or panic her.            Exercise Target Goals: Exercise Program Goal: Individual exercise prescription set using results from initial 6 min walk test and THRR while considering  patients activity barriers and safety.   Exercise Prescription Goal: Initial exercise prescription builds to 30-45 minutes a day of aerobic activity, 2-3 days per week.  Home exercise guidelines will be given to patient during program as part of exercise prescription that the participant will acknowledge.  Activity Barriers & Risk Stratification:  Activity Barriers & Cardiac Risk Stratification - 03/26/21 1011       Activity Barriers & Cardiac Risk Stratification   Activity Barriers Deconditioning;Muscular Weakness;Shortness of Breath    Cardiac Risk Stratification Low             6 Minute Walk:  6 Minute Walk     Row Name 03/26/21 1104         6 Minute Walk   Phase Initial     Distance 1710 feet     Walk Time 6 minutes     # of Rest Breaks 0     MPH 3.24     METS 3.68     RPE 11     Perceived Dyspnea  1     VO2 Peak 12.89     Symptoms No     Resting HR 82 bpm     Resting BP 112/60     Resting Oxygen Saturation  95 %      Exercise Oxygen Saturation  during 6 min walk 89 %     Max Ex. HR 97 bpm     Max Ex. BP 120/60     2 Minute Post BP 118/60       Interval HR   1 Minute HR 90     2 Minute HR 92     3 Minute HR 92     4 Minute HR 95     5 Minute HR 97     6  Minute HR 90     2 Minute Post HR 76     Interval Heart Rate? Yes       Interval Oxygen   Interval Oxygen? Yes     Baseline Oxygen Saturation % 95 %     1 Minute Oxygen Saturation % 91 %     1 Minute Liters of Oxygen 0 L     2 Minute Oxygen Saturation % 90 %     2 Minute Liters of Oxygen 0 L     3 Minute Oxygen Saturation % 90 %     3 Minute Liters of Oxygen 0 L     4 Minute Oxygen Saturation % 89 %     4 Minute Liters of Oxygen 0 L     5 Minute Oxygen Saturation % 90 %     5 Minute Liters of Oxygen 0 L     6 Minute Oxygen Saturation % 91 %     6 Minute Liters of Oxygen 0 L     2 Minute Post Oxygen Saturation % 95 %     2 Minute Post Liters of Oxygen 0 L              Oxygen Initial Assessment:  Oxygen Initial Assessment - 03/26/21 1014       Home Oxygen   Home Oxygen Device None    Sleep Oxygen Prescription None    Home Exercise Oxygen Prescription None    Home Resting Oxygen Prescription None      Initial 6 min Walk   Oxygen Used None      Program Oxygen Prescription   Program Oxygen Prescription None      Intervention   Short Term Goals To learn and understand importance of monitoring SPO2 with pulse oximeter and demonstrate accurate use of the pulse oximeter.;To learn and understand importance of maintaining oxygen saturations>88%;To learn and demonstrate proper pursed lip breathing techniques or other breathing techniques. ;To learn and demonstrate proper use of respiratory medications    Long  Term Goals Verbalizes importance of monitoring SPO2 with pulse oximeter and return demonstration;Maintenance of O2 saturations>88%;Exhibits proper breathing techniques, such as pursed lip breathing or other method taught during  program session;Compliance with respiratory medication;Demonstrates proper use of MDIs             Oxygen Re-Evaluation:   Oxygen Discharge (Final Oxygen Re-Evaluation):   Initial Exercise Prescription:  Initial Exercise Prescription - 03/26/21 1100       Date of Initial Exercise RX and Referring Provider   Date 03/26/21    Referring Provider Baltazar Najjar coverage   Expected Discharge Date 05/29/21      Recumbant Bike   Level 2    Minutes 15    METs 3      Track   Minutes 15    METs 3.68      Prescription Details   Frequency (times per week) 2    Duration Progress to 30 minutes of continuous aerobic without signs/symptoms of physical distress      Intensity   THRR 40-80% of Max Heartrate 60-120    Ratings of Perceived Exertion 11-13    Perceived Dyspnea 0-4      Progression   Progression Continue progressive overload as per policy without signs/symptoms or physical distress.      Resistance Training   Training Prescription Yes    Weight red bands    Reps 10-15  Perform Capillary Blood Glucose checks as needed.  Exercise Prescription Changes:   Exercise Comments:   Exercise Goals and Review:   Exercise Goals     Row Name 03/26/21 1058             Exercise Goals   Increase Physical Activity Yes       Intervention Provide advice, education, support and counseling about physical activity/exercise needs.;Develop an individualized exercise prescription for aerobic and resistive training based on initial evaluation findings, risk stratification, comorbidities and participant's personal goals.       Expected Outcomes Short Term: Attend rehab on a regular basis to increase amount of physical activity.;Long Term: Add in home exercise to make exercise part of routine and to increase amount of physical activity.;Long Term: Exercising regularly at least 3-5 days a week.       Increase Strength and Stamina Yes       Intervention Provide  advice, education, support and counseling about physical activity/exercise needs.;Develop an individualized exercise prescription for aerobic and resistive training based on initial evaluation findings, risk stratification, comorbidities and participant's personal goals.       Expected Outcomes Short Term: Increase workloads from initial exercise prescription for resistance, speed, and METs.;Short Term: Perform resistance training exercises routinely during rehab and add in resistance training at home;Long Term: Improve cardiorespiratory fitness, muscular endurance and strength as measured by increased METs and functional capacity (6MWT)       Able to understand and use rate of perceived exertion (RPE) scale Yes       Intervention Provide education and explanation on how to use RPE scale       Expected Outcomes Short Term: Able to use RPE daily in rehab to express subjective intensity level;Long Term:  Able to use RPE to guide intensity level when exercising independently       Able to understand and use Dyspnea scale Yes       Intervention Provide education and explanation on how to use Dyspnea scale       Expected Outcomes Short Term: Able to use Dyspnea scale daily in rehab to express subjective sense of shortness of breath during exertion;Long Term: Able to use Dyspnea scale to guide intensity level when exercising independently       Knowledge and understanding of Target Heart Rate Range (THRR) Yes       Intervention Provide education and explanation of THRR including how the numbers were predicted and where they are located for reference       Expected Outcomes Short Term: Able to state/look up THRR;Long Term: Able to use THRR to govern intensity when exercising independently;Short Term: Able to use daily as guideline for intensity in rehab       Understanding of Exercise Prescription Yes       Intervention Provide education, explanation, and written materials on patient's individual exercise  prescription       Expected Outcomes Short Term: Able to explain program exercise prescription;Long Term: Able to explain home exercise prescription to exercise independently                Exercise Goals Re-Evaluation :   Discharge Exercise Prescription (Final Exercise Prescription Changes):   Nutrition:  Target Goals: Understanding of nutrition guidelines, daily intake of sodium 1500mg , cholesterol 200mg , calories 30% from fat and 7% or less from saturated fats, daily to have 5 or more servings of fruits and vegetables.  Biometrics:  Pre Biometrics - 03/26/21 1014  Pre Biometrics   Grip Strength 33 kg   left hand             Nutrition Therapy Plan and Nutrition Goals:   Nutrition Assessments:  MEDIFICTS Score Key: ?70 Need to make dietary changes  40-70 Heart Healthy Diet ? 40 Therapeutic Level Cholesterol Diet   Picture Your Plate Scores: <96 Unhealthy dietary pattern with much room for improvement. 41-50 Dietary pattern unlikely to meet recommendations for good health and room for improvement. 51-60 More healthful dietary pattern, with some room for improvement.  >60 Healthy dietary pattern, although there may be some specific behaviors that could be improved.    Nutrition Goals Re-Evaluation:   Nutrition Goals Discharge (Final Nutrition Goals Re-Evaluation):   Psychosocial: Target Goals: Acknowledge presence or absence of significant depression and/or stress, maximize coping skills, provide positive support system. Participant is able to verbalize types and ability to use techniques and skills needed for reducing stress and depression.  Initial Review & Psychosocial Screening:  Initial Psych Review & Screening - 03/26/21 1038       Initial Review   Current issues with None Identified      Family Dynamics   Good Support System? Yes   She has no family in the area but her spirital family is a strong support system for her     Barriers    Psychosocial barriers to participate in program There are no identifiable barriers or psychosocial needs.      Screening Interventions   Interventions Encouraged to exercise             Quality of Life Scores:  Scores of 19 and below usually indicate a poorer quality of life in these areas.  A difference of  2-3 points is a clinically meaningful difference.  A difference of 2-3 points in the total score of the Quality of Life Index has been associated with significant improvement in overall quality of life, self-image, physical symptoms, and general health in studies assessing change in quality of life.  PHQ-9: Recent Review Flowsheet Data     Depression screen [facility] 2/9 03/26/2021 09/27/2015   Decreased Interest 0 0   Down, Depressed, Hopeless 0 0   PHQ - 2 Score 0 0   Altered sleeping 0 -   Tired, decreased energy 1 -   Change in appetite 0 -   Feeling bad or failure about yourself  0 -   Trouble concentrating 0 -   Moving slowly or fidgety/restless 0 -   Suicidal thoughts 0 -   PHQ-9 Score 1 -   Difficult doing work/chores Not difficult at all -      Interpretation of Total Score  Total Score Depression Severity:  1-4 = Minimal depression, 5-9 = Mild depression, 10-14 = Moderate depression, 15-19 = Moderately severe depression, 20-27 = Severe depression   Psychosocial Evaluation and Intervention:  Psychosocial Evaluation - 03/26/21 1056       Psychosocial Evaluation & Interventions   Interventions Encouraged to exercise with the program and follow exercise prescription    Comments Ileen has no psychosocial concerns or barriers identified    Expected Outcomes For her to be able to participate in the PR program with no psychosocial  issues or concerns    Continue Psychosocial Services  Follow up required by staff             Psychosocial Re-Evaluation:  Psychosocial Re-Evaluation     [place] Name 04/02/21 1005  Psychosocial Re-Evaluation    Current issues with None Identified       Comments Folashade is just getting started in the PR program and has attended one exercise session. It is to soon to re evaluate her psychosocial barriers or concerns.       Expected Outcomes For Malaka to be able to particiate in the [place] program with no psychosocial barriers or concerns       Interventions Encouraged to attend Pulmonary Rehabilitation for the exercise       Continue Psychosocial Services  No Follow up required                Psychosocial Discharge (Final Psychosocial Re-Evaluation):  Psychosocial Re-Evaluation - 04/02/21 1005       Psychosocial Re-Evaluation   Current issues with None Identified    Comments Tristyn is just getting started in the PR program and has attended one exercise session. It is to soon to re evaluate her psychosocial barriers or concerns.    Expected Outcomes For Stephania to be able to particiate in the [place] program with no psychosocial barriers or concerns    Interventions Encouraged to attend Pulmonary Rehabilitation for the exercise    Continue Psychosocial Services  No Follow up required             Education: Education Goals: Education classes will be provided on a weekly basis, covering required topics. Participant will state understanding/return demonstration of topics presented.  Learning Barriers/Preferences:  Learning Barriers/Preferences - 03/26/21 1108       Learning Barriers/Preferences   Learning Barriers None    Learning Preferences Written Material;Computer/Internet;Verbal Instruction;Audio             Education Topics: Risk Factor Reduction:  -Group instruction that is supported by a PowerPoint presentation. Instructor discusses the definition of a risk factor, different risk factors for pulmonary disease, and how the heart and lungs work together.     Nutrition for Pulmonary Patient:  -Group instruction provided by PowerPoint slides, verbal discussion, and written  materials to support subject matter. The instructor gives an explanation and review of healthy diet recommendations, which includes a discussion on weight management, recommendations for fruit and vegetable consumption, as well as protein, fluid, caffeine, fiber, sodium, sugar, and alcohol. Tips for eating when patients are short of breath are discussed. Flowsheet Row PULMONARY REHAB CHRONIC OBSTRUCTIVE PULMONARY DISEASE from 02/13/2016 in [place]  Date 01/09/16  [facility] [address] Eating During the [place]  Educator RD  Instruction Review Code (Retired) 2- meets goals/outcomes       Pursed Lip Breathing:  -Group instruction that is supported by demonstration and informational handouts. Instructor discusses the benefits of pursed lip and diaphragmatic breathing and detailed demonstration on how to preform both.   Flowsheet Row PULMONARY REHAB CHRONIC OBSTRUCTIVE PULMONARY DISEASE from 02/13/2016 in [place]  Date 12/12/15  Educator EP  Instruction Review Code (Retired) 2- meets goals/outcomes       Oxygen Safety:  -Group instruction provided by PowerPoint, verbal discussion, and written material to support subject matter. There is an overview of What is Oxygen and Why do we need it.  Instructor also reviews how to create a safe environment for oxygen use, the importance of using oxygen as prescribed, and the risks of noncompliance. There is a brief discussion on traveling with oxygen and resources the patient may utilize. Flowsheet Row PULMONARY REHAB CHRONIC OBSTRUCTIVE PULMONARY DISEASE from 02/13/2016 in [facility]  CARDIAC REHAB  Date 01/02/16  Educator rn  Instruction Review Code (Retired) 2- meets goals/outcomes       Oxygen Equipment:  -Group instruction provided by [employer], Network engineer, and Insurance underwriter. Flowsheet Row PULMONARY REHAB CHRONIC  OBSTRUCTIVE PULMONARY DISEASE from 02/13/2016 in [place]  Date 01/16/16  Educator Rep  Instruction Review Code (Retired) 2- meets goals/outcomes       Signs and Symptoms:  -Group instruction provided by written material and verbal discussion to support subject matter. Warning signs and symptoms of infection, stroke, and heart attack are reviewed and when to call the physician/911 reinforced. Tips for preventing the spread of infection discussed. Flowsheet Row PULMONARY REHAB CHRONIC OBSTRUCTIVE PULMONARY DISEASE from 02/13/2016 in [place]  Date 02/06/16  Educator rn  Instruction Review Code (Retired) R- Review/reinforce       Advanced Directives:  -Group instruction provided by verbal instruction and written material to support subject matter. Instructor reviews Advanced Directive laws and proper instruction for filling out document.   Pulmonary Video:  -Group video education that reviews the importance of medication and oxygen compliance, exercise, good nutrition, pulmonary hygiene, and pursed lip and diaphragmatic breathing for the pulmonary patient. Flowsheet Row PULMONARY REHAB CHRONIC OBSTRUCTIVE PULMONARY DISEASE from 02/13/2016 in [place]  Date 12/19/15  Educator video  Instruction Review Code (Retired) 2- meets goals/outcomes       Exercise for the Pulmonary Patient:  -Group instruction that is supported by a PowerPoint presentation. Instructor discusses benefits of exercise, core components of exercise, frequency, duration, and intensity of an exercise routine, importance of utilizing pulse oximetry during exercise, safety while exercising, and options of places to exercise outside of rehab.   Flowsheet Row PULMONARY REHAB CHRONIC OBSTRUCTIVE PULMONARY DISEASE from 02/13/2016 in [place]  Date 01/30/16  Educator EP  Instruction Review  Code (Retired) 2- meets goals/outcomes       Pulmonary Medications:  -Engineer, maintenance (IT) group education provided by Art therapist with focus on inhaled medications and proper administration.   Anatomy and Physiology of the Respiratory System and Intimacy:  -Group instruction provided by PowerPoint, verbal discussion, and written material to support subject matter. Instructor reviews respiratory cycle and anatomical components of the respiratory system and their functions. Instructor also reviews differences in obstructive and restrictive respiratory diseases with examples of each. Intimacy, Sex, and Sexuality differences are reviewed with a discussion on how relationships can change when diagnosed with pulmonary disease. Common sexual concerns are reviewed. Flowsheet Row PULMONARY REHAB CHRONIC OBSTRUCTIVE PULMONARY DISEASE from 02/13/2016 in [place]  Date 02/13/16  Educator rn  Instruction Review Code (Retired) 2- meets goals/outcomes       MD DAY -A group question and answer session with a medical doctor that allows participants to ask questions that relate to their pulmonary disease state.   OTHER EDUCATION -Group or individual verbal, written, or video instructions that support the educational goals of the pulmonary rehab program.   Holiday Eating Survival Tips:  -Group instruction provided by PowerPoint slides, verbal discussion, and written materials to support subject matter. The instructor gives patients tips, tricks, and techniques to help them not only survive but enjoy the holidays despite the onslaught of food that accompanies the holidays.   Knowledge Questionnaire Score:  Knowledge Questionnaire Score - 03/26/21 1031       Knowledge Questionnaire Score   Pre Score  17/18             Core Components/Risk Factors/Patient Goals at Admission:  Personal Goals and Risk Factors at Admission - 03/26/21 1100       Core  Components/Risk Factors/Patient Goals on Admission   Improve shortness of breath with ADL's Yes    Intervention Provide education, individualized exercise plan and daily activity instruction to help decrease symptoms of SOB with activities of daily living.    Expected Outcomes Short Term: Improve cardiorespiratory fitness to achieve a reduction of symptoms when performing ADLs;Long Term: Be able to perform more ADLs without symptoms or delay the onset of symptoms    Increase knowledge of respiratory medications and ability to use respiratory devices properly  Yes    Intervention Provide education and demonstration as needed of appropriate use of medications, inhalers, and oxygen therapy.    Expected Outcomes Short Term: Achieves understanding of medications use. Understands that oxygen is a medication prescribed by physician. Demonstrates appropriate use of inhaler and oxygen therapy.;Long Term: Maintain appropriate use of medications, inhalers, and oxygen therapy.             Core Components/Risk Factors/Patient Goals Review:   Goals and Risk Factor Review     Row Name 04/02/21 1009             Core Components/Risk Factors/Patient Goals Review   Personal Goals Review Improve shortness of breath with ADL's;Increase knowledge of respiratory medications and ability to use respiratory devices properly.;Develop more efficient breathing techniques such as purse lipped breathing and diaphragmatic breathing and practicing self-pacing with activity.       Review [place] has only had one exercise session but she is highly motivated to participate in the PR program. She is performing well and at a high level even on her first session..       Expected Outcomes For [place] to stay highly motivate to participate in the PR program. To imporve her SOB with her activities and to continue to use more efficent breathing techniques.                Core Components/Risk Factors/Patient Goals at  Discharge (Final Review):   Goals and Risk Factor Review - 04/02/21 1009       Core Components/Risk Factors/Patient Goals Review   Personal Goals Review Improve shortness of breath with ADL's;Increase knowledge of respiratory medications and ability to use respiratory devices properly.;Develop more efficient breathing techniques such as purse lipped breathing and diaphragmatic breathing and practicing self-pacing with activity.    Review Jovonna has only had one exercise session but she is highly motivated to participate in the PR program. She is performing well and at a high level even on her first session..    Expected Outcomes For Amauri to stay highly motivate to participate in the PR program. To imporve her SOB with her activities and to continue to use more efficent breathing techniques.             ITP Comments:   Comments: Dr. Rodman Pickle is Medical Director for Pulmonary Rehab at [facility].

## 2021-04-03 ENCOUNTER — Encounter (HOSPITAL_COMMUNITY)
Admission: RE | Admit: 2021-04-03 | Discharge: 2021-04-03 | Disposition: A | Discharge status: Home / Self Care | Payer: No Typology Code available for payment source | Source: Ambulatory Visit | Attending: Pulmonary Disease | Admitting: Pulmonary Disease

## 2021-04-03 ENCOUNTER — Other Ambulatory Visit: Payer: Self-pay

## 2021-04-03 DIAGNOSIS — J449 Chronic obstructive pulmonary disease, unspecified: Secondary | ICD-10-CM | POA: Diagnosis present

## 2021-04-03 NOTE — Progress Notes (Signed)
Daily Session Note  Patient Details  Name: Patricia Johnston MRN: [id number] Date of Birth: [birth year] Referring Provider:   April Manson Pulmonary Rehab Walk Test from 03/26/2021 in [place]  Referring Provider Clance  [Ellison coverage]       Encounter Date: 04/03/2021  Check In:  Session Check In - 04/03/21 1607       Check-In   Supervising physician immediately available to respond to emergencies [facility] immediately available    Physician(s) Dr. Doristine Bosworth    Location [facility]    Staff Present Lesly Rubenstein, MS, ACSM-CEP, CCRP, Exercise Physiologist;Annedrea Rosezella Florida, RN, MHA;Leemon Ayala Ysidro Evert, RN    Virtual Visit No    Medication changes reported     No    Fall or balance concerns reported    No    Tobacco Cessation No Change    Warm-up and Cool-down Performed as group-led instruction    Resistance Training Performed Yes    VAD Patient? No    PAD/SET Patient? No      Pain Assessment   Currently in Pain? No/denies    Multiple Pain Sites No             Capillary Blood Glucose: No results found for this or any previous visit (from the past 24 hour(s)).    Social History   Tobacco Use  Smoking Status Never  Smokeless Tobacco Never    Goals Met:  Exercise tolerated well No report of concerns or symptoms today Strength training completed today  Goals Unmet:  Not Applicable  Comments: Service time is from 1327 to [place]    Dr. Rodman Pickle is Medical Director for Pulmonary Rehab at [facility].

## 2021-04-08 ENCOUNTER — Other Ambulatory Visit: Payer: Self-pay

## 2021-04-08 ENCOUNTER — Encounter (HOSPITAL_COMMUNITY)
Admission: RE | Admit: 2021-04-08 | Discharge: 2021-04-08 | Disposition: A | Discharge status: Home / Self Care | Payer: No Typology Code available for payment source | Source: Ambulatory Visit | Attending: Pulmonary Disease | Admitting: Pulmonary Disease

## 2021-04-08 VITALS — Wt 140.2 lb

## 2021-04-08 DIAGNOSIS — J449 Chronic obstructive pulmonary disease, unspecified: Secondary | ICD-10-CM | POA: Diagnosis not present

## 2021-04-08 NOTE — Progress Notes (Signed)
Daily Session Note  Patient Details  Name: Patricia Johnston MRN: [id number] Date of Birth: 12-29-1950 Referring Provider:   April Manson Pulmonary Rehab Walk Test from 03/26/2021 in [place]  Referring Provider Clance  [Ellison coverage]       Encounter Date: 04/08/2021  Check In:  Session Check In - 04/08/21 1513       Check-In   Supervising physician immediately available to respond to emergencies [facility] immediately available    Physician(s) Dr. Doristine Bosworth    [facility]    Staff Present Elmon Else, MS, ACSM-CEP, Exercise Physiologist;Jayne Peckenpaugh Vicente Serene, MS, ACSM CEP, Exercise Physiologist    Virtual Visit No    Medication changes reported     No    Fall or balance concerns reported    No    Tobacco Cessation No Change    Warm-up and Cool-down Performed as group-led instruction    Resistance Training Performed Yes    VAD Patient? No      Pain Assessment   Currently in Pain? No/denies    Multiple Pain Sites No             Capillary Blood Glucose: No results found for this or any previous visit (from the past 24 hour(s)).   Exercise Prescription Changes - 04/08/21 1500       Response to Exercise   Blood Pressure (Admit) 104/62    Blood Pressure (Exercise) 136/82    Blood Pressure (Exit) 98/50    Heart Rate (Admit) 86 bpm    Heart Rate (Exercise) 111 bpm    Heart Rate (Exit) 87 bpm    Oxygen Saturation (Admit) 94 %    Oxygen Saturation (Exercise) 93 %    Oxygen Saturation (Exit) 94 %    Rating of Perceived Exertion (Exercise) 11    Perceived Dyspnea (Exercise) 1    Duration Continue with 30 min of aerobic exercise without signs/symptoms of physical distress.    Intensity Other (comment)   40-80% of HRR     Progression   Progression Continue to progress workloads to maintain intensity without signs/symptoms of physical distress.      Resistance Training   Training  Prescription Yes    Weight Red bands    Reps 10-15    Time 15 Minutes      Treadmill   MPH 2.5    Grade 1    Minutes 15      Recumbant Bike   Level 2    Minutes 15    METs 2.9             Social History   Tobacco Use  Smoking Status Never  Smokeless Tobacco Never    Goals Met:  Exercise tolerated well No report of concerns or symptoms today Strength training completed today  Goals Unmet:  Not Applicable  Comments: Service time is from 1320 to 1439    Dr. Rodman Pickle is Medical Director for Pulmonary Rehab at [facility].

## 2021-04-09 ENCOUNTER — Ambulatory Visit (INDEPENDENT_AMBULATORY_CARE_PROVIDER_SITE_OTHER): Payer: Medicare Other

## 2021-04-09 VITALS — BP 148/74 | HR 76 | Temp 98.5°F | Resp 21 | Ht 66.0 in | Wt 140.0 lb

## 2021-04-09 DIAGNOSIS — R768 Other specified abnormal immunological findings in serum: Secondary | ICD-10-CM | POA: Diagnosis not present

## 2021-04-09 MED ORDER — MEPOLIZUMAB 100 MG ~~LOC~~ SOLR
100.0000 mg | Freq: Once | SUBCUTANEOUS | Status: AC
  Administered 2021-04-09: 100 mg via SUBCUTANEOUS
  Filled 2021-04-09: qty 1

## 2021-04-09 MED ORDER — EPINEPHRINE 0.3 MG/0.3ML IJ SOAJ: 0.3000 mg | Freq: Once | INTRAMUSCULAR | Status: DC | PRN

## 2021-04-09 MED ORDER — FAMOTIDINE IN NACL 20-0.9 MG/50ML-% IV SOLN: 20.0000 mg | Freq: Once | INTRAVENOUS | Status: DC | PRN

## 2021-04-09 MED ORDER — METHYLPREDNISOLONE SODIUM SUCC 125 MG IJ SOLR: 125.0000 mg | Freq: Once | INTRAMUSCULAR | Status: DC | PRN

## 2021-04-09 MED ORDER — ALBUTEROL SULFATE HFA 108 (90 BASE) MCG/ACT IN AERS: 2.0000 | INHALATION_SPRAY | Freq: Once | RESPIRATORY_TRACT | Status: DC | PRN

## 2021-04-09 MED ORDER — SODIUM CHLORIDE 0.9 % IV SOLN: Freq: Once | INTRAVENOUS | Status: DC | PRN

## 2021-04-09 MED ORDER — DIPHENHYDRAMINE HCL 50 MG/ML IJ SOLN: 50.0000 mg | Freq: Once | INTRAMUSCULAR | Status: DC | PRN

## 2021-04-09 NOTE — Progress Notes (Signed)
Diagnosis: Asthma  Provider:  Marshell Garfinkel, MD  Procedure: Injection  Nucala (Mepolizumab), Dose: 100 mg, Site: subcutaneous, abdomen Number of injections: 1  Discharge: Condition: Good, Destination: Home . AVS provided to patient.   Performed by:  Koren Shiver, RN

## 2021-04-10 ENCOUNTER — Other Ambulatory Visit: Payer: Self-pay

## 2021-04-10 ENCOUNTER — Encounter (HOSPITAL_COMMUNITY)
Admission: RE | Admit: 2021-04-10 | Discharge: 2021-04-10 | Disposition: A | Discharge status: Home / Self Care | Payer: No Typology Code available for payment source | Source: Ambulatory Visit | Attending: Pulmonary Disease | Admitting: Pulmonary Disease

## 2021-04-10 DIAGNOSIS — J449 Chronic obstructive pulmonary disease, unspecified: Secondary | ICD-10-CM

## 2021-04-10 NOTE — Progress Notes (Signed)
Daily Session Note  Patient Details  Name: Patricia Johnston MRN: [id number] Date of Birth: 1951/01/02 Referring Provider:   April Manson Pulmonary Rehab Walk Test from 03/26/2021 in [place]  Referring Provider Clance  [Ellison coverage]       Encounter Date: 04/10/2021  Check In:  Session Check In - 04/10/21 1435       Check-In   Supervising physician immediately available to respond to emergencies [facility] immediately available    Physician(s) Dr. Eliseo Squires    [facility]    Staff Present Elmon Else, MS, ACSM-CEP, Exercise Physiologist;Jetta Gilford Rile BS, ACSM EP-C, Exercise Physiologist;Allee Busk Ysidro Evert, RN    Virtual Visit No    Medication changes reported     No    Fall or balance concerns reported    No    Tobacco Cessation No Change    Warm-up and Cool-down Performed as group-led instruction    Resistance Training Performed Yes    VAD Patient? No    PAD/SET Patient? No      Pain Assessment   Currently in Pain? No/denies    Multiple Pain Sites No             Capillary Blood Glucose: No results found for this or any previous visit (from the past 24 hour(s)).    Social History   Tobacco Use  Smoking Status Never  Smokeless Tobacco Never    Goals Met:  Exercise tolerated well No report of concerns or symptoms today Strength training completed today  Goals Unmet:  Not Applicable  Comments: Service time is from 1318 to [place]      Dr. Rodman Pickle is Medical Director for Pulmonary Rehab at [facility].

## 2021-04-15 ENCOUNTER — Other Ambulatory Visit: Payer: Self-pay

## 2021-04-15 ENCOUNTER — Encounter (HOSPITAL_COMMUNITY)
Admission: RE | Admit: 2021-04-15 | Discharge: 2021-04-15 | Disposition: A | Discharge status: Home / Self Care | Payer: No Typology Code available for payment source | Source: Ambulatory Visit | Attending: Pulmonary Disease | Admitting: Pulmonary Disease

## 2021-04-15 DIAGNOSIS — J449 Chronic obstructive pulmonary disease, unspecified: Secondary | ICD-10-CM

## 2021-04-15 NOTE — Progress Notes (Signed)
Daily Session Note  Patient Details  Name: Patricia Johnston MRN: [id number] Date of Birth: 1950-08-07 Referring Provider:   April Manson Pulmonary Rehab Walk Test from 03/26/2021 in [place]  Referring Provider Clance  [Ellison coverage]       Encounter Date: 04/15/2021  Check In:  Session Check In - 04/15/21 1551       Check-In   Supervising physician immediately available to respond to emergencies [facility] immediately available    Physician(s) Dr. Eliseo Squires    [facility]    Staff Present Elmon Else, MS, ACSM-CEP, Exercise Physiologist;David Lilyan Punt, MS, ACSM-CEP, CCRP, Exercise Physiologist;Saleen Peden Ysidro Evert, RN    Virtual Visit No    Medication changes reported     No    Fall or balance concerns reported    No    Tobacco Cessation No Change    Warm-up and Cool-down Performed as group-led instruction    Resistance Training Performed Yes    VAD Patient? No    PAD/SET Patient? No      Pain Assessment   Currently in Pain? No/denies    Multiple Pain Sites No             Capillary Blood Glucose: No results found for this or any previous visit (from the past 24 hour(s)).    Social History   Tobacco Use  Smoking Status Never  Smokeless Tobacco Never    Goals Met:  Exercise tolerated well No report of concerns or symptoms today Strength training completed today  Goals Unmet:  Not Applicable  Comments: Service time is from 1325 to [place]    Dr. Rodman Pickle is Medical Director for Pulmonary Rehab at [facility].

## 2021-04-17 ENCOUNTER — Other Ambulatory Visit: Payer: Self-pay

## 2021-04-17 ENCOUNTER — Encounter (HOSPITAL_COMMUNITY)
Admission: RE | Admit: 2021-04-17 | Discharge: 2021-04-17 | Disposition: A | Discharge status: Home / Self Care | Payer: No Typology Code available for payment source | Source: Ambulatory Visit | Attending: Pulmonary Disease | Admitting: Pulmonary Disease

## 2021-04-17 DIAGNOSIS — J449 Chronic obstructive pulmonary disease, unspecified: Secondary | ICD-10-CM

## 2021-04-17 NOTE — Progress Notes (Signed)
Daily Session Note  Patient Details  Name: Patricia Johnston MRN: [id number] Date of Birth: 1950-08-17 Referring Provider:   April Johnston Pulmonary Rehab Walk Test from 03/26/2021 in [place]  Referring Provider Patricia Johnston  [Ellison coverage]       Encounter Date: 04/17/2021  Check In:  Session Check In - 04/17/21 1451       Check-In   Supervising physician immediately available to respond to emergencies Patricia Johnston immediately available    Physician(s) Patricia Johnston    [facility]    Staff Present Patricia Poles, RN, BSN;Patricia Dredge, RN, Patricia Bras, MS, ACSM-CEP, Exercise Physiologist    Virtual Visit No    Medication changes reported     No    Fall or balance concerns reported    No    Tobacco Cessation No Change    Warm-up and Cool-down Performed as group-led instruction    Resistance Training Performed Yes    VAD Patient? No    PAD/SET Patient? No      Pain Assessment   Currently in Pain? No/denies    Multiple Pain Sites No             Capillary Blood Glucose: No results found for this or any previous visit (from the past 24 hour(s)).    Social History   Tobacco Use  Smoking Status Never  Smokeless Tobacco Never    Goals Met:  Patricia Johnston was having back pain and was not able to complete the strength training today.  Goals Unmet:  Not Applicable  Comments: Service time is from 1313 to 1400    Patricia Johnston is Medical Director for Pulmonary Rehab at [facility].

## 2021-04-21 NOTE — Progress Notes (Signed)
Synopsis: Referred for COPD by Joseph Art, MD  Subjective:   PATIENT ID: Patricia Johnston GENDER: female DOB: 1950/04/02, MRN: [id number]  Chief Complaint  Patient presents with   Follow-up    Increased SOB past few months- notices when she first wakes up and also when she lies down- has to sleep propped up.    [name] with history of COPD previously followed by Dr. Dillard Essex and Dr. Gwenette Greet. Also noted to have elevated IgE and eosinophilia. Had been under consideration for nucala.  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 It has helped her functional independence, dyspnea. She never got prolonged course of steroids or itraconazole by her report. On stiolto, asmanex with spacer, albuterol prn. She hasn't had bothersome cough in a while.   She had an adverse reaction to fasenra (tried it one month). Irritable, anxious, insomnia. Went back to nucala 08/2020. She thinks she now sees Dr. Garald Balding at allergy at [facility].   Goes for walks, gardens. Works in her ministry everyday as Restaurant manager, fast food.   Interval HPI: She says she's having a hard time breathing especially at night. Dyspnea is more frequent. Not coughing anything up. Some chest tightness. Never has had a blood clot  She has been on nucala for almost 2 years.   Otherwise pertinent review of systems is negative.  Past Medical History:  Diagnosis Date   Anxiety    Asthma    COPD (chronic obstructive pulmonary disease) ([place])    pt states due to [employer] service contracted COPD   Difficult intubation    Pt does not remember details over 30 years ago   Eczema    Esophageal thickening 10/13/2018   on [facility]   GERD (gastroesophageal reflux disease)    Osteoporosis    PONV (postoperative nausea and vomiting)    Pulmonary emphysema (HCC)    Pulmonary nodules    Traumatic arthritis    Umbilical hernia    Wears glasses      Family History  Problem Relation Age of  Onset   Heart attack Mother    Colon cancer Neg Hx    Esophageal cancer Neg Hx      Past Surgical History:  Procedure Laterality Date   ABDOMINAL HYSTERECTOMY     bunionectomy     COLONOSCOPY     due 2022 or 2023 per patient   CYST EXCISION Right 07/05/2019   Procedure: Excision of right ear canal cyst;  Surgeon: Helayne Seminole, MD;  Location: [facility] OR;  Service: ENT;  Laterality: Right;   dental implant      Social History   Socioeconomic History   Marital status: Single    Spouse name: Not on file   Number of children: Not on file   Years of education: 16   Highest education level: Bachelor's degree (e.g., BA, AB, BS)  Occupational History   Occupation: retired   Occupation: Designer, 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  Tobacco Use   Smoking status: Never   Smokeless tobacco: Never  Scientific laboratory technician Use: Never used  Substance and Sexual Activity   Alcohol use: Never    Comment: ocassionally   Drug use: No   Sexual activity: Not Currently  Other Topics Concern   Not on file  Social History Narrative   Not on file   Social Determinants of Health   Financial Resource Strain: Not on file  Food Insecurity: Not  on file  Transportation Needs: Not on file  Physical Activity: Not on file  Stress: Not on file  Social Connections: Not on file  Intimate Partner Violence: Not on file     Allergies  Allergen Reactions   Flonase [Fluticasone Propionate] Other (See Comments)    Nasal bleeding   Nasacort [Triamcinolone] Other (See Comments)    Nasal bleeding     Outpatient Medications Prior to Visit  Medication Sig Dispense Refill   albuterol (PROVENTIL) (2.5 MG/3ML) 0.083% nebulizer solution Take 2.5 mg by nebulization as needed for wheezing or shortness of breath.     albuterol (VENTOLIN HFA) 108 (90 Base) MCG/ACT inhaler Inhale 2 puffs into the lungs every 4 (four) hours as needed for wheezing or shortness of breath. 1 Inhaler 1   alendronate (FOSAMAX) 70 MG  tablet Take 70 mg by mouth every Monday. Take with a full glass of water on an empty stomach.     Calcium Carb-Cholecalciferol (CALCIUM 500+D PO) Take 1 tablet by mouth daily.     diphenhydrAMINE (BENADRYL) 25 MG tablet Take 25 mg by mouth every 6 (six) hours as needed (anxiety).     feeding supplement, ENSURE ENLIVE, (ENSURE ENLIVE) LIQD Take 237 mLs by mouth 2 (two) times daily between meals. 237 mL 12   guaiFENesin (MUCINEX) 600 MG 12 hr tablet Take 1 tablet (600 mg total) by mouth 2 (two) times daily. 30 tablet 0   mepolizumab (NUCALA) 100 MG/ML SOSY INJECT 100MG  (1 PREFILL SYRINGE) SUBCUTANEOUSLY EVERY 28 DAYS (APPROVED) FOR CLINIC INFUSION IN [place] ONLY     mometasone (ASMANEX) 220 MCG/INH inhaler Inhale 1 puff into the lungs 2 (two) times daily. 1 each 11   Multiple Vitamins-Minerals (MULTIVITAMIN WITH MINERALS) tablet Take 2 tablets by mouth daily.     Tiotropium Bromide-Olodaterol 2.5-2.5 MCG/ACT AERS Inhale 2 puffs into the lungs daily. 1 each 11   No facility-administered medications prior to visit.       Objective:   Physical Exam:  General appearance: 71 y.o., female, NAD, conversant  Eyes: anicteric sclerae, moist conjunctivae; no lid-lag; PERRL, tracking appropriately HENT: NCAT; oropharynx, MMM, no mucosal ulcerations; normal hard and soft palate Neck: Trachea midline; no lymphadenopathy, no JVD Lungs: CTAB, no crackles, no wheeze, with normal respiratory effort CV: RRR, no MRGs  Abdomen: Soft, non-tender; non-distended, BS present  Extremities: No peripheral edema, radial and DP pulses present bilaterally  Skin: Normal temperature, turgor and texture; no rash Psych: Appropriate affect Neuro: Alert and oriented to person and place, no focal deficit    Vitals:   04/23/21 1143  BP: 112/66  Pulse: 74  Temp: 98.1 F (36.7 C)  TempSrc: Oral  SpO2: 93%  Weight: 138 lb (62.6 kg)  Height: 5\' 5"  (1.651 m)    93% on RA BMI Readings from Last 3 Encounters:   04/23/21 22.96 kg/m  04/22/21 22.31 kg/m  04/09/21 22.60 kg/m   Wt Readings from Last 3 Encounters:  04/23/21 138 lb (62.6 kg)  04/22/21 138 lb 3.7 oz (62.7 kg)  04/09/21 140 lb (63.5 kg)     CBC    Component Value Date/Time   WBC 3.8 (L) 07/05/2019 0842   RBC 4.25 07/05/2019 0842   HGB 13.4 07/05/2019 0842   HCT 40.2 07/05/2019 0842   PLT 205 07/05/2019 0842   MCV 94.6 07/05/2019 0842   MCH 31.5 07/05/2019 0842   MCHC 33.3 07/05/2019 0842   RDW 12.6 07/05/2019 0842   LYMPHSABS 2.9 05/26/2016 0557  MONOABS 0.4 05/26/2016 0557   EOSABS 0.3 05/26/2016 0557   BASOSABS 0.0 05/26/2016 0557    [facility] absolute eosinophil count of 790, IgE 2691, IgM 29 mg/DL, normal IgG and IgA.   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  FEV1 47% pred 06/2019  PFTs 2017 with ratio 38, FEV1 48% pred with + BD response, hyperinflation, very severe air trapping  Echocardiogram:   None     Assessment & Plan:   # Dyspnea Unclear etiology. Doesn't really sound like she's in exacerbation.  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 Has not needed steroid burst since last visit with pulmonary. Never did get antifungal or prolonged steroid course prior to initiation of nucala.    Plan: - IgE, cbc/diff, BMP, BNP - full PFT next available - ONO - continue nucala for now - refilled stiolto, asmanex (using with spacer) - screen for OSA next visit     Maryjane Hurter, MD [place] [facility] 04/23/2021 11:48 AM

## 2021-04-22 ENCOUNTER — Other Ambulatory Visit: Payer: Self-pay

## 2021-04-22 ENCOUNTER — Encounter (HOSPITAL_COMMUNITY)
Admission: RE | Admit: 2021-04-22 | Discharge: 2021-04-22 | Disposition: A | Discharge status: Home / Self Care | Payer: No Typology Code available for payment source | Source: Ambulatory Visit | Attending: Pulmonary Disease | Admitting: Pulmonary Disease

## 2021-04-22 ENCOUNTER — Telehealth: Payer: Self-pay | Admitting: Student

## 2021-04-22 VITALS — Wt 138.2 lb

## 2021-04-22 DIAGNOSIS — J449 Chronic obstructive pulmonary disease, unspecified: Secondary | ICD-10-CM | POA: Diagnosis not present

## 2021-04-22 NOTE — Telephone Encounter (Signed)
ATC patient to see if she is able to come into the office today to get her labs done that Dr. Verlee Monte ordered at last [place]. Left detailed message. She also needed a spiro done and I dont see that that was done either.

## 2021-04-22 NOTE — Progress Notes (Signed)
Daily Session Note  Patient Details  Name: Patricia Johnston MRN: [id number] Date of Birth: Jul 08, 1950 Referring Provider:   April Manson Pulmonary Rehab Walk Test from 03/26/2021 in [place]  Referring Provider Clance  [Ellison coverage]       Encounter Date: 04/22/2021  Check In:  Session Check In - 04/22/21 1507       Check-In   Supervising physician immediately available to respond to emergencies [facility] immediately available    Physician(s) Dr. Doristine Bosworth    [facility]    Staff Present Rosebud Poles, RN, Quentin Ore, MS, ACSM-CEP, Exercise Physiologist;Ama Mcmaster Ysidro Evert, RN    Virtual Visit No    Medication changes reported     No    Fall or balance concerns reported    No    Tobacco Cessation No Change    Warm-up and Cool-down Performed as group-led instruction    Resistance Training Performed Yes    VAD Patient? No    PAD/SET Patient? No      Pain Assessment   Currently in Pain? No/denies    Multiple Pain Sites No             Capillary Blood Glucose: No results found for this or any previous visit (from the past 24 hour(s)).   Exercise Prescription Changes - 04/22/21 1500       Response to Exercise   Blood Pressure (Admit) 102/58    Blood Pressure (Exercise) 120/60    Blood Pressure (Exit) 98/60    Heart Rate (Admit) 91 bpm    Heart Rate (Exercise) 102 bpm    Heart Rate (Exit) 89 bpm    Oxygen Saturation (Admit) 94 %    Oxygen Saturation (Exercise) 92 %    Oxygen Saturation (Exit) 93 %    Rating of Perceived Exertion (Exercise) 13    Perceived Dyspnea (Exercise) 2    Duration Progress to 30 minutes of  aerobic without signs/symptoms of physical distress    Intensity THRR unchanged      Progression   Progression Continue to progress workloads to maintain intensity without signs/symptoms of physical distress.      Resistance Training   Training Prescription Yes    Weight Red bands     Reps 10-15    Time 15 Minutes      Treadmill   MPH 2.3    Grade 1    Minutes 15      Recumbant Bike   Level 2    Minutes 6    METs 1.7             Social History   Tobacco Use  Smoking Status Never  Smokeless Tobacco Never    Goals Met:  Strength training completed today  Goals Unmet:  Not Applicable  Comments: Service time is from 1320 to 1445 Pt c/o of SOB from walking a mile before before she came in to exercise today. She stopped after 6 minutes and had her to rest. Her oxygen saturation on room air was 93%. Explained to pt that she should not exercise on the days that she is coming here. Pt voices understanding.   Dr. Rodman Pickle is Medical Director for Pulmonary Rehab at [facility].

## 2021-04-23 ENCOUNTER — Encounter: Payer: Self-pay | Admitting: Student

## 2021-04-23 ENCOUNTER — Ambulatory Visit (INDEPENDENT_AMBULATORY_CARE_PROVIDER_SITE_OTHER): Payer: Medicare Other

## 2021-04-23 ENCOUNTER — Ambulatory Visit (INDEPENDENT_AMBULATORY_CARE_PROVIDER_SITE_OTHER): Payer: Medicare Other | Admitting: Student

## 2021-04-23 VITALS — BP 112/66 | HR 74 | Temp 98.1°F | Ht 65.0 in | Wt 138.0 lb

## 2021-04-23 DIAGNOSIS — J439 Emphysema, unspecified: Secondary | ICD-10-CM | POA: Diagnosis not present

## 2021-04-23 DIAGNOSIS — R0600 Dyspnea, unspecified: Secondary | ICD-10-CM | POA: Diagnosis not present

## 2021-04-23 DIAGNOSIS — J449 Chronic obstructive pulmonary disease, unspecified: Secondary | ICD-10-CM

## 2021-04-23 DIAGNOSIS — R0609 Other forms of dyspnea: Secondary | ICD-10-CM

## 2021-04-23 DIAGNOSIS — B4481 Allergic bronchopulmonary aspergillosis: Secondary | ICD-10-CM

## 2021-04-23 LAB — BASIC METABOLIC PANEL
BUN: 11 mg/dL (ref 6–23)
CO2: 33 mEq/L — ABNORMAL HIGH (ref 19–32)
Calcium: 9.7 mg/dL (ref 8.4–10.5)
Chloride: 105 mEq/L (ref 96–112)
Creatinine, Ser: 0.88 mg/dL (ref 0.40–1.20)
GFR: 66.34 mL/min (ref >60.00)
Glucose, Bld: 65 mg/dL — ABNORMAL LOW (ref 70–99)
Potassium: 4.2 mEq/L (ref 3.5–5.1)
Sodium: 141 mEq/L (ref 135–145)

## 2021-04-23 LAB — CBC WITH DIFFERENTIAL/PLATELET
Basophils Absolute: 0.1 10*3/uL (ref 0.0–0.1)
Basophils Relative: 1.9 % (ref 0.0–3.0)
Eosinophils Absolute: 0.1 10*3/uL (ref 0.0–0.7)
Eosinophils Relative: 2.6 % (ref 0.0–5.0)
HCT: 40.3 % (ref 36.0–46.0)
Hemoglobin: 13.4 g/dL (ref 12.0–15.0)
Lymphocytes Relative: 54.4 % — ABNORMAL HIGH (ref 12.0–46.0)
Lymphs Abs: 1.6 10*3/uL (ref 0.7–4.0)
MCHC: 33.1 g/dL (ref 30.0–36.0)
MCV: 94.7 fl (ref 78.0–100.0)
Monocytes Absolute: 0.3 10*3/uL (ref 0.1–1.0)
Monocytes Relative: 9.7 % (ref 3.0–12.0)
Neutro Abs: 0.9 10*3/uL — ABNORMAL LOW (ref 1.4–7.7)
Neutrophils Relative %: 31.4 % — ABNORMAL LOW (ref 43.0–77.0)
Platelets: 192 10*3/uL (ref 150.0–400.0)
RBC: 4.26 Mil/uL (ref 3.87–5.11)
RDW: 13.1 % (ref 11.5–15.5)
WBC: 3 10*3/uL — ABNORMAL LOW (ref 4.0–10.5)

## 2021-04-23 LAB — TSH: TSH: 1.69 u[IU]/mL (ref 0.35–5.50)

## 2021-04-23 LAB — BRAIN NATRIURETIC PEPTIDE: Pro B Natriuretic peptide (BNP): 19 pg/mL (ref 0.0–100.0)

## 2021-04-23 NOTE — Patient Instructions (Addendum)
-   X ray and labs today - you will be called to schedule breathing tests, overnight oximetry  - see you in 4 weeks

## 2021-04-24 ENCOUNTER — Telehealth: Payer: Self-pay | Admitting: Student

## 2021-04-24 ENCOUNTER — Encounter (HOSPITAL_COMMUNITY): Payer: No Typology Code available for payment source

## 2021-04-24 ENCOUNTER — Other Ambulatory Visit: Payer: Self-pay | Admitting: Student

## 2021-04-24 DIAGNOSIS — R0781 Pleurodynia: Secondary | ICD-10-CM

## 2021-04-24 DIAGNOSIS — R0609 Other forms of dyspnea: Secondary | ICD-10-CM

## 2021-04-24 DIAGNOSIS — J9621 Acute and chronic respiratory failure with hypoxia: Secondary | ICD-10-CM

## 2021-04-24 LAB — IGE: IgE (Immunoglobulin E), Serum: 5098 kU/L — ABNORMAL HIGH (ref <=114)

## 2021-04-24 NOTE — Telephone Encounter (Signed)
Spoke with the pt  She states she woke up with right side CP- dull and occurs with taking a deep breath She states this is new and never had before  She is concerned about cxr results  Not read yet, but Dr Verlee Monte reviewed images and states no concerns or obvious changes from previous cxr She is not having any increased SOB or other new symptoms since her visit yesterday  I advised will wait on final radiology report on cxr and let her know once this comes in  In the meantime, she should seek emergent care if pain becomes unbearable or has increased SOB

## 2021-04-24 NOTE — Telephone Encounter (Signed)
Called and discussed her pleuritic CP with her. Her CXR looks stable overall. 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 She in the past has had skin test positivity to aspergillus. 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 I'd like to get a CT Chest to look for disease activity as well but I think we should check a d-dimer first in case we should get CTA Chest. In meantime can apply heating pad in case this is MSK strain, tylenol. If severe pain despite this especially if also has worsening dyspnea, presyncope/syncope, and/or hemoptysis then she should head in to ED.

## 2021-04-29 ENCOUNTER — Encounter (HOSPITAL_COMMUNITY)
Admission: RE | Admit: 2021-04-29 | Discharge: 2021-04-29 | Disposition: A | Discharge status: Home / Self Care | Payer: No Typology Code available for payment source | Source: Ambulatory Visit | Attending: Pulmonary Disease | Admitting: Pulmonary Disease

## 2021-04-29 ENCOUNTER — Other Ambulatory Visit: Payer: Self-pay

## 2021-04-29 DIAGNOSIS — J449 Chronic obstructive pulmonary disease, unspecified: Secondary | ICD-10-CM | POA: Diagnosis not present

## 2021-04-29 NOTE — Progress Notes (Signed)
Daily Session Note  Patient Details  Name: Patricia Johnston MRN: [id number] Date of Birth: [birth year] Referring Provider:   April Manson Pulmonary Rehab Walk Test from 03/26/2021 in [place]  Referring Provider Clance  [[place] coverage]       Encounter Date: 04/29/2021  Check In:  Session Check In - 04/29/21 1426       Check-In   Supervising physician immediately available to respond to emergencies [facility] immediately available    Physician(s) Dr. Eliseo Squires    Location [facility]    Staff Present Rosebud Poles, RN, BSN;Carlette Wilber Oliphant, RN, Quentin Ore, MS, ACSM-CEP, Exercise Physiologist;Lisa Ysidro Evert, RN    Virtual Visit No    Medication changes reported     No    Fall or balance concerns reported    No    Tobacco Cessation No Change    Warm-up and Cool-down Performed as group-led instruction    Resistance Training Performed Yes    VAD Patient? No    PAD/SET Patient? No      Pain Assessment   Currently in Pain? No/denies    Multiple Pain Sites No             Capillary Blood Glucose: No results found for this or any previous visit (from the past 24 hour(s)).    Social History   Tobacco Use  Smoking Status Never  Smokeless Tobacco Never    Goals Met:  Proper associated with RPD/PD & O2 Sat Exercise tolerated well No report of concerns or symptoms today Strength training completed today  Goals Unmet:  Not Applicable  Comments: Service time is from 1325 to 1441.    Dr. Rodman Pickle is Medical Director for Pulmonary Rehab at [facility].

## 2021-04-30 NOTE — Progress Notes (Signed)
Pulmonary Individual Treatment Plan  Patient Details  Name: Patricia Johnston MRN: [id number] Date of Birth: [birth year] Referring Provider:   April Manson Pulmonary Rehab Walk Test from 03/26/2021 in [place]  Referring Provider Clance  [[place] coverage]       Initial Encounter Date:  Flowsheet Row Pulmonary Rehab Walk Test from 03/26/2021 in [place]  Date 03/26/21       Visit Diagnosis: Chronic obstructive pulmonary disease, unspecified COPD type ([place])  Patient's Home Medications on Admission:   Current Outpatient Medications:    albuterol (PROVENTIL) (2.5 MG/3ML) 0.083% nebulizer solution, Take 2.5 mg by nebulization as needed for wheezing or shortness of breath., Disp: , Rfl:    albuterol (VENTOLIN HFA) 108 (90 Base) MCG/ACT inhaler, Inhale 2 puffs into the lungs every 4 (four) hours as needed for wheezing or shortness of breath., Disp: 1 Inhaler, Rfl: 1   alendronate (FOSAMAX) 70 MG tablet, Take 70 mg by mouth every Monday. Take with a full glass of water on an empty stomach., Disp: , Rfl:    Calcium Carb-Cholecalciferol (CALCIUM 500+D PO), Take 1 tablet by mouth daily., Disp: , Rfl:    diphenhydrAMINE (BENADRYL) 25 MG tablet, Take 25 mg by mouth every 6 (six) hours as needed (anxiety)., Disp: , Rfl:    feeding supplement, ENSURE ENLIVE, (ENSURE ENLIVE) LIQD, Take 237 mLs by mouth 2 (two) times daily between meals., Disp: 237 mL, Rfl: 12   guaiFENesin (MUCINEX) 600 MG 12 hr tablet, Take 1 tablet (600 mg total) by mouth 2 (two) times daily., Disp: 30 tablet, Rfl: 0   mepolizumab (NUCALA) 100 MG/ML SOSY, INJECT 100MG (1 PREFILL SYRINGE) SUBCUTANEOUSLY EVERY 28 DAYS (APPROVED) FOR CLINIC INFUSION IN [place] ONLY, Disp: , Rfl:    mometasone (ASMANEX) 220 MCG/INH inhaler, Inhale 1 puff into the lungs 2 (two) times daily., Disp: 1 each, Rfl: 11   Multiple Vitamins-Minerals (MULTIVITAMIN WITH MINERALS) tablet, Take  2 tablets by mouth daily., Disp: , Rfl:    Tiotropium Bromide-Olodaterol 2.5-2.5 MCG/ACT AERS, Inhale 2 puffs into the lungs daily., Disp: 1 each, Rfl: 11  Past Medical History: Past Medical History:  Diagnosis Date   Anxiety    Asthma    COPD (chronic obstructive pulmonary disease) ([place])    pt states due to [employer] service contracted COPD   Difficult intubation    Pt does not remember details over 30 years ago   Eczema    Esophageal thickening 10/13/2018   on CT- [facility]   GERD (gastroesophageal reflux disease)    Osteoporosis    PONV (postoperative nausea and vomiting)    Pulmonary emphysema (HCC)    Pulmonary nodules    Traumatic arthritis    Umbilical hernia    Wears glasses     Tobacco Use: Social History   Tobacco Use  Smoking Status Never  Smokeless Tobacco Never    Labs: Recent Review Flowsheet Data   There is no flowsheet data to display.     Capillary Blood Glucose: Lab Results  Component Value Date   GLUCAP 99 06/16/2017     Pulmonary Assessment Scores:  Pulmonary Assessment Scores     Row Name 03/26/21 1031         ADL UCSD   ADL Phase Entry     SOB Score total 60       CAT Score   CAT Score 29  UCSD: Self-administered rating of dyspnea associated with activities of daily living (ADLs) 6-point scale (0 = "not at all" to 5 = "maximal or unable to do because of breathlessness")  Scoring Scores range from 0 to 120.  Minimally important difference is 5 units  CAT: CAT can identify the health impairment of COPD patients and is better correlated with disease progression.  CAT has a scoring range of zero to 40. The CAT score is classified into four groups of low (less than 10), medium (10 - 20), high (21-30) and very high (31-40) based on the impact level of disease on health status. A CAT score over 10 suggests significant symptoms.  A worsening CAT score could be explained by an exacerbation, poor  medication adherence, poor inhaler technique, or progression of COPD or comorbid conditions.  CAT MCID is 2 points  mMRC: mMRC (Modified Medical Research Council) Dyspnea Scale is used to assess the degree of baseline functional disability in patients of respiratory disease due to dyspnea. No minimal important difference is established. A decrease in score of 1 point or greater is considered a positive change.   Pulmonary Function Assessment:  Pulmonary Function Assessment - 03/26/21 1039       Breath   Bilateral Breath Sounds Decreased   Decreased in the bases   Shortness of Breath Yes;Limiting activity   She feels that she knows how to handle her SOB with PLB,Inhaler and 911 if neessary.SOB done not fear or panic her.            Exercise Target Goals: Exercise Program Goal: Individual exercise prescription set using results from initial 6 min walk test and THRR while considering  patients activity barriers and safety.   Exercise Prescription Goal: Initial exercise prescription builds to 30-45 minutes a day of aerobic activity, 2-3 days per week.  Home exercise guidelines will be given to patient during program as part of exercise prescription that the participant will acknowledge.  Activity Barriers & Risk Stratification:  Activity Barriers & Cardiac Risk Stratification - 03/26/21 1011       Activity Barriers & Cardiac Risk Stratification   Activity Barriers Deconditioning;Muscular Weakness;Shortness of Breath    Cardiac Risk Stratification Low             6 Minute Walk:  6 Minute Walk     Row Name 03/26/21 1104         6 Minute Walk   Phase Initial     Distance 1710 feet     Walk Time 6 minutes     # of Rest Breaks 0     MPH 3.24     METS 3.68     RPE 11     Perceived Dyspnea  1     VO2 Peak 12.89     Symptoms No     Resting HR 82 bpm     Resting BP 112/60     Resting Oxygen Saturation  95 %     Exercise Oxygen Saturation  during 6 min walk 89 %      Max Ex. HR 97 bpm     Max Ex. BP 120/60     2 Minute Post BP 118/60       Interval HR   1 Minute HR 90     2 Minute HR 92     3 Minute HR 92     4 Minute HR 95     5 Minute HR 97     6  Minute HR 90     2 Minute Post HR 76     Interval Heart Rate? Yes       Interval Oxygen   Interval Oxygen? Yes     Baseline Oxygen Saturation % 95 %     1 Minute Oxygen Saturation % 91 %     1 Minute Liters of Oxygen 0 L     2 Minute Oxygen Saturation % 90 %     2 Minute Liters of Oxygen 0 L     3 Minute Oxygen Saturation % 90 %     3 Minute Liters of Oxygen 0 L     4 Minute Oxygen Saturation % 89 %     4 Minute Liters of Oxygen 0 L     5 Minute Oxygen Saturation % 90 %     5 Minute Liters of Oxygen 0 L     6 Minute Oxygen Saturation % 91 %     6 Minute Liters of Oxygen 0 L     2 Minute Post Oxygen Saturation % 95 %     2 Minute Post Liters of Oxygen 0 L              Oxygen Initial Assessment:  Oxygen Initial Assessment - 03/26/21 1014       Home Oxygen   Home Oxygen Device None    Sleep Oxygen Prescription None    Home Exercise Oxygen Prescription None    Home Resting Oxygen Prescription None      Initial 6 min Walk   Oxygen Used None      Program Oxygen Prescription   Program Oxygen Prescription None      Intervention   Short Term Goals To learn and understand importance of monitoring SPO2 with pulse oximeter and demonstrate accurate use of the pulse oximeter.;To learn and understand importance of maintaining oxygen saturations>88%;To learn and demonstrate proper pursed lip breathing techniques or other breathing techniques. ;To learn and demonstrate proper use of respiratory medications    Long  Term Goals Verbalizes importance of monitoring SPO2 with pulse oximeter and return demonstration;Maintenance of O2 saturations>88%;Exhibits proper breathing techniques, such as pursed lip breathing or other method taught during program session;Compliance with respiratory  medication;Demonstrates proper use of MDIs             Oxygen Re-Evaluation:  Oxygen Re-Evaluation     Row Name 04/21/21 1126             Program Oxygen Prescription   Program Oxygen Prescription None         Home Oxygen   Home Oxygen Device None       Sleep Oxygen Prescription None       Home Exercise Oxygen Prescription None       Home Resting Oxygen Prescription None         Goals/Expected Outcomes   Short Term Goals To learn and understand importance of monitoring SPO2 with pulse oximeter and demonstrate accurate use of the pulse oximeter.;To learn and understand importance of maintaining oxygen saturations>88%;To learn and demonstrate proper pursed lip breathing techniques or other breathing techniques. ;To learn and demonstrate proper use of respiratory medications       Long  Term Goals Verbalizes importance of monitoring SPO2 with pulse oximeter and return demonstration;Maintenance of O2 saturations>88%;Exhibits proper breathing techniques, such as pursed lip breathing or other method taught during program session;Compliance with respiratory medication;Demonstrates proper use of MDIs       Goals/Expected  Outcomes Compliance and understanding of oxygen saturation and breathing techniques to decrease shortness of breath                Oxygen Discharge (Final Oxygen Re-Evaluation):  Oxygen Re-Evaluation - 04/21/21 1126       Program Oxygen Prescription   Program Oxygen Prescription None      Home Oxygen   Home Oxygen Device None    Sleep Oxygen Prescription None    Home Exercise Oxygen Prescription None    Home Resting Oxygen Prescription None      Goals/Expected Outcomes   Short Term Goals To learn and understand importance of monitoring SPO2 with pulse oximeter and demonstrate accurate use of the pulse oximeter.;To learn and understand importance of maintaining oxygen saturations>88%;To learn and demonstrate proper pursed lip breathing techniques or other  breathing techniques. ;To learn and demonstrate proper use of respiratory medications    Long  Term Goals Verbalizes importance of monitoring SPO2 with pulse oximeter and return demonstration;Maintenance of O2 saturations>88%;Exhibits proper breathing techniques, such as pursed lip breathing or other method taught during program session;Compliance with respiratory medication;Demonstrates proper use of MDIs    Goals/Expected Outcomes Compliance and understanding of oxygen saturation and breathing techniques to decrease shortness of breath             Initial Exercise Prescription:  Initial Exercise Prescription - 03/26/21 1100       Date of Initial Exercise RX and Referring Provider   Date 03/26/21    Referring Provider Baltazar Najjar coverage   Expected Discharge Date 05/29/21      Recumbant Bike   Level 2    Minutes 15    METs 3      Track   Minutes 15    METs 3.68      Prescription Details   Frequency (times per week) 2    Duration Progress to 30 minutes of continuous aerobic without signs/symptoms of physical distress      Intensity   THRR 40-80% of Max Heartrate 60-120    Ratings of Perceived Exertion 11-13    Perceived Dyspnea 0-4      Progression   Progression Continue progressive overload as per policy without signs/symptoms or physical distress.      Resistance Training   Training Prescription Yes    Weight red bands    Reps 10-15             Perform Capillary Blood Glucose checks as needed.  Exercise Prescription Changes:   Exercise Prescription Changes     Row Name 04/08/21 1500 04/22/21 1500           Response to Exercise   Blood Pressure (Admit) 104/62 102/58      Blood Pressure (Exercise) 136/82 120/60      Blood Pressure (Exit) 98/50 98/60      Heart Rate (Admit) 86 bpm 91 bpm      Heart Rate (Exercise) 111 bpm 102 bpm      Heart Rate (Exit) 87 bpm 89 bpm      Oxygen Saturation (Admit) 94 % 94 %      Oxygen Saturation (Exercise)  93 % 92 %      Oxygen Saturation (Exit) 94 % 93 %      Rating of Perceived Exertion (Exercise) 11 13      Perceived Dyspnea (Exercise) 1 2      Duration Continue with 30 min of aerobic exercise without signs/symptoms of physical distress. Progress  to 30 minutes of  aerobic without signs/symptoms of physical distress      Intensity Other (comment)  40-80% of HRR THRR unchanged        Progression   Progression Continue to progress workloads to maintain intensity without signs/symptoms of physical distress. Continue to progress workloads to maintain intensity without signs/symptoms of physical distress.        Resistance Training   Training Prescription Yes Yes      Weight Red bands Red bands      Reps 10-15 10-15      Time 15 Minutes 15 Minutes        Treadmill   MPH 2.5 2.3      Grade 1 1      Minutes 15 15        Recumbant Bike   Level 2 2      Minutes 15 6      METs 2.9 1.7               Exercise Comments:   Exercise Goals and Review:   Exercise Goals     Row Name 03/26/21 1058 04/21/21 1120           Exercise Goals   Increase Physical Activity Yes Yes      Intervention Provide advice, education, support and counseling about physical activity/exercise needs.;Develop an individualized exercise prescription for aerobic and resistive training based on initial evaluation findings, risk stratification, comorbidities and participant's personal goals. Provide advice, education, support and counseling about physical activity/exercise needs.;Develop an individualized exercise prescription for aerobic and resistive training based on initial evaluation findings, risk stratification, comorbidities and participant's personal goals.      Expected Outcomes Short Term: Attend rehab on a regular basis to increase amount of physical activity.;Long Term: Add in home exercise to make exercise part of routine and to increase amount of physical activity.;Long Term: Exercising regularly at  least 3-5 days a week. Short Term: Attend rehab on a regular basis to increase amount of physical activity.;Long Term: Add in home exercise to make exercise part of routine and to increase amount of physical activity.;Long Term: Exercising regularly at least 3-5 days a week.      Increase Strength and Stamina Yes Yes      Intervention Provide advice, education, support and counseling about physical activity/exercise needs.;Develop an individualized exercise prescription for aerobic and resistive training based on initial evaluation findings, risk stratification, comorbidities and participant's personal goals. Provide advice, education, support and counseling about physical activity/exercise needs.;Develop an individualized exercise prescription for aerobic and resistive training based on initial evaluation findings, risk stratification, comorbidities and participant's personal goals.      Expected Outcomes Short Term: Increase workloads from initial exercise prescription for resistance, speed, and METs.;Short Term: Perform resistance training exercises routinely during rehab and add in resistance training at home;Long Term: Improve cardiorespiratory fitness, muscular endurance and strength as measured by increased METs and functional capacity (6MWT) Short Term: Increase workloads from initial exercise prescription for resistance, speed, and METs.;Short Term: Perform resistance training exercises routinely during rehab and add in resistance training at home;Long Term: Improve cardiorespiratory fitness, muscular endurance and strength as measured by increased METs and functional capacity (6MWT)      Able to understand and use rate of perceived exertion (RPE) scale Yes Yes      Intervention Provide education and explanation on how to use RPE scale Provide education and explanation on how to use RPE scale  Expected Outcomes Short Term: Able to use RPE daily in rehab to express subjective intensity level;Long  Term:  Able to use RPE to guide intensity level when exercising independently Short Term: Able to use RPE daily in rehab to express subjective intensity level;Long Term:  Able to use RPE to guide intensity level when exercising independently      Able to understand and use Dyspnea scale Yes Yes      Intervention Provide education and explanation on how to use Dyspnea scale Provide education and explanation on how to use Dyspnea scale      Expected Outcomes Short Term: Able to use Dyspnea scale daily in rehab to express subjective sense of shortness of breath during exertion;Long Term: Able to use Dyspnea scale to guide intensity level when exercising independently Short Term: Able to use Dyspnea scale daily in rehab to express subjective sense of shortness of breath during exertion;Long Term: Able to use Dyspnea scale to guide intensity level when exercising independently      Knowledge and understanding of Target Heart Rate Range (THRR) Yes Yes      Intervention Provide education and explanation of THRR including how the numbers were predicted and where they are located for reference Provide education and explanation of THRR including how the numbers were predicted and where they are located for reference      Expected Outcomes Short Term: Able to state/look up THRR;Long Term: Able to use THRR to govern intensity when exercising independently;Short Term: Able to use daily as guideline for intensity in rehab Short Term: Able to state/look up THRR;Long Term: Able to use THRR to govern intensity when exercising independently;Short Term: Able to use daily as guideline for intensity in rehab      Understanding of Exercise Prescription Yes Yes      Intervention Provide education, explanation, and written materials on patient's individual exercise prescription Provide education, explanation, and written materials on patient's individual exercise prescription      Expected Outcomes Short Term: Able to explain  program exercise prescription;Long Term: Able to explain home exercise prescription to exercise independently Short Term: Able to explain program exercise prescription;Long Term: Able to explain home exercise prescription to exercise independently               Exercise Goals Re-Evaluation :  Exercise Goals Re-Evaluation     Row Name 04/21/21 1120             Exercise Goal Re-Evaluation   Exercise Goals Review Increase Physical Activity;Increase Strength and Stamina;Able to understand and use rate of perceived exertion (RPE) scale;Able to understand and use Dyspnea scale;Knowledge and understanding of Target Heart Rate Range (THRR);Understanding of Exercise Prescription       Comments Lilyan has completed 6 exericse sessions. She exercises for 15 min on the treadmill and recumbent bike. She was switched from the track to the treadmill as she tolerates this well. Jazira averages 3.26 METs at 2.83mh @ 1.0% on the treadmill and 2.0 METs at level 2 on the recumbent bike. She performs the warmup and cooldown standing without limitations. MAyvenhad to leave early last session due to her back pain. MFloellaalso get confused when setting up the recumbent bike, which is why she has not increased her workload. Will try to progress her as able next session and monitor.       Expected Outcomes Through exercise at rehab and home, the patient will decrease shortness of breath with daily activities and feel confident in carrying  out an exercise regimen at home.                Discharge Exercise Prescription (Final Exercise Prescription Changes):  Exercise Prescription Changes - 04/22/21 1500       Response to Exercise   Blood Pressure (Admit) 102/58    Blood Pressure (Exercise) 120/60    Blood Pressure (Exit) 98/60    Heart Rate (Admit) 91 bpm    Heart Rate (Exercise) 102 bpm    Heart Rate (Exit) 89 bpm    Oxygen Saturation (Admit) 94 %    Oxygen Saturation (Exercise) 92 %    Oxygen  Saturation (Exit) 93 %    Rating of Perceived Exertion (Exercise) 13    Perceived Dyspnea (Exercise) 2    Duration Progress to 30 minutes of  aerobic without signs/symptoms of physical distress    Intensity THRR unchanged      Progression   Progression Continue to progress workloads to maintain intensity without signs/symptoms of physical distress.      Resistance Training   Training Prescription Yes    Weight Red bands    Reps 10-15    Time 15 Minutes      Treadmill   MPH 2.3    Grade 1    Minutes 15      Recumbant Bike   Level 2    Minutes 6    METs 1.7             Nutrition:  Target Goals: Understanding of nutrition guidelines, daily intake of sodium <1584m, cholesterol <2051m calories 30% from fat and 7% or less from saturated fats, daily to have 5 or more servings of fruits and vegetables.  Biometrics:  Pre Biometrics - 03/26/21 1014       Pre Biometrics   Grip Strength 33 kg   left hand             Nutrition Therapy Plan and Nutrition Goals:   Nutrition Assessments:  MEDIFICTS Score Key: ?70 Need to make dietary changes  40-70 Heart Healthy Diet ? 40 Therapeutic Level Cholesterol Diet   Picture Your Plate Scores: <4<58nhealthy dietary pattern with much room for improvement. 41-50 Dietary pattern unlikely to meet recommendations for good health and room for improvement. 51-60 More healthful dietary pattern, with some room for improvement.  >60 Healthy dietary pattern, although there may be some specific behaviors that could be improved.    Nutrition Goals Re-Evaluation:   Nutrition Goals Discharge (Final Nutrition Goals Re-Evaluation):   Psychosocial: Target Goals: Acknowledge presence or absence of significant depression and/or stress, maximize coping skills, provide positive support system. Participant is able to verbalize types and ability to use techniques and skills needed for reducing stress and depression.  Initial Review &  Psychosocial Screening:  Initial Psych Review & Screening - 03/26/21 1038       Initial Review   Current issues with None Identified      Family Dynamics   Good Support System? Yes   She has no family in the area but her spirital family is a strong support system for her     Barriers   Psychosocial barriers to participate in program There are no identifiable barriers or psychosocial needs.      Screening Interventions   Interventions Encouraged to exercise             Quality of Life Scores:  Scores of 19 and below usually indicate a poorer quality of life in these areas.  A difference of  2-3 points is a clinically meaningful difference.  A difference of 2-3 points in the total score of the Quality of Life Index has been associated with significant improvement in overall quality of life, self-image, physical symptoms, and general health in studies assessing change in quality of life.  PHQ-9: Recent Review Flowsheet Data     Depression screen [facility] 2/9 03/26/2021 09/27/2015   Decreased Interest 0 0   Down, Depressed, Hopeless 0 0   PHQ - 2 Score 0 0   Altered sleeping 0 -   Tired, decreased energy 1 -   Change in appetite 0 -   Feeling bad or failure about yourself  0 -   Trouble concentrating 0 -   Moving slowly or fidgety/restless 0 -   Suicidal thoughts 0 -   PHQ-9 Score 1 -   Difficult doing work/chores Not difficult at all -      Interpretation of Total Score  Total Score Depression Severity:  1-4 = Minimal depression, 5-9 = Mild depression, 10-14 = Moderate depression, 15-19 = Moderately severe depression, 20-27 = Severe depression   Psychosocial Evaluation and Intervention:  Psychosocial Evaluation - 03/26/21 1056       Psychosocial Evaluation & Interventions   Interventions Encouraged to exercise with the program and follow exercise prescription    Comments Hollan has no psychosocial concerns or barriers identified    Expected Outcomes For her to be able  to participate in the PR program with no psychosocial  issues or concerns    Continue Psychosocial Services  Follow up required by staff             Psychosocial Re-Evaluation:  Psychosocial Re-Evaluation     [place] Name 04/02/21 1005 04/30/21 1027           Psychosocial Re-Evaluation   Current issues with None Identified None Identified      Comments Saren is just getting started in the PR program and has attended one exercise session. It is to soon to re evaluate her psychosocial barriers or concerns. Niamh has been able to participate in the PR program with no psychosocial barriers. She is exercising on the TM and the recumbent bike and progressing well.      Expected Outcomes For Markella to be able to particiate in the PR program with no psychosocial barriers or concerns For Geniva to be able to continue to participate in the PR program with no psychosocial concerns or issues.      Interventions Encouraged to attend Pulmonary Rehabilitation for the exercise Encouraged to attend Pulmonary Rehabilitation for the exercise      Continue Psychosocial Services  No Follow up required No Follow up required               Psychosocial Discharge (Final Psychosocial Re-Evaluation):  Psychosocial Re-Evaluation - 04/30/21 1027       Psychosocial Re-Evaluation   Current issues with None Identified    Comments Saaya has been able to participate in the PR program with no psychosocial barriers. She is exercising on the TM and the recumbent bike and progressing well.    Expected Outcomes For Lakira to be able to continue to participate in the PR program with no psychosocial concerns or issues.    Interventions Encouraged to attend Pulmonary Rehabilitation for the exercise    Continue Psychosocial Services  No Follow up required             Education: Education Goals: Education classes  will be provided on a weekly basis, covering required topics. Participant will state  understanding/return demonstration of topics presented.  Learning Barriers/Preferences:  Learning Barriers/Preferences - 03/26/21 1108       Learning Barriers/Preferences   Learning Barriers None    Learning Preferences Written Material;Computer/Internet;Verbal Instruction;Audio             Education Topics: Risk Factor Reduction:  -Group instruction that is supported by a PowerPoint presentation. Instructor discusses the definition of a risk factor, different risk factors for pulmonary disease, and how the heart and lungs work together.     Nutrition for Pulmonary Patient:  -Group instruction provided by PowerPoint slides, verbal discussion, and written materials to support subject matter. The instructor gives an explanation and review of healthy diet recommendations, which includes a discussion on weight management, recommendations for fruit and vegetable consumption, as well as protein, fluid, caffeine, fiber, sodium, sugar, and alcohol. Tips for eating when patients are short of breath are discussed. Flowsheet Row PULMONARY REHAB CHRONIC OBSTRUCTIVE PULMONARY DISEASE from 02/13/2016 in [place]  Date 01/09/16  [facility] Eating During the [place]  Educator RD  Instruction Review Code (Retired) 2- meets goals/outcomes       Pursed Lip Breathing:  -Group instruction that is supported by demonstration and informational handouts. Instructor discusses the benefits of pursed lip and diaphragmatic breathing and detailed demonstration on how to preform both.   Flowsheet Row PULMONARY REHAB CHRONIC OBSTRUCTIVE PULMONARY DISEASE from 02/13/2016 in [place]  Date 12/12/15  Educator EP  Instruction Review Code (Retired) 2- meets goals/outcomes       Oxygen Safety:  -Group instruction provided by PowerPoint, verbal discussion, and written material to support subject matter. There is an overview of What is Oxygen and  Why do we need it.  Instructor also reviews how to create a safe environment for oxygen use, the importance of using oxygen as prescribed, and the risks of noncompliance. There is a brief discussion on traveling with oxygen and resources the patient may utilize. Flowsheet Row PULMONARY REHAB CHRONIC OBSTRUCTIVE PULMONARY DISEASE from 02/13/2016 in [place]  Date 01/02/16  Educator rn  Instruction Review Code (Retired) 2- meets goals/outcomes       Oxygen Equipment:  -Group instruction provided by [employer], Network engineer, and Insurance underwriter. Flowsheet Row PULMONARY REHAB CHRONIC OBSTRUCTIVE PULMONARY DISEASE from 02/13/2016 in [place]  Date 01/16/16  Educator Rep  Instruction Review Code (Retired) 2- meets goals/outcomes       Signs and Symptoms:  -Group instruction provided by written material and verbal discussion to support subject matter. Warning signs and symptoms of infection, stroke, and heart attack are reviewed and when to call the physician/911 reinforced. Tips for preventing the spread of infection discussed. Flowsheet Row PULMONARY REHAB CHRONIC OBSTRUCTIVE PULMONARY DISEASE from 02/13/2016 in [place]  Date 02/06/16  Educator rn  Instruction Review Code (Retired) R- Review/reinforce       Advanced Directives:  -Group instruction provided by verbal instruction and written material to support subject matter. Instructor reviews Advanced Directive laws and proper instruction for filling out document.   Pulmonary Video:  -Group video education that reviews the importance of medication and oxygen compliance, exercise, good nutrition, pulmonary hygiene, and pursed lip and diaphragmatic breathing for the pulmonary patient. Flowsheet Row PULMONARY REHAB CHRONIC OBSTRUCTIVE PULMONARY DISEASE from 02/13/2016 in [place]  [place]  Date 12/19/15  Educator video  Instruction Review Code (Retired) 2- meets goals/outcomes       Exercise for the Pulmonary Patient:  -Group instruction that is supported by a PowerPoint presentation. Instructor discusses benefits of exercise, core components of exercise, frequency, duration, and intensity of an exercise routine, importance of utilizing pulse oximetry during exercise, safety while exercising, and options of places to exercise outside of rehab.   Flowsheet Row PULMONARY REHAB CHRONIC OBSTRUCTIVE PULMONARY DISEASE from 04/17/2021 in [place]  Date 04/03/21  Educator --  [Handout]       Pulmonary Medications:  -Verbally interactive group education provided by instructor with focus on inhaled medications and proper administration. Flowsheet Row PULMONARY REHAB CHRONIC OBSTRUCTIVE PULMONARY DISEASE from 04/17/2021 in [place]  Date 04/10/21  Educator Donnetta Simpers  [Handout]       Anatomy and Physiology of the Respiratory System and Intimacy:  -Group instruction provided by PowerPoint, verbal discussion, and written material to support subject matter. Instructor reviews respiratory cycle and anatomical components of the respiratory system and their functions. Instructor also reviews differences in obstructive and restrictive respiratory diseases with examples of each. Intimacy, Sex, and Sexuality differences are reviewed with a discussion on how relationships can change when diagnosed with pulmonary disease. Common sexual concerns are reviewed. Flowsheet Row PULMONARY REHAB CHRONIC OBSTRUCTIVE PULMONARY DISEASE from 02/13/2016 in [place]  Date 02/13/16  Educator rn  Instruction Review Code (Retired) 2- meets goals/outcomes       MD DAY -A group question and answer session with a medical doctor that allows participants to ask questions that relate to  their pulmonary disease state.   OTHER EDUCATION -Group or individual verbal, written, or video instructions that support the educational goals of the pulmonary rehab program. [place] from 04/17/2021 in [place]  Date 04/17/21  Educator --  Anselm Jungling Plate]       Holiday Eating Survival Tips:  -Group instruction provided by PowerPoint slides, verbal discussion, and written materials to support subject matter. The instructor gives patients tips, tricks, and techniques to help them not only survive but enjoy the holidays despite the onslaught of food that accompanies the holidays.   Knowledge Questionnaire Score:  Knowledge Questionnaire Score - 03/26/21 1031       Knowledge Questionnaire Score   Pre Score 17/18             Core Components/Risk Factors/Patient Goals at Admission:  Personal Goals and Risk Factors at Admission - 03/26/21 1100       Core Components/Risk Factors/Patient Goals on Admission   Improve shortness of breath with ADL's Yes    Intervention Provide education, individualized exercise plan and daily activity instruction to help decrease symptoms of SOB with activities of daily living.    Expected Outcomes Short Term: Improve cardiorespiratory fitness to achieve a reduction of symptoms when performing ADLs;Long Term: Be able to perform more ADLs without symptoms or delay the onset of symptoms    Increase knowledge of respiratory medications and ability to use respiratory devices properly  Yes    Intervention Provide education and demonstration as needed of appropriate use of medications, inhalers, and oxygen therapy.    Expected Outcomes Short Term: Achieves understanding of medications use. Understands that oxygen is a medication prescribed by physician. Demonstrates appropriate use of inhaler and oxygen therapy.;Long Term: Maintain appropriate use  of medications, inhalers,  and oxygen therapy.             Core Components/Risk Factors/Patient Goals Review:   Goals and Risk Factor Review     Row Name 04/02/21 1009 04/30/21 1033           Core Components/Risk Factors/Patient Goals Review   Personal Goals Review Improve shortness of breath with ADL's;Increase knowledge of respiratory medications and ability to use respiratory devices properly.;Develop more efficient breathing techniques such as purse lipped breathing and diaphragmatic breathing and practicing self-pacing with activity. Improve shortness of breath with ADL's;Develop more efficient breathing techniques such as purse lipped breathing and diaphragmatic breathing and practicing self-pacing with activity.;Increase knowledge of respiratory medications and ability to use respiratory devices properly.      Review Elliona has only had one exercise session but she is highly motivated to participate in the PR program. She is performing well and at a high level even on her first session.[name] is progressing well in the PR program on the TM and recumbent bike. She is on a speed of 2.5 and grade of 2 on the TM and level 2 on the recumbent bike. She states that the exercise is fairly light to somewhat hard and reports her SOB to be fairly light to fairly light with some difficulty. Her oxygen saturations have been 92 to 95 % on room air with exercise. She does not have any oxygen at home.She is a very active person especially with her church activities and has been arriving tired before starting and finishing her exercise sessions.      Expected Outcomes For Hodan to stay highly motivate to participate in the PR program. To imporve her SOB with her activities and to continue to use more efficent breathing techniques. For Shyanna to continue to have increasing workloads and MET levels on the exercise equipment. Improved SOB with ADL's and efficient breathing techniques. Also for her to pace herself with her  activities prior to coming in for the PR exercise sessions so that she is not exhausted by the time she has finished.               Core Components/Risk Factors/Patient Goals at Discharge (Final Review):   Goals and Risk Factor Review - 04/30/21 1033       Core Components/Risk Factors/Patient Goals Review   Personal Goals Review Improve shortness of breath with ADL's;Develop more efficient breathing techniques such as purse lipped breathing and diaphragmatic breathing and practicing self-pacing with activity.;Increase knowledge of respiratory medications and ability to use respiratory devices properly.    Review Anam is progressing well in the PR program on the TM and recumbent bike. She is on a speed of 2.5 and grade of 2 on the TM and level 2 on the recumbent bike. She states that the exercise is fairly light to somewhat hard and reports her SOB to be fairly light to fairly light with some difficulty. Her oxygen saturations have been 92 to 95 % on room air with exercise. She does not have any oxygen at home.She is a very active person especially with her church activities and has been arriving tired before starting and finishing her exercise sessions.    Expected Outcomes For Rubyann to continue to have increasing workloads and MET levels on the exercise equipment. Improved SOB with ADL's and efficient breathing techniques. Also for her to pace herself with her activities prior to coming in for the PR exercise sessions so that she is  not exhausted by the time she has finished.             ITP Comments:  ITP REVIEW Pt is making expected progress toward Pulmonary Rehab goals after completing 8 sessions. Recommend continued exercise, life style modification, education, and utilization of breathing techniques to increase stamina and strength, while also decreasing shortness of breath with exertion.   Dr. Rodman Pickle is Medical Director for Pulmonary Rehab at [facility].

## 2021-05-01 ENCOUNTER — Encounter (HOSPITAL_COMMUNITY)
Admission: RE | Admit: 2021-05-01 | Discharge: 2021-05-01 | Disposition: A | Discharge status: Home / Self Care | Payer: No Typology Code available for payment source | Source: Ambulatory Visit | Attending: Pulmonary Disease | Admitting: Pulmonary Disease

## 2021-05-01 ENCOUNTER — Other Ambulatory Visit: Payer: Self-pay

## 2021-05-01 DIAGNOSIS — J449 Chronic obstructive pulmonary disease, unspecified: Secondary | ICD-10-CM | POA: Diagnosis present

## 2021-05-01 NOTE — Progress Notes (Signed)
Daily Session Note ? ?Patient Details  ?Name: Patricia Johnston ?MRN: [id number] ?Date of Birth: 07/29/1950 ?Referring Provider:   ?Flowsheet Row Pulmonary Rehab Walk Test from 03/26/2021 in [place]  ?Referring Provider Clance  [[place] coverage]  ? ?  ? ? ?Encounter Date: 05/01/2021 ? ?Check In: ? Session Check In - 05/01/21 1435   ? ?  ? Check-In  ? Supervising physician immediately available to respond to emergencies [facility] immediately available   ? Physician(s) Dr.Gherghe   ? Location [facility]   ? Staff Present Rodney Langton, RN;Other;Kaylee Rosana Hoes, MS, ACSM-CEP, Exercise Physiologist   ? Virtual Visit No   ? Medication changes reported     No   ? Tobacco Cessation No Change   ? Warm-up and Cool-down Performed as group-led instruction   ? Resistance Training Performed Yes   ? VAD Patient? No   ?  ? Pain Assessment  ? Currently in Pain? No/denies   ? Multiple Pain Sites No   ? ?  ?  ? ?  ? ? ?Capillary Blood Glucose: ?No results found for this or any previous visit (from the past 24 hour(s)). ? ? ? ?Social History  ? ?Tobacco Use  ?Smoking Status Never  ?Smokeless Tobacco Never  ? ? ?Goals Met:  ?No report of concerns or symptoms today ?Strength training completed today ? ?Goals Unmet:  ?Not Applicable ? ?Comments: Service time is from 1321 to 1438 ?Today Patricia Johnston complained of SOB and feeling like the damp weather has made her breathing much harder today. ? ? ?Dr. Rodman Pickle is Medical Director for Pulmonary Rehab at [facility].  ?

## 2021-05-05 ENCOUNTER — Telehealth: Payer: Self-pay | Admitting: Pharmacy Technician

## 2021-05-05 NOTE — Telephone Encounter (Signed)
Auth Submission: PENDING ? ?Payer: [facility] ?Medication & CPT/J Code(s) submitted: Nucala (Mepolizumab) J6701 ?Route of submission (phone, fax, portal): PHONE: [phone] EXT [phone] ?FAX: [phone] ?Auth type: Buy/Bill ?Units/visits requested; 12 VISITS ?Reference number:  ?Patient will need community of care approved to be seen out-side of [facility] ?Community of Care form along with clinicals has been faxed: [phone] ?  ?

## 2021-05-06 ENCOUNTER — Encounter (HOSPITAL_COMMUNITY)
Admission: RE | Admit: 2021-05-06 | Discharge: 2021-05-06 | Disposition: A | Discharge status: Home / Self Care | Payer: No Typology Code available for payment source | Source: Ambulatory Visit | Attending: Pulmonary Disease | Admitting: Pulmonary Disease

## 2021-05-06 ENCOUNTER — Other Ambulatory Visit: Payer: Self-pay

## 2021-05-06 VITALS — Wt 137.3 lb

## 2021-05-06 DIAGNOSIS — J449 Chronic obstructive pulmonary disease, unspecified: Secondary | ICD-10-CM | POA: Diagnosis not present

## 2021-05-06 NOTE — Progress Notes (Signed)
Daily Session Note ? ?Patient Details  ?Name: Patricia Johnston ?MRN: [id number] ?Date of Birth: Feb 13, 1951 ?Referring Provider:   ?Flowsheet Row Pulmonary Rehab Walk Test from 03/26/2021 in [place]  ?Referring Provider Clance  [[place] coverage]  ? ?  ? ? ?Encounter Date: 05/06/2021 ? ?Check In: ? Session Check In - 05/06/21 1429   ? ?  ? Check-In  ? Supervising physician immediately available to respond to emergencies [facility] immediately available   ? Physician(s) Dr.Gherghe   ? [facility]   ? Staff Present Rodney Langton, Cathleen Fears, MS, ACSM-CEP, Exercise Physiologist;Annedrea Rosezella [place], RN, MHA;Carlette Wilber Oliphant, RN, Luisa Hart, RN, BSN   ? Virtual Visit No   ? Medication changes reported     No   ? Fall or balance concerns reported    No   ? Tobacco Cessation No Change   ? Warm-up and Cool-down Performed as group-led instruction   ? Resistance Training Performed Yes   ? VAD Patient? No   ? PAD/SET Patient? No   ?  ? Pain Assessment  ? Currently in Pain? No/denies   ? Multiple Pain Sites No   ? ?  ?  ? ?  ? ? ?Capillary Blood Glucose: ?No results found for this or any previous visit (from the past 24 hour(s)). ? ? Exercise Prescription Changes - 05/06/21 1500   ? ?  ? Response to Exercise  ? Blood Pressure (Admit) 102/50   ? Blood Pressure (Exercise) 120/60   ? Blood Pressure (Exit) 102/60   ? Heart Rate (Admit) 77 bpm   ? Heart Rate (Exercise) 91 bpm   ? Heart Rate (Exit) 68 bpm   ? Oxygen Saturation (Admit) 93 %   ? Oxygen Saturation (Exercise) 90 %   ? Oxygen Saturation (Exit) 92 %   ? Rating of Perceived Exertion (Exercise) 13   ? Perceived Dyspnea (Exercise) 2   ? Duration Continue with 30 min of aerobic exercise without signs/symptoms of physical distress.   ? Intensity THRR unchanged   ?  ? Progression  ? Progression Continue to progress workloads to maintain intensity without signs/symptoms of physical distress.   ?  ? Resistance  Training  ? Training Prescription Yes   ? Weight Red bands   ? Reps 10-15   ? Time 10 Minutes   ?  ? Treadmill  ? MPH 2.5   ? Grade 2   ? Minutes 15   ? METs 3.78   ?  ? Recumbant Bike  ? Level 2   ? Minutes 15   ? METs 2.1   ? ?  ?  ? ?  ? ? ?Social History  ? ?Tobacco Use  ?Smoking Status Never  ?Smokeless Tobacco Never  ? ? ?Goals Met:  ?Proper associated with RPD/PD & O2 Sat ?Independence with exercise equipment ?Exercise tolerated well ?No report of concerns or symptoms today ?Strength training completed today ? ?Goals Unmet:  ?Not Applicable ? ?Comments: Service time is from 1318 to 61.  ? ? ?Dr. Rodman Pickle is Medical Director for Pulmonary Rehab at [facility].  ?

## 2021-05-07 ENCOUNTER — Ambulatory Visit: Payer: TRICARE For Life (TFL)

## 2021-05-07 MED ORDER — ALBUTEROL SULFATE HFA 108 (90 BASE) MCG/ACT IN AERS: 2.0000 | INHALATION_SPRAY | Freq: Once | RESPIRATORY_TRACT | Status: DC | PRN

## 2021-05-07 MED ORDER — MEPOLIZUMAB 100 MG ~~LOC~~ SOLR
100.0000 mg | Freq: Once | SUBCUTANEOUS | Status: DC
  Filled 2021-05-07: qty 1

## 2021-05-07 MED ORDER — EPINEPHRINE 0.3 MG/0.3ML IJ SOAJ: 0.3000 mg | Freq: Once | INTRAMUSCULAR | Status: DC | PRN

## 2021-05-07 MED ORDER — FAMOTIDINE IN NACL 20-0.9 MG/50ML-% IV SOLN: 20.0000 mg | Freq: Once | INTRAVENOUS | Status: DC | PRN

## 2021-05-07 MED ORDER — METHYLPREDNISOLONE SODIUM SUCC 125 MG IJ SOLR: 125.0000 mg | Freq: Once | INTRAMUSCULAR | Status: DC | PRN

## 2021-05-07 MED ORDER — DIPHENHYDRAMINE HCL 50 MG/ML IJ SOLN: 50.0000 mg | Freq: Once | INTRAMUSCULAR | Status: DC | PRN

## 2021-05-07 MED ORDER — SODIUM CHLORIDE 0.9 % IV SOLN: Freq: Once | INTRAVENOUS | Status: DC | PRN

## 2021-05-07 NOTE — Telephone Encounter (Signed)
Still pending: will f/u next week due to back log @ [facility] for processing community of care requests ?Rep; Danton Sewer

## 2021-05-08 ENCOUNTER — Telehealth (HOSPITAL_COMMUNITY): Payer: Self-pay | Admitting: *Deleted

## 2021-05-08 ENCOUNTER — Encounter (HOSPITAL_COMMUNITY): Payer: No Typology Code available for payment source

## 2021-05-08 NOTE — Telephone Encounter (Signed)
Called Patricia Johnston to check on her since she was not at PR and we did not receive a call from her. I was not able to reach her but I was able to leave a message. I ask her to call her back to let us know that she was okay. ?

## 2021-05-09 ENCOUNTER — Other Ambulatory Visit: Payer: Self-pay

## 2021-05-09 ENCOUNTER — Ambulatory Visit (INDEPENDENT_AMBULATORY_CARE_PROVIDER_SITE_OTHER): Payer: Medicare Other

## 2021-05-09 VITALS — BP 120/63 | HR 74 | Temp 98.4°F | Resp 20 | Ht 65.0 in | Wt 138.4 lb

## 2021-05-09 DIAGNOSIS — R768 Other specified abnormal immunological findings in serum: Secondary | ICD-10-CM

## 2021-05-09 MED ORDER — SODIUM CHLORIDE 0.9 % IV SOLN: Freq: Once | INTRAVENOUS | Status: DC | PRN

## 2021-05-09 MED ORDER — ALBUTEROL SULFATE HFA 108 (90 BASE) MCG/ACT IN AERS: 2.0000 | INHALATION_SPRAY | Freq: Once | RESPIRATORY_TRACT | Status: DC | PRN

## 2021-05-09 MED ORDER — FAMOTIDINE IN NACL 20-0.9 MG/50ML-% IV SOLN: 20.0000 mg | Freq: Once | INTRAVENOUS | Status: DC | PRN

## 2021-05-09 MED ORDER — DIPHENHYDRAMINE HCL 50 MG/ML IJ SOLN: 50.0000 mg | Freq: Once | INTRAMUSCULAR | Status: DC | PRN

## 2021-05-09 MED ORDER — EPINEPHRINE 0.3 MG/0.3ML IJ SOAJ: 0.3000 mg | Freq: Once | INTRAMUSCULAR | Status: DC | PRN

## 2021-05-09 MED ORDER — METHYLPREDNISOLONE SODIUM SUCC 125 MG IJ SOLR: 125.0000 mg | Freq: Once | INTRAMUSCULAR | Status: DC | PRN

## 2021-05-09 MED ORDER — MEPOLIZUMAB 100 MG ~~LOC~~ SOLR
100.0000 mg | Freq: Once | SUBCUTANEOUS | Status: AC
  Administered 2021-05-09: 100 mg via SUBCUTANEOUS
  Filled 2021-05-09 (×2): qty 1

## 2021-05-09 NOTE — Progress Notes (Signed)
Diagnosis: Asthma ? ?Provider:  Praveen Mannam, MD ? ?Procedure: Injection ? ?Nucala (Mepolizumab), Dose: 100 mg, Site: subcutaneous, Number of injections: 1 ? ?Discharge: Condition: Good, Destination: Home . AVS provided to patient.  ? ?Performed by:  Ralston Venus, RN  ? ? ? ?  ?

## 2021-05-12 NOTE — Telephone Encounter (Signed)
Still pending due to back-log processing @ [place]

## 2021-05-13 ENCOUNTER — Encounter (HOSPITAL_COMMUNITY)
Admission: RE | Admit: 2021-05-13 | Discharge: 2021-05-13 | Disposition: A | Discharge status: Home / Self Care | Payer: No Typology Code available for payment source | Source: Ambulatory Visit | Attending: Pulmonary Disease | Admitting: Pulmonary Disease

## 2021-05-13 ENCOUNTER — Telehealth (HOSPITAL_COMMUNITY): Payer: Self-pay

## 2021-05-13 ENCOUNTER — Other Ambulatory Visit: Payer: Self-pay

## 2021-05-13 DIAGNOSIS — J449 Chronic obstructive pulmonary disease, unspecified: Secondary | ICD-10-CM | POA: Diagnosis not present

## 2021-05-13 NOTE — Telephone Encounter (Signed)
Called Patricia Johnston to let her know I can move her to the 10:15 am class. Patricia Johnston voiced understanding.  ?

## 2021-05-13 NOTE — Progress Notes (Signed)
Daily Session Note ? ?Patient Details  ?Name: Patricia Johnston ?MRN: [id number] ?Date of Birth: Apr 15, 1950 ?Referring Provider:   ?Flowsheet Row Pulmonary Rehab Walk Test from 03/26/2021 in [place]  ?Referring Provider Clance  [[place] coverage]  ? ?  ? ? ?Encounter Date: 05/13/2021 ? ?Check In: ? Session Check In - 05/13/21 1330   ? ?  ? Check-In  ? Supervising physician immediately available to respond to emergencies [facility] immediately available   ? Physician(s) Dr Alfredia Ferguson   ? Location [facility]   ? Staff Present Rodney Langton, Cathleen Fears, MS, ACSM-CEP, Exercise Physiologist;Other   ? Virtual Visit No   ? Medication changes reported     No   ? Fall or balance concerns reported    No   ? Tobacco Cessation No Change   ? Warm-up and Cool-down Performed as group-led instruction   ? Resistance Training Performed Yes   ? VAD Patient? No   ? PAD/SET Patient? No   ?  ? Pain Assessment  ? Currently in Pain? No/denies   ? Multiple Pain Sites No   ? ?  ?  ? ?  ? ? ?Capillary Blood Glucose: ?No results found for this or any previous visit (from the past 24 hour(s)). ? ? ? ?Social History  ? ?Tobacco Use  ?Smoking Status Never  ?Smokeless Tobacco Never  ? ? ?Goals Met:  ?Independence with exercise equipment ?Exercise tolerated well ?No report of concerns or symptoms today ?Strength training completed today ? ?Goals Unmet:  ?Not Applicable ? ?Comments: Service time is from 1330 to 1445. ? ? ? ?Dr. Rodman Pickle is Medical Director for Pulmonary Rehab at [facility].  ?

## 2021-05-15 ENCOUNTER — Other Ambulatory Visit: Payer: Self-pay

## 2021-05-15 ENCOUNTER — Encounter (HOSPITAL_COMMUNITY): Payer: No Typology Code available for payment source

## 2021-05-15 ENCOUNTER — Encounter (HOSPITAL_COMMUNITY)
Admission: RE | Admit: 2021-05-15 | Discharge: 2021-05-15 | Disposition: A | Discharge status: Home / Self Care | Payer: No Typology Code available for payment source | Source: Ambulatory Visit | Attending: Pulmonary Disease | Admitting: Pulmonary Disease

## 2021-05-15 VITALS — Wt 137.6 lb

## 2021-05-15 DIAGNOSIS — J449 Chronic obstructive pulmonary disease, unspecified: Secondary | ICD-10-CM | POA: Diagnosis not present

## 2021-05-15 NOTE — Progress Notes (Signed)
Daily Session Note ? ?Patient Details  ?Name: Patricia Johnston ?MRN: [id number] ?Date of Birth: 09-Feb-1951 ?Referring Provider:   ?Flowsheet Row Pulmonary Rehab Walk Test from 03/26/2021 in [place]  ?Referring Provider Clance  [Ellison coverage]  ? ?  ? ? ?Encounter Date: 05/15/2021 ? ?Check In: ? Session Check In - 05/15/21 1029   ? ?  ? Check-In  ? Supervising physician immediately available to respond to emergencies [facility] immediately available   ? Physician(s) Dr Cruzita Lederer   ? [facility]   ? Staff Present Rosebud Poles, RN, Milus Glazier, MS, ACSM-CEP, CCRP, Exercise Physiologist;Kaylee Rosana Hoes, MS, ACSM-CEP, Exercise Physiologist;Other   ? Virtual Visit No   ? Medication changes reported     No   ? Fall or balance concerns reported    No   ? Tobacco Cessation No Change   ? Warm-up and Cool-down Performed as group-led instruction   ? Resistance Training Performed Yes   ? VAD Patient? No   ? PAD/SET Patient? No   ?  ? Pain Assessment  ? Currently in Pain? No/denies   ? Multiple Pain Sites No   ? ?  ?  ? ?  ? ? ?Capillary Blood Glucose: ?No results found for this or any previous visit (from the past 24 hour(s)). ? ? ? ?Social History  ? ?Tobacco Use  ?Smoking Status Never  ?Smokeless Tobacco Never  ? ? ?Goals Met:  ?Independence with exercise equipment ?Exercise tolerated well ?No report of concerns or symptoms today ?Strength training completed today ? ?Goals Unmet:  ?Not Applicable ? ?Comments: Service time is from 1015 to 61.  ? ? ?Dr. Rodman Pickle is Medical Director for Pulmonary Rehab at [facility].  ?

## 2021-05-20 ENCOUNTER — Encounter (HOSPITAL_COMMUNITY): Payer: No Typology Code available for payment source

## 2021-05-20 ENCOUNTER — Telehealth (HOSPITAL_COMMUNITY): Payer: Self-pay | Admitting: Internal Medicine

## 2021-05-20 ENCOUNTER — Encounter (HOSPITAL_COMMUNITY)
Admission: RE | Admit: 2021-05-20 | Discharge: 2021-05-20 | Disposition: A | Discharge status: Home / Self Care | Payer: No Typology Code available for payment source | Source: Ambulatory Visit | Attending: Pulmonary Disease | Admitting: Pulmonary Disease

## 2021-05-20 NOTE — Telephone Encounter (Signed)
F/u: ?Nucala community of care request - still pending.

## 2021-05-22 ENCOUNTER — Encounter (HOSPITAL_COMMUNITY): Payer: No Typology Code available for payment source

## 2021-05-22 ENCOUNTER — Other Ambulatory Visit: Payer: Self-pay

## 2021-05-22 ENCOUNTER — Encounter (HOSPITAL_COMMUNITY)
Admission: RE | Admit: 2021-05-22 | Discharge: 2021-05-22 | Disposition: A | Discharge status: Home / Self Care | Payer: No Typology Code available for payment source | Source: Ambulatory Visit | Attending: Pulmonary Disease | Admitting: Pulmonary Disease

## 2021-05-22 DIAGNOSIS — J449 Chronic obstructive pulmonary disease, unspecified: Secondary | ICD-10-CM | POA: Diagnosis not present

## 2021-05-22 NOTE — Progress Notes (Signed)
Daily Session Note ? ?Patient Details  ?Name: Patricia Johnston ?MRN: [id number] ?Date of Birth: 1950-05-17 ?Referring Provider:   ?Flowsheet Row Pulmonary Rehab Walk Test from 03/26/2021 in [place]  ?Referring Provider Clance  [Ellison coverage]  ? ?  ? ? ?Encounter Date: 05/22/2021 ? ?Check In: ? Session Check In - 05/22/21 1114   ? ?  ? Check-In  ? Supervising physician immediately available to respond to emergencies [facility] immediately available   ? Physician(s) Dr. Pietro Cassis   ? [facility]   ? Staff Present Rosebud Poles, RN, Quentin Ore, MS, ACSM-CEP, Exercise Physiologist;Jetta Gilford Rile BS, ACSM EP-C, Exercise Physiologist   ? Virtual Visit No   ? Medication changes reported     No   ? Fall or balance concerns reported    No   ? Tobacco Cessation No Change   ? Warm-up and Cool-down Performed as group-led instruction   ? Resistance Training Performed Yes   ? VAD Patient? No   ? PAD/SET Patient? No   ?  ? Pain Assessment  ? Currently in Pain? No/denies   ? Multiple Pain Sites No   ? ?  ?  ? ?  ? ? ?Capillary Blood Glucose: ?No results found for this or any previous visit (from the past 24 hour(s)). ? ? ? ?Social History  ? ?Tobacco Use  ?Smoking Status Never  ?Smokeless Tobacco Never  ? ? ?Goals Met:  ?Proper associated with RPD/PD & O2 Sat ?Independence with exercise equipment ?Exercise tolerated well ?No report of concerns or symptoms today ?Strength training completed today ? ?Goals Unmet:  ?Not Applicable ? ?Comments: Service time is from 1020 to 1140.  ? ? ?Dr. Rodman Pickle is Medical Director for Pulmonary Rehab at [facility]  ?

## 2021-05-23 ENCOUNTER — Ambulatory Visit: Payer: TRICARE For Life (TFL) | Admitting: Student

## 2021-05-26 NOTE — Telephone Encounter (Signed)
Community of Care PA ?Patient was seen @ [place] on 05/07/21.  No referral for community of care was approved per Rep.  ? Left v/m with Landry Corporal (community of care nurse) ?Will f/u with response.

## 2021-05-27 ENCOUNTER — Other Ambulatory Visit: Payer: Self-pay

## 2021-05-27 ENCOUNTER — Encounter (HOSPITAL_COMMUNITY): Payer: No Typology Code available for payment source

## 2021-05-27 ENCOUNTER — Encounter (HOSPITAL_COMMUNITY)
Admission: RE | Admit: 2021-05-27 | Discharge: 2021-05-27 | Disposition: A | Discharge status: Home / Self Care | Payer: No Typology Code available for payment source | Source: Ambulatory Visit | Attending: Pulmonary Disease | Admitting: Pulmonary Disease

## 2021-05-27 DIAGNOSIS — J449 Chronic obstructive pulmonary disease, unspecified: Secondary | ICD-10-CM

## 2021-05-27 NOTE — Progress Notes (Signed)
Daily Session Note ? ?Patient Details  ?Name: Jamicia Haaland ?MRN: [id number] ?Date of Birth: 10-09-1950 ?Referring Provider:   ?Flowsheet Row Pulmonary Rehab Walk Test from 03/26/2021 in [place]  ?Referring Provider Clance  [Ellison coverage]  ? ?  ? ? ?Encounter Date: 05/27/2021 ? ?Check In: ? Session Check In - 05/27/21 1120   ? ?  ? Check-In  ? Supervising physician immediately available to respond to emergencies [facility] immediately available   ? Physician(s) Dr. Horton Chin   ? Location [facility]   ? Staff Present Rosebud Poles, RN, Quentin Ore, MS, ACSM-CEP, Exercise Physiologist;Lisa Ysidro Evert, RN   ? Virtual Visit No   ? Medication changes reported     No   ? Fall or balance concerns reported    No   ? Tobacco Cessation No Change   ? Warm-up and Cool-down Performed as group-led instruction   ? Resistance Training Performed Yes   ? VAD Patient? No   ? PAD/SET Patient? No   ?  ? Pain Assessment  ? Currently in Pain? No/denies   ? Multiple Pain Sites No   ? ?  ?  ? ?  ? ? ?Capillary Blood Glucose: ?No results found for this or any previous visit (from the past 24 hour(s)). ? ? ? ?Social History  ? ?Tobacco Use  ?Smoking Status Never  ?Smokeless Tobacco Never  ? ? ?Goals Met:  ?Proper associated with RPD/PD & O2 Sat ?Exercise tolerated well ?No report of concerns or symptoms today ?Strength training completed today ? ?Goals Unmet:  ?Not Applicable ? ?Comments: Service time is from 1025 to 1140 ? ? ? ?Dr. Rodman Pickle is Medical Director for Pulmonary Rehab at [facility].  ?

## 2021-05-28 NOTE — Progress Notes (Addendum)
Pulmonary Individual Treatment Plan ? ?Patient Details  ?Name: Patricia Johnston ?MRN: [id number] ?Date of Birth: 01/09/1951 ?Referring Provider:   ?Flowsheet Row Pulmonary Rehab Walk Test from 03/26/2021 in [place]  ?Referring Provider Clance  [Ellison coverage]  ? ?  ? ? ?Initial Encounter Date:  ?Flowsheet Row Pulmonary Rehab Walk Test from 03/26/2021 in [place]  ?Date 03/26/21  ? ?  ? ? ?Visit Diagnosis: Chronic obstructive pulmonary disease, unspecified COPD type ([place]) ? ?Patient's Home Medications on Admission:  ? ?Current Outpatient Medications:  ?  albuterol (PROVENTIL) (2.5 MG/3ML) 0.083% nebulizer solution, Take 2.5 mg by nebulization as needed for wheezing or shortness of breath., Disp: , Rfl:  ?  albuterol (VENTOLIN HFA) 108 (90 Base) MCG/ACT inhaler, Inhale 2 puffs into the lungs every 4 (four) hours as needed for wheezing or shortness of breath., Disp: 1 Inhaler, Rfl: 1 ?  alendronate (FOSAMAX) 70 MG tablet, Take 70 mg by mouth every Monday. Take with a full glass of water on an empty stomach., Disp: , Rfl:  ?  Calcium Carb-Cholecalciferol (CALCIUM 500+D PO), Take 1 tablet by mouth daily., Disp: , Rfl:  ?  diphenhydrAMINE (BENADRYL) 25 MG tablet, Take 25 mg by mouth every 6 (six) hours as needed (anxiety)., Disp: , Rfl:  ?  feeding supplement, ENSURE ENLIVE, (ENSURE ENLIVE) LIQD, Take 237 mLs by mouth 2 (two) times daily between meals., Disp: 237 mL, Rfl: 12 ?  guaiFENesin (MUCINEX) 600 MG 12 hr tablet, Take 1 tablet (600 mg total) by mouth 2 (two) times daily., Disp: 30 tablet, Rfl: 0 ?  mepolizumab (NUCALA) 100 MG/ML SOSY, INJECT '100MG'$  (1 PREFILL SYRINGE) SUBCUTANEOUSLY EVERY 28 DAYS (APPROVED) FOR CLINIC INFUSION IN [place] ONLY, Disp: , Rfl:  ?  mometasone (ASMANEX) 220 MCG/INH inhaler, Inhale 1 puff into the lungs 2 (two) times daily., Disp: 1 each, Rfl: 11 ?  Multiple Vitamins-Minerals (MULTIVITAMIN WITH MINERALS) tablet, Take  2 tablets by mouth daily., Disp: , Rfl:  ?  Tiotropium Bromide-Olodaterol 2.5-2.5 MCG/ACT AERS, Inhale 2 puffs into the lungs daily., Disp: 1 each, Rfl: 11 ?No current facility-administered medications for this encounter. ? ?Facility-Administered Medications Ordered in Other Encounters:  ?  0.9 %  sodium chloride infusion, , Intravenous, Once PRN, Verlee Monte, Hortencia Conradi, MD ?  albuterol (VENTOLIN HFA) 108 (90 Base) MCG/ACT inhaler 2 puff, 2 puff, Inhalation, Once PRN, Verlee Monte Hortencia Conradi, MD ?  diphenhydrAMINE (BENADRYL) injection 50 mg, 50 mg, Intravenous, Once PRN, Verlee Monte Hortencia Conradi, MD ?  EPINEPHrine (EPI-PEN) injection 0.3 mg, 0.3 mg, Intramuscular, Once PRN, Maryjane Hurter, MD ?  famotidine (PEPCID) IVPB 20 mg premix, 20 mg, Intravenous, Once PRN, Maryjane Hurter, MD ?  mepolizumab (NUCALA) injection 100 mg, 100 mg, Subcutaneous, Once, Maryjane Hurter, MD ?  methylPREDNISolone sodium succinate (SOLU-MEDROL) 125 mg/2 mL injection 125 mg, 125 mg, Intravenous, Once PRN, Verlee Monte Hortencia Conradi, MD ? ?Past Medical History: ?Past Medical History:  ?Diagnosis Date  ? Anxiety   ? Asthma   ? COPD (chronic obstructive pulmonary disease) ([place])   ? pt states due to [employer] service contracted COPD  ? Difficult intubation   ? Pt does not remember details over 30 years ago  ? Eczema   ? Esophageal thickening 10/13/2018  ? on CT- [facility]  ? GERD (gastroesophageal reflux disease)   ? Osteoporosis   ? PONV (postoperative nausea and vomiting)   ? Pulmonary emphysema ([place])   ? Pulmonary nodules   ?  Traumatic arthritis   ? Umbilical hernia   ? Wears glasses   ? ? ?Tobacco Use: ?Social History  ? ?Tobacco Use  ?Smoking Status Never  ?Smokeless Tobacco Never  ? ? ?Labs: ?Review Flowsheet   ? ?    ? View : No data to display.  ?  ?  ?  ?  ?  ? ? ?Capillary Blood Glucose: ?Lab Results  ?Component Value Date  ? GLUCAP 99 06/16/2017  ? ? ? ?Pulmonary Assessment Scores: ? Pulmonary Assessment Scores   ? ? [place]  Name 03/26/21 1031 05/15/21 1507  ?  ?  ? ADL UCSD  ? ADL Phase Entry Exit   ? SOB Score total 60 69   ?  ? CAT Score  ? CAT Score 29 19   ? ?  ?  ? ?  ? ?UCSD: ?Self-administered rating of dyspnea associated with activities of daily living (ADLs) ?6-point scale (0 = "not at all" to 5 = "maximal or unable to do because of breathlessness")  ?Scoring Scores range from 0 to 120.  Minimally important difference is 5 units ? ?CAT: ?CAT can identify the health impairment of COPD patients and is better correlated with disease progression.  ?CAT has a scoring range of zero to 40. The CAT score is classified into four groups of low (less than 10), medium (10 - 20), high (21-30) and very high (31-40) based on the impact level of disease on health status. A CAT score over 10 suggests significant symptoms.  A worsening CAT score could be explained by an exacerbation, poor medication adherence, poor inhaler technique, or progression of COPD or comorbid conditions.  ?CAT MCID is 2 points ? ?mMRC: ?mMRC (Modified Medical Research Council) Dyspnea Scale is used to assess the degree of baseline functional disability in patients of respiratory disease due to dyspnea. ?No minimal important difference is established. A decrease in score of 1 point or greater is considered a positive change.  ? ?Pulmonary Function Assessment: ? Pulmonary Function Assessment - 03/26/21 1039   ? ?  ? Breath  ? Bilateral Breath Sounds Decreased   Decreased in the bases  ? Shortness of Breath Yes;Limiting activity   She feels that she knows how to handle her SOB with PLB,Inhaler and 911 if neessary.SOB done not fear or panic her.  ? ?  ?  ? ?  ? ? ?Exercise Target Goals: ?Exercise Program Goal: ?Individual exercise prescription set using results from initial 6 min walk test and THRR while considering  patient?s activity barriers and safety.  ? ?Exercise Prescription Goal: ?Initial exercise prescription builds to 30-45 minutes a day of aerobic activity, 2-3  days per week.  Home exercise guidelines will be given to patient during program as part of exercise prescription that the participant will acknowledge. ? ?Activity Barriers & Risk Stratification: ? Activity Barriers & Cardiac Risk Stratification - 03/26/21 1011   ? ?  ? Activity Barriers & Cardiac Risk Stratification  ? Activity Barriers Deconditioning;Muscular Weakness;Shortness of Breath   ? Cardiac Risk Stratification Low   ? ?  ?  ? ?  ? ? ?6 Minute Walk: ? 6 Minute Walk   ? ? [place] Name 03/26/21 1104  ?  ?  ?  ? 6 Minute Walk  ? Phase Initial    ? Distance 1710 feet    ? Walk Time 6 minutes    ? # of Rest Breaks 0    ? MPH 3.24    ?  METS 3.68    ? RPE 11    ? Perceived Dyspnea  1    ? VO2 Peak 12.89    ? Symptoms No    ? Resting HR 82 bpm    ? Resting BP 112/60    ? Resting Oxygen Saturation  95 %    ? Exercise Oxygen Saturation  during 6 min walk 89 %    ? Max Ex. HR 97 bpm    ? Max Ex. BP 120/60    ? 2 Minute Post BP 118/60    ?  ? Interval HR  ? 1 Minute HR 90    ? 2 Minute HR 92    ? 3 Minute HR 92    ? 4 Minute HR 95    ? 5 Minute HR 97    ? 6 Minute HR 90    ? 2 Minute Post HR 76    ? Interval Heart Rate? Yes    ?  ? Interval Oxygen  ? Interval Oxygen? Yes    ? Baseline Oxygen Saturation % 95 %    ? 1 Minute Oxygen Saturation % 91 %    ? 1 Minute Liters of Oxygen 0 L    ? 2 Minute Oxygen Saturation % 90 %    ? 2 Minute Liters of Oxygen 0 L    ? 3 Minute Oxygen Saturation % 90 %    ? 3 Minute Liters of Oxygen 0 L    ? 4 Minute Oxygen Saturation % 89 %    ? 4 Minute Liters of Oxygen 0 L    ? 5 Minute Oxygen Saturation % 90 %    ? 5 Minute Liters of Oxygen 0 L    ? 6 Minute Oxygen Saturation % 91 %    ? 6 Minute Liters of Oxygen 0 L    ? 2 Minute Post Oxygen Saturation % 95 %    ? 2 Minute Post Liters of Oxygen 0 L    ? ?  ?  ? ?  ? ? ?Oxygen Initial Assessment: ? Oxygen Initial Assessment - 03/26/21 1014   ? ?  ? Home Oxygen  ? Home Oxygen Device None   ? Sleep Oxygen Prescription None   ? Home Exercise  Oxygen Prescription None   ? Home Resting Oxygen Prescription None   ?  ? Initial 6 min Walk  ? Oxygen Used None   ?  ? Program Oxygen Prescription  ? Program Oxygen Prescription None   ?  ? Intervention  ?

## 2021-05-29 ENCOUNTER — Encounter (HOSPITAL_COMMUNITY)
Admission: RE | Admit: 2021-05-29 | Discharge: 2021-05-29 | Disposition: A | Discharge status: Home / Self Care | Payer: No Typology Code available for payment source | Source: Ambulatory Visit | Attending: Pulmonary Disease | Admitting: Pulmonary Disease

## 2021-05-29 ENCOUNTER — Encounter (HOSPITAL_COMMUNITY): Payer: No Typology Code available for payment source

## 2021-05-29 DIAGNOSIS — J449 Chronic obstructive pulmonary disease, unspecified: Secondary | ICD-10-CM | POA: Diagnosis not present

## 2021-05-29 NOTE — Telephone Encounter (Signed)
Nucala: community of care still pending. ?Left 3rd v/m with Manuela Schwartz Hall/Rn ?Phone: [phone]

## 2021-05-29 NOTE — Progress Notes (Signed)
Discharge Progress Report ? ?Patient Details  ?Name: Patricia Johnston ?MRN: [id number] ?Date of Birth: [birth year] ?Referring Provider:   ?Flowsheet Row Pulmonary Rehab Walk Test from 03/26/2021 in [place]  ?Referring Provider Clance  [[place] coverage]  ? ?  ? ? ? ?Number of Visits: 15 ? ?Reason for Discharge:  ?Patient has met program and personal goals. ? ?Smoking History:  ?Social History  ? ?Tobacco Use  ?Smoking Status Never  ?Smokeless Tobacco Never  ? ? ?Diagnosis:  ?Chronic obstructive pulmonary disease, unspecified COPD type ([place]) ? ?ADL UCSD: ? Pulmonary Assessment Scores   ? ? [place] Name 03/26/21 1031 05/15/21 1507 05/29/21 1202  ?  ? ADL UCSD  ? ADL Phase Entry Exit --  ? SOB Score total 60 69 --  ?  ? CAT Score  ? CAT Score 29 19 --  ?  ? mMRC Score  ? mMRC Score -- -- 1  ? ?  ?  ? ?  ? ? ?Initial Exercise Prescription: ? Initial Exercise Prescription - 03/26/21 1100   ? ?  ? Date of Initial Exercise RX and Referring Provider  ? Date 03/26/21   ? Referring Provider Baltazar Najjar coverage  ? Expected Discharge Date 05/29/21   ?  ? Recumbant Bike  ? Level 2   ? Minutes 15   ? METs 3   ?  ? Track  ? Minutes 15   ? METs 3.68   ?  ? Prescription Details  ? Frequency (times per week) 2   ? Duration Progress to 30 minutes of continuous aerobic without signs/symptoms of physical distress   ?  ? Intensity  ? THRR 40-80% of Max Heartrate 60-120   ? Ratings of Perceived Exertion 11-13   ? Perceived Dyspnea 0-4   ?  ? Progression  ? Progression Continue progressive overload as per policy without signs/symptoms or physical distress.   ?  ? Resistance Training  ? Training Prescription Yes   ? Weight red bands   ? Reps 10-15   ? ?  ?  ? ?  ? ? ?Discharge Exercise Prescription (Final Exercise Prescription Changes): ? Exercise Prescription Changes - 05/15/21 1214   ? ?  ? Response to Exercise  ? Blood Pressure (Admit) 122/64   ? Blood Pressure (Exit) 106/66   ? Heart Rate (Admit) 76 bpm    ? Heart Rate (Exercise) 95 bpm   ? Heart Rate (Exit) 72 bpm   ? Oxygen Saturation (Admit) 96 %   ? Oxygen Saturation (Exercise) 93 %   ? Oxygen Saturation (Exit) 95 %   ? Rating of Perceived Exertion (Exercise) 11   ? Perceived Dyspnea (Exercise) 1   ? Duration Continue with 30 min of aerobic exercise without signs/symptoms of physical distress.   ? Intensity THRR unchanged   ?  ? Progression  ? Progression Continue to progress workloads to maintain intensity without signs/symptoms of physical distress.   ?  ? Resistance Training  ? Training Prescription Yes   ? Weight Red Bands   ? Reps 10-15   ? Time 10 Minutes   ?  ? Treadmill  ? MPH 2.5   ? Grade 2.5   ? Minutes 15   ?  ? Recumbant Bike  ? Level 2   ? Minutes 15   ? METs 3.3   ? ?  ?  ? ?  ? ? ?Functional Capacity: ? 6 Minute Walk   ? ?  [place] Name 03/26/21 1104 05/29/21 1202  ?  ?  ? 6 Minute Walk  ? Phase Initial Discharge   ? Distance 1710 feet 1780 feet   ? Distance % Change -- 4.09 %   ? Distance Feet Change -- 70 ft   ? Walk Time 6 minutes 6 minutes   ? # of Rest Breaks 0 0   ? MPH 3.24 3.37   ? METS 3.68 3.9   ? RPE 11 11   ? Perceived Dyspnea  1 2   ? VO2 Peak 12.89 13.66   ? Symptoms No No   ? Resting HR 82 bpm 81 bpm   ? Resting BP 112/60 110/50   ? Resting Oxygen Saturation  95 % 95 %   ? Exercise Oxygen Saturation  during 6 min walk 89 % 92 %   ? Max Ex. HR 97 bpm 106 bpm   ? Max Ex. BP 120/60 120/50   ? 2 Minute Post BP 118/60 110/20   ?  ? Interval HR  ? 1 Minute HR 90 98   ? 2 Minute HR 92 106   ? 3 Minute HR 92 102   ? 4 Minute HR 95 103   ? 5 Minute HR 97 106   ? 6 Minute HR 90 104   ? 2 Minute Post HR 76 85   ? Interval Heart Rate? Yes Yes   ?  ? Interval Oxygen  ? Interval Oxygen? Yes Yes   ? Baseline Oxygen Saturation % 95 % 95 %   ? 1 Minute Oxygen Saturation % 91 % 94 %   ? 1 Minute Liters of Oxygen 0 L 0 L   ? 2 Minute Oxygen Saturation % 90 % 92 %   ? 2 Minute Liters of Oxygen 0 L 0 L   ? 3 Minute Oxygen Saturation % 90 % 92 %   ? 3 Minute  Liters of Oxygen 0 L 0 L   ? 4 Minute Oxygen Saturation % 89 % 92 %   ? 4 Minute Liters of Oxygen 0 L 0 L   ? 5 Minute Oxygen Saturation % 90 % 92 %   ? 5 Minute Liters of Oxygen 0 L 0 L   ? 6 Minute Oxygen Saturation % 91 % 93 %   ? 6 Minute Liters of Oxygen 0 L 0 L   ? 2 Minute Post Oxygen Saturation % 95 % 95 %   ? 2 Minute Post Liters of Oxygen 0 L 0 L   ? ?  ?  ? ?  ? ? ?Psychological, QOL, Others - Outcomes: ?PHQ 2/9: ? ?  05/29/2021  ?  9:57 AM 03/26/2021  ? 10:07 AM 09/27/2015  ? 11:15 AM  ?Depression screen PHQ 2/9  ?Decreased Interest 0 0 0  ?Down, Depressed, Hopeless 0 0 0  ?PHQ - 2 Score 0 0 0  ?Altered sleeping 0 0   ?Tired, decreased energy 0 1   ?Change in appetite 0 0   ?Feeling bad or failure about yourself  0 0   ?Trouble concentrating 0 0   ?Moving slowly or fidgety/restless 0 0   ?Suicidal thoughts 0 0   ?PHQ-9 Score 0 1   ?Difficult doing work/chores Not difficult at all Not difficult at all   ? ? ?Quality of Life: ? ? ?Personal Goals: ?Goals established at orientation with interventions provided to work toward goal. ? Personal Goals and Risk Factors at  Admission - 03/26/21 1100   ? ?  ? Core Components/Risk Factors/Patient Goals on Admission  ? Improve shortness of breath with ADL's Yes   ? Intervention Provide education, individualized exercise plan and daily activity instruction to help decrease symptoms of SOB with activities of daily living.   ? Expected Outcomes Short Term: Improve cardiorespiratory fitness to achieve a reduction of symptoms when performing ADLs;Long Term: Be able to perform more ADLs without symptoms or delay the onset of symptoms   ? Increase knowledge of respiratory medications and ability to use respiratory devices properly  Yes   ? Intervention Provide education and demonstration as needed of appropriate use of medications, inhalers, and oxygen therapy.   ? Expected Outcomes Short Term: Achieves understanding of medications use. Understands that oxygen is a medication  prescribed by physician. Demonstrates appropriate use of inhaler and oxygen therapy.;Long Term: Maintain appropriate use of medications, inhalers, and oxygen therapy.   ? ?  ?  ? ?  ?  ? ?Personal Goals Discharge: ? Goals and Risk Factor Review   ? ? [place] Name 04/02/21 1009 04/30/21 1033 05/20/21 0852  ?  ?  ?  ? Core Components/Risk Factors/Patient Goals Review  ? Personal Goals Review Improve shortness of breath with ADL's;Increase knowledge of respiratory medications and ability to use respiratory devices properly.;Develop more efficient breathing techniques such as purse lipped breathing and diaphragmatic breathing and practicing self-pacing with activity. Improve shortness of breath with ADL's;Develop more efficient breathing techniques such as purse lipped breathing and diaphragmatic breathing and practicing self-pacing with activity.;Increase knowledge of respiratory medications and ability to use respiratory devices properly. Develop more efficient breathing techniques such as purse lipped breathing and diaphragmatic breathing and practicing self-pacing with activity.;Increase knowledge of respiratory medications and ability to use respiratory devices properly.;Improve shortness of breath with ADL's    ? Review Patricia Johnston has only had one exercise session but she is highly motivated to participate in the PR program. She is performing well and at a high level even on her first session.Patricia Johnston is progressing well in the PR program on the TM and recumbent bike. She is on a speed of 2.5 and grade of 2 on the TM and level 2 on the recumbent bike. She states that the exercise is fairly light to somewhat hard and reports her SOB to be fairly light to fairly light with some difficulty. Her oxygen saturations have been 92 to 95 % on room air with exercise. She does not have any oxygen at home.She is a very active person especially with her church activities and has been arriving tired before starting and finishing  her exercise sessions. Patricia Johnston graduates from pulmonary rehab in 2 wweks, she has progressed well and is exercising at a high level, 3.3 mets on the recumbent bike and 2.5 mph and 2.5% incline on the t

## 2021-05-29 NOTE — Progress Notes (Signed)
Daily Session Note ? ?Patient Details  ?Name: Patricia Johnston ?MRN: [id number] ?Date of Birth: Jul 18, 1950 ?Referring Provider:   ?Flowsheet Row Pulmonary Rehab Walk Test from 03/26/2021 in [place]  ?Referring Provider Clance  [[place] coverage]  ? ?  ? ? ?Encounter Date: 05/29/2021 ? ?Check In: ? ? ?Capillary Blood Glucose: ?No results found for this or any previous visit (from the past 24 hour(s)). ? ? ? ?Social History  ? ?Tobacco Use  ?Smoking Status Never  ?Smokeless Tobacco Never  ? ? ?Goals Met:  ?Proper associated with RPD/PD & O2 Sat ?Independence with exercise equipment ?Exercise tolerated well ?No report of concerns or symptoms today ?Strength training completed today ? ?Goals Unmet:  ?Not Applicable ? ?Comments: Service time is from 0950 to 1145. Completed post 6 MWT. ? ? ?Dr. Rodman Pickle is Medical Director for Pulmonary Rehab at [facility].  ?

## 2021-05-30 NOTE — Telephone Encounter (Signed)
Auth Submission: APPROVED ?Payer: [employer] ?Medication & CPT/J Code(s) submitted: Nucala (Mepolizumab) L2957 ?Route of submission (phone, fax, portal): PHONE ?COMMUNITY OF CARE; [place] [id number] ?Auth type: Buy/Bill ?Units/visits requested: '100mg'$  q4wks ?Reference number: [id number] ?Approval from: 05/27/21 to 12/06/21

## 2021-06-09 ENCOUNTER — Telehealth: Payer: Self-pay

## 2021-06-09 ENCOUNTER — Ambulatory Visit: Payer: TRICARE For Life (TFL)

## 2021-06-19 ENCOUNTER — Telehealth: Payer: Self-pay | Admitting: Student

## 2021-06-19 NOTE — Telephone Encounter (Signed)
Left voicemail informing patient we received new VA authorization and asked if she would like to reschedule her PFT and OV with Dr. Verlee Monte.  ?

## 2021-08-26 DIAGNOSIS — H61303 Acquired stenosis of external ear canal, unspecified, bilateral: Secondary | ICD-10-CM | POA: Insufficient documentation

## 2021-08-26 DIAGNOSIS — H6123 Impacted cerumen, bilateral: Secondary | ICD-10-CM | POA: Insufficient documentation

## 2021-08-29 ENCOUNTER — Telehealth: Payer: Self-pay | Admitting: Internal Medicine

## 2021-08-29 NOTE — Telephone Encounter (Signed)
OK to transfer her care to me. She needs an office appt with me or an APP to evaluate her for colonoscopy and EGD.

## 2021-08-29 NOTE — Telephone Encounter (Signed)
Good Afternoon Dr. Hilarie Fredrickson,   Patient called stating that she wanted to have a colonoscopy and endoscopy with Dr. Fuller Plan. After looking at her chart she was last seen by you in 2020. Patient stated that she had her last colon 10 years ago, looking at her visit with you it states she needs to repeat her colonoscopy in 10 years and she is due in September. She also stated that her  PCP is wanting her to have the EGD. Are you okay with Dr. Fuller Plan taking on her care?  Please advise.

## 2021-08-29 NOTE — Telephone Encounter (Signed)
Dr. Fuller Plan,   Are you okay with taking on care for patient?  Please advise.

## 2021-08-29 NOTE — Telephone Encounter (Signed)
Ok with me I saw her once in 2020, recommended a barium esophagram which showed some possible abnormalities in the distal esophagus.  I then recommended upper endoscopy but she declined at that time. See note attached to the barium esophagram result JMP

## 2021-09-01 NOTE — Telephone Encounter (Signed)
Patient was scheduled for 8/30 at 9:10am

## 2021-10-26 NOTE — Progress Notes (Unsigned)
Synopsis: Referred for COPD by Joseph Art, MD  Subjective:   PATIENT ID: Patricia Johnston GENDER: female DOB: Aug 21, 1950, MRN: [id number]  No chief complaint on file.  38yF with history of COPD previously followed by Dr. Dillard Essex and Dr. Gwenette Greet. Also noted to have elevated IgE and eosinophilia. Had been under consideration for nucala.  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 It has helped her functional independence, dyspnea. She never got prolonged course of steroids or itraconazole by her report. On stiolto, asmanex with spacer, albuterol prn. She hasn't had bothersome cough in a while.   She had an adverse reaction to fasenra (tried it one month). Irritable, anxious, insomnia. Went back to nucala 08/2020. She thinks she now sees Dr. Garald Balding at allergy at [facility].   Goes for walks, gardens. Works in her ministry everyday as Restaurant manager, fast food.   Interval HPI: Chest tightness, dyspnea last visit, remarkably elevated IgE considered HRCT vs CTA Chest but unable to get in touch with [name] at the time   Otherwise pertinent review of systems is negative.  Past Medical History:  Diagnosis Date   Anxiety    Asthma    COPD (chronic obstructive pulmonary disease) ([place])    pt states due to [employer] service contracted COPD   Difficult intubation    Pt does not remember details over 30 years ago   Eczema    Esophageal thickening 10/13/2018   on [facility]   GERD (gastroesophageal reflux disease)    Osteoporosis    PONV (postoperative nausea and vomiting)    Pulmonary emphysema (HCC)    Pulmonary nodules    Traumatic arthritis    Umbilical hernia    Wears glasses      Family History  Problem Relation Age of Onset   Heart attack Mother    Colon cancer Neg Hx    Esophageal cancer Neg Hx      Past Surgical History:  Procedure Laterality Date   ABDOMINAL HYSTERECTOMY     bunionectomy     COLONOSCOPY     due  2022 or 2023 per patient   CYST EXCISION Right 07/05/2019   Procedure: Excision of right ear canal cyst;  Surgeon: Helayne Seminole, MD;  Location: [facility] OR;  Service: ENT;  Laterality: Right;   dental implant      Social History   Socioeconomic History   Marital status: Single    Spouse name: Not on file   Number of children: Not on file   Years of education: 16   Highest education level: Bachelor's degree (e.g., BA, AB, BS)  Occupational History   Occupation: retired   Occupation: Designer, 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  Tobacco Use   Smoking status: Never   Smokeless tobacco: Never  Scientific laboratory technician Use: Never used  Substance and Sexual Activity   Alcohol use: Never    Comment: ocassionally   Drug use: No   Sexual activity: Not Currently  Other Topics Concern   Not on file  Social History Narrative   Not on file   Social Determinants of Health   Financial Resource Strain: Not on file  Food Insecurity: Not on file  Transportation Needs: Not on file  Physical Activity: Not on file  Stress: Not on file  Social Connections: Not on file  Intimate Partner Violence: Not on file     Allergies  Allergen Reactions   Flonase [Fluticasone  Propionate] Other (See Comments)    Nasal bleeding   Nasacort [Triamcinolone] Other (See Comments)    Nasal bleeding     Outpatient Medications Prior to Visit  Medication Sig Dispense Refill   albuterol (PROVENTIL) (2.5 MG/3ML) 0.083% nebulizer solution Take 2.5 mg by nebulization as needed for wheezing or shortness of breath.     albuterol (VENTOLIN HFA) 108 (90 Base) MCG/ACT inhaler Inhale 2 puffs into the lungs every 4 (four) hours as needed for wheezing or shortness of breath. 1 Inhaler 1   alendronate (FOSAMAX) 70 MG tablet Take 70 mg by mouth every Monday. Take with a full glass of water on an empty stomach.     Calcium Carb-Cholecalciferol (CALCIUM 500+D PO) Take 1 tablet by mouth daily.     diphenhydrAMINE (BENADRYL)  25 MG tablet Take 25 mg by mouth every 6 (six) hours as needed (anxiety).     feeding supplement, ENSURE ENLIVE, (ENSURE ENLIVE) LIQD Take 237 mLs by mouth 2 (two) times daily between meals. 237 mL 12   guaiFENesin (MUCINEX) 600 MG 12 hr tablet Take 1 tablet (600 mg total) by mouth 2 (two) times daily. 30 tablet 0   mometasone (ASMANEX) 220 MCG/INH inhaler Inhale 1 puff into the lungs 2 (two) times daily. 1 each 11   Multiple Vitamins-Minerals (MULTIVITAMIN WITH MINERALS) tablet Take 2 tablets by mouth daily.     Tiotropium Bromide-Olodaterol 2.5-2.5 MCG/ACT AERS Inhale 2 puffs into the lungs daily. 1 each 11   Facility-Administered Medications Prior to Visit  Medication Dose Route Frequency Provider Last Rate Last Admin   0.9 %  sodium chloride infusion   Intravenous Once PRN Maryjane Hurter, MD       albuterol (VENTOLIN HFA) 108 (90 Base) MCG/ACT inhaler 2 puff  2 puff Inhalation Once PRN Maryjane Hurter, MD       diphenhydrAMINE (BENADRYL) injection 50 mg  50 mg Intravenous Once PRN Maryjane Hurter, MD       EPINEPHrine (EPI-PEN) injection 0.3 mg  0.3 mg Intramuscular Once PRN Maryjane Hurter, MD       famotidine (PEPCID) IVPB 20 mg premix  20 mg Intravenous Once PRN Maryjane Hurter, MD       mepolizumab (NUCALA) injection 100 mg  100 mg Subcutaneous Once Maryjane Hurter, MD       methylPREDNISolone sodium succinate (SOLU-MEDROL) 125 mg/2 mL injection 125 mg  125 mg Intravenous Once PRN Maryjane Hurter, MD           Objective:   Physical Exam:  General appearance: 71 y.o., female, NAD, conversant  Eyes: anicteric sclerae, moist conjunctivae; no lid-lag; PERRL, tracking appropriately HENT: NCAT; oropharynx, MMM, no mucosal ulcerations; normal hard and soft palate Neck: Trachea midline; no lymphadenopathy, no JVD Lungs: CTAB, no crackles, no wheeze, with normal respiratory effort CV: RRR, no MRGs  Abdomen: Soft, non-tender; non-distended, BS present  Extremities:  No peripheral edema, radial and DP pulses present bilaterally  Skin: Normal temperature, turgor and texture; no rash Psych: Appropriate affect Neuro: Alert and oriented to person and place, no focal deficit    There were no vitals filed for this visit.     on RA BMI Readings from Last 3 Encounters:  05/20/21 22.89 kg/m  05/09/21 23.03 kg/m  05/06/21 22.86 kg/m   Wt Readings from Last 3 Encounters:  05/20/21 137 lb 9.1 oz (62.4 kg)  05/09/21 138 lb 6.4 oz (62.8 kg)  05/06/21 137 lb 5.6 oz (62.3 kg)  CBC    Component Value Date/Time   WBC 3.0 (L) 04/23/2021 1219   RBC 4.26 04/23/2021 1219   HGB 13.4 04/23/2021 1219   HCT 40.3 04/23/2021 1219   PLT 192.0 04/23/2021 1219   MCV 94.7 04/23/2021 1219   MCH 31.5 07/05/2019 0842   MCHC 33.1 04/23/2021 1219   RDW 13.1 04/23/2021 1219   LYMPHSABS 1.6 04/23/2021 1219   MONOABS 0.3 04/23/2021 1219   EOSABS 0.1 04/23/2021 1219   BASOSABS 0.1 04/23/2021 1219    [facility] [place] [facility]ophil count of 790, IgE 2691, IgM 29 mg/DL, normal IgG and IgA.   Chest Imaging: 01/2019 CT Chest with focal bronchial dilatation RUL, micronodular infiltrate  01/2017 CT Chest reviewed by me and remarkable for bronchial wall thickening scattered distal airways mucus plugging, scarring, small nodules  Skin test positivity to aspergillus  Pulmonary Functions Testing Results:     No data to display          FEV1 47% pred 06/2019  PFTs 2017 with ratio 38, FEV1 48% pred with + BD response, hyperinflation, very severe air trapping  Echocardiogram:   None     Assessment & Plan:   # Dyspnea Unclear etiology. Doesn't really sound like she's in exacerbation.  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 Has not needed steroid burst since last visit with pulmonary. Never did get antifungal or prolonged steroid course prior to initiation of nucala.    Plan: -  IgE, cbc/diff, BMP, BNP - full PFT next available - ONO - continue nucala for now - refilled stiolto, asmanex (using with spacer) - screen for OSA next visit     Maryjane Hurter, MD [facility] 10/26/2021 6:08 PM

## 2021-10-29 ENCOUNTER — Ambulatory Visit (INDEPENDENT_AMBULATORY_CARE_PROVIDER_SITE_OTHER): Payer: Medicare Other | Admitting: Gastroenterology

## 2021-10-29 ENCOUNTER — Ambulatory Visit (INDEPENDENT_AMBULATORY_CARE_PROVIDER_SITE_OTHER): Payer: Medicare Other | Admitting: Student

## 2021-10-29 ENCOUNTER — Encounter: Payer: Self-pay | Admitting: Gastroenterology

## 2021-10-29 ENCOUNTER — Encounter: Payer: Self-pay | Admitting: Student

## 2021-10-29 VITALS — BP 98/60 | HR 77 | Temp 98.0°F

## 2021-10-29 VITALS — BP 110/68 | HR 74 | Wt 134.6 lb

## 2021-10-29 DIAGNOSIS — J449 Chronic obstructive pulmonary disease, unspecified: Secondary | ICD-10-CM | POA: Diagnosis not present

## 2021-10-29 DIAGNOSIS — R1310 Dysphagia, unspecified: Secondary | ICD-10-CM | POA: Diagnosis not present

## 2021-10-29 DIAGNOSIS — K219 Gastro-esophageal reflux disease without esophagitis: Secondary | ICD-10-CM

## 2021-10-29 DIAGNOSIS — R933 Abnormal findings on diagnostic imaging of other parts of digestive tract: Secondary | ICD-10-CM | POA: Diagnosis not present

## 2021-10-29 DIAGNOSIS — U071 COVID-19: Secondary | ICD-10-CM | POA: Diagnosis not present

## 2021-10-29 DIAGNOSIS — Z1211 Encounter for screening for malignant neoplasm of colon: Secondary | ICD-10-CM

## 2021-10-29 DIAGNOSIS — Z1212 Encounter for screening for malignant neoplasm of rectum: Secondary | ICD-10-CM | POA: Diagnosis not present

## 2021-10-29 LAB — POC COVID19 BINAXNOW: SARS Coronavirus 2 Ag: POSITIVE — AB

## 2021-10-29 MED ORDER — PAXLOVID (300/100) 20 X 150 MG & 10 X 100MG PO TBPK: ORAL_TABLET | ORAL | 0 refills | Status: DC

## 2021-10-29 MED ORDER — PLENVU 140 G PO SOLR: 1.0000 | Freq: Once | ORAL | 0 refills | Status: AC

## 2021-10-29 NOTE — Progress Notes (Signed)
    Assessment     Abnormal esophagram, abnormal distal esophageal thickening on chest CT, suspected GERD, intermittent dysphagia, esophageal dysmotility.  Rule out esophagitis, rule out Barrett's. CRC screening, average risk   Recommendations    Follow antireflux measures and continue Tums as needed Schedule colonoscopy and EGD. The risks (including bleeding, perforation, infection, missed lesions, medication reactions and possible hospitalization or surgery if complications occur), benefits, and alternatives to colonoscopy with possible biopsy and possible polypectomy were discussed with the patient and they consent to proceed.  The risks (including bleeding, perforation, infection, missed lesions, medication reactions and possible hospitalization or surgery if complications occur), benefits, and alternatives to endoscopy with possible biopsy and possible dilation were discussed with the patient and they consent to proceed.     HPI    This is a 71 year old female with an abnormal esophagram, abnormal chest CT, GERD and hemorrhoids.  She was evaluated by Dr. Hilarie Fredrickson in October 2020 and requested to change physicians to me as her sister is my patient.  Chest CT scan in 2020 showed distal esophageal thickening.  Barium esophagram in October 2020 showed granular mucosa in the distal esophagus and esophageal dysmotility.  EGD was recommended however it was not completed.  She relates intermittent reflux symptoms and intermittent difficulties swallowing some solid foods.  Her symptoms have not changed over the past few years.  She takes Tums as needed.  She relates she had a colonoscopy performed in September 2013 for screening which showed hemorrhoids. Denies weight loss, abdominal pain, constipation, diarrhea, change in stool caliber, melena, hematochezia, nausea, vomiting, chest pain.   Labs / Imaging       Latest Ref Rng & Units 05/24/2016    5:24 AM  Hepatic Function  Total Protein 6.5 - 8.1  g/dL 6.7   Albumin 3.5 - 5.0 g/dL 3.8   AST 15 - 41 U/L 25   ALT 14 - 54 U/L 20   Alk Phosphatase 38 - 126 U/L 70   Total Bilirubin 0.3 - 1.2 mg/dL 0.7        Latest Ref Rng & Units 04/23/2021   12:19 PM 07/05/2019    8:42 AM 06/25/2018    8:38 PM  CBC  WBC 4.0 - 10.5 K/uL 3.0  3.8  5.6   Hemoglobin 12.0 - 15.0 g/dL 13.4  13.4  13.8   Hematocrit 36.0 - 46.0 % 40.3  40.2  41.2   Platelets 150.0 - 400.0 K/uL 192.0  205  198     Current Medications, Allergies, Past Medical History, Past Surgical History, Family History and Social History were reviewed in [employer] record.   Physical Exam: General: Well developed, well nourished, no acute distress Head: Normocephalic and atraumatic Eyes: Sclerae anicteric, EOMI Ears: Normal auditory acuity Mouth: Not examined Lungs: Clear throughout to auscultation Heart: Regular rate and rhythm; no murmurs, rubs or bruits Abdomen: Soft, non tender and non distended. No masses, hepatosplenomegaly or hernias noted. Normal Bowel sounds Rectal: deferred to colonoscopy  Musculoskeletal: Symmetrical with no gross deformities  Pulses:  Normal pulses noted Extremities: No clubbing, cyanosis, edema or deformities noted Neurological: Alert oriented x 4, grossly nonfocal Psychological:  Alert and cooperative. Normal mood and affect   Patricia Johnston T. Fuller Plan, MD 10/29/2021, 9:19 AM

## 2021-10-29 NOTE — Patient Instructions (Addendum)
-   We will request disc with recent CT Chest on it, would encourage you to also request a disc with CT on it to bring to next appointment - Paxlovid prescription sent to [facility] - Would give some consideration to dupixent or fasenra if you need another course of steroids this year - See you in 3 months or sooner if need be!

## 2021-10-29 NOTE — Patient Instructions (Signed)
You have been scheduled for an endoscopy and colonoscopy. Please follow the written instructions given to you at your visit today. Please pick up your prep supplies at the pharmacy within the next 1-3 days. If you use inhalers (even only as needed), please bring them with you on the day of your procedure.  The [facility] providers would like to encourage you to use MYCHART to communicate with providers for non-urgent requests or questions.  Due to long hold times on the telephone, sending your provider a message by MYCHART may be a faster and more efficient way to get a response.  Please allow 48 business hours for a response.  Please remember that this is for non-urgent requests.   Due to recent changes in healthcare laws, you may see the results of your imaging and laboratory studies on MyChart before your provider has had a chance to review them.  We understand that in some cases there may be results that are confusing or concerning to you. Not all laboratory results come back in the same time frame and the provider may be waiting for multiple results in order to interpret others.  Please give us 48 hours in order for your provider to thoroughly review all the results before contacting the office for clarification of your results.   Thank you for choosing me and [facility].  Malcolm T. Stark, Jr., MD., FACG  

## 2021-10-29 NOTE — Addendum Note (Signed)
Addended by: Rosana Berger on: 10/29/2021 02:03 PM   Modules accepted: Orders

## 2021-10-31 ENCOUNTER — Telehealth: Payer: Self-pay | Admitting: Student

## 2021-10-31 NOTE — Telephone Encounter (Signed)
Created in error

## 2021-11-17 ENCOUNTER — Telehealth: Payer: Self-pay | Admitting: Gastroenterology

## 2021-11-17 MED ORDER — PEG-KCL-NACL-NASULF-NA ASC-C 100 G PO SOLR: 1.0000 | Freq: Once | ORAL | 0 refills | Status: AC

## 2021-11-17 NOTE — Telephone Encounter (Signed)
Inbound call from patient stating she has spoken with the [place] and they have recommended ''Movie prep''. Patient has a procedure scheduled for 11/26/21. Please advise.  Thank you

## 2021-11-17 NOTE — Telephone Encounter (Signed)
Informed patient that I can send Movi prep to her pharmacy and mail her new instructions for the colonoscopy. Informed patient if she does not receive the instructions in the mail then she needs to let our office know. Patient verbalized understanding.

## 2021-11-21 ENCOUNTER — Telehealth: Payer: Self-pay | Admitting: *Deleted

## 2021-11-21 ENCOUNTER — Encounter: Payer: Self-pay | Admitting: *Deleted

## 2021-11-21 NOTE — Patient Instructions (Signed)
Visit Information  Thank you for taking time to visit with me today. Please don't hesitate to contact me if I can be of assistance to you.   Following are the goals we discussed today:   Goals Addressed               This Visit's Progress     COMPLETED: "Concerning about nodules that were found on her lungs" (pt-stated)        Care Coordination Interventions: Advised patient to discuss with upcoming appointment with Dr. Marjory Lies as this was her request mentioned on today's assessment Reviewed medications with patient and discussed adherence and verified no needed refills. Reviewed scheduled/upcoming provider appointments including pending appointments and requested pt scheduled her AWV with primary provider this year. Screening for signs and symptoms of depression related to chronic disease state  Assessed social determinant of health barriers         Please call the care guide team at [phone] if you need to cancel or reschedule your appointment.   If you are experiencing a Mental Health or [place] or need someone to talk to, please call the Suicide and Crisis Lifeline: 988 call the [place] National Suicide Prevention Lifeline: (217)029-7926 or TTY: 430-276-4740 TTY 323-212-9706) to talk to a trained counselor call 1-800-273-TALK (toll free, 24 hour hotline)  The patient verbalized understanding of instructions, educational materials, and care plan provided today and DECLINED offer to receive copy of patient instructions, educational materials, and care plan.   No further follow up required: No additional needs at this time.  Raina Mina, RN Care Management Coordinator [place] Office [phone]

## 2021-11-21 NOTE — Patient Outreach (Signed)
  Care Coordination   Initial Visit Note   11/21/2021 Name: Patricia Johnston MRN: [id number] DOB: November 22, 1950  Patricia Johnston is a 71 y.o. year old female who sees Joseph Art, MD for primary care. I spoke with  Patricia Johnston by phone today.  What matters to the patients health and wellness today?  Concerns about nodules found on her lungs    Goals Addressed               This Visit's Progress     COMPLETED: "Concerning about nodules that were found on her lungs" (pt-stated)        Care Coordination Interventions: Advised patient to discuss with upcoming appointment with Dr. Marjory Lies as this was her request mentioned on today's assessment Reviewed medications with patient and discussed adherence and verified no needed refills. Reviewed scheduled/upcoming provider appointments including pending appointments and requested pt scheduled her AWV with primary provider this year. Screening for signs and symptoms of depression related to chronic disease state  Assessed social determinant of health barriers         SDOH assessments and interventions completed:  Yes  SDOH Interventions Today    Flowsheet Row Most Recent Value  SDOH Interventions   Food Insecurity Interventions Intervention Not Indicated  Housing Interventions Intervention Not Indicated  Transportation Interventions Intervention Not Indicated        Care Coordination Interventions Activated:  Yes  Care Coordination Interventions:  Yes, provided   Follow up plan: No further intervention required.   Encounter Outcome:  Pt. Visit Completed   Raina Mina, RN Care Management Coordinator [place] Office [phone]

## 2021-11-25 ENCOUNTER — Other Ambulatory Visit: Payer: Self-pay | Admitting: Pharmacy Technician

## 2021-11-26 ENCOUNTER — Encounter: Payer: Self-pay | Admitting: Gastroenterology

## 2021-11-26 ENCOUNTER — Ambulatory Visit (AMBULATORY_SURGERY_CENTER): Payer: Medicare Other | Admitting: Gastroenterology

## 2021-11-26 VITALS — BP 108/63 | HR 60 | Temp 97.8°F | Resp 12 | Ht 66.0 in | Wt 134.0 lb

## 2021-11-26 DIAGNOSIS — K449 Diaphragmatic hernia without obstruction or gangrene: Secondary | ICD-10-CM

## 2021-11-26 DIAGNOSIS — K219 Gastro-esophageal reflux disease without esophagitis: Secondary | ICD-10-CM | POA: Diagnosis not present

## 2021-11-26 DIAGNOSIS — D123 Benign neoplasm of transverse colon: Secondary | ICD-10-CM

## 2021-11-26 DIAGNOSIS — R933 Abnormal findings on diagnostic imaging of other parts of digestive tract: Secondary | ICD-10-CM

## 2021-11-26 DIAGNOSIS — K2210 Ulcer of esophagus without bleeding: Secondary | ICD-10-CM

## 2021-11-26 DIAGNOSIS — Z1212 Encounter for screening for malignant neoplasm of rectum: Secondary | ICD-10-CM | POA: Diagnosis not present

## 2021-11-26 DIAGNOSIS — R1310 Dysphagia, unspecified: Secondary | ICD-10-CM | POA: Diagnosis not present

## 2021-11-26 DIAGNOSIS — Z1211 Encounter for screening for malignant neoplasm of colon: Secondary | ICD-10-CM

## 2021-11-26 MED ORDER — SODIUM CHLORIDE 0.9 % IV SOLN: 500.0000 mL | Freq: Once | INTRAVENOUS | Status: AC

## 2021-11-26 MED ORDER — PANTOPRAZOLE SODIUM 40 MG PO TBEC: 40.0000 mg | DELAYED_RELEASE_TABLET | Freq: Every day | ORAL | 3 refills | Status: AC

## 2021-11-26 NOTE — Op Note (Signed)
[facility] Patient Name: Patricia Johnston Procedure Date: 11/26/2021 2:36 PM MRN: [id number] Endoscopist: Ladene Artist , MD Age: 71 Referring MD:  Date of Birth: [birth year] Gender: Female Account #: [id number] Procedure:                Colonoscopy Indications:              Screening for colorectal malignant neoplasm Medicines:                Monitored Anesthesia Care Procedure:                Pre-Anesthesia Assessment:                           - Prior to the procedure, a History and Physical                            was performed, and patient medications and                            allergies were reviewed. The patient's tolerance of                            previous anesthesia was also reviewed. The risks                            and benefits of the procedure and the sedation                            options and risks were discussed with the patient.                            All questions were answered, and informed consent                            was obtained. Prior Anticoagulants: The patient has                            taken no previous anticoagulant or antiplatelet                            agents. ASA Grade Assessment: II - A patient with                            mild systemic disease. After reviewing the risks                            and benefits, the patient was deemed in                            satisfactory condition to undergo the procedure.                           After obtaining informed consent, the colonoscope  was passed under direct vision. Throughout the                            procedure, the patient's blood pressure, pulse, and                            oxygen saturations were monitored continuously. The                            Olympus PCF-H190DL ([id number]) Colonoscope was                            introduced through the anus and advanced to the the                            cecum,  identified by appendiceal orifice and                            ileocecal valve. The ileocecal valve, appendiceal                            orifice, and rectum were photographed. The quality                            of the bowel preparation was adequate. The                            colonoscopy was performed without difficulty. The                            patient tolerated the procedure well. Scope In: 2:48:13 PM Scope Out: 3:10:56 PM Scope Withdrawal Time: 0 hours 15 minutes 7 seconds  Total Procedure Duration: 0 hours 22 minutes 43 seconds  Findings:                 The perianal and digital rectal examinations were                            normal.                           A 6 mm polyp was found in the transverse colon. The                            polyp was sessile. The polyp was removed with a                            cold snare. Resection and retrieval were complete.                           Internal hemorrhoids were found during                            retroflexion. The hemorrhoids were small and Grade  I (internal hemorrhoids that do not prolapse).                           The exam was otherwise without abnormality on                            direct and retroflexion views. Complications:            No immediate complications. Estimated blood loss:                            None. Estimated Blood Loss:     Estimated blood loss: none. Impression:               - One 6 mm polyp in the transverse colon, removed                            with a cold snare. Resected and retrieved.                           - Internal hemorrhoids.                           - The examination was otherwise normal on direct                            and retroflexion views. Recommendation:           - Repeat colonoscopy after studies are complete for                            surveillance based on pathology results vs. no                            repeat  colonoscopy due to age.                           - Patient has a contact number available for                            emergencies. The signs and symptoms of potential                            delayed complications were discussed with the                            patient. Return to normal activities tomorrow.                            Written discharge instructions were provided to the                            patient.                           - Resume previous diet.                           -  Continue present medications.                           - Await pathology results. Ladene Artist, MD 11/26/2021 3:21:47 PM This report has been signed electronically.

## 2021-11-26 NOTE — Progress Notes (Signed)
Called to room to assist during endoscopic procedure.  Patient ID and intended procedure confirmed with present staff. Received instructions for my participation in the procedure from the performing physician.  

## 2021-11-26 NOTE — Progress Notes (Signed)
See 10/29/2021 H&P, no changes

## 2021-11-26 NOTE — Progress Notes (Signed)
To pacu, VSS. Report to Rn.tb 

## 2021-11-26 NOTE — Patient Instructions (Signed)
Handout on polyps, post-dilation diet, hiatal hernia, esophagitis, and hemorrhoids given to patient.  Await pathology results.  Post dilation diet - clear liquid diet for 2 hours until 5:30 pm then advance as tolerated to soft diet the remainder of today and resume previous diet tomorrow  Pick up prescription for Pantoprazole 40 mg daily from pharmacy   GI office appointment in 2 months - to be scheduled  Repeat colonoscopy for surveillance will be determined based off of pathology results vs no repeat due to age    [place]:   Refer to the procedure report that was given to you for any specific questions about what was found during the examination.  If the procedure report does not answer your questions, please call your gastroenterologist to clarify.  If you requested that your care partner not be given the details of your procedure findings, then the procedure report has been included in a sealed envelope for you to review at your convenience later.  YOU SHOULD EXPECT: Some feelings of bloating in the abdomen. Passage of more gas than usual.  Walking can help get rid of the air that was put into your GI tract during the procedure and reduce the bloating. If you had a lower endoscopy (such as a colonoscopy or flexible sigmoidoscopy) you may notice spotting of blood in your stool or on the toilet paper. If you underwent a bowel prep for your procedure, you may not have a normal bowel movement for a few days.  Please Note:  You might notice some irritation and congestion in your nose or some drainage.  This is from the oxygen used during your procedure.  There is no need for concern and it should clear up in a day or so.  SYMPTOMS TO REPORT IMMEDIATELY:  Following lower endoscopy (colonoscopy or flexible sigmoidoscopy):  Excessive amounts of blood in the stool  Significant tenderness or worsening of abdominal pains  Swelling of the  abdomen that is new, acute  Fever of 100F or higher  Following upper endoscopy (EGD)  Vomiting of blood or coffee ground material  New chest pain or pain under the shoulder blades  Painful or persistently difficult swallowing  New shortness of breath  Fever of 100F or higher  Black, tarry-looking stools  For urgent or emergent issues, a gastroenterologist can be reached at any hour by calling [phone]. Do not use MyChart messaging for urgent concerns.    DIET:  We do recommend a small meal at first, but then you may proceed to your regular diet.  Drink plenty of fluids but you should avoid alcoholic beverages for 24 hours.  ACTIVITY:  You should plan to take it easy for the rest of today and you should NOT DRIVE or use heavy machinery until tomorrow (because of the sedation medicines used during the test).    FOLLOW UP: Our staff will call the number listed on your records the next business day following your procedure.  We will call around 7:15- 8:00 am to check on you and address any questions or concerns that you may have regarding the information given to you following your procedure. If we do not reach you, we will leave a message.     If any biopsies were taken you will be contacted by phone or by letter within the next 1-3 weeks.  Please call us at [phone] if you have not heard about the biopsies in 3  weeks.    SIGNATURES/CONFIDENTIALITY: You and/or your care partner have signed paperwork which will be entered into your electronic medical record.  These signatures attest to the fact that that the information above on your After Visit Summary has been reviewed and is understood.  Full responsibility of the confidentiality of this discharge information lies with you and/or your care-partner.

## 2021-11-26 NOTE — Op Note (Signed)
[facility] Patient Name: Patricia Johnston Procedure Date: 11/26/2021 2:34 PM MRN: [id number] Endoscopist: Ladene Artist , MD Age: 71 Referring MD:  Date of Birth: 1950/09/22 Gender: Female Account #: [id number] Procedure:                Upper GI endoscopy Indications:              Dysphagia, Gastroesophageal reflux disease,                            Abnormal esophagram Medicines:                Monitored Anesthesia Care Procedure:                Pre-Anesthesia Assessment:                           - Prior to the procedure, a History and Physical                            was performed, and patient medications and                            allergies were reviewed. The patient's tolerance of                            previous anesthesia was also reviewed. The risks                            and benefits of the procedure and the sedation                            options and risks were discussed with the patient.                            All questions were answered, and informed consent                            was obtained. Prior Anticoagulants: The patient has                            taken no previous anticoagulant or antiplatelet                            agents. ASA Grade Assessment: II - A patient with                            mild systemic disease. After reviewing the risks                            and benefits, the patient was deemed in                            satisfactory condition to undergo the procedure.  After obtaining informed consent, the endoscope was                            passed under direct vision. Throughout the                            procedure, the patient's blood pressure, pulse, and                            oxygen saturations were monitored continuously. The                            GIF [id number] #[id number] was introduced through the                            mouth, and advanced to the second part  of duodenum.                            The upper GI endoscopy was accomplished without                            difficulty. The patient tolerated the procedure                            well. Scope In: Scope Out: Findings:                 LA Grade A (one or more mucosal breaks less than 5                            mm, not extending between tops of 2 mucosal folds)                            esophagitis with no bleeding was found at the                            gastroesophageal junction.                           No endoscopic abnormality was evident in the                            esophagus to explain the patient's complaint of                            dysphagia. It was decided, however, to proceed with                            dilation of the entire esophagus. A guidewire was                            placed and the scope was withdrawn. Dilation was  performed with a Savary dilator with no resistance                            at 16 mm.                           A small hiatal hernia was present.                           The exam of the stomach was otherwise normal.                           The duodenal bulb and second portion of the                            duodenum were normal. Complications:            No immediate complications. Estimated Blood Loss:     Estimated blood loss: none. Impression:               - LA Grade A reflux esophagitis with no bleeding.                           - No endoscopic esophageal abnormality to explain                            patient's dysphagia. Esophagus dilated.                           - Small hiatal hernia.                           - Normal duodenal bulb and second portion of the                            duodenum.                           - No specimens collected. Recommendation:           - Patient has a contact number available for                            emergencies. The signs and symptoms  of potential                            delayed complications were discussed with the                            patient. Return to normal activities tomorrow.                            Written discharge instructions were provided to the                            patient.                           -  Clear liquid diet for 2 hours, then advance as                            tolerated to soft diet today.                           - Resume prior diet tomorrow.                           - Follow antireflux measures.                           - Continue present medications.                           - Pantoprazole 40 mg po qd, 1 year of refills.                           - GI office appt in 2 months. Ladene Artist, MD 11/26/2021 3:26:32 PM This report has been signed electronically.

## 2021-11-27 ENCOUNTER — Telehealth: Payer: Self-pay

## 2021-11-27 NOTE — Telephone Encounter (Signed)
  Follow up Call-     11/26/2021    1:46 PM  Call back number  Post procedure Call Back phone  # [phone]  Permission to leave phone message Yes     Patient questions:  Do you have a fever, pain , or abdominal swelling? No. Pain Score  0 *  Have you tolerated food without any problems? Yes.    Have you been able to return to your normal activities? Yes.    Do you have any questions about your discharge instructions: Diet   Yes.   Medications  Yes.   Follow up visit  Yes.    Do you have questions or concerns about your Care? No.  Actions: * If pain score is 4 or above: No action needed, pain <4.

## 2021-11-27 NOTE — Telephone Encounter (Signed)
Follow up scheduled for 01/27/22 at 2:30 pm with Dr. Fuller Plan. Pt is aware & appointment reminder mailed also.

## 2021-11-28 NOTE — Telephone Encounter (Signed)
Attempted f/u call.

## 2021-12-10 ENCOUNTER — Encounter: Payer: Self-pay | Admitting: Gastroenterology

## 2022-01-27 ENCOUNTER — Encounter: Payer: Self-pay | Admitting: Gastroenterology

## 2022-01-27 ENCOUNTER — Ambulatory Visit (INDEPENDENT_AMBULATORY_CARE_PROVIDER_SITE_OTHER): Payer: No Typology Code available for payment source | Admitting: Gastroenterology

## 2022-01-27 VITALS — BP 90/70 | HR 69 | Ht 65.0 in | Wt 136.1 lb

## 2022-01-27 DIAGNOSIS — K5904 Chronic idiopathic constipation: Secondary | ICD-10-CM | POA: Diagnosis not present

## 2022-01-27 DIAGNOSIS — K2100 Gastro-esophageal reflux disease with esophagitis, without bleeding: Secondary | ICD-10-CM | POA: Diagnosis not present

## 2022-01-27 NOTE — Patient Instructions (Signed)
Stay on pantoprazole daily.   Start over the counter Miralax daily.  The [facility] providers would like to encourage you to use [facility] to communicate with providers for non-urgent requests or questions.  Due to long hold times on the telephone, sending your provider a message by [facility] may be a faster and more efficient way to get a response.  Please allow 48 business hours for a response.  Please remember that this is for non-urgent requests.   Thank you for choosing me and [facility].  Pricilla Riffle. Dagoberto Ligas., MD., Marval Regal

## 2022-01-27 NOTE — Progress Notes (Signed)
    Assessment     GERD with LA Grade A esophagitis Intermittent mild dysphagia likely esophageal dysmotility  Mild constipation, CIC   Recommendations    Follow antireflux measures and continue pantoprazole 40 mg daily Begin MiraLAX 1/2 - 2 caps daily titrated for a complete bowel movement daily REV in 1 year   HPI    This is a 71 year old female returning for follow-up of GERD.  She has mild intermittent solid food dysphagia which has improved but not resolved since her EGD with dilation.  Her reflux symptoms are under very good control with antireflux measures and daily pantoprazole.  She relates problems with mild constipation.  EGD 10/2021 - LA Grade A reflux esophagitis with no bleeding. - No endoscopic esophageal abnormality to explain patient's dysphagia. Esophagus dilated. - Small hiatal hernia. - Normal duodenal bulb and second portion of the duodenum. - No specimens collected.  Colonoscopy 10/2021 - One 6 mm polyp in the transverse colon, removed with a cold snare. Resected and retrieved. (benign mucosa) - Internal hemorrhoids. - The examination was otherwise normal on direct and retroflexion views.   Labs / Imaging       Latest Ref Rng & Units 05/24/2016    5:24 AM  Hepatic Function  Total Protein 6.5 - 8.1 g/dL 6.7   Albumin 3.5 - 5.0 g/dL 3.8   AST 15 - 41 U/L 25   ALT 14 - 54 U/L 20   Alk Phosphatase 38 - 126 U/L 70   Total Bilirubin 0.3 - 1.2 mg/dL 0.7        Latest Ref Rng & Units 04/23/2021   12:19 PM 07/05/2019    8:42 AM 06/25/2018    8:38 PM  CBC  WBC 4.0 - 10.5 K/uL 3.0  3.8  5.6   Hemoglobin 12.0 - 15.0 g/dL 13.4  13.4  13.8   Hematocrit 36.0 - 46.0 % 40.3  40.2  41.2   Platelets 150.0 - 400.0 K/uL 192.0  205  198     Current Medications, Allergies, Past Medical History, Past Surgical History, Family History and Social History were reviewed in [employer] record.   Physical Exam: General: Well developed, well  nourished, no acute distress Head: Normocephalic and atraumatic Eyes: Sclerae anicteric, EOMI Ears: Normal auditory acuity Mouth: Not examined Lungs: Clear throughout to auscultation Heart: Regular rate and rhythm; no murmurs, rubs or bruits Abdomen: Soft, non tender and non distended. No masses, hepatosplenomegaly or hernias noted. Normal Bowel sounds Rectal: Not done Musculoskeletal: Symmetrical with no gross deformities  Pulses:  Normal pulses noted Extremities: No clubbing, cyanosis, edema or deformities noted Neurological: Alert oriented x 4, grossly nonfocal Psychological:  Alert and cooperative. Normal mood and affect   Kloey Cazarez T. Fuller Plan, MD 01/27/2022, 2:40 PM

## 2022-01-29 ENCOUNTER — Ambulatory Visit: Payer: TRICARE For Life (TFL) | Admitting: Student

## 2022-01-29 NOTE — Progress Notes (Signed)
Synopsis: Referred for COPD by Patricia Art, MD  Subjective:   PATIENT ID: Patricia Johnston GENDER: female DOB: 1950-09-22, MRN: [id number]  Chief Complaint  Patient presents with   Follow-up    Review Ct scan.  Report here.  Pt forgot CD at home.   [name] with history of COPD, osteoporosis previously followed by Dr. Dillard Johnston and Dr. Gwenette Johnston. Also noted to have elevated IgE and eosinophilia. Had been under consideration for nucala.  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 It has helped her functional independence, dyspnea. She never got prolonged course of steroids or itraconazole by her report. On stiolto, asmanex with spacer, albuterol prn. She hasn't had bothersome cough in a while.   She had an adverse reaction to fasenra (tried it one month). Irritable, anxious, insomnia. Went back to nucala 08/2020. She thinks she now sees Patricia Johnston at allergy at [facility].   Goes for walks, gardens. Works in her ministry everyday as Restaurant manager, fast food.   Interval HPI:  She had CT Chest at [facility] in 08/2021 with new pulmonary nodules, RML atelectasis but imaging is not available in [place].  Had EGD with dilation 10/2021 with some improvement in her solid food dysphagia.  She has frequent dry cough - occasionally productive of clearish sputum. Short of breath with relatively limited activity - similar to last visit. Using asmanex and stiolto.    Otherwise pertinent review of systems is negative.  Past Medical History:  Diagnosis Date   Anxiety    Asthma    COPD (chronic obstructive pulmonary disease) ([place])    pt states due to [employer] service contracted COPD   Difficult intubation    Pt does not remember details over 30 years ago   Eczema    Esophageal thickening 10/13/2018   on [facility]   GERD (gastroesophageal reflux disease)    Hiatal hernia    Osteoporosis    PONV (postoperative nausea and vomiting)    Pulmonary emphysema (HCC)     Pulmonary nodules    Traumatic arthritis    Umbilical hernia    Wears glasses      Family History  Problem Relation Age of Onset   Heart attack Mother    Colon cancer Neg Hx    Esophageal cancer Neg Hx      Past Surgical History:  Procedure Laterality Date   ABDOMINAL HYSTERECTOMY     bunionectomy     COLONOSCOPY WITH ESOPHAGOGASTRODUODENOSCOPY (EGD)     CYST EXCISION Right 07/05/2019   Procedure: Excision of right ear canal cyst;  Surgeon: Patricia [place], MD;  Location: [facility] OR;  Service: ENT;  Laterality: Right;   dental implant      Social History   Socioeconomic History   Marital status: Single    Spouse name: Not on file   Number of children: Not on file   Years of education: 16   Highest education level: Bachelor's degree (e.g., BA, AB, BS)  Occupational History   Occupation: retired   Occupation: Designer, 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  Tobacco Use   Smoking status: Never   Smokeless tobacco: Never  Scientific laboratory technician Use: Never used  Substance and Sexual Activity   Alcohol use: Never    Comment: ocassionally   Drug use: No   Sexual activity: Not Currently  Other Topics Concern   Not on file  Social History Narrative   Not on file  Social Determinants of Health   Financial Resource Strain: Not on file  Food Insecurity: No Food Insecurity (11/21/2021)   Hunger Vital Sign    Worried About Running Out of Food in the Last Year: Never true    Ran Out of Food in the Last Year: Never true  Transportation Needs: No Transportation Needs (11/21/2021)   PRAPARE - Hydrologist (Medical): No    Lack of Transportation (Non-Medical): No  Physical Activity: Not on file  Stress: Not on file  Social Connections: Not on file  Intimate Partner Violence: Not on file     Allergies  Allergen Reactions   Flonase [Fluticasone Propionate] Other (See Comments)    Nasal bleeding   Nasacort [Triamcinolone] Other (See Comments)     Nasal bleeding     Outpatient Medications Prior to Visit  Medication Sig Dispense Refill   albuterol (PROVENTIL) (2.5 MG/3ML) 0.083% nebulizer solution Take 2.5 mg by nebulization as needed for wheezing or shortness of breath.     albuterol (VENTOLIN HFA) 108 (90 Base) MCG/ACT inhaler Inhale 2 puffs into the lungs every 4 (four) hours as needed for wheezing or shortness of breath. 1 Inhaler 1   alendronate (FOSAMAX) 70 MG tablet Take 70 mg by mouth every Monday. Take with a full glass of water on an empty stomach.     Calcium Carb-Cholecalciferol (CALCIUM 500+D PO) Take 1 tablet by mouth daily.     diphenhydrAMINE (BENADRYL) 25 MG tablet Take 25 mg by mouth every 6 (six) hours as needed (anxiety).     feeding supplement, ENSURE ENLIVE, (ENSURE ENLIVE) LIQD Take 237 mLs by mouth 2 (two) times daily between meals. 237 mL 12   guaiFENesin (MUCINEX) 600 MG 12 hr tablet Take 1 tablet (600 mg total) by mouth 2 (two) times daily. 30 tablet 0   mometasone (ASMANEX) 220 MCG/INH inhaler Inhale 1 puff into the lungs 2 (two) times daily. 1 each 11   Multiple Vitamins-Minerals (MULTIVITAMIN WITH MINERALS) tablet Take 2 tablets by mouth daily.     pantoprazole (PROTONIX) 40 MG tablet Take 1 tablet (40 mg total) by mouth daily. 90 tablet 3   Tiotropium Bromide-Olodaterol 2.5-2.5 MCG/ACT AERS Inhale 2 puffs into the lungs daily. 1 each 11   Facility-Administered Medications Prior to Visit  Medication Dose Route Frequency Provider Last Rate Last Admin   0.9 %  sodium chloride infusion  500 mL Intravenous Once Ladene Artist, MD           Objective:   Physical Exam:  General appearance: 71 y.o., female, NAD, conversant, female, NAD, conversant  Eyes: anicteric sclerae, moist conjunctivae; no lid-lag; PERRL, tracking appropriately HENT: NCAT; oropharynx, MMM, no mucosal ulcerations; normal hard and soft palate Neck: Trachea midline; no lymphadenopathy, no JVD Lungs: CTAB, no crackles, no wheeze, with normal respiratory  effort CV: RRR, no MRGs  Abdomen: Soft, non-tender; non-distended, BS present  Extremities: No peripheral edema, radial and DP pulses present bilaterally  Skin: Normal temperature, turgor and texture; no rash Psych: Appropriate affect Neuro: Alert and oriented to person and place, no focal deficit    Vitals:   01/30/22 1328  BP: 126/64  Pulse: 74  Temp: 97.7 F (36.5 C)  TempSrc: Oral  SpO2: 97%  Weight: 134 lb 6.4 oz (61 kg)  Height: '5\' 5"'$  (1.651 m)      97% on RA BMI Readings from Last 3 Encounters:  01/30/22 22.37 kg/m  01/27/22 22.65 kg/m  11/26/21 21.63 kg/m  Wt Readings from Last 3 Encounters:  01/30/22 134 lb 6.4 oz (61 kg)  01/27/22 136 lb 2 oz (61.7 kg)  11/26/21 134 lb (60.8 kg)     CBC    Component Value Date/Time   WBC 3.0 (L) 04/23/2021 1219   RBC 4.26 04/23/2021 1219   HGB 13.4 04/23/2021 1219   HCT 40.3 04/23/2021 1219   PLT 192.0 04/23/2021 1219   MCV 94.7 04/23/2021 1219   MCH 31.5 07/05/2019 0842   MCHC 33.1 04/23/2021 1219   RDW 13.1 04/23/2021 1219   LYMPHSABS 1.6 04/23/2021 1219   MONOABS 0.3 04/23/2021 1219   EOSABS 0.1 04/23/2021 1219   BASOSABS 0.1 04/23/2021 1219    [facility] [place] [facility] eosinophil count of 790, IgE 2691, IgM 29 mg/DL, normal IgG and IgA.   Chest Imaging: 01/2019 CT Chest with focal bronchial dilatation RUL, micronodular infiltrate  01/2017 CT Chest reviewed by me and remarkable for bronchial wall thickening scattered distal airways mucus plugging, scarring, small nodules  01/14/22 CT Chest report from [place] with resolution RUL nodular opacities but increased TIB micronodularity in apices, interval progression RML atelectasis.   Skin test positivity to aspergillus  Pulmonary Functions Testing Results:     No data to display          FEV1 47% pred 06/2019  PFTs 2017 with ratio 38, FEV1 48% pred with + BD response, hyperinflation, very severe air  trapping  Echocardiogram:   None     Assessment & Plan:    # ACOS # ABPA Symptoms had been under better control since starting nucala. Has not needed steroid burst since last visit with pulmonary. Never did get antifungal or prolonged steroid course prior to initiation of nucala.   # pulmonary nodules # Bronchiolitis # RML atelectasis Consider ABPA, NTM infection, bronchiolitis from asthma.   Plan: - aspergillus specific IgE, IgE, AFB sputum, Sputum culture - will review OSH CT Chest when disc available  - she has been reluctant to trial biologics currently for her ABPA/ACOS. She also doesn't want long course of steroids for ABPA given her OP - consider PFTs after next visit - [place] provider has arranged home sleep study - continue stiolto, asmanex (using with spacer), prn albuterol      Maryjane Hurter, MD [facility] 01/30/2022 7:42 PM

## 2022-01-30 ENCOUNTER — Ambulatory Visit (INDEPENDENT_AMBULATORY_CARE_PROVIDER_SITE_OTHER): Payer: No Typology Code available for payment source | Admitting: Student

## 2022-01-30 ENCOUNTER — Encounter: Payer: Self-pay | Admitting: Student

## 2022-01-30 VITALS — BP 126/64 | HR 74 | Temp 97.7°F | Ht 65.0 in | Wt 134.4 lb

## 2022-01-30 DIAGNOSIS — R918 Other nonspecific abnormal finding of lung field: Secondary | ICD-10-CM

## 2022-01-30 DIAGNOSIS — J4489 Other specified chronic obstructive pulmonary disease: Secondary | ICD-10-CM | POA: Diagnosis not present

## 2022-01-30 DIAGNOSIS — Z23 Encounter for immunization: Secondary | ICD-10-CM | POA: Diagnosis not present

## 2022-01-30 NOTE — Patient Instructions (Addendum)
-   Bloodwork and sputum cultures when it's convenient for you  - I'll take a look at ct chest and let you know how things look - continue stiolto, asmanex, as needed albuterol

## 2022-02-19 DIAGNOSIS — Z1231 Encounter for screening mammogram for malignant neoplasm of breast: Secondary | ICD-10-CM | POA: Diagnosis not present

## 2022-02-19 DIAGNOSIS — R92323 Mammographic fibroglandular density, bilateral breasts: Secondary | ICD-10-CM | POA: Diagnosis not present

## 2022-03-14 DIAGNOSIS — R519 Headache, unspecified: Secondary | ICD-10-CM | POA: Diagnosis not present

## 2022-03-14 DIAGNOSIS — G43109 Migraine with aura, not intractable, without status migrainosus: Secondary | ICD-10-CM | POA: Diagnosis not present

## 2022-03-14 DIAGNOSIS — Z20822 Contact with and (suspected) exposure to covid-19: Secondary | ICD-10-CM | POA: Diagnosis not present

## 2022-07-02 DIAGNOSIS — J309 Allergic rhinitis, unspecified: Secondary | ICD-10-CM | POA: Insufficient documentation

## 2022-07-30 ENCOUNTER — Telehealth: Payer: Self-pay | Admitting: Student

## 2022-07-30 NOTE — Telephone Encounter (Signed)
FYI-sent request to the [facility] to get new authorization- [facility] states [facility] is going to a new vendor, [facility].

## 2022-08-17 IMAGING — DX DG CHEST 2V
2 series · 2 of 2 positions shown · non-contrast
Comparison: CT 05/25/2016, chest x-ray 06/25/2018

CLINICAL DATA: Dyspnea on exertion

EXAM:
CHEST - 2 VIEW

[chest pa]
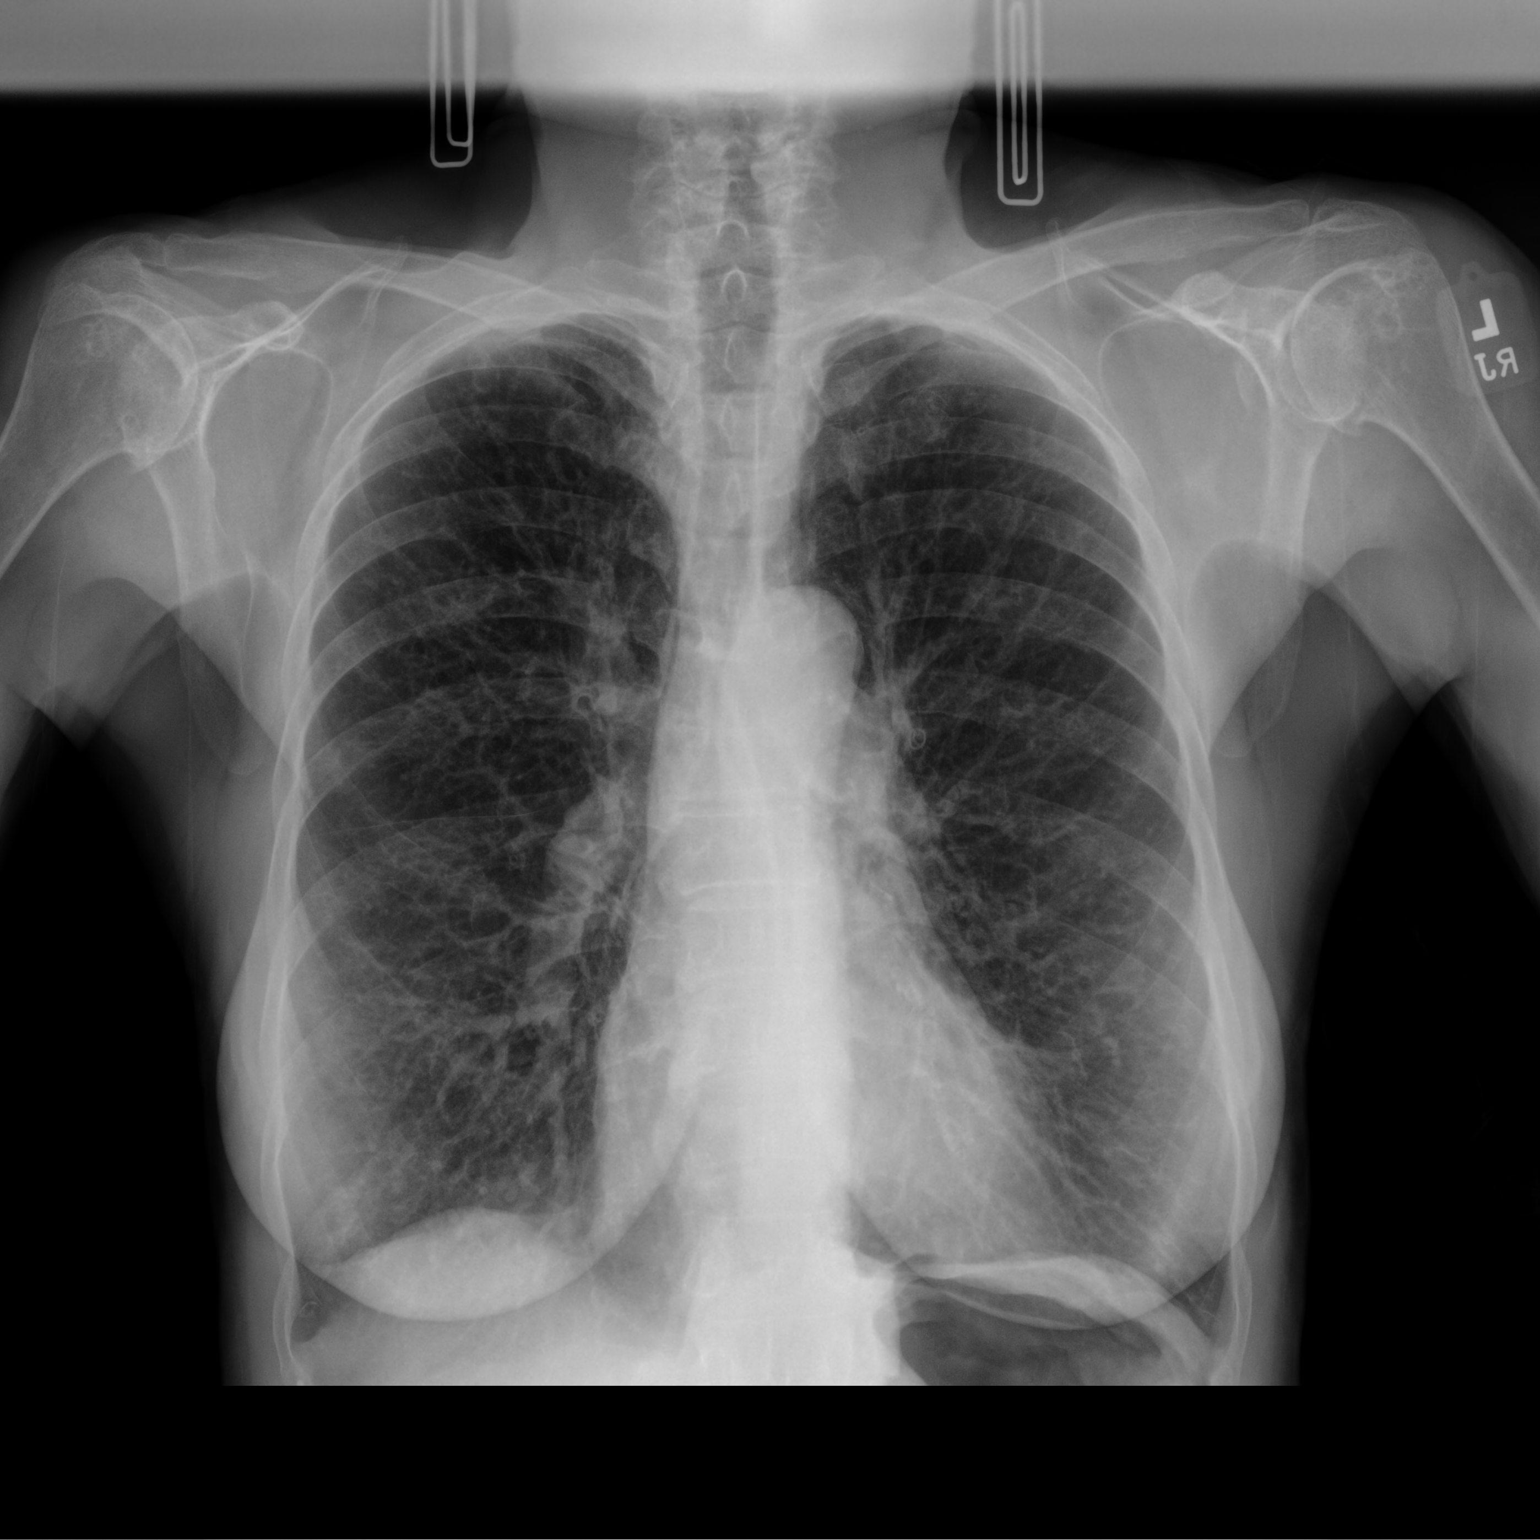

[chest lat]
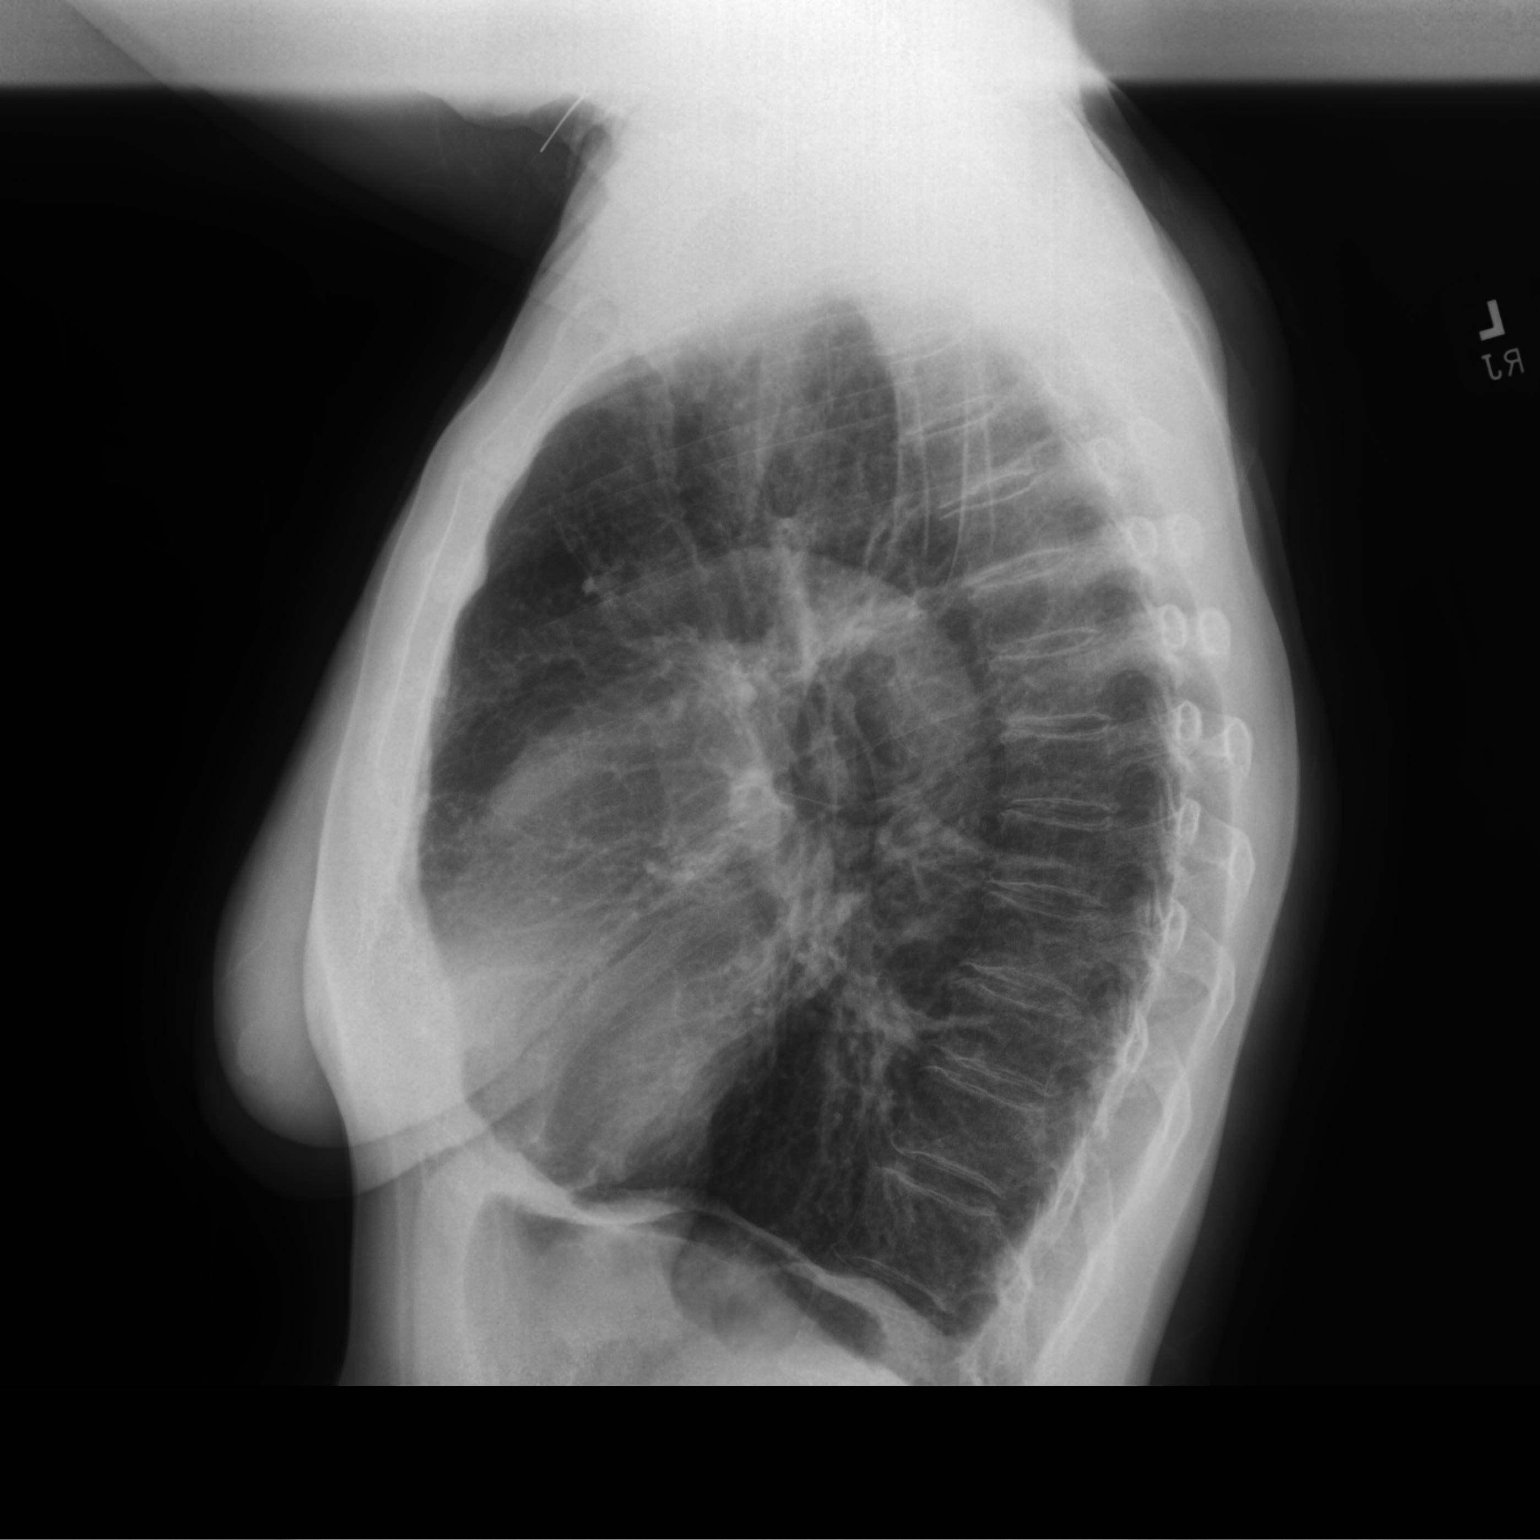

[2 of 2 positions shown; findings below may reference images not displayed]

FINDINGS: Hyperinflation with diffuse bronchitic changes. No focal opacity,
pleural effusion, or pneumothorax. Normal cardiac size. No
pneumothorax.
IMPRESSION: Hyperinflation with emphysematous disease and chronic bronchitic
changes. No acute airspace opacity

## 2022-09-08 ENCOUNTER — Encounter (HOSPITAL_COMMUNITY): Payer: Self-pay

## 2022-09-08 ENCOUNTER — Emergency Department (HOSPITAL_COMMUNITY): Payer: No Typology Code available for payment source

## 2022-09-08 ENCOUNTER — Emergency Department (HOSPITAL_COMMUNITY)
Admission: EM | Admit: 2022-09-08 | Discharge: 2022-09-08 | Disposition: A | Discharge status: Home / Self Care | Payer: No Typology Code available for payment source | Attending: Emergency Medicine | Admitting: Emergency Medicine

## 2022-09-08 DIAGNOSIS — R0689 Other abnormalities of breathing: Secondary | ICD-10-CM | POA: Diagnosis not present

## 2022-09-08 DIAGNOSIS — J441 Chronic obstructive pulmonary disease with (acute) exacerbation: Secondary | ICD-10-CM | POA: Insufficient documentation

## 2022-09-08 DIAGNOSIS — R918 Other nonspecific abnormal finding of lung field: Secondary | ICD-10-CM | POA: Diagnosis not present

## 2022-09-08 DIAGNOSIS — R0902 Hypoxemia: Secondary | ICD-10-CM | POA: Diagnosis not present

## 2022-09-08 DIAGNOSIS — R0602 Shortness of breath: Secondary | ICD-10-CM | POA: Diagnosis not present

## 2022-09-08 DIAGNOSIS — J479 Bronchiectasis, uncomplicated: Secondary | ICD-10-CM | POA: Diagnosis not present

## 2022-09-08 LAB — CBC
HCT: 43.8 % (ref 36.0–46.0)
Hemoglobin: 14.2 g/dL (ref 12.0–15.0)
MCH: 30.8 pg (ref 26.0–34.0)
MCHC: 32.4 g/dL (ref 30.0–36.0)
MCV: 95 fL (ref 80.0–100.0)
Platelets: 194 10*3/uL (ref 150–400)
RBC: 4.61 MIL/uL (ref 3.87–5.11)
RDW: 12.6 % (ref 11.5–15.5)
WBC: 4.2 10*3/uL (ref 4.0–10.5)
nRBC: 0 % (ref 0.0–0.2)

## 2022-09-08 LAB — BASIC METABOLIC PANEL
Anion gap: 9 (ref 5–15)
BUN: 8 mg/dL (ref 8–23)
CO2: 27 mmol/L (ref 22–32)
Calcium: 9 mg/dL (ref 8.9–10.3)
Chloride: 106 mmol/L (ref 98–111)
Creatinine, Ser: 0.88 mg/dL (ref 0.44–1.00)
GFR, Estimated: >60 mL/min (ref >60)
Glucose, Bld: 107 mg/dL — ABNORMAL HIGH (ref 70–99)
Potassium: 3.4 mmol/L — ABNORMAL LOW (ref 3.5–5.1)
Sodium: 142 mmol/L (ref 135–145)

## 2022-09-08 LAB — TROPONIN I (HIGH SENSITIVITY)
Troponin I (High Sensitivity): 4 ng/L (ref <18)
Troponin I (High Sensitivity): 12 ng/L (ref <18)

## 2022-09-08 MED ORDER — PREDNISONE 20 MG PO TABS: ORAL_TABLET | ORAL | 0 refills | Status: AC

## 2022-09-08 MED ORDER — ALBUTEROL SULFATE (2.5 MG/3ML) 0.083% IN NEBU
5.0000 mg | INHALATION_SOLUTION | Freq: Once | RESPIRATORY_TRACT | Status: AC
  Administered 2022-09-08: 5 mg via RESPIRATORY_TRACT
  Filled 2022-09-08: qty 6

## 2022-09-08 NOTE — ED Provider Notes (Signed)
[place] EMERGENCY DEPARTMENT AT [facility] Provider Note   CSN: [id number] Arrival date & time: 09/08/22  5956     History  Chief Complaint  Patient presents with   Shortness of Breath    Patricia Johnston is a 72 y.o. female.  Patient is a 72 year old female with a history of COPD who presents in respiratory distress.  She was brought by EMS.  She states that she has had some increased shortness of breath over the last couple days.  She is also had a cough which is mostly nonproductive.  She denies any leg swelling.  She has some tightness across her chest.  She denies any known fevers.  No vomiting.  She was given albuterol, Atrovent and 125 mg of Solu-Medrol prior to arrival by EMS.  She is not normally on home oxygen.       Home Medications Prior to Admission medications   Medication Sig Start Date End Date Taking? Authorizing Provider  predniSONE (DELTASONE) 20 MG tablet 2 tabs po daily x 4 days 09/08/22  Yes Rolan Bucco, MD  albuterol (PROVENTIL) (2.5 MG/3ML) 0.083% nebulizer solution Take 2.5 mg by nebulization as needed for wheezing or shortness of breath.    [provider]  albuterol (VENTOLIN HFA) 108 (90 Base) MCG/ACT inhaler Inhale 2 puffs into the lungs every 4 (four) hours as needed for wheezing or shortness of breath. 06/25/18   Cathren Laine, MD  alendronate (FOSAMAX) 70 MG tablet Take 70 mg by mouth every Monday. Take with a full glass of water on an empty stomach.    [provider]  Calcium Carb-Cholecalciferol (CALCIUM 500+D PO) Take 1 tablet by mouth daily.    [provider]  diphenhydrAMINE (BENADRYL) 25 MG tablet Take 25 mg by mouth every 6 (six) hours as needed (anxiety).    [provider]  feeding supplement, ENSURE ENLIVE, (ENSURE ENLIVE) LIQD Take 237 mLs by mouth 2 (two) times daily between meals. 05/26/16   Albertine Grates, MD  guaiFENesin (MUCINEX) 600 MG 12 hr tablet Take 1 tablet (600 mg total) by mouth 2  (two) times daily. 05/26/16   Albertine Grates, MD  mometasone [facility]) 220 MCG/INH inhaler Inhale 1 puff into the lungs 2 (two) times daily. 11/15/20   Omar Person, MD  Multiple Vitamins-Minerals (MULTIVITAMIN WITH MINERALS) tablet Take 2 tablets by mouth daily.    [provider]  pantoprazole (PROTONIX) 40 MG tablet Take 1 tablet (40 mg total) by mouth daily. 11/26/21   Meryl Dare, MD  Tiotropium Bromide-Olodaterol 2.5-2.5 MCG/ACT AERS Inhale 2 puffs into the lungs daily. 11/15/20   Omar Person, MD      Allergies    Flonase [fluticasone propionate] and Nasacort [triamcinolone]    Review of Systems   Review of Systems  Constitutional:  Positive for fatigue. Negative for chills, diaphoresis and fever.  HENT:  Negative for congestion, rhinorrhea and sneezing.   Eyes: Negative.   Respiratory:  Positive for cough, chest tightness and shortness of breath.   Cardiovascular:  Negative for leg swelling.  Gastrointestinal:  Negative for abdominal pain, blood in stool, diarrhea, nausea and vomiting.  Genitourinary:  Negative for difficulty urinating, flank pain, frequency and hematuria.  Musculoskeletal:  Negative for arthralgias and back pain.  Skin:  Negative for rash.  Neurological:  Negative for dizziness, speech difficulty, weakness, numbness and headaches.    Physical Exam Updated Vital Signs BP 126/81   Pulse 87   Temp 98 F (36.7  C) (Oral)   Resp 18   SpO2 97%  Physical Exam Constitutional:      General: She is in acute distress.     Appearance: She is well-developed.  HENT:     Head: Normocephalic and atraumatic.  Eyes:     Pupils: Pupils are equal, round, and reactive to light.  Cardiovascular:     Rate and Rhythm: Normal rate and regular rhythm.     Heart sounds: Normal heart sounds.  Pulmonary:     Effort: Tachypnea and accessory muscle usage present. No respiratory distress.     Breath sounds: Decreased breath sounds and wheezing present. No rales.   Chest:     Chest wall: No tenderness.  Abdominal:     General: Bowel sounds are normal.     Palpations: Abdomen is soft.     Tenderness: There is no abdominal tenderness. There is no guarding or rebound.  Musculoskeletal:        General: Normal range of motion.     Cervical back: Normal range of motion and neck supple.     Comments: Trace edema to lower extremities bilaterally  Lymphadenopathy:     Cervical: No cervical adenopathy.  Skin:    General: Skin is warm and dry.     Findings: No rash.  Neurological:     Mental Status: She is alert and oriented to person, place, and time.     ED Results / Procedures / Treatments   Labs (all labs ordered are listed, but only abnormal results are displayed) Labs Reviewed  BASIC METABOLIC PANEL - Abnormal; Notable for the following components:      Result Value   Potassium 3.4 (*)    Glucose, Bld 107 (*)    All other components within normal limits  CBC  TROPONIN I (HIGH SENSITIVITY)  TROPONIN I (HIGH SENSITIVITY)    EKG EKG Interpretation Date/Time:  Tuesday September 08 2022 09:18:48 EDT Ventricular Rate:  85 PR Interval:  160 QRS Duration:  87 QT Interval:  383 QTC Calculation: 456 R Axis:   68  Text Interpretation: Sinus rhythm Consider left ventricular hypertrophy since last tracing no significant change Confirmed by Rolan Bucco [phone]) on 09/08/2022 10:44:22 AM  Radiology DG Chest Port 1 View  Result Date: 09/08/2022 CLINICAL DATA:  Shortness of breath.  History of COPD. EXAM: PORTABLE CHEST 1 VIEW COMPARISON:  Chest radiographs 04/23/2021 and 06/25/2018; CT chest 05/25/2016 FINDINGS: Cardiac silhouette and mediastinal contours are within normal limits for AP technique. There is flattening of the diaphragms and moderate hyperinflation. Bilateral upper lung mild bronchial wall thickening and bronchiectasis, unchanged. No acute airspace opacity. No pleural effusion or pneumothorax. No acute skeletal abnormality. 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 IMPRESSION: 1. No acute cardiopulmonary disease process. 2. Moderate hyperinflation consistent with chronic emphysema. 3. Chronic bilateral upper lung mild bronchial wall thickening and bronchiectasis, unchanged. Electronically Signed   By: Neita Garnet M.D.   On: 09/08/2022 10:08    Procedures Procedures    Medications Ordered in ED Medications  albuterol (PROVENTIL) (2.5 MG/3ML) 0.083% nebulizer solution 5 mg (5 mg Nebulization Given 09/08/22 1024)    ED Course/ Medical Decision Making/ A&P                             Medical Decision Making Amount and/or Complexity of Data Reviewed Labs: ordered. Radiology: ordered.  Risk Prescription drug management.   Patient is a 72 year old who  presents with a COPD exacerbation.  She does not have any pleuritic pain, leg swelling or other symptoms that we were concerning for PE.  EKG does not show any ischemic changes.  Her troponins have been negative.  No other concerns for ACS.  Chest x-ray was performed which was interpreted by me and confirmed by the radiologist to show no evidence of pneumonia.  She had been given some nebulizer treatments and Solu-Medrol by EMS.  She was given an additional nebulizer treatment here.  She has had significant improvement after this.  She has no ongoing increased work of breathing.  Her lungs have only trace wheezing on repeat exam.  She is maintaining normal oxygen saturations on room air.  She feels better and ready to go home.  She was discharged home in good condition.  She was given prescription for 4-day course of prednisone.  She actually has an appointment to follow-up with her pulmonologist tomorrow.  She does not have fever, ongoing sputum production or other suggestions of infection to warrant antibiotic treatment.  Return precautions were given.  Final Clinical Impression(s) / ED Diagnoses Final diagnoses:  COPD exacerbation (HCC)    Rx / DC Orders ED  Discharge Orders          Ordered    predniSONE (DELTASONE) 20 MG tablet        09/08/22 1322              Rolan Bucco, MD 09/08/22 1324

## 2022-09-08 NOTE — ED Notes (Signed)
Patient called friend to pick her up, she was told to wait in ED main entrance for friend arrival.

## 2022-09-08 NOTE — Discharge Instructions (Addendum)
Follow-up with your pulmonologist tomorrow at your scheduled appointment.  Continue using your albuterol either inhaler or nebulizer every 4-6 hours as needed.  Return to the emergency room if you have any worsening symptoms.

## 2022-09-08 NOTE — ED Triage Notes (Signed)
Pt arrived via EMS, from home. Hx of COPD. States normally COPD exacerbation triggered by worsening humidity and was outside for some time yesterday.   20 G R AC Given: 12 mg albuterol 1 mg atrovent 125 mg solumedrol

## 2022-09-15 ENCOUNTER — Telehealth: Payer: Self-pay

## 2022-09-15 NOTE — Telephone Encounter (Signed)
Transition Care Management Unsuccessful Follow-up Telephone Call  Date of discharge and from where:  [facility] 7/9  Attempts:  2nd Attempt  Reason for unsuccessful TCM follow-up call:  No answer/busy   Lenard Forth [facility], [facility] [phone] [address] [place], [place], [place] [address] Phone: [phone] Email: [email or web address]

## 2022-09-15 NOTE — Telephone Encounter (Signed)
Transition Care Management Unsuccessful Follow-up Telephone Call  Date of discharge and from where:  [facility] 7/9  Attempts:  1st Attempt  Reason for unsuccessful TCM follow-up call:  No answer/busy   Lenard Forth [facility] Guide, [facility] [phone] [address] [place], [place], [place] [address] Phone: [phone] Email: [email or web address]

## 2022-12-30 ENCOUNTER — Telehealth (HOSPITAL_COMMUNITY): Payer: Self-pay

## 2022-12-30 NOTE — Telephone Encounter (Signed)
Called patient to see if she is interested in the Pulmonary Rehab Program. Patient expressed interest. Explained scheduling process and went over insurance, patient verbalized understanding. Will pass to RN for review.

## 2022-12-30 NOTE — Telephone Encounter (Signed)
[place] the [place] [id number].

## 2023-01-04 ENCOUNTER — Telehealth (HOSPITAL_COMMUNITY): Payer: Self-pay

## 2023-01-04 ENCOUNTER — Encounter (HOSPITAL_COMMUNITY): Payer: Self-pay

## 2023-01-04 NOTE — Telephone Encounter (Signed)
Attempted to call patient in regards to Pulmonary Rehab - unable to leave VM.  Mailed letter 

## 2023-01-07 ENCOUNTER — Telehealth (HOSPITAL_COMMUNITY): Payer: Self-pay

## 2023-01-07 NOTE — Telephone Encounter (Signed)
Pt returned phone call and wanted to schedule for pulmonary rehab. Pt will come in for orientation on 11/8@8am  and will attend the 1:15 exercise class time.   Gave pt the information about the program.

## 2023-01-07 NOTE — Telephone Encounter (Signed)
Called to confirm appt. Pt confirmed appt. Instructed pt on proper footwear. Gave directions along with department number.

## 2023-01-08 ENCOUNTER — Encounter (HOSPITAL_COMMUNITY)
Admission: RE | Admit: 2023-01-08 | Discharge: 2023-01-08 | Disposition: A | Discharge status: Home / Self Care | Payer: Medicare Other | Source: Ambulatory Visit | Attending: Pulmonary Disease | Admitting: Pulmonary Disease

## 2023-01-08 ENCOUNTER — Encounter (HOSPITAL_COMMUNITY): Payer: Self-pay

## 2023-01-08 VITALS — BP 112/68 | HR 67 | Wt 129.4 lb

## 2023-01-08 DIAGNOSIS — J449 Chronic obstructive pulmonary disease, unspecified: Secondary | ICD-10-CM | POA: Insufficient documentation

## 2023-01-08 DIAGNOSIS — J329 Chronic sinusitis, unspecified: Secondary | ICD-10-CM | POA: Insufficient documentation

## 2023-01-08 DIAGNOSIS — M47812 Spondylosis without myelopathy or radiculopathy, cervical region: Secondary | ICD-10-CM | POA: Insufficient documentation

## 2023-01-08 DIAGNOSIS — M1990 Unspecified osteoarthritis, unspecified site: Secondary | ICD-10-CM | POA: Insufficient documentation

## 2023-01-08 DIAGNOSIS — J4541 Moderate persistent asthma with (acute) exacerbation: Secondary | ICD-10-CM | POA: Insufficient documentation

## 2023-01-08 DIAGNOSIS — D804 Selective deficiency of immunoglobulin M [IgM]: Secondary | ICD-10-CM | POA: Insufficient documentation

## 2023-01-08 DIAGNOSIS — K219 Gastro-esophageal reflux disease without esophagitis: Secondary | ICD-10-CM | POA: Insufficient documentation

## 2023-01-08 DIAGNOSIS — J209 Acute bronchitis, unspecified: Secondary | ICD-10-CM | POA: Insufficient documentation

## 2023-01-08 DIAGNOSIS — G5600 Carpal tunnel syndrome, unspecified upper limb: Secondary | ICD-10-CM | POA: Insufficient documentation

## 2023-01-08 DIAGNOSIS — J4550 Severe persistent asthma, uncomplicated: Secondary | ICD-10-CM | POA: Insufficient documentation

## 2023-01-08 DIAGNOSIS — M818 Other osteoporosis without current pathological fracture: Secondary | ICD-10-CM | POA: Insufficient documentation

## 2023-01-08 NOTE — Progress Notes (Signed)
Pulmonary Rehab Orientation Physical Assessment Note  Physical assessment reveals patient is alert and oriented x 4.  Heart rate is normal, breath sounds clear to auscultation, no wheezes, rales, or rhonchi. Pt denies chronic cough. Bowel sounds present x4 quads.  Pt denies abdominal discomfort, nausea, vomiting, diarrhea or constipation. Grip strength equal, strong. Distal pulses +2; no swelling to lower extremities.   Essie Hart, RN, BSN

## 2023-01-08 NOTE — Progress Notes (Signed)
Patricia Johnston 72 y.o. female  Initial Psychosocial Assessment  Pt psychosocial assessment reveals pt lives alone. Pt is currently retired. Pt hobbies include reading and gardening. Pt reports her stress level is low. Areas of stress/anxiety include health.  Pt does not exhibit signs of depression. Pt shows good  coping skills with positive outlook . Offered emotional support and reassurance. Monitor and evaluate progress toward psychosocial goal(s).  Goal(s): Improved management of stress Improved coping skills Help patient work toward returning to meaningful activities that improve patient's QOL and are attainable with patient's lung disease   01/08/2023 9:14 AM

## 2023-01-08 NOTE — Progress Notes (Signed)
Pulmonary Individual Treatment Plan  Patient Details  Name: Patricia Johnston MRN: [id number] Date of Birth: Jul 28, 1950 Referring Provider:   [name] Pulmonary Rehab Walk Test from 01/08/2023 in [facility]  Referring Provider Bina  [Ellison]       Initial Encounter Date:  Flowsheet Row Pulmonary Rehab Walk Test from 01/08/2023 in [facility]  Date 01/08/23       Visit Diagnosis: Severe persistent asthma, unspecified whether complicated  Patient's Home Medications on Admission:   Current Outpatient Medications:    albuterol (PROVENTIL) (2.5 MG/3ML) 0.083% nebulizer solution, Take 2.5 mg by nebulization as needed for wheezing or shortness of breath., Disp: , Rfl:    albuterol (VENTOLIN HFA) 108 (90 Base) MCG/ACT inhaler, Inhale 2 puffs into the lungs every 4 (four) hours as needed for wheezing or shortness of breath., Disp: 1 Inhaler, Rfl: 1   alendronate (FOSAMAX) 70 MG tablet, Take 70 mg by mouth every Monday. Take with a full glass of water on an empty stomach., Disp: , Rfl:    Calcium Carb-Cholecalciferol (CALCIUM 500+D PO), Take 1 tablet by mouth daily., Disp: , Rfl:    diphenhydrAMINE (BENADRYL) 25 MG tablet, Take 25 mg by mouth every 6 (six) hours as needed (anxiety)., Disp: , Rfl:    feeding supplement, ENSURE ENLIVE, (ENSURE ENLIVE) LIQD, Take 237 mLs by mouth 2 (two) times daily between meals., Disp: 237 mL, Rfl: 12   guaiFENesin (MUCINEX) 600 MG 12 hr tablet, Take 1 tablet (600 mg total) by mouth 2 (two) times daily., Disp: 30 tablet, Rfl: 0   mometasone (ASMANEX) 220 MCG/INH inhaler, Inhale 1 puff into the lungs 2 (two) times daily., Disp: 1 each, Rfl: 11   Multiple Vitamins-Minerals (MULTIVITAMIN WITH MINERALS) tablet, Take 2 tablets by mouth daily., Disp: , Rfl:    pantoprazole (PROTONIX) 40 MG tablet, Take 1 tablet (40 mg total) by mouth daily., Disp:  90 tablet, Rfl: 3   predniSONE (DELTASONE) 20 MG tablet, 2 tabs po daily x 4 days, Disp: 8 tablet, Rfl: 0   sodium chloride HYPERTONIC 3 % nebulizer solution, INHALE 1 VIAL BY VIA NEBULIZER TWICE A DAY, Disp: , Rfl:    Tiotropium Bromide-Olodaterol 2.5-2.5 MCG/ACT AERS, Inhale 2 puffs into the lungs daily., Disp: 1 each, Rfl: 11  Current Facility-Administered Medications:    0.9 %  sodium chloride infusion, 500 mL, Intravenous, Once, Meryl Dare, MD  Past Medical History: Past Medical History:  Diagnosis Date   Anxiety    Asthma    COPD (chronic obstructive pulmonary disease) (HCC)    pt states due to [employer] service contracted COPD   Difficult intubation    Pt does not remember details over 30 years ago   Eczema    Esophageal thickening 10/13/2018   on CT- [facility]   GERD (gastroesophageal reflux disease)    Hiatal hernia    Osteoporosis    PONV (postoperative nausea and vomiting)    Pulmonary emphysema (HCC)    Pulmonary nodules    Traumatic arthritis    Umbilical hernia    Wears glasses     Tobacco Use: Social History   Tobacco Use  Smoking Status Never  Smokeless Tobacco Never    Labs: Review Flowsheet        No data to display          Capillary Blood Glucose: Lab Results  Component Value  Date   GLUCAP 99 06/16/2017     Pulmonary Assessment Scores:  Pulmonary Assessment Scores     Row Name 01/08/23 0853         ADL UCSD   ADL Phase Entry     SOB Score total 39       CAT Score   CAT Score 20       mMRC Score   mMRC Score 2             UCSD: Self-administered rating of dyspnea associated with activities of daily living (ADLs) 6-point scale (0 = "not at all" to 5 = "maximal or unable to do because of breathlessness")  Scoring Scores range from 0 to 120.  Minimally important difference is 5 units  CAT: CAT can identify the health impairment of COPD patients and is better correlated with disease  progression.  CAT has a scoring range of zero to 40. The CAT score is classified into four groups of low (less than 10), medium (10 - 20), high (21-30) and very high (31-40) based on the impact level of disease on health status. A CAT score over 10 suggests significant symptoms.  A worsening CAT score could be explained by an exacerbation, poor medication adherence, poor inhaler technique, or progression of COPD or comorbid conditions.  CAT MCID is 2 points  mMRC: mMRC (Modified Medical Research Council) Dyspnea Scale is used to assess the degree of baseline functional disability in patients of respiratory disease due to dyspnea. No minimal important difference is established. A decrease in score of 1 point or greater is considered a positive change.   Pulmonary Function Assessment:  Pulmonary Function Assessment - 01/08/23 0838       Breath   Bilateral Breath Sounds Clear;Decreased    Shortness of Breath Yes;Panic with Shortness of Breath             Exercise Target Goals: Exercise Program Goal: Individual exercise prescription set using results from initial 6 min walk test and THRR while considering  patient's activity barriers and safety.   Exercise Prescription Goal: Initial exercise prescription builds to 30-45 minutes a day of aerobic activity, 2-3 days per week.  Home exercise guidelines will be given to patient during program as part of exercise prescription that the participant will acknowledge.  Activity Barriers & Risk Stratification:  Activity Barriers & Cardiac Risk Stratification - 01/08/23 0837       Activity Barriers & Cardiac Risk Stratification   Activity Barriers Deconditioning;Muscular Weakness;Shortness of Breath    Cardiac Risk Stratification Low             6 Minute Walk:  6 Minute Walk     Row Name 01/08/23 0918         6 Minute Walk   Phase Initial     Distance 1202 feet     Walk Time 6 minutes     # of Rest Breaks 0     MPH 2.28      METS 2.69     RPE 7     Perceived Dyspnea  0.5     VO2 Peak 9.43     Symptoms No     Resting HR 64 bpm     Resting BP 112/68     Resting Oxygen Saturation  94 %     Exercise Oxygen Saturation  during 6 min walk 91 %     Max Ex. HR 85 bpm     Max Ex. BP  120/66     2 Minute Post BP 108/62       Interval HR   1 Minute HR 80     2 Minute HR 83     3 Minute HR 83     4 Minute HR 85     5 Minute HR 85     6 Minute HR 83     2 Minute Post HR 66     Interval Heart Rate? Yes       Interval Oxygen   Interval Oxygen? Yes     Baseline Oxygen Saturation % 94 %     1 Minute Oxygen Saturation % 93 %     1 Minute Liters of Oxygen 0 L     2 Minute Oxygen Saturation % 92 %     2 Minute Liters of Oxygen 0 L     3 Minute Oxygen Saturation % 91 %     3 Minute Liters of Oxygen 0 L     4 Minute Oxygen Saturation % 92 %     4 Minute Liters of Oxygen 0 L     5 Minute Oxygen Saturation % 93 %     5 Minute Liters of Oxygen 0 L     6 Minute Oxygen Saturation % 93 %     6 Minute Liters of Oxygen 0 L     2 Minute Post Oxygen Saturation % 94 %     2 Minute Post Liters of Oxygen 0 L              Oxygen Initial Assessment:  Oxygen Initial Assessment - 01/08/23 0838       Home Oxygen   Home Oxygen Device None    Sleep Oxygen Prescription None    Home Exercise Oxygen Prescription None    Home Resting Oxygen Prescription None      Initial 6 min Walk   Oxygen Used None      Program Oxygen Prescription   Program Oxygen Prescription None      Intervention   Short Term Goals To learn and understand importance of monitoring SPO2 with pulse oximeter and demonstrate accurate use of the pulse oximeter.;To learn and understand importance of maintaining oxygen saturations>88%;To learn and demonstrate proper pursed lip breathing techniques or other breathing techniques. ;To learn and demonstrate proper use of respiratory medications    Long  Term Goals Maintenance of O2 saturations>88%;Compliance  with respiratory medication;Verbalizes importance of monitoring SPO2 with pulse oximeter and return demonstration;Exhibits proper breathing techniques, such as pursed lip breathing or other method taught during program session;Demonstrates proper use of MDI's             Oxygen Re-Evaluation:   Oxygen Discharge (Final Oxygen Re-Evaluation):   Initial Exercise Prescription:  Initial Exercise Prescription - 01/08/23 0900       Date of Initial Exercise RX and Referring Provider   Date 01/08/23    Referring Provider [name]   Expected Discharge Date 04/08/23      Treadmill   MPH 2    Grade 2    Minutes 15    METs 3.6      Recumbant Elliptical   Level 2    RPM 60    Watts 50    Minutes 15    METs 2.5      Prescription Details   Frequency (times per week) 2    Duration Progress to 30 minutes of continuous aerobic without signs/symptoms  of physical distress      Intensity   THRR 40-80% of Max Heartrate 59-118    Ratings of Perceived Exertion 11-13    Perceived Dyspnea 0-4      Progression   Progression Continue progressive overload as per policy without signs/symptoms or physical distress.      Resistance Training   Training Prescription Yes    Weight blue bands    Reps 10-15             Perform Capillary Blood Glucose checks as needed.  Exercise Prescription Changes:   Exercise Comments:   Exercise Goals and Review:   Exercise Goals     Row Name 01/08/23 0837             Exercise Goals   Increase Physical Activity Yes       Intervention Provide advice, education, support and counseling about physical activity/exercise needs.;Develop an individualized exercise prescription for aerobic and resistive training based on initial evaluation findings, risk stratification, comorbidities and participant's personal goals.       Expected Outcomes Short Term: Attend rehab on a regular basis to increase amount of physical activity.;Long Term: Add in  home exercise to make exercise part of routine and to increase amount of physical activity.;Long Term: Exercising regularly at least 3-5 days a week.       Increase Strength and Stamina Yes       Intervention Provide advice, education, support and counseling about physical activity/exercise needs.;Develop an individualized exercise prescription for aerobic and resistive training based on initial evaluation findings, risk stratification, comorbidities and participant's personal goals.       Expected Outcomes Short Term: Increase workloads from initial exercise prescription for resistance, speed, and METs.;Short Term: Perform resistance training exercises routinely during rehab and add in resistance training at home;Long Term: Improve cardiorespiratory fitness, muscular endurance and strength as measured by increased METs and functional capacity ( )       Able to understand and use rate of perceived exertion (RPE) scale Yes       Intervention Provide education and explanation on how to use RPE scale       Expected Outcomes Short Term: Able to use RPE daily in rehab to express subjective intensity level;Long Term:  Able to use RPE to guide intensity level when exercising independently       Able to understand and use Dyspnea scale Yes       Intervention Provide education and explanation on how to use Dyspnea scale       Expected Outcomes Short Term: Able to use Dyspnea scale daily in rehab to express subjective sense of shortness of breath during exertion;Long Term: Able to use Dyspnea scale to guide intensity level when exercising independently       Knowledge and understanding of Target Heart Rate Range (THRR) Yes       Intervention Provide education and explanation of THRR including how the numbers were predicted and where they are located for reference       Expected Outcomes Short Term: Able to state/look up THRR;Long Term: Able to use THRR to govern intensity when exercising independently;Short  Term: Able to use daily as guideline for intensity in rehab       Understanding of Exercise Prescription Yes       Intervention Provide education, explanation, and written materials on patient's individual exercise prescription       Expected Outcomes Short Term: Able to explain program exercise prescription  Exercise Goals Re-Evaluation :   Discharge Exercise Prescription (Final Exercise Prescription Changes):   Nutrition:  Target Goals: Understanding of nutrition guidelines, daily intake of sodium 1500mg , cholesterol 200mg , calories 30% from fat and 7% or less from saturated fats, daily to have 5 or more servings of fruits and vegetables.  Biometrics:    Nutrition Therapy Plan and Nutrition Goals:   Nutrition Assessments:  MEDIFICTS Score Key: >=70 Need to make dietary changes  40-70 Heart Healthy Diet <= 40 Therapeutic Level Cholesterol Diet   Picture Your Plate Scores: <16 Unhealthy dietary pattern with much room for improvement. 41-50 Dietary pattern unlikely to meet recommendations for good health and room for improvement. 51-60 More healthful dietary pattern, with some room for improvement.  >60 Healthy dietary pattern, although there may be some specific behaviors that could be improved.    Nutrition Goals Re-Evaluation:   Nutrition Goals Discharge (Final Nutrition Goals Re-Evaluation):   Psychosocial: Target Goals: Acknowledge presence or absence of significant depression and/or stress, maximize coping skills, provide positive support system. Participant is able to verbalize types and ability to use techniques and skills needed for reducing stress and depression.  Initial Review & Psychosocial Screening:  Initial Psych Review & Screening - 01/08/23 0832       Initial Review   Current issues with None Identified      Family Dynamics   Good Support System? Yes    Comments Pt has good support through her family and church.       Barriers   Psychosocial barriers to participate in program There are no identifiable barriers or psychosocial needs.      Screening Interventions   Interventions Encouraged to exercise             Quality of Life Scores:  Scores of 19 and below usually indicate a poorer quality of life in these areas.  A difference of  2-3 points is a clinically meaningful difference.  A difference of 2-3 points in the total score of the Quality of Life Index has been associated with significant improvement in overall quality of life, self-image, physical symptoms, and general health in studies assessing change in quality of life.  PHQ-9: Review Flowsheet  More data may exist      01/08/2023 11/21/2021 05/29/2021 03/26/2021 09/27/2015  Depression screen PHQ 2/9  Decreased Interest 0 0 0 0 0 0  Down, Depressed, Hopeless 0 0 0 0 0 0  PHQ - 2 Score 0 0 0 0 0 0  Altered sleeping 0 - 0 0 -  Tired, decreased energy 0 - 0 1 -  Change in appetite 0 - 0 0 -  Feeling bad or failure about yourself  0 - 0 0 -  Trouble concentrating 0 - 0 0 -  Moving slowly or fidgety/restless 0 - 0 0 -  Suicidal thoughts 0 - 0 0 -  PHQ-9 Score 0 - 0 1 -  Difficult doing work/chores Not difficult at all - Not difficult at all Not difficult at all -    Details       Multiple values from one day are sorted in reverse-chronological order        Interpretation of Total Score  Total Score Depression Severity:  1-4 = Minimal depression, 5-9 = Mild depression, 10-14 = Moderate depression, 15-19 = Moderately severe depression, 20-27 = Severe depression   Psychosocial Evaluation and Intervention:  Psychosocial Evaluation - 01/08/23 0834       Psychosocial Evaluation & Interventions  Interventions Encouraged to exercise with the program and follow exercise prescription    Comments Patricia Johnston has no psychosocial concerns or barriers identified    Expected Outcomes For her to be able to participate in the PR program with no  psychosocial  issues or concerns    Continue Psychosocial Services  No Follow up required             Psychosocial Re-Evaluation:   Psychosocial Discharge (Final Psychosocial Re-Evaluation):   Education: Education Goals: Education classes will be provided on a weekly basis, covering required topics. Participant will state understanding/return demonstration of topics presented.  Learning Barriers/Preferences:  Learning Barriers/Preferences - 01/08/23 0835       Learning Barriers/Preferences   Learning Barriers Sight;Cultural/Spiritual   wears glasses, pt is Jehovah witness   Learning Preferences Skilled Demonstration;Written Material;Group Instruction;Individual Instruction             Education Topics: Know Your Numbers Group instruction that is supported by a PowerPoint presentation. Instructor discusses importance of knowing and understanding resting, exercise, and post-exercise oxygen saturation, heart rate, and blood pressure. Oxygen saturation, heart rate, blood pressure, rating of perceived exertion, and dyspnea are reviewed along with a normal range for these values.    Exercise for the Pulmonary Patient Group instruction that is supported by a PowerPoint presentation. Instructor discusses benefits of exercise, core components of exercise, frequency, duration, and intensity of an exercise routine, importance of utilizing pulse oximetry during exercise, safety while exercising, and options of places to exercise outside of rehab.  Flowsheet Row PULMONARY REHAB CHRONIC OBSTRUCTIVE PULMONARY DISEASE from 05/01/2021 in [facility]  Date 04/03/21  Educator --  [Handout]       MET Level  Group instruction provided by PowerPoint, verbal discussion, and written material to support subject matter. Instructor reviews what METs are and how to increase METs.    Pulmonary Medications Verbally interactive group  education provided by instructor with focus on inhaled medications and proper administration. Flowsheet Row PULMONARY REHAB CHRONIC OBSTRUCTIVE PULMONARY DISEASE from 05/01/2021 in [facility], [facility]  Date 04/10/21  Educator Lisette Abu  [Handout]       Anatomy and Physiology of the Respiratory System Group instruction provided by PowerPoint, verbal discussion, and written material to support subject matter. Instructor reviews respiratory cycle and anatomical components of the respiratory system and their functions. Instructor also reviews differences in obstructive and restrictive respiratory diseases with examples of each.  Flowsheet Row PULMONARY REHAB CHRONIC OBSTRUCTIVE PULMONARY DISEASE from 05/01/2021 in [facility] for [facility], [facility]  Date 05/01/21  Educator Lisette Abu  [Handout]       Oxygen Safety Group instruction provided by PowerPoint, verbal discussion, and written material to support subject matter. There is an overview of "What is Oxygen" and "Why do we need it".  Instructor also reviews how to create a safe environment for oxygen use, the importance of using oxygen as prescribed, and the risks of noncompliance. There is a brief discussion on traveling with oxygen and resources the patient may utilize. Flowsheet Row PULMONARY REHAB CHRONIC OBSTRUCTIVE PULMONARY DISEASE from 02/13/2016 in [facility]  Date 01/02/16  Educator rn  Instruction Review Code (Retired) 2- meets goals/outcomes       Oxygen Use Group instruction provided by PowerPoint, verbal discussion, and written material to discuss how supplemental oxygen is prescribed  and different types of oxygen supply systems. Resources for more information are provided.    Breathing Techniques Group instruction that is supported by demonstration and informational handouts. Instructor  discusses the benefits of pursed lip and diaphragmatic breathing and detailed demonstration on how to perform both.     Risk Factor Reduction Group instruction that is supported by a PowerPoint presentation. Instructor discusses the definition of a risk factor, different risk factors for pulmonary disease, and how the heart and lungs work together.   Pulmonary Diseases Group instruction provided by PowerPoint, verbal discussion, and written material to support subject matter. Instructor gives an overview of the different type of pulmonary diseases. There is also a discussion on risk factors and symptoms as well as ways to manage the diseases.   Stress and Energy Conservation Group instruction provided by PowerPoint, verbal discussion, and written material to support subject matter. Instructor gives an overview of stress and the impact it can have on the body. Instructor also reviews ways to reduce stress. There is also a discussion on energy conservation and ways to conserve energy throughout the day.   Warning Signs and Symptoms Group instruction provided by PowerPoint, verbal discussion, and written material to support subject matter. Instructor reviews warning signs and symptoms of stroke, heart attack, cold and flu. Instructor also reviews ways to prevent the spread of infection.   Other Education Group or individual verbal, written, or video instructions that support the educational goals of the pulmonary rehab program. Flowsheet Row PULMONARY REHAB CHRONIC OBSTRUCTIVE PULMONARY DISEASE from 05/01/2021 in [facility], V[facility], & Lung Health  Date 04/17/21  Educator --  [facility] Plate]        Knowledge Questionnaire Score:  Knowledge Questionnaire Score - 01/08/23 1610       Knowledge Questionnaire Score   Pre Score 15/18             Core Components/Risk Factors/Patient Goals at Admission:  Personal Goals and Risk Factors at Admission -  01/08/23 0836       Core Components/Risk Factors/Patient Goals on Admission    Weight Management Yes;Weight Maintenance    Improve shortness of breath with ADL's Yes    Intervention Provide education, individualized exercise plan and daily activity instruction to help decrease symptoms of SOB with activities of daily living.    Expected Outcomes Short Term: Improve cardiorespiratory fitness to achieve a reduction of symptoms when performing ADLs;Long Term: Be able to perform more ADLs without symptoms or delay the onset of symptoms             Core Components/Risk Factors/Patient Goals Review:    Core Components/Risk Factors/Patient Goals at Discharge (Final Review):    ITP Comments:   Comments: Dr. Mechele Collin is Medical Director for Pulmonary Rehab at [facility].

## 2023-01-08 NOTE — Progress Notes (Signed)
Patricia Johnston 72 y.o. female Pulmonary Rehab Orientation Note This patient who was referred to Pulmonary Rehab by Dr. Benay Pike with the diagnosis of severe persistent asthma arrived today in Cardiac and Pulmonary Rehab. She  arrived ambulatory with normal gait. She  does not carry portable oxygen.  Per patient, Patricia Johnston uses oxygen never. Color good, skin warm and dry. Patient is oriented to time and place. Patient's medical history, psychosocial health, and medications reviewed. Psychosocial assessment reveals patient lives with alone. [name] is currently retired. Patient hobbies include reading and gardening. Patient reports her stress level is low. Areas of stress/anxiety include health. Patient does not exhibit signs of depression. PHQ2/9 score 0/0. Patricia Johnston shows good  coping skills with positive outlook on life. Offered emotional support and reassurance. Will continue to monitor. Physical assessment performed by Nurse pick: Patricia Hart RN. Please see their orientation physical assessment note. Patricia Johnston reports she does take medications as prescribed. Patient states she follows a regular  diet.  The patient would like to maintain her weight... Patient's weight will be monitored closely. Demonstration and practice of PLB using pulse oximeter. Patricia Johnston able to return demonstration satisfactorily. Safety and hand hygiene in the exercise area reviewed with patient. Patricia Johnston voices understanding of the information reviewed. Department expectations discussed with patient and achievable goals were set. The patient shows enthusiasm about attending the program and we look forward to working with Patricia Johnston. Patricia Johnston completed a 6 min walk test today and is scheduled to begin exercise on 11/14@1 :15.   [id number] Patricia Johnston, BSRT

## 2023-01-14 ENCOUNTER — Encounter (HOSPITAL_COMMUNITY)
Admission: RE | Admit: 2023-01-14 | Discharge: 2023-01-14 | Disposition: A | Discharge status: Home / Self Care | Payer: Medicare Other | Source: Ambulatory Visit | Attending: Pulmonary Disease | Admitting: Pulmonary Disease

## 2023-01-14 DIAGNOSIS — J449 Chronic obstructive pulmonary disease, unspecified: Secondary | ICD-10-CM | POA: Diagnosis not present

## 2023-01-14 DIAGNOSIS — J4550 Severe persistent asthma, uncomplicated: Secondary | ICD-10-CM

## 2023-01-14 NOTE — Progress Notes (Signed)
Daily Session Note  Patient Details  Name: Patricia Johnston MRN: [id number] Date of Birth: 1951/01/18 Referring Provider:   [name] Pulmonary Rehab Walk Test from 01/08/2023 in [facility]  Referring Provider [name]       Encounter Date: 01/14/2023  Check In:  Session Check In - 01/14/23 1350       Check-In   Supervising physician immediately available to respond to emergencies [facility] MD immediately available    Physician(s) Bernadene Person, NP    Location [facility]    Staff Present Raford Pitcher, MS, ACSM-CEP, Exercise Physiologist;Casey Erin Sons BS, ACSM-CEP, Exercise Physiologist;Samantha Belarus, RD, LDN    Virtual Visit No    Medication changes reported     No    Fall or balance concerns reported    No    Tobacco Cessation No Change    Warm-up and Cool-down Performed as group-led instruction    Resistance Training Performed Yes    VAD Patient? No    PAD/SET Patient? No      Pain Assessment   Currently in Pain? No/denies    Multiple Pain Sites No             Capillary Blood Glucose: No results found for this or any previous visit (from the past 24 hour(s)).    Social History   Tobacco Use  Smoking Status Never  Smokeless Tobacco Never    Goals Met:  Exercise tolerated well No report of concerns or symptoms today Strength training completed today  Goals Unmet:  Not Applicable  Comments: Service time is from 1310 to 1440.    Dr. Mechele Collin is Medical Director for Pulmonary Rehab at [facility].

## 2023-01-19 ENCOUNTER — Encounter (HOSPITAL_COMMUNITY)
Admission: RE | Admit: 2023-01-19 | Discharge: 2023-01-19 | Disposition: A | Discharge status: Home / Self Care | Payer: Medicare Other | Source: Ambulatory Visit | Attending: Pulmonary Disease

## 2023-01-19 DIAGNOSIS — J449 Chronic obstructive pulmonary disease, unspecified: Secondary | ICD-10-CM | POA: Diagnosis not present

## 2023-01-19 DIAGNOSIS — J4550 Severe persistent asthma, uncomplicated: Secondary | ICD-10-CM

## 2023-01-19 NOTE — Progress Notes (Signed)
Daily Session Note  Patient Details  Name: Patricia Johnston MRN: [id number] Date of Birth: 10-19-1950 Referring Provider:   [name] Pulmonary Rehab Walk Test from 01/08/2023 in [facility]  Referring Provider [name]       Encounter Date: 01/19/2023  Check In:  Session Check In - 01/19/23 1329       Check-In   Supervising physician immediately available to respond to emergencies [facility] MD immediately available    Physician(s) Bernadene Person, NP    Location [facility]    Staff Present Raford Pitcher, MS, ACSM-CEP, Exercise Physiologist;Casey Erin Sons BS, ACSM-CEP, Exercise Physiologist;Samantha Belarus, RD, [place] Gray, RN, BSN    Virtual Visit No    Medication changes reported     No    Fall or balance concerns reported    No    Tobacco Cessation No Change    Warm-up and Cool-down Performed as group-led Writer Performed Yes    VAD Patient? No    PAD/SET Patient? No      Pain Assessment   Currently in Pain? No/denies    Multiple Pain Sites No             Capillary Blood Glucose: No results found for this or any previous visit (from the past 24 hour(s)).    Social History   Tobacco Use  Smoking Status Never  Smokeless Tobacco Never    Goals Met:  Exercise tolerated well No report of concerns or symptoms today Strength training completed today  Goals Unmet:  Not Applicable  Comments: Service time is from 1310 to 1444.    Dr. Mechele Collin is Medical Director for Pulmonary Rehab at [facility].

## 2023-01-21 ENCOUNTER — Encounter (HOSPITAL_COMMUNITY)
Admission: RE | Admit: 2023-01-21 | Discharge: 2023-01-21 | Disposition: A | Discharge status: Home / Self Care | Payer: Medicare Other | Source: Ambulatory Visit | Attending: Pulmonary Disease | Admitting: Pulmonary Disease

## 2023-01-21 ENCOUNTER — Telehealth (HOSPITAL_COMMUNITY): Payer: Self-pay

## 2023-01-21 DIAGNOSIS — J449 Chronic obstructive pulmonary disease, unspecified: Secondary | ICD-10-CM | POA: Diagnosis not present

## 2023-01-21 DIAGNOSIS — J4550 Severe persistent asthma, uncomplicated: Secondary | ICD-10-CM

## 2023-01-21 NOTE — Telephone Encounter (Signed)
Patricia Johnston from [place] called asking for [place] to fax the letters for travel reimbursement to them for them to be in their system!

## 2023-01-21 NOTE — Progress Notes (Signed)
Daily Session Note  Patient Details  Name: Patricia Johnston MRN: [id number] Date of Birth: January 23, 1951 Referring Provider:   [name] Pulmonary Rehab Walk Test from 01/08/2023 in [facility] for [facility], Vascular, & [facility]  Referring Provider [name]       Encounter Date: 01/21/2023  Check In:  Session Check In - 01/21/23 1332       Check-In   Supervising physician immediately available to respond to emergencies [facility] MD immediately available    Physician(s) Reather Littler, NP    Location [facility]    Staff Present Raford Pitcher, MS, ACSM-CEP, Exercise Physiologist;Mylez Venable Erin Sons BS, ACSM-CEP, Exercise Physiologist;Mary Gerre Scull, RN, BSN    Virtual Visit No    Medication changes reported     No    Fall or balance concerns reported    No    Tobacco Cessation No Change    Warm-up and Cool-down Performed as group-led instruction    Resistance Training Performed Yes    VAD Patient? No    PAD/SET Patient? No      Pain Assessment   Currently in Pain? No/denies    Multiple Pain Sites No             Capillary Blood Glucose: No results found for this or any previous visit (from the past 24 hour(s)).    Social History   Tobacco Use  Smoking Status Never  Smokeless Tobacco Never    Goals Met:  Proper associated with RPD/PD & O2 Sat Independence with exercise equipment Exercise tolerated well No report of concerns or symptoms today Strength training completed today  Goals Unmet:  Not Applicable  Comments: Service time is from 1325 to 1435.    Dr. Mechele Collin is Medical Director for Pulmonary Rehab at [facility].

## 2023-01-26 ENCOUNTER — Encounter (HOSPITAL_COMMUNITY)
Admission: RE | Admit: 2023-01-26 | Discharge: 2023-01-26 | Disposition: A | Discharge status: Home / Self Care | Payer: Medicare Other | Source: Ambulatory Visit | Attending: Pulmonary Disease

## 2023-01-26 ENCOUNTER — Telehealth (HOSPITAL_COMMUNITY): Payer: Self-pay

## 2023-01-26 ENCOUNTER — Encounter (HOSPITAL_COMMUNITY): Payer: Self-pay

## 2023-01-26 VITALS — Wt 131.0 lb

## 2023-01-26 DIAGNOSIS — J4550 Severe persistent asthma, uncomplicated: Secondary | ICD-10-CM

## 2023-01-26 DIAGNOSIS — J449 Chronic obstructive pulmonary disease, unspecified: Secondary | ICD-10-CM | POA: Diagnosis not present

## 2023-01-26 NOTE — Progress Notes (Signed)
Daily Session Note  Patient Details  Name: Patricia Johnston MRN: [id number] Date of Birth: [birth year] Referring Provider:   [name] Pulmonary Rehab Walk Test from 01/08/2023 in [facility]  Referring Provider [name]       Encounter Date: 01/26/2023  Check In:  Session Check In - 01/26/23 1144       Check-In   Supervising physician immediately available to respond to emergencies [facility] MD immediately available    Physician(s) Jari Favre, NP    [facility]    Staff Present Raford Pitcher, MS, ACSM-CEP, Exercise Physiologist;Jermale Crass Erin Sons BS, ACSM-CEP, Exercise Physiologist;Mary Gerre Scull, RN, BSN    Virtual Visit No    Medication changes reported     No    Fall or balance concerns reported    No    Tobacco Cessation No Change    Warm-up and Cool-down Performed as group-led instruction    Resistance Training Performed Yes    VAD Patient? No    PAD/SET Patient? No      Pain Assessment   Currently in Pain? No/denies    Multiple Pain Sites No             Capillary Blood Glucose: No results found for this or any previous visit (from the past 24 hour(s)).   Exercise Prescription Changes - 01/26/23 1100       Response to Exercise   Blood Pressure (Admit) 106/58    Blood Pressure (Exercise) 154/66    Blood Pressure (Exit) 120/64    Heart Rate (Admit) 79 bpm    Heart Rate (Exercise) 105 bpm    Heart Rate (Exit) 91 bpm    Oxygen Saturation (Admit) 93 %    Oxygen Saturation (Exercise) 90 %    Oxygen Saturation (Exit) 91 %    Rating of Perceived Exertion (Exercise) 11    Perceived Dyspnea (Exercise) 1    Duration Continue with 30 min of aerobic exercise without signs/symptoms of physical distress.    Intensity THRR unchanged      Progression   Progression Continue to progress workloads to maintain intensity without signs/symptoms of physical distress.       Resistance Training   Training Prescription Yes    Weight blue bands    Reps 10-15    Time 10 Minutes      Treadmill   MPH 2.4    Grade 2    Minutes 15    METs 3.3      Recumbant Elliptical   Level 2    RPM 69    Watts 55    Minutes 15    METs 4.4             Social History   Tobacco Use  Smoking Status Never  Smokeless Tobacco Never    Goals Met:  Proper associated with RPD/PD & O2 Sat Independence with exercise equipment Exercise tolerated well No report of concerns or symptoms today Strength training completed today  Goals Unmet:  Not Applicable  Comments: Service time is from 1014 to 1131.    Dr. Mechele Collin is Medical Director for Pulmonary Rehab at [facility].

## 2023-01-26 NOTE — Telephone Encounter (Signed)
Patricia Johnston called and left a VM she has a dr appt this afternoon and would like to join the 1015 class today!

## 2023-01-27 NOTE — Progress Notes (Signed)
Pulmonary Individual Treatment Plan  Patient Details  Name: Patricia Johnston MRN: [id number] Date of Birth: 1950-12-23 Referring Provider:   [name] Pulmonary Rehab Walk Test from 01/08/2023 in [facility]  Referring Provider Bina  [Ellison]       Initial Encounter Date:  Flowsheet Row Pulmonary Rehab Walk Test from 01/08/2023 in [facility]  Date 01/08/23       Visit Diagnosis: Severe persistent asthma, unspecified whether complicated  Patient's Home Medications on Admission:   Current Outpatient Medications:    albuterol (PROVENTIL) (2.5 MG/3ML) 0.083% nebulizer solution, Take 2.5 mg by nebulization as needed for wheezing or shortness of breath., Disp: , Rfl:    albuterol (VENTOLIN HFA) 108 (90 Base) MCG/ACT inhaler, Inhale 2 puffs into the lungs every 4 (four) hours as needed for wheezing or shortness of breath., Disp: 1 Inhaler, Rfl: 1   alendronate (FOSAMAX) 70 MG tablet, Take 70 mg by mouth every Monday. Take with a full glass of water on an empty stomach., Disp: , Rfl:    Calcium Carb-Cholecalciferol (CALCIUM 500+D PO), Take 1 tablet by mouth daily., Disp: , Rfl:    diphenhydrAMINE (BENADRYL) 25 MG tablet, Take 25 mg by mouth every 6 (six) hours as needed (anxiety)., Disp: , Rfl:    feeding supplement, ENSURE ENLIVE, (ENSURE ENLIVE) LIQD, Take 237 mLs by mouth 2 (two) times daily between meals., Disp: 237 mL, Rfl: 12   guaiFENesin (MUCINEX) 600 MG 12 hr tablet, Take 1 tablet (600 mg total) by mouth 2 (two) times daily., Disp: 30 tablet, Rfl: 0   mometasone (ASMANEX) 220 MCG/INH inhaler, Inhale 1 puff into the lungs 2 (two) times daily., Disp: 1 each, Rfl: 11   Multiple Vitamins-Minerals (MULTIVITAMIN WITH MINERALS) tablet, Take 2 tablets by mouth daily., Disp: , Rfl:    pantoprazole (PROTONIX) 40 MG tablet, Take 1 tablet (40 mg total) by mouth daily., Disp:  90 tablet, Rfl: 3   predniSONE (DELTASONE) 20 MG tablet, 2 tabs po daily x 4 days, Disp: 8 tablet, Rfl: 0   sodium chloride HYPERTONIC 3 % nebulizer solution, INHALE 1 VIAL BY VIA NEBULIZER TWICE A DAY, Disp: , Rfl:    Tiotropium Bromide-Olodaterol 2.5-2.5 MCG/ACT AERS, Inhale 2 puffs into the lungs daily., Disp: 1 each, Rfl: 11  Current Facility-Administered Medications:    0.9 %  sodium chloride infusion, 500 mL, Intravenous, Once, Meryl Dare, MD  Past Medical History: Past Medical History:  Diagnosis Date   Anxiety    Asthma    COPD (chronic obstructive pulmonary disease) (HCC)    pt states due to [employer] service contracted COPD   Difficult intubation    Pt does not remember details over 30 years ago   Eczema    Esophageal thickening 10/13/2018   on CT- [facility]   GERD (gastroesophageal reflux disease)    Hiatal hernia    Osteoporosis    PONV (postoperative nausea and vomiting)    Pulmonary emphysema (HCC)    Pulmonary nodules    Traumatic arthritis    Umbilical hernia    Wears glasses     Tobacco Use: Social History   Tobacco Use  Smoking Status Never  Smokeless Tobacco Never    Labs: Review Flowsheet        No data to display          Capillary Blood Glucose: Lab Results  Component Value  Date   GLUCAP 99 06/16/2017     Pulmonary Assessment Scores:  Pulmonary Assessment Scores     Row Name 01/08/23 0853         ADL UCSD   ADL Phase Entry     SOB Score total 39       CAT Score   CAT Score 20       mMRC Score   mMRC Score 2             UCSD: Self-administered rating of dyspnea associated with activities of daily living (ADLs) 6-point scale (0 = "not at all" to 5 = "maximal or unable to do because of breathlessness")  Scoring Scores range from 0 to 120.  Minimally important difference is 5 units  CAT: CAT can identify the health impairment of COPD patients and is better correlated with disease  progression.  CAT has a scoring range of zero to 40. The CAT score is classified into four groups of low (less than 10), medium (10 - 20), high (21-30) and very high (31-40) based on the impact level of disease on health status. A CAT score over 10 suggests significant symptoms.  A worsening CAT score could be explained by an exacerbation, poor medication adherence, poor inhaler technique, or progression of COPD or comorbid conditions.  CAT MCID is 2 points  mMRC: mMRC (Modified Medical Research Council) Dyspnea Scale is used to assess the degree of baseline functional disability in patients of respiratory disease due to dyspnea. No minimal important difference is established. A decrease in score of 1 point or greater is considered a positive change.   Pulmonary Function Assessment:  Pulmonary Function Assessment - 01/08/23 0838       Breath   Bilateral Breath Sounds Clear;Decreased    Shortness of Breath Yes;Panic with Shortness of Breath             Exercise Target Goals: Exercise Program Goal: Individual exercise prescription set using results from initial 6 min walk test and THRR while considering  patient's activity barriers and safety.   Exercise Prescription Goal: Initial exercise prescription builds to 30-45 minutes a day of aerobic activity, 2-3 days per week.  Home exercise guidelines will be given to patient during program as part of exercise prescription that the participant will acknowledge.  Activity Barriers & Risk Stratification:  Activity Barriers & Cardiac Risk Stratification - 01/08/23 0837       Activity Barriers & Cardiac Risk Stratification   Activity Barriers Deconditioning;Muscular Weakness;Shortness of Breath    Cardiac Risk Stratification Low             6 Minute Walk:  6 Minute Walk     Row Name 01/08/23 0918         6 Minute Walk   Phase Initial     Distance 1202 feet     Walk Time 6 minutes     # of Rest Breaks 0     MPH 2.28      METS 2.69     RPE 7     Perceived Dyspnea  0.5     VO2 Peak 9.43     Symptoms No     Resting HR 64 bpm     Resting BP 112/68     Resting Oxygen Saturation  94 %     Exercise Oxygen Saturation  during 6 min walk 91 %     Max Ex. HR 85 bpm     Max Ex. BP  120/66     2 Minute Post BP 108/62       Interval HR   1 Minute HR 80     2 Minute HR 83     3 Minute HR 83     4 Minute HR 85     5 Minute HR 85     6 Minute HR 83     2 Minute Post HR 66     Interval Heart Rate? Yes       Interval Oxygen   Interval Oxygen? Yes     Baseline Oxygen Saturation % 94 %     1 Minute Oxygen Saturation % 93 %     1 Minute Liters of Oxygen 0 L     2 Minute Oxygen Saturation % 92 %     2 Minute Liters of Oxygen 0 L     3 Minute Oxygen Saturation % 91 %     3 Minute Liters of Oxygen 0 L     4 Minute Oxygen Saturation % 92 %     4 Minute Liters of Oxygen 0 L     5 Minute Oxygen Saturation % 93 %     5 Minute Liters of Oxygen 0 L     6 Minute Oxygen Saturation % 93 %     6 Minute Liters of Oxygen 0 L     2 Minute Post Oxygen Saturation % 94 %     2 Minute Post Liters of Oxygen 0 L              Oxygen Initial Assessment:  Oxygen Initial Assessment - 01/08/23 0838       Home Oxygen   Home Oxygen Device None    Sleep Oxygen Prescription None    Home Exercise Oxygen Prescription None    Home Resting Oxygen Prescription None      Initial 6 min Walk   Oxygen Used None      Program Oxygen Prescription   Program Oxygen Prescription None      Intervention   Short Term Goals To learn and understand importance of monitoring SPO2 with pulse oximeter and demonstrate accurate use of the pulse oximeter.;To learn and understand importance of maintaining oxygen saturations>88%;To learn and demonstrate proper pursed lip breathing techniques or other breathing techniques. ;To learn and demonstrate proper use of respiratory medications    Long  Term Goals Maintenance of O2 saturations>88%;Compliance  with respiratory medication;Verbalizes importance of monitoring SPO2 with pulse oximeter and return demonstration;Exhibits proper breathing techniques, such as pursed lip breathing or other method taught during program session;Demonstrates proper use of MDI's             Oxygen Re-Evaluation:  Oxygen Re-Evaluation     Row Name 01/19/23 0814             Program Oxygen Prescription   Program Oxygen Prescription None         Home Oxygen   Home Oxygen Device None       Sleep Oxygen Prescription None       Home Exercise Oxygen Prescription None       Home Resting Oxygen Prescription None         Goals/Expected Outcomes   Short Term Goals To learn and understand importance of monitoring SPO2 with pulse oximeter and demonstrate accurate use of the pulse oximeter.;To learn and understand importance of maintaining oxygen saturations>88%;To learn and demonstrate proper pursed lip breathing techniques or other breathing techniques. ;To  learn and demonstrate proper use of respiratory medications       Long  Term Goals Maintenance of O2 saturations>88%;Compliance with respiratory medication;Verbalizes importance of monitoring SPO2 with pulse oximeter and return demonstration;Exhibits proper breathing techniques, such as pursed lip breathing or other method taught during program session;Demonstrates proper use of MDI's       Goals/Expected Outcomes Compliance and understanding of oxygen saturation and breathing techniques to decrease shortness of breath                Oxygen Discharge (Final Oxygen Re-Evaluation):  Oxygen Re-Evaluation - 01/19/23 0814       Program Oxygen Prescription   Program Oxygen Prescription None      Home Oxygen   Home Oxygen Device None    Sleep Oxygen Prescription None    Home Exercise Oxygen Prescription None    Home Resting Oxygen Prescription None      Goals/Expected Outcomes   Short Term Goals To learn and understand importance of monitoring SPO2  with pulse oximeter and demonstrate accurate use of the pulse oximeter.;To learn and understand importance of maintaining oxygen saturations>88%;To learn and demonstrate proper pursed lip breathing techniques or other breathing techniques. ;To learn and demonstrate proper use of respiratory medications    Long  Term Goals Maintenance of O2 saturations>88%;Compliance with respiratory medication;Verbalizes importance of monitoring SPO2 with pulse oximeter and return demonstration;Exhibits proper breathing techniques, such as pursed lip breathing or other method taught during program session;Demonstrates proper use of MDI's    Goals/Expected Outcomes Compliance and understanding of oxygen saturation and breathing techniques to decrease shortness of breath             Initial Exercise Prescription:  Initial Exercise Prescription - 01/08/23 0900       Date of Initial Exercise RX and Referring Provider   Date 01/08/23    Referring Provider [name]   Expected Discharge Date 04/08/23      Treadmill   MPH 2    Grade 2    Minutes 15    METs 3.6      Recumbant Elliptical   Level 2    RPM 60    Watts 50    Minutes 15    METs 2.5      Prescription Details   Frequency (times per week) 2    Duration Progress to 30 minutes of continuous aerobic without signs/symptoms of physical distress      Intensity   THRR 40-80% of Max Heartrate 59-118    Ratings of Perceived Exertion 11-13    Perceived Dyspnea 0-4      Progression   Progression Continue progressive overload as per policy without signs/symptoms or physical distress.      Resistance Training   Training Prescription Yes    Weight blue bands    Reps 10-15             Perform Capillary Blood Glucose checks as needed.  Exercise Prescription Changes:   Exercise Prescription Changes     Row Name 01/26/23 1100             Response to Exercise   Blood Pressure (Admit) 106/58       Blood Pressure (Exercise)  154/66       Blood Pressure (Exit) 120/64       Heart Rate (Admit) 79 bpm       Heart Rate (Exercise) 105 bpm       Heart Rate (Exit) 91 bpm  Oxygen Saturation (Admit) 93 %       Oxygen Saturation (Exercise) 90 %       Oxygen Saturation (Exit) 91 %       Rating of Perceived Exertion (Exercise) 11       Perceived Dyspnea (Exercise) 1       Duration Continue with 30 min of aerobic exercise without signs/symptoms of physical distress.       Intensity THRR unchanged         Progression   Progression Continue to progress workloads to maintain intensity without signs/symptoms of physical distress.         Resistance Training   Training Prescription Yes       Weight blue bands       Reps 10-15       Time 10 Minutes         Treadmill   MPH 2.4       Grade 2       Minutes 15       METs 3.3         Recumbant Elliptical   Level 2       RPM 69       Watts 55       Minutes 15       METs 4.4                Exercise Comments:   Exercise Comments     Row Name 01/14/23 1456           Exercise Comments Pt completed first day of group exercise. She exercised on the recumbent elliptical for 15 min, level 2, METs 3.6. She then walked on the treadmill for 15 min, 2 mph, 2 incline, METs 3.0. Tolerated well. Pt able to perform warm up and cool down, including squats. Discussed with pt METs with good reception.                Exercise Goals and Review:   Exercise Goals     Row Name 01/08/23 0837             Exercise Goals   Increase Physical Activity Yes       Intervention Provide advice, education, support and counseling about physical activity/exercise needs.;Develop an individualized exercise prescription for aerobic and resistive training based on initial evaluation findings, risk stratification, comorbidities and participant's personal goals.       Expected Outcomes Short Term: Attend rehab on a regular basis to increase amount of physical activity.;Long Term:  Add in home exercise to make exercise part of routine and to increase amount of physical activity.;Long Term: Exercising regularly at least 3-5 days a week.       Increase Strength and Stamina Yes       Intervention Provide advice, education, support and counseling about physical activity/exercise needs.;Develop an individualized exercise prescription for aerobic and resistive training based on initial evaluation findings, risk stratification, comorbidities and participant's personal goals.       Expected Outcomes Short Term: Increase workloads from initial exercise prescription for resistance, speed, and METs.;Short Term: Perform resistance training exercises routinely during rehab and add in resistance training at home;Long Term: Improve cardiorespiratory fitness, muscular endurance and strength as measured by increased METs and functional capacity ( )       Able to understand and use rate of perceived exertion (RPE) scale Yes       Intervention Provide education and explanation on how to use RPE scale  Expected Outcomes Short Term: Able to use RPE daily in rehab to express subjective intensity level;Long Term:  Able to use RPE to guide intensity level when exercising independently       Able to understand and use Dyspnea scale Yes       Intervention Provide education and explanation on how to use Dyspnea scale       Expected Outcomes Short Term: Able to use Dyspnea scale daily in rehab to express subjective sense of shortness of breath during exertion;Long Term: Able to use Dyspnea scale to guide intensity level when exercising independently       Knowledge and understanding of Target Heart Rate Range (THRR) Yes       Intervention Provide education and explanation of THRR including how the numbers were predicted and where they are located for reference       Expected Outcomes Short Term: Able to state/look up THRR;Long Term: Able to use THRR to govern intensity when exercising  independently;Short Term: Able to use daily as guideline for intensity in rehab       Understanding of Exercise Prescription Yes       Intervention Provide education, explanation, and written materials on patient's individual exercise prescription       Expected Outcomes Short Term: Able to explain program exercise prescription                Exercise Goals Re-Evaluation :  Exercise Goals Re-Evaluation     Row Name 01/19/23 0812             Exercise Goal Re-Evaluation   Exercise Goals Review Increase Physical Activity;Able to understand and use Dyspnea scale;Understanding of Exercise Prescription;Increase Strength and Stamina;Knowledge and understanding of Target Heart Rate Range (THRR);Able to understand and use rate of perceived exertion (RPE) scale       Comments Patricia Johnston has completed one group exercise session.She exercised on the recumbent elliptical for 15 min, level 2, METs 3.6. She then walked on the treadmill for 15 min, 2 mph, 2 incline, METs 3.0. Tolerated well. Pt able to perform warm up and cool down, including squats. She tolerated her first day well and we will progress her as tolerated.       Expected Outcomes Through exercise at rehab and home, the patient will decrease shortness of breath with daily activities and feel confident in carrying out an exercise regimen at home.                Discharge Exercise Prescription (Final Exercise Prescription Changes):  Exercise Prescription Changes - 01/26/23 1100       Response to Exercise   Blood Pressure (Admit) 106/58    Blood Pressure (Exercise) 154/66    Blood Pressure (Exit) 120/64    Heart Rate (Admit) 79 bpm    Heart Rate (Exercise) 105 bpm    Heart Rate (Exit) 91 bpm    Oxygen Saturation (Admit) 93 %    Oxygen Saturation (Exercise) 90 %    Oxygen Saturation (Exit) 91 %    Rating of Perceived Exertion (Exercise) 11    Perceived Dyspnea (Exercise) 1    Duration Continue with 30 min of aerobic exercise  without signs/symptoms of physical distress.    Intensity THRR unchanged      Progression   Progression Continue to progress workloads to maintain intensity without signs/symptoms of physical distress.      Resistance Training   Training Prescription Yes    Weight blue bands    Reps  10-15    Time 10 Minutes      Treadmill   MPH 2.4    Grade 2    Minutes 15    METs 3.3      Recumbant Elliptical   Level 2    RPM 69    Watts 55    Minutes 15    METs 4.4             Nutrition:  Target Goals: Understanding of nutrition guidelines, daily intake of sodium 1500mg , cholesterol 200mg , calories 30% from fat and 7% or less from saturated fats, daily to have 5 or more servings of fruits and vegetables.  Biometrics:    Nutrition Therapy Plan and Nutrition Goals:  Nutrition Therapy & Goals - 01/14/23 1403       Nutrition Therapy   Diet General Healthy Diet      Personal Nutrition Goals   Nutrition Goal Patient to maintain diet quality by using the plate method as a guide for meal planning to include lean protein/plant protein, fruits, vegetables, whole grains, nonfat dairy as part of a well-balanced diet.    Personal Goal #2 Patient to maintain weight throughout duration of pulmonary rehab.    Comments Patient has medical history of COPD, bronchiectasis, acute and chronic respiratory failure, severe persistent astham. Patricia Johnston reports following a regular diet and motivation to maintain her weight. She has a history of using Ensure supplements 2x/day (220kcals, 9g protein each). She has previously completed pulmonary rehab in 2017 and most recent in March of 2023. Patient will benefit from participation in pulmonary rehab for nutrition and exercise support.      Intervention Plan   Intervention Prescribe, educate and counsel regarding individualized specific dietary modifications aiming towards targeted core components such as weight, hypertension, lipid management, diabetes,  heart failure and other comorbidities.;Nutrition handout(s) given to patient.    Expected Outcomes Short Term Goal: Understand basic principles of dietary content, such as calories, fat, sodium, cholesterol and nutrients.;Long Term Goal: Adherence to prescribed nutrition plan.             Nutrition Assessments:  Nutrition Assessments - 01/19/23 1452       Rate Your Plate Scores   Pre Score 72            MEDIFICTS Score Key: >=70 Need to make dietary changes  40-70 Heart Healthy Diet <= 40 Therapeutic Level Cholesterol Diet  Flowsheet Row PULMONARY REHAB OTHER RESPIRATORY from [facility]/19/2023 in [facility]  Picture Your Plate Total Score on Admission 72      Picture Your Plate Scores: <96 Unhealthy dietary pattern with much room for improvement. 41-50 Dietary pattern unlikely to meet recommendations for good health and room for improvement. 51-60 More healthful dietary pattern, with some room for improvement.  >60 Healthy dietary pattern, although there may be some specific behaviors that could be improved.    Nutrition Goals Re-Evaluation:  Nutrition Goals Re-Evaluation     Row Name 01/14/23 1403             Goals   Current Weight 129 lb 6.6 oz (58.7 kg)       Comment A1c 5.7       Expected Outcome Patient has medical history of COPD, bronchiectasis, acute and chronic respiratory failure, severe persistent astham. Patricia Johnston reports following a regular diet and motivation to maintain her weight. She has a history of using Ensure supplements 2x/day (220kcals, 9g protein each). She  has previously completed pulmonary rehab in 2017 and most recent in March of 2023. Patient will benefit from participation in pulmonary rehab for nutrition and exercise support.                Nutrition Goals Discharge (Final Nutrition Goals Re-Evaluation):  Nutrition Goals Re-Evaluation - 01/14/23 1403       Goals   Current  Weight 129 lb 6.6 oz (58.7 kg)    Comment A1c 5.7    Expected Outcome Patient has medical history of COPD, bronchiectasis, acute and chronic respiratory failure, severe persistent astham. Shiran reports following a regular diet and motivation to maintain her weight. She has a history of using Ensure supplements 2x/day (220kcals, 9g protein each). She has previously completed pulmonary rehab in 2017 and most recent in March of 2023. Patient will benefit from participation in pulmonary rehab for nutrition and exercise support.             Psychosocial: Target Goals: Acknowledge presence or absence of significant depression and/or stress, maximize coping skills, provide positive support system. Participant is able to verbalize types and ability to use techniques and skills needed for reducing stress and depression.  Initial Review & Psychosocial Screening:  Initial Psych Review & Screening - 01/08/23 0832       Initial Review   Current issues with None Identified      Family Dynamics   Good Support System? Yes    Comments Pt has good support through her family and church.      Barriers   Psychosocial barriers to participate in program There are no identifiable barriers or psychosocial needs.      Screening Interventions   Interventions Encouraged to exercise             Quality of Life Scores:  Scores of 19 and below usually indicate a poorer quality of life in these areas.  A difference of  2-3 points is a clinically meaningful difference.  A difference of 2-3 points in the total score of the Quality of Life Index has been associated with significant improvement in overall quality of life, self-image, physical symptoms, and general health in studies assessing change in quality of life.  PHQ-9: Review Flowsheet  More data may exist      01/08/2023 11/21/2021 05/29/2021 03/26/2021 09/27/2015  Depression screen PHQ 2/9  Decreased Interest 0 0 0 0 0 0  Down, Depressed, Hopeless  0 0 0 0 0 0  PHQ - 2 Score 0 0 0 0 0 0  Altered sleeping 0 - 0 0 -  Tired, decreased energy 0 - 0 1 -  Change in appetite 0 - 0 0 -  Feeling bad or failure about yourself  0 - 0 0 -  Trouble concentrating 0 - 0 0 -  Moving slowly or fidgety/restless 0 - 0 0 -  Suicidal thoughts 0 - 0 0 -  PHQ-9 Score 0 - 0 1 -  Difficult doing work/chores Not difficult at all - Not difficult at all Not difficult at all -    Details       Multiple values from one day are sorted in reverse-chronological order        Interpretation of Total Score  Total Score Depression Severity:  1-4 = Minimal depression, 5-9 = Mild depression, 10-14 = Moderate depression, 15-19 = Moderately severe depression, 20-27 = Severe depression   Psychosocial Evaluation and Intervention:  Psychosocial Evaluation - 01/08/23 2841  Psychosocial Evaluation & Interventions   Interventions Encouraged to exercise with the program and follow exercise prescription    Comments Patricia Johnston has no psychosocial concerns or barriers identified    Expected Outcomes For her to be able to participate in the PR program with no psychosocial  issues or concerns    Continue Psychosocial Services  No Follow up required             Psychosocial Re-Evaluation:  Psychosocial Re-Evaluation     Row Name 01/19/23 0846             Psychosocial Re-Evaluation   Current issues with None Identified       Comments At monthly re-evaluation, Patricia Johnston continues to deny any barriers or psychosocial concerns at this time       Expected Outcomes For Patricia Johnston to continue to be free of psychosocial concerns while participating in pulmonary rehab.       Interventions Encouraged to attend Pulmonary Rehabilitation for the exercise       Continue Psychosocial Services  No Follow up required                Psychosocial Discharge (Final Psychosocial Re-Evaluation):  Psychosocial Re-Evaluation - 01/19/23 0846       Psychosocial Re-Evaluation    Current issues with None Identified    Comments At monthly re-evaluation, Patricia Johnston continues to deny any barriers or psychosocial concerns at this time    Expected Outcomes For Patricia Johnston to continue to be free of psychosocial concerns while participating in pulmonary rehab.    Interventions Encouraged to attend Pulmonary Rehabilitation for the exercise    Continue Psychosocial Services  No Follow up required             Education: Education Goals: Education classes will be provided on a weekly basis, covering required topics. Participant will state understanding/return demonstration of topics presented.  Learning Barriers/Preferences:  Learning Barriers/Preferences - 01/08/23 0835       Learning Barriers/Preferences   Learning Barriers Sight;Cultural/Spiritual   wears glasses, pt is Jehovah witness   Learning Preferences Skilled Demonstration;Written Material;Group Instruction;Individual Instruction             Education Topics: Know Your Numbers Group instruction that is supported by a PowerPoint presentation. Instructor discusses importance of knowing and understanding resting, exercise, and post-exercise oxygen saturation, heart rate, and blood pressure. Oxygen saturation, heart rate, blood pressure, rating of perceived exertion, and dyspnea are reviewed along with a normal range for these values.    Exercise for the Pulmonary Patient Group instruction that is supported by a PowerPoint presentation. Instructor discusses benefits of exercise, core components of exercise, frequency, duration, and intensity of an exercise routine, importance of utilizing pulse oximetry during exercise, safety while exercising, and options of places to exercise outside of rehab.  Flowsheet Row PULMONARY REHAB CHRONIC OBSTRUCTIVE PULMONARY DISEASE from 05/01/2021 in [facility] for Heart, Vascular, & Lung Health  Date 04/03/21  Educator --  [Handout]       MET Level   Group instruction provided by PowerPoint, verbal discussion, and written material to support subject matter. Instructor reviews what METs are and how to increase METs.  Flowsheet Row PULMONARY REHAB OTHER RESPIRATORY from 01/21/2023 in [facility] for Heart, Vascular, & Lung Health  Date 01/21/23  Educator EP  Instruction Review Code 1- Verbalizes Understanding       Pulmonary Medications Verbally interactive group education provided by instructor with focus on inhaled medications and  proper administration. Flowsheet Row PULMONARY REHAB CHRONIC OBSTRUCTIVE PULMONARY DISEASE from 05/01/2021 in [facility] for Heart, Vascular, & Lung Health  Date 04/10/21  Educator Lisette Abu  [Handout]       Anatomy and Physiology of the Respiratory System Group instruction provided by PowerPoint, verbal discussion, and written material to support subject matter. Instructor reviews respiratory cycle and anatomical components of the respiratory system and their functions. Instructor also reviews differences in obstructive and restrictive respiratory diseases with examples of each.  Flowsheet Row PULMONARY REHAB CHRONIC OBSTRUCTIVE PULMONARY DISEASE from 05/01/2021 in [facility] for Heart, Vascular, & Lung Health  Date 05/01/21  Educator Lisette Abu  [Handout]       Oxygen Safety Group instruction provided by PowerPoint, verbal discussion, and written material to support subject matter. There is an overview of "What is Oxygen" and "Why do we need it".  Instructor also reviews how to create a safe environment for oxygen use, the importance of using oxygen as prescribed, and the risks of noncompliance. There is a brief discussion on traveling with oxygen and resources the patient may utilize. Flowsheet Row PULMONARY REHAB CHRONIC OBSTRUCTIVE PULMONARY DISEASE from 02/13/2016 in [facility] for Heart, Vascular, & Lung Health   Date 01/02/16  Educator rn  Instruction Review Code (Retired) 2- meets goals/outcomes       Oxygen Use Group instruction provided by PowerPoint, verbal discussion, and written material to discuss how supplemental oxygen is prescribed and different types of oxygen supply systems. Resources for more information are provided.    Breathing Techniques Group instruction that is supported by demonstration and informational handouts. Instructor discusses the benefits of pursed lip and diaphragmatic breathing and detailed demonstration on how to perform both.     Risk Factor Reduction Group instruction that is supported by a PowerPoint presentation. Instructor discusses the definition of a risk factor, different risk factors for pulmonary disease, and how the heart and lungs work together. Flowsheet Row PULMONARY REHAB OTHER RESPIRATORY from 01/14/2023 in [facility] for Heart, Vascular, & Lung Health  Date 01/14/23  Educator EP  Instruction Review Code 1- Verbalizes Understanding       Pulmonary Diseases Group instruction provided by PowerPoint, verbal discussion, and written material to support subject matter. Instructor gives an overview of the different type of pulmonary diseases. There is also a discussion on risk factors and symptoms as well as ways to manage the diseases.   Stress and Energy Conservation Group instruction provided by PowerPoint, verbal discussion, and written material to support subject matter. Instructor gives an overview of stress and the impact it can have on the body. Instructor also reviews ways to reduce stress. There is also a discussion on energy conservation and ways to conserve energy throughout the day.   Warning Signs and Symptoms Group instruction provided by PowerPoint, verbal discussion, and written material to support subject matter. Instructor reviews warning signs and symptoms of stroke, heart attack, cold and flu.  Instructor also reviews ways to prevent the spread of infection.   Other Education Group or individual verbal, written, or video instructions that support the educational goals of the pulmonary rehab program. Flowsheet Row PULMONARY REHAB CHRONIC OBSTRUCTIVE PULMONARY DISEASE from 05/01/2021 in [facility] for Heart, Vascular, & Lung Health  Date 04/17/21  Educator --  [facility] Plate]        Knowledge Questionnaire Score:  Knowledge Questionnaire Score - 01/08/23 8295  Knowledge Questionnaire Score   Pre Score 15/18             Core Components/Risk Factors/Patient Goals at Admission:  Personal Goals and Risk Factors at Admission - 01/08/23 0836       Core Components/Risk Factors/Patient Goals on Admission    Weight Management Yes;Weight Maintenance    Improve shortness of breath with ADL's Yes    Intervention Provide education, individualized exercise plan and daily activity instruction to help decrease symptoms of SOB with activities of daily living.    Expected Outcomes Short Term: Improve cardiorespiratory fitness to achieve a reduction of symptoms when performing ADLs;Long Term: Be able to perform more ADLs without symptoms or delay the onset of symptoms             Core Components/Risk Factors/Patient Goals Review:   Goals and Risk Factor Review     Row Name 01/19/23 0848             Core Components/Risk Factors/Patient Goals Review   Personal Goals Review Weight Management/Obesity;Improve shortness of breath with ADL's;Develop more efficient breathing techniques such as purse lipped breathing and diaphragmatic breathing and practicing self-pacing with activity.;Increase knowledge of respiratory medications and ability to use respiratory devices properly.       Review Unable to assess goals at this time. Patricia Johnston has completed 1 session so far.       Expected Outcomes For Patricia Johnston to lose weight, increase her knowledge of respiratory  medications and how to use them properly, improve her shortness of breath with ADLs, and develop more efficient breathing techniques such as purse lipped breathing and diaphragmatic breathing, learning how to self-pace with activity.                Core Components/Risk Factors/Patient Goals at Discharge (Final Review):   Goals and Risk Factor Review - 01/19/23 0848       Core Components/Risk Factors/Patient Goals Review   Personal Goals Review Weight Management/Obesity;Improve shortness of breath with ADL's;Develop more efficient breathing techniques such as purse lipped breathing and diaphragmatic breathing and practicing self-pacing with activity.;Increase knowledge of respiratory medications and ability to use respiratory devices properly.    Review Unable to assess goals at this time. Patricia Johnston has completed 1 session so far.    Expected Outcomes For Patricia Johnston to lose weight, increase her knowledge of respiratory medications and how to use them properly, improve her shortness of breath with ADLs, and develop more efficient breathing techniques such as purse lipped breathing and diaphragmatic breathing, learning how to self-pace with activity.             ITP Comments: Pt is making expected progress toward Pulmonary Rehab goals after completing 4 session(s). Recommend continued exercise, life style modification, education, and utilization of breathing techniques to increase stamina and strength, while also decreasing shortness of breath with exertion.  Dr. Mechele Collin is Medical Director for Pulmonary Rehab at [facility].

## 2023-02-02 ENCOUNTER — Encounter (HOSPITAL_COMMUNITY)
Admission: RE | Admit: 2023-02-02 | Discharge: 2023-02-02 | Disposition: A | Discharge status: Home / Self Care | Payer: No Typology Code available for payment source | Source: Ambulatory Visit | Attending: Pulmonary Disease | Admitting: Pulmonary Disease

## 2023-02-02 DIAGNOSIS — J4550 Severe persistent asthma, uncomplicated: Secondary | ICD-10-CM | POA: Insufficient documentation

## 2023-02-02 NOTE — Progress Notes (Signed)
Daily Session Note  Patient Details  Name: Patricia Johnston MRN: [id number] Date of Birth: 05/13/1950 Referring Provider:   [name] Pulmonary Rehab Walk Test from 01/08/2023 in [facility] for Heart, Vascular, & Lung Health  Referring Provider [name]       Encounter Date: 02/02/2023  Check In:  Session Check In - 02/02/23 1431       Check-In   Supervising physician immediately available to respond to emergencies [facility] MD immediately available    Physician(s) Eligha Bridegroom, NP    Location [facility]    Staff Present Raford Pitcher, MS, ACSM-CEP, Exercise Physiologist;Randi Dionisio Paschal, ACSM-CEP, Exercise Physiologist;Del Overfelt Gerre Scull, RN, Marton Redwood, MS, ACSM-CEP, CCRP, Exercise Physiologist    Virtual Visit No    Medication changes reported     No    Fall or balance concerns reported    No    Tobacco Cessation No Change    Warm-up and Cool-down Performed as group-led instruction    Resistance Training Performed Yes    VAD Patient? No    PAD/SET Patient? No      Pain Assessment   Currently in Pain? No/denies    Multiple Pain Sites No             Capillary Blood Glucose: No results found for this or any previous visit (from the past 24 hour(s)).    Social History   Tobacco Use  Smoking Status Never  Smokeless Tobacco Never    Goals Met:  Independence with exercise equipment Exercise tolerated well No report of concerns or symptoms today Strength training completed today  Goals Unmet:  Not Applicable  Comments: Service time is from 1321 to 1440    Dr. Mechele Collin is Medical Director for Pulmonary Rehab at [facility].

## 2023-02-04 ENCOUNTER — Encounter (HOSPITAL_COMMUNITY)
Admission: RE | Admit: 2023-02-04 | Discharge: 2023-02-04 | Disposition: A | Discharge status: Home / Self Care | Payer: No Typology Code available for payment source | Source: Ambulatory Visit | Attending: Pulmonary Disease | Admitting: Pulmonary Disease

## 2023-02-04 DIAGNOSIS — J4550 Severe persistent asthma, uncomplicated: Secondary | ICD-10-CM | POA: Diagnosis not present

## 2023-02-04 NOTE — Progress Notes (Signed)
Daily Session Note  Patient Details  Name: Patricia Johnston MRN: [id number] Date of Birth: 18-Oct-1950 Referring Provider:   [name] Pulmonary Rehab Walk Test from 01/08/2023 in [facility] for Heart, Vascular, & Lung Health  Referring Provider [name]       Encounter Date: 02/04/2023  Check In:  Session Check In - 02/04/23 1325       Check-In   Supervising physician immediately available to respond to emergencies [facility] MD immediately available    Physician(s) Edd Fabian, NP    [facility]    Staff Present Essie Hart, RN, BSN;Casey Katrinka Blazing, RT;Randi [facility] BS, ACSM-CEP, Exercise Physiologist;Kaylee Earlene Plater, MS, ACSM-CEP, Exercise Physiologist    Virtual Visit No    Medication changes reported     No    Fall or balance concerns reported    No    Tobacco Cessation No Change    Warm-up and Cool-down Performed as group-led instruction    Resistance Training Performed Yes    VAD Patient? No    PAD/SET Patient? No      Pain Assessment   Currently in Pain? No/denies    Multiple Pain Sites No             Capillary Blood Glucose: No results found for this or any previous visit (from the past 24 hour(s)).    Social History   Tobacco Use  Smoking Status Never  Smokeless Tobacco Never    Goals Met:  Independence with exercise equipment Exercise tolerated well No report of concerns or symptoms today Strength training completed today  Goals Unmet:  Not Applicable  Comments: Service time is from 1312 to 1455    Dr. Mechele Collin is Medical Director for Pulmonary Rehab at [facility].

## 2023-02-05 ENCOUNTER — Telehealth (HOSPITAL_COMMUNITY): Payer: Self-pay

## 2023-02-05 NOTE — Telephone Encounter (Signed)
Patient called and gace [place] the fax number for travel reimbursement forms. [phone]

## 2023-02-05 NOTE — Telephone Encounter (Signed)
Called Patricia Johnston to ask her to come to our address to sign her [employer] reimbursement forms. Left a message with call back number.

## 2023-02-05 NOTE — Telephone Encounter (Signed)
Called pt in regards to travel reimbursement sheets she is not home and will call back in 10 min.

## 2023-02-09 ENCOUNTER — Encounter (HOSPITAL_COMMUNITY)
Admission: RE | Admit: 2023-02-09 | Discharge: 2023-02-09 | Disposition: A | Discharge status: Home / Self Care | Payer: No Typology Code available for payment source | Source: Ambulatory Visit | Attending: Pulmonary Disease

## 2023-02-09 VITALS — Wt 131.6 lb

## 2023-02-09 DIAGNOSIS — J4550 Severe persistent asthma, uncomplicated: Secondary | ICD-10-CM

## 2023-02-09 NOTE — Progress Notes (Signed)
Daily Session Note  Patient Details  Name: Patricia Johnston MRN: [id number] Date of Birth: 1950/08/02 Referring Provider:   [name] Pulmonary Rehab Walk Test from 01/08/2023 in [facility] for Heart, Vascular, & Lung Health  Referring Provider [name]       Encounter Date: 02/09/2023  Check In:  Session Check In - 02/09/23 1325       Check-In   Supervising physician immediately available to respond to emergencies [facility] immediately available    Physician(s) Carlyon Shadow, NP    [facility]    Staff Present Essie Hart, RN, BSN;Casey Katrinka Blazing, RT;Randi [facility] BS, ACSM-CEP, Exercise Physiologist;Wilbert Schouten Earlene Plater, MS, ACSM-CEP, Exercise Physiologist    Virtual Visit No    Medication changes reported     No    Fall or balance concerns reported    No    Tobacco Cessation No Change    Warm-up and Cool-down Performed as group-led instruction    Resistance Training Performed Yes    VAD Patient? No    PAD/SET Patient? No      Pain Assessment   Currently in Pain? No/denies    Multiple Pain Sites No             Capillary Blood Glucose: No results found for this or any previous visit (from the past 24 hour(s)).   Exercise Prescription Changes - 02/09/23 1400       Response to Exercise   Blood Pressure (Admit) 122/64    Blood Pressure (Exercise) 150/80    Blood Pressure (Exit) 124/64    Heart Rate (Admit) 73 bpm    Heart Rate (Exercise) 98 bpm    Heart Rate (Exit) 82 bpm    Oxygen Saturation (Admit) 95 %    Oxygen Saturation (Exercise) 89 %    Oxygen Saturation (Exit) 92 %    Rating of Perceived Exertion (Exercise) 9    Perceived Dyspnea (Exercise) 1    Duration Continue with 30 min of aerobic exercise without signs/symptoms of physical distress.    Intensity THRR unchanged      Progression   Progression Continue to progress workloads to maintain intensity without signs/symptoms of physical distress.       Resistance Training   Training Prescription Yes    Weight blue bands    Reps 10-15    Time 10 Minutes      Treadmill   MPH 2.4    Grade 3    Minutes 15    METs 3.6      Recumbant Elliptical   Level 3    Minutes 15    METs 4.9             Social History   Tobacco Use  Smoking Status Never  Smokeless Tobacco Never    Goals Met:  Proper associated with RPD/PD & O2 Sat Independence with exercise equipment Exercise tolerated well No report of concerns or symptoms today Strength training completed today  Goals Unmet:  Not Applicable  Comments: Service time is from 1312 to 1436.    Dr. Mechele Collin is Medical Director for Pulmonary Rehab at [facility].

## 2023-02-11 ENCOUNTER — Encounter (HOSPITAL_COMMUNITY)
Admission: RE | Admit: 2023-02-11 | Discharge: 2023-02-11 | Disposition: A | Discharge status: Home / Self Care | Payer: No Typology Code available for payment source | Source: Ambulatory Visit | Attending: Pulmonary Disease | Admitting: Pulmonary Disease

## 2023-02-11 DIAGNOSIS — J4550 Severe persistent asthma, uncomplicated: Secondary | ICD-10-CM | POA: Diagnosis not present

## 2023-02-11 NOTE — Progress Notes (Signed)
Home Exercise Prescription I have reviewed a Home Exercise Prescription with Patricia Johnston. She is currently walking 20 min x3 a week. Congratulated pt. Discussed she could build up to 30 min as able. We also discussed looking in to an aerobic video for when the weather is hard on her lungs. She agreed. The patient stated that their goals were to maintain her airways and her fitness level. We reviewed exercise guidelines, target heart rate during exercise, RPE Scale, weather conditions, endpoints for exercise, warmup and cool down. The patient is encouraged to come to me with any questions. I will continue to follow up with the patient to assist them with progression and safety.  Spent 15 min discussing home exercise plan and goals.  Patricia Johnston, [place], ACSM-CEP 02/11/2023 2:48 PM

## 2023-02-11 NOTE — Progress Notes (Signed)
Daily Session Note  Patient Details  Name: Patricia Johnston MRN: [id number] Date of Birth: Jul 18, 1950 Referring Provider:   [name] Pulmonary Rehab Walk Test from 01/08/2023 in [facility] for Heart, Vascular, & Lung Health  Referring Provider [name]       Encounter Date: 02/11/2023  Check In:  Session Check In - 02/11/23 1322       Check-In   Supervising physician immediately available to respond to emergencies [facility] immediately available    Physician(s) Joni Reining, NP    [facility]    Staff Present Essie Hart, RN, BSN;Casey Katrinka Blazing, RT;[place] [facility] BS, ACSM-CEP, Exercise Physiologist;Kaylee Earlene Plater, MS, ACSM-CEP, Exercise Physiologist    Virtual Visit No    Medication changes reported     No    Fall or balance concerns reported    No    Tobacco Cessation No Change    Warm-up and Cool-down Performed as group-led instruction    Resistance Training Performed Yes    VAD Patient? No    PAD/SET Patient? No      Pain Assessment   Currently in Pain? No/denies    Multiple Pain Sites No             Capillary Blood Glucose: No results found for this or any previous visit (from the past 24 hours).   Exercise Prescription Changes - 02/11/23 1400       Home Exercise Plan   Plans to continue exercise at Home (comment)   walking outside or aerobic video   Frequency --   n/a   Initial Home Exercises Provided 02/11/23             Social History   Tobacco Use  Smoking Status Never  Smokeless Tobacco Never    Goals Met:  Independence with exercise equipment Exercise tolerated well No report of concerns or symptoms today Strength training completed today  Goals Unmet:  Not Applicable  Comments: Service time is from 1312 to 1440.    Dr. Mechele Collin is Medical Director for Pulmonary Rehab at [facility].

## 2023-02-16 ENCOUNTER — Encounter (HOSPITAL_COMMUNITY): Payer: No Typology Code available for payment source

## 2023-02-18 ENCOUNTER — Encounter (HOSPITAL_COMMUNITY)
Admission: RE | Admit: 2023-02-18 | Discharge: 2023-02-18 | Disposition: A | Discharge status: Home / Self Care | Payer: No Typology Code available for payment source | Source: Ambulatory Visit | Attending: Pulmonary Disease | Admitting: Pulmonary Disease

## 2023-02-18 DIAGNOSIS — J4550 Severe persistent asthma, uncomplicated: Secondary | ICD-10-CM | POA: Diagnosis not present

## 2023-02-18 NOTE — Progress Notes (Signed)
Daily Session Note  Patient Details  Name: Irona Wagenaar MRN: [id number] Date of Birth: [birth year] Referring Provider:   [name] Pulmonary Rehab Walk Test from 01/08/2023 in [facility] for Heart, Vascular, & Lung Health  Referring Provider [name]       Encounter Date: 02/18/2023  Check In:  Session Check In - 02/18/23 1338       Check-In   Supervising physician immediately available to respond to emergencies [facility] MD immediately available    Physician(s) Robin Searing, NP    [facility]    Staff Present Essie Hart, RN, BSN;Jacksen Isip Katrinka Blazing, RT;Randi [facility] BS, ACSM-CEP, Exercise Physiologist;Kaylee Earlene Plater, MS, ACSM-CEP, Exercise Physiologist    Virtual Visit No    Medication changes reported     No    Fall or balance concerns reported    No    Tobacco Cessation No Change    Warm-up and Cool-down Performed as group-led instruction    Resistance Training Performed Yes    VAD Patient? No    PAD/SET Patient? No      Pain Assessment   Currently in Pain? No/denies    Multiple Pain Sites No             Capillary Blood Glucose: No results found for this or any previous visit (from the past 24 hours).    Social History   Tobacco Use  Smoking Status Never  Smokeless Tobacco Never    Goals Met:  Proper associated with RPD/PD & O2 Sat Independence with exercise equipment Exercise tolerated well No report of concerns or symptoms today Strength training completed today  Goals Unmet:  Not Applicable  Comments: Service time is from 1308 to 1419.    Dr. Mechele Collin is Medical Director for Pulmonary Rehab at [facility].

## 2023-02-23 ENCOUNTER — Encounter (HOSPITAL_COMMUNITY)
Admission: RE | Admit: 2023-02-23 | Discharge: 2023-02-23 | Disposition: A | Discharge status: Home / Self Care | Payer: No Typology Code available for payment source | Source: Ambulatory Visit | Attending: Pulmonary Disease

## 2023-02-23 VITALS — Wt 132.1 lb

## 2023-02-23 DIAGNOSIS — J4550 Severe persistent asthma, uncomplicated: Secondary | ICD-10-CM

## 2023-02-23 NOTE — Progress Notes (Signed)
Pulmonary Individual Treatment Plan  Patient Details  Name: Patricia Johnston MRN: [id number] Date of Birth: 1950-06-08 Referring Provider:   [name] Pulmonary Rehab Walk Test from 01/08/2023 in [facility]  Referring Provider Bina  [Ellison]       Initial Encounter Date:  Flowsheet Row Pulmonary Rehab Walk Test from 01/08/2023 in [facility]  Date 01/08/23       Visit Diagnosis: Severe persistent asthma, unspecified whether complicated  Patient's Home Medications on Admission:   Current Outpatient Medications:    albuterol (PROVENTIL) (2.5 MG/3ML) 0.083% nebulizer solution, Take 2.5 mg by nebulization as needed for wheezing or shortness of breath., Disp: , Rfl:    albuterol (VENTOLIN HFA) 108 (90 Base) MCG/ACT inhaler, Inhale 2 puffs into the lungs every 4 (four) hours as needed for wheezing or shortness of breath., Disp: 1 Inhaler, Rfl: 1   alendronate (FOSAMAX) 70 MG tablet, Take 70 mg by mouth every Monday. Take with a full glass of water on an empty stomach., Disp: , Rfl:    Calcium Carb-Cholecalciferol (CALCIUM 500+D PO), Take 1 tablet by mouth daily., Disp: , Rfl:    diphenhydrAMINE (BENADRYL) 25 MG tablet, Take 25 mg by mouth every 6 (six) hours as needed (anxiety)., Disp: , Rfl:    feeding supplement, ENSURE ENLIVE, (ENSURE ENLIVE) LIQD, Take 237 mLs by mouth 2 (two) times daily between meals., Disp: 237 mL, Rfl: 12   guaiFENesin (MUCINEX) 600 MG 12 hr tablet, Take 1 tablet (600 mg total) by mouth 2 (two) times daily., Disp: 30 tablet, Rfl: 0   mometasone (ASMANEX) 220 MCG/INH inhaler, Inhale 1 puff into the lungs 2 (two) times daily., Disp: 1 each, Rfl: 11   Multiple Vitamins-Minerals (MULTIVITAMIN WITH MINERALS) tablet, Take 2 tablets by mouth daily., Disp: , Rfl:    pantoprazole (PROTONIX) 40 MG tablet, Take 1 tablet (40 mg total) by mouth daily., Disp:  90 tablet, Rfl: 3   predniSONE (DELTASONE) 20 MG tablet, 2 tabs po daily x 4 days, Disp: 8 tablet, Rfl: 0   sodium chloride HYPERTONIC 3 % nebulizer solution, INHALE 1 VIAL BY VIA NEBULIZER TWICE A DAY, Disp: , Rfl:    Tiotropium Bromide-Olodaterol 2.5-2.5 MCG/ACT AERS, Inhale 2 puffs into the lungs daily., Disp: 1 each, Rfl: 11  Current Facility-Administered Medications:    0.9 %  sodium chloride infusion, 500 mL, Intravenous, Once, Meryl Dare, MD  Past Medical History: Past Medical History:  Diagnosis Date   Anxiety    Asthma    COPD (chronic obstructive pulmonary disease) (HCC)    pt states due to [employer] service contracted COPD   Difficult intubation    Pt does not remember details over 30 years ago   Eczema    Esophageal thickening 10/13/2018   on CT- [facility]   GERD (gastroesophageal reflux disease)    Hiatal hernia    Osteoporosis    PONV (postoperative nausea and vomiting)    Pulmonary emphysema (HCC)    Pulmonary nodules    Traumatic arthritis    Umbilical hernia    Wears glasses     Tobacco Use: Social History   Tobacco Use  Smoking Status Never  Smokeless Tobacco Never    Labs: Review Flowsheet        No data to display          Capillary Blood Glucose: Lab Results  Component Value  Date   GLUCAP 99 06/16/2017     Pulmonary Assessment Scores:  Pulmonary Assessment Scores     Row Name 01/08/23 0853         ADL UCSD   ADL Phase Entry     SOB Score total 39       CAT Score   CAT Score 20       mMRC Score   mMRC Score 2             UCSD: Self-administered rating of dyspnea associated with activities of daily living (ADLs) 6-point scale (0 = "not at all" to 5 = "maximal or unable to do because of breathlessness")  Scoring Scores range from 0 to 120.  Minimally important difference is 5 units  CAT: CAT can identify the health impairment of COPD patients and is better correlated with disease  progression.  CAT has a scoring range of zero to 40. The CAT score is classified into four groups of low (less than 10), medium (10 - 20), high (21-30) and very high (31-40) based on the impact level of disease on health status. A CAT score over 10 suggests significant symptoms.  A worsening CAT score could be explained by an exacerbation, poor medication adherence, poor inhaler technique, or progression of COPD or comorbid conditions.  CAT MCID is 2 points  mMRC: mMRC (Modified Medical Research Council) Dyspnea Scale is used to assess the degree of baseline functional disability in patients of respiratory disease due to dyspnea. No minimal important difference is established. A decrease in score of 1 point or greater is considered a positive change.   Pulmonary Function Assessment:  Pulmonary Function Assessment - 01/08/23 0838       Breath   Bilateral Breath Sounds Clear;Decreased    Shortness of Breath Yes;Panic with Shortness of Breath             Exercise Target Goals: Exercise Program Goal: Individual exercise prescription set using results from initial 6 min walk test and THRR while considering  patient's activity barriers and safety.   Exercise Prescription Goal: Initial exercise prescription builds to 30-45 minutes a day of aerobic activity, 2-3 days per week.  Home exercise guidelines will be given to patient during program as part of exercise prescription that the participant will acknowledge.  Activity Barriers & Risk Stratification:  Activity Barriers & Cardiac Risk Stratification - 01/08/23 0837       Activity Barriers & Cardiac Risk Stratification   Activity Barriers Deconditioning;Muscular Weakness;Shortness of Breath    Cardiac Risk Stratification Low             6 Minute Walk:  6 Minute Walk     Row Name 01/08/23 0918         6 Minute Walk   Phase Initial     Distance 1202 feet     Walk Time 6 minutes     # of Rest Breaks 0     MPH 2.28      METS 2.69     RPE 7     Perceived Dyspnea  0.5     VO2 Peak 9.43     Symptoms No     Resting HR 64 bpm     Resting BP 112/68     Resting Oxygen Saturation  94 %     Exercise Oxygen Saturation  during 6 min walk 91 %     Max Ex. HR 85 bpm     Max Ex. BP  120/66     2 Minute Post BP 108/62       Interval HR   1 Minute HR 80     2 Minute HR 83     3 Minute HR 83     4 Minute HR 85     5 Minute HR 85     6 Minute HR 83     2 Minute Post HR 66     Interval Heart Rate? Yes       Interval Oxygen   Interval Oxygen? Yes     Baseline Oxygen Saturation % 94 %     1 Minute Oxygen Saturation % 93 %     1 Minute Liters of Oxygen 0 L     2 Minute Oxygen Saturation % 92 %     2 Minute Liters of Oxygen 0 L     3 Minute Oxygen Saturation % 91 %     3 Minute Liters of Oxygen 0 L     4 Minute Oxygen Saturation % 92 %     4 Minute Liters of Oxygen 0 L     5 Minute Oxygen Saturation % 93 %     5 Minute Liters of Oxygen 0 L     6 Minute Oxygen Saturation % 93 %     6 Minute Liters of Oxygen 0 L     2 Minute Post Oxygen Saturation % 94 %     2 Minute Post Liters of Oxygen 0 L              Oxygen Initial Assessment:  Oxygen Initial Assessment - 01/08/23 0838       Home Oxygen   Home Oxygen Device None    Sleep Oxygen Prescription None    Home Exercise Oxygen Prescription None    Home Resting Oxygen Prescription None      Initial 6 min Walk   Oxygen Used None      Program Oxygen Prescription   Program Oxygen Prescription None      Intervention   Short Term Goals To learn and understand importance of monitoring SPO2 with pulse oximeter and demonstrate accurate use of the pulse oximeter.;To learn and understand importance of maintaining oxygen saturations>88%;To learn and demonstrate proper pursed lip breathing techniques or other breathing techniques. ;To learn and demonstrate proper use of respiratory medications    Long  Term Goals Maintenance of O2 saturations>88%;Compliance  with respiratory medication;Verbalizes importance of monitoring SPO2 with pulse oximeter and return demonstration;Exhibits proper breathing techniques, such as pursed lip breathing or other method taught during program session;Demonstrates proper use of MDI's             Oxygen Re-Evaluation:  Oxygen Re-Evaluation     Row Name 01/19/23 0814 02/11/23 0843           Program Oxygen Prescription   Program Oxygen Prescription None None        Home Oxygen   Home Oxygen Device None None      Sleep Oxygen Prescription None None      Home Exercise Oxygen Prescription None None      Home Resting Oxygen Prescription None None        Goals/Expected Outcomes   Short Term Goals To learn and understand importance of monitoring SPO2 with pulse oximeter and demonstrate accurate use of the pulse oximeter.;To learn and understand importance of maintaining oxygen saturations>88%;To learn and demonstrate proper pursed lip breathing techniques or other breathing techniques. ;To  learn and demonstrate proper use of respiratory medications To learn and understand importance of monitoring SPO2 with pulse oximeter and demonstrate accurate use of the pulse oximeter.;To learn and understand importance of maintaining oxygen saturations>88%;To learn and demonstrate proper pursed lip breathing techniques or other breathing techniques. ;To learn and demonstrate proper use of respiratory medications      Long  Term Goals Maintenance of O2 saturations>88%;Compliance with respiratory medication;Verbalizes importance of monitoring SPO2 with pulse oximeter and return demonstration;Exhibits proper breathing techniques, such as pursed lip breathing or other method taught during program session;Demonstrates proper use of MDI's Maintenance of O2 saturations>88%;Compliance with respiratory medication;Verbalizes importance of monitoring SPO2 with pulse oximeter and return demonstration;Exhibits proper breathing techniques, such as  pursed lip breathing or other method taught during program session;Demonstrates proper use of MDI's      Goals/Expected Outcomes Compliance and understanding of oxygen saturation and breathing techniques to decrease shortness of breath Compliance and understanding of oxygen saturation and breathing techniques to decrease shortness of breath               Oxygen Discharge (Final Oxygen Re-Evaluation):  Oxygen Re-Evaluation - 02/11/23 0843       Program Oxygen Prescription   Program Oxygen Prescription None      Home Oxygen   Home Oxygen Device None    Sleep Oxygen Prescription None    Home Exercise Oxygen Prescription None    Home Resting Oxygen Prescription None      Goals/Expected Outcomes   Short Term Goals To learn and understand importance of monitoring SPO2 with pulse oximeter and demonstrate accurate use of the pulse oximeter.;To learn and understand importance of maintaining oxygen saturations>88%;To learn and demonstrate proper pursed lip breathing techniques or other breathing techniques. ;To learn and demonstrate proper use of respiratory medications    Long  Term Goals Maintenance of O2 saturations>88%;Compliance with respiratory medication;Verbalizes importance of monitoring SPO2 with pulse oximeter and return demonstration;Exhibits proper breathing techniques, such as pursed lip breathing or other method taught during program session;Demonstrates proper use of MDI's    Goals/Expected Outcomes Compliance and understanding of oxygen saturation and breathing techniques to decrease shortness of breath             Initial Exercise Prescription:  Initial Exercise Prescription - 01/08/23 0900       Date of Initial Exercise RX and Referring Provider   Date 01/08/23    Referring Provider [name]   Expected Discharge Date 04/08/23      Treadmill   MPH 2    Grade 2    Minutes 15    METs 3.6      Recumbant Elliptical   Level 2    RPM 60    Watts 50     Minutes 15    METs 2.5      Prescription Details   Frequency (times per week) 2    Duration Progress to 30 minutes of continuous aerobic without signs/symptoms of physical distress      Intensity   THRR 40-80% of Max Heartrate 59-118    Ratings of Perceived Exertion 11-13    Perceived Dyspnea 0-4      Progression   Progression Continue progressive overload as per policy without signs/symptoms or physical distress.      Resistance Training   Training Prescription Yes    Weight blue bands    Reps 10-15             Perform Capillary Blood Glucose  checks as needed.  Exercise Prescription Changes:   Exercise Prescription Changes     Row Name 01/26/23 1100 02/09/23 1400 02/11/23 1400 02/23/23 0900       Response to Exercise   Blood Pressure (Admit) 106/58 122/64 -- 114/56    Blood Pressure (Exercise) 154/66 150/80 -- 124/60    Blood Pressure (Exit) 120/64 124/64 -- 106/58    Heart Rate (Admit) 79 bpm 73 bpm -- 71 bpm    Heart Rate (Exercise) 105 bpm 98 bpm -- 93 bpm    Heart Rate (Exit) 91 bpm 82 bpm -- 75 bpm    Oxygen Saturation (Admit) 93 % 95 % -- 95 %    Oxygen Saturation (Exercise) 90 % 89 % -- 93 %    Oxygen Saturation (Exit) 91 % 92 % -- 94 %    Rating of Perceived Exertion (Exercise) 11 9 -- 9    Perceived Dyspnea (Exercise) 1 1 -- 1    Duration Continue with 30 min of aerobic exercise without signs/symptoms of physical distress. Continue with 30 min of aerobic exercise without signs/symptoms of physical distress. -- Continue with 30 min of aerobic exercise without signs/symptoms of physical distress.    Intensity THRR unchanged THRR unchanged -- THRR unchanged      Progression   Progression Continue to progress workloads to maintain intensity without signs/symptoms of physical distress. Continue to progress workloads to maintain intensity without signs/symptoms of physical distress. -- Continue to progress workloads to maintain intensity without signs/symptoms  of physical distress.      Resistance Training   Training Prescription Yes Yes -- Yes    Weight blue bands blue bands -- black bands    Reps 10-15 10-15 -- 10-15    Time 10 Minutes 10 Minutes -- 10 Minutes      Interval Training   Interval Training -- -- -- No      Treadmill   MPH 2.4 2.4 -- 2.4    Grade 2 3 -- 3    Minutes 15 15 -- 15    METs 3.3 3.6 -- 3.6      Recumbant Elliptical   Level 2 3 -- 3    RPM 69 -- -- --    Watts 55 -- -- --    Minutes 15 15 -- 15    METs 4.4 4.9 -- 4.5      Home Exercise Plan   Plans to continue exercise at -- -- Home (comment)  walking outside or aerobic video --    Frequency -- -- --  n/a --    Initial Home Exercises Provided -- -- 02/11/23 --             Exercise Comments:   Exercise Comments     Row Name 01/14/23 1456 02/11/23 1446         Exercise Comments Pt completed first day of group exercise. She exercised on the recumbent elliptical for 15 min, level 2, METs 3.6. She then walked on the treadmill for 15 min, 2 mph, 2 incline, METs 3.0. Tolerated well. Pt able to perform warm up and cool down, including squats. Discussed with pt METs with good reception. Discussed with pt home exercise plan. She is currently walking 20 min x3 a week. Congratulated pt. Discussed she could build up to 30 min as able. We also discussed looking in to an aerobic video for when the weather is hard on her lungs. She agreed.  Exercise Goals and Review:   Exercise Goals     Row Name 01/08/23 0837             Exercise Goals   Increase Physical Activity Yes       Intervention Provide advice, education, support and counseling about physical activity/exercise needs.;Develop an individualized exercise prescription for aerobic and resistive training based on initial evaluation findings, risk stratification, comorbidities and participant's personal goals.       Expected Outcomes Short Term: Attend rehab on a regular basis to  increase amount of physical activity.;Long Term: Add in home exercise to make exercise part of routine and to increase amount of physical activity.;Long Term: Exercising regularly at least 3-5 days a week.       Increase Strength and Stamina Yes       Intervention Provide advice, education, support and counseling about physical activity/exercise needs.;Develop an individualized exercise prescription for aerobic and resistive training based on initial evaluation findings, risk stratification, comorbidities and participant's personal goals.       Expected Outcomes Short Term: Increase workloads from initial exercise prescription for resistance, speed, and METs.;Short Term: Perform resistance training exercises routinely during rehab and add in resistance training at home;Long Term: Improve cardiorespiratory fitness, muscular endurance and strength as measured by increased METs and functional capacity ( )       Able to understand and use rate of perceived exertion (RPE) scale Yes       Intervention Provide education and explanation on how to use RPE scale       Expected Outcomes Short Term: Able to use RPE daily in rehab to express subjective intensity level;Long Term:  Able to use RPE to guide intensity level when exercising independently       Able to understand and use Dyspnea scale Yes       Intervention Provide education and explanation on how to use Dyspnea scale       Expected Outcomes Short Term: Able to use Dyspnea scale daily in rehab to express subjective sense of shortness of breath during exertion;Long Term: Able to use Dyspnea scale to guide intensity level when exercising independently       Knowledge and understanding of Target Heart Rate Range (THRR) Yes       Intervention Provide education and explanation of THRR including how the numbers were predicted and where they are located for reference       Expected Outcomes Short Term: Able to state/look up THRR;Long Term: Able to use THRR to  govern intensity when exercising independently;Short Term: Able to use daily as guideline for intensity in rehab       Understanding of Exercise Prescription Yes       Intervention Provide education, explanation, and written materials on patient's individual exercise prescription       Expected Outcomes Short Term: Able to explain program exercise prescription                Exercise Goals Re-Evaluation :  Exercise Goals Re-Evaluation     Row Name 01/19/23 0812 02/11/23 0840           Exercise Goal Re-Evaluation   Exercise Goals Review Increase Physical Activity;Able to understand and use Dyspnea scale;Understanding of Exercise Prescription;Increase Strength and Stamina;Knowledge and understanding of Target Heart Rate Range (THRR);Able to understand and use rate of perceived exertion (RPE) scale Increase Physical Activity;Able to understand and use Dyspnea scale;Understanding of Exercise Prescription;Increase Strength and Stamina;Knowledge and understanding of Target Heart Rate Range (THRR);Able  to understand and use rate of perceived exertion (RPE) scale      Comments Patricia Johnston has completed one group exercise session.She exercised on the recumbent elliptical for 15 min, level 2, METs 3.6. She then walked on the treadmill for 15 min, 2 mph, 2 incline, METs 3.0. Tolerated well. Pt able to perform warm up and cool down, including squats. She tolerated her first day well and we will progress her as tolerated. Patricia Johnston has completed 7 group exercise sessions. She is exercising on the recumbent elliptical for 15 min, level 3, METs 4.9. She is now walking on the treadmill for 15 min, 2.4 mph, 3 incline, METs 3.6.  She is motivated and progressing well. She enjoys exercise. Pt performs warm up and cool down standing, including squats. Will continue to progress as tolerated.      Expected Outcomes Through exercise at rehab and home, the patient will decrease shortness of breath with daily activities  and feel confident in carrying out an exercise regimen at home. Through exercise at rehab and home, the patient will decrease shortness of breath with daily activities and feel confident in carrying out an exercise regimen at home.               Discharge Exercise Prescription (Final Exercise Prescription Changes):  Exercise Prescription Changes - 02/23/23 0900       Response to Exercise   Blood Pressure (Admit) 114/56    Blood Pressure (Exercise) 124/60    Blood Pressure (Exit) 106/58    Heart Rate (Admit) 71 bpm    Heart Rate (Exercise) 93 bpm    Heart Rate (Exit) 75 bpm    Oxygen Saturation (Admit) 95 %    Oxygen Saturation (Exercise) 93 %    Oxygen Saturation (Exit) 94 %    Rating of Perceived Exertion (Exercise) 9    Perceived Dyspnea (Exercise) 1    Duration Continue with 30 min of aerobic exercise without signs/symptoms of physical distress.    Intensity THRR unchanged      Progression   Progression Continue to progress workloads to maintain intensity without signs/symptoms of physical distress.      Resistance Training   Training Prescription Yes    Weight black bands    Reps 10-15    Time 10 Minutes      Interval Training   Interval Training No      Treadmill   MPH 2.4    Grade 3    Minutes 15    METs 3.6      Recumbant Elliptical   Level 3    Minutes 15    METs 4.5             Nutrition:  Target Goals: Understanding of nutrition guidelines, daily intake of sodium 1500mg , cholesterol 200mg , calories 30% from fat and 7% or less from saturated fats, daily to have 5 or more servings of fruits and vegetables.  Biometrics:    Nutrition Therapy Plan and Nutrition Goals:  Nutrition Therapy & Goals - 01/14/23 1403       Nutrition Therapy   Diet General Healthy Diet      Personal Nutrition Goals   Nutrition Goal Patient to maintain diet quality by using the plate method as a guide for meal planning to include lean protein/plant protein, fruits,  vegetables, whole grains, nonfat dairy as part of a well-balanced diet.    Personal Goal #2 Patient to maintain weight throughout duration of pulmonary rehab.    Comments Patient  has medical history of COPD, bronchiectasis, acute and chronic respiratory failure, severe persistent astham. Iraima reports following a regular diet and motivation to maintain her weight. She has a history of using Ensure supplements 2x/day (220kcals, 9g protein each). She has previously completed pulmonary rehab in 2017 and most recent in March of 2023. Patient will benefit from participation in pulmonary rehab for nutrition and exercise support.      Intervention Plan   Intervention Prescribe, educate and counsel regarding individualized specific dietary modifications aiming towards targeted core components such as weight, hypertension, lipid management, diabetes, heart failure and other comorbidities.;Nutrition handout(s) given to patient.    Expected Outcomes Short Term Goal: Understand basic principles of dietary content, such as calories, fat, sodium, cholesterol and nutrients.;Long Term Goal: Adherence to prescribed nutrition plan.             Nutrition Assessments:  Nutrition Assessments - 01/19/23 1452       Rate Your Plate Scores   Pre Score 72            MEDIFICTS Score Key: >=70 Need to make dietary changes  40-70 Heart Healthy Diet <= 40 Therapeutic Level Cholesterol Diet  Flowsheet Row PULMONARY REHAB OTHER RESPIRATORY from 01/19/2023 in [facility]  Picture Your Plate Total Score on Admission 72      Picture Your Plate Scores: <40 Unhealthy dietary pattern with much room for improvement. 41-50 Dietary pattern unlikely to meet recommendations for good health and room for improvement. 51-60 More healthful dietary pattern, with some room for improvement.  >60 Healthy dietary pattern, although there may be some specific behaviors  that could be improved.    Nutrition Goals Re-Evaluation:  Nutrition Goals Re-Evaluation     Row Name 01/14/23 1403             Goals   Current Weight 129 lb 6.6 oz (58.7 kg)       Comment A1c 5.7       Expected Outcome Patient has medical history of COPD, bronchiectasis, acute and chronic respiratory failure, severe persistent astham. Patricia Johnston reports following a regular diet and motivation to maintain her weight. She has a history of using Ensure supplements 2x/day (220kcals, 9g protein each). She has previously completed pulmonary rehab in 2017 and most recent in March of 2023. Patient will benefit from participation in pulmonary rehab for nutrition and exercise support.                Nutrition Goals Discharge (Final Nutrition Goals Re-Evaluation):  Nutrition Goals Re-Evaluation - 01/14/23 1403       Goals   Current Weight 129 lb 6.6 oz (58.7 kg)    Comment A1c 5.7    Expected Outcome Patient has medical history of COPD, bronchiectasis, acute and chronic respiratory failure, severe persistent astham. Patricia Johnston reports following a regular diet and motivation to maintain her weight. She has a history of using Ensure supplements 2x/day (220kcals, 9g protein each). She has previously completed pulmonary rehab in 2017 and most recent in March of 2023. Patient will benefit from participation in pulmonary rehab for nutrition and exercise support.             Psychosocial: Target Goals: Acknowledge presence or absence of significant depression and/or stress, maximize coping skills, provide positive support system. Participant is able to verbalize types and ability to use techniques and skills needed for reducing stress and depression.  Initial Review & Psychosocial Screening:  Initial  Psych Review & Screening - 01/08/23 0832       Initial Review   Current issues with None Identified      Family Dynamics   Good Support System? Yes    Comments Pt has good support through  her family and church.      Barriers   Psychosocial barriers to participate in program There are no identifiable barriers or psychosocial needs.      Screening Interventions   Interventions Encouraged to exercise             Quality of Life Scores:  Scores of 19 and below usually indicate a poorer quality of life in these areas.  A difference of  2-3 points is a clinically meaningful difference.  A difference of 2-3 points in the total score of the Quality of Life Index has been associated with significant improvement in overall quality of life, self-image, physical symptoms, and general health in studies assessing change in quality of life.  PHQ-9: Review Flowsheet  More data may exist      01/08/2023 11/21/2021 05/29/2021 03/26/2021 09/27/2015  Depression screen PHQ 2/9  Decreased Interest 0 0 0 0 0 0  Down, Depressed, Hopeless 0 0 0 0 0 0  PHQ - 2 Score 0 0 0 0 0 0  Altered sleeping 0 - 0 0 -  Tired, decreased energy 0 - 0 1 -  Change in appetite 0 - 0 0 -  Feeling bad or failure about yourself  0 - 0 0 -  Trouble concentrating 0 - 0 0 -  Moving slowly or fidgety/restless 0 - 0 0 -  Suicidal thoughts 0 - 0 0 -  PHQ-9 Score 0 - 0 1 -  Difficult doing work/chores Not difficult at all - Not difficult at all Not difficult at all -    Details       Multiple values from one day are sorted in reverse-chronological order        Interpretation of Total Score  Total Score Depression Severity:  1-4 = Minimal depression, 5-9 = Mild depression, 10-14 = Moderate depression, 15-19 = Moderately severe depression, 20-27 = Severe depression   Psychosocial Evaluation and Intervention:  Psychosocial Evaluation - 01/08/23 0834       Psychosocial Evaluation & Interventions   Interventions Encouraged to exercise with the program and follow exercise prescription    Comments Patricia Johnston has no psychosocial concerns or barriers identified    Expected Outcomes For her to be able to  participate in the PR program with no psychosocial  issues or concerns    Continue Psychosocial Services  No Follow up required             Psychosocial Re-Evaluation:  Psychosocial Re-Evaluation     Row Name 01/19/23 0846 02/15/23 1155           Psychosocial Re-Evaluation   Current issues with None Identified None Identified      Comments At monthly re-evaluation, Patricia Johnston continues to deny any barriers or psychosocial concerns at this time At monthly re-evaluation, Patricia Johnston continues to deny any barriers or psychosocial concerns at this time      Expected Outcomes For Keesa to continue to be free of psychosocial concerns while participating in pulmonary rehab. For Danaysha to continue to be free of psychosocial concerns while participating in pulmonary rehab.      Interventions Encouraged to attend Pulmonary Rehabilitation for the exercise Encouraged to attend Pulmonary Rehabilitation for the exercise  Continue Psychosocial Services  No Follow up required No Follow up required               Psychosocial Discharge (Final Psychosocial Re-Evaluation):  Psychosocial Re-Evaluation - 02/15/23 1155       Psychosocial Re-Evaluation   Current issues with None Identified    Comments At monthly re-evaluation, Patricia Johnston continues to deny any barriers or psychosocial concerns at this time    Expected Outcomes For Patricia Johnston to continue to be free of psychosocial concerns while participating in pulmonary rehab.    Interventions Encouraged to attend Pulmonary Rehabilitation for the exercise    Continue Psychosocial Services  No Follow up required             Education: Education Goals: Education classes will be provided on a weekly basis, covering required topics. Participant will state understanding/return demonstration of topics presented.  Learning Barriers/Preferences:  Learning Barriers/Preferences - 01/08/23 0835       Learning Barriers/Preferences   Learning  Barriers Sight;Cultural/Spiritual   wears glasses, pt is Jehovah witness   Learning Preferences Skilled Demonstration;Written Material;Group Instruction;Individual Instruction             Education Topics: Know Your Numbers Group instruction that is supported by a PowerPoint presentation. Instructor discusses importance of knowing and understanding resting, exercise, and post-exercise oxygen saturation, heart rate, and blood pressure. Oxygen saturation, heart rate, blood pressure, rating of perceived exertion, and dyspnea are reviewed along with a normal range for these values.    Exercise for the Pulmonary Patient Group instruction that is supported by a PowerPoint presentation. Instructor discusses benefits of exercise, core components of exercise, frequency, duration, and intensity of an exercise routine, importance of utilizing pulse oximetry during exercise, safety while exercising, and options of places to exercise outside of rehab.  Flowsheet Row PULMONARY REHAB CHRONIC OBSTRUCTIVE PULMONARY DISEASE from 05/01/2021 in [facility], Vascular, & Lung Health  Date 04/03/21  Educator --  [Handout]       MET Level  Group instruction provided by PowerPoint, verbal discussion, and written material to support subject matter. Instructor reviews what METs are and how to increase METs.  Flowsheet Row PULMONARY REHAB OTHER RESPIRATORY from 01/21/2023 in [facility] for Heart, Vascular, & Lung Health  Date 01/21/23  Educator EP  Instruction Review Code 1- Verbalizes Understanding       Pulmonary Medications Verbally interactive group education provided by instructor with focus on inhaled medications and proper administration. Flowsheet Row PULMONARY REHAB OTHER RESPIRATORY from 02/18/2023 in [facility] for Heart, Vascular, & Lung Health  Date 02/18/23  Educator RT  Instruction Review Code 1- Verbalizes  Understanding       Anatomy and Physiology of the Respiratory System Group instruction provided by PowerPoint, verbal discussion, and written material to support subject matter. Instructor reviews respiratory cycle and anatomical components of the respiratory system and their functions. Instructor also reviews differences in obstructive and restrictive respiratory diseases with examples of each.  Flowsheet Row PULMONARY REHAB OTHER RESPIRATORY from 02/11/2023 in [facility] for Heart, Vascular, & Lung Health  Date 02/11/23  Educator RT  Instruction Review Code 1- Verbalizes Understanding       Oxygen Safety Group instruction provided by PowerPoint, verbal discussion, and written material to support subject matter. There is an overview of "What is Oxygen" and "Why do we need it".  Instructor also reviews how to create a safe environment for oxygen  use, the importance of using oxygen as prescribed, and the risks of noncompliance. There is a brief discussion on traveling with oxygen and resources the patient may utilize. Flowsheet Row PULMONARY REHAB CHRONIC OBSTRUCTIVE PULMONARY DISEASE from 02/13/2016 in [facility] for Heart, Vascular, & Lung Health  Date 01/02/16  Educator rn  Instruction Review Code (Retired) 2- meets goals/outcomes       Oxygen Use Group instruction provided by PowerPoint, verbal discussion, and written material to discuss how supplemental oxygen is prescribed and different types of oxygen supply systems. Resources for more information are provided.    Breathing Techniques Group instruction that is supported by demonstration and informational handouts. Instructor discusses the benefits of pursed lip and diaphragmatic breathing and detailed demonstration on how to perform both.     Risk Factor Reduction Group instruction that is supported by a PowerPoint presentation. Instructor discusses the definition of a risk  factor, different risk factors for pulmonary disease, and how the heart and lungs work together. Flowsheet Row PULMONARY REHAB OTHER RESPIRATORY from 01/14/2023 in [facility] for Heart, Vascular, & Lung Health  Date 01/14/23  Educator EP  Instruction Review Code 1- Verbalizes Understanding       Pulmonary Diseases Group instruction provided by PowerPoint, verbal discussion, and written material to support subject matter. Instructor gives an overview of the different type of pulmonary diseases. There is also a discussion on risk factors and symptoms as well as ways to manage the diseases. Flowsheet Row PULMONARY REHAB OTHER RESPIRATORY from 02/04/2023 in [facility] for Heart, Vascular, & Lung Health  Date 02/04/23  Educator RT  Instruction Review Code 1- Verbalizes Understanding       Stress and Energy Conservation Group instruction provided by PowerPoint, verbal discussion, and written material to support subject matter. Instructor gives an overview of stress and the impact it can have on the body. Instructor also reviews ways to reduce stress. There is also a discussion on energy conservation and ways to conserve energy throughout the day.   Warning Signs and Symptoms Group instruction provided by PowerPoint, verbal discussion, and written material to support subject matter. Instructor reviews warning signs and symptoms of stroke, heart attack, cold and flu. Instructor also reviews ways to prevent the spread of infection.   Other Education Group or individual verbal, written, or video instructions that support the educational goals of the pulmonary rehab program. Flowsheet Row PULMONARY REHAB CHRONIC OBSTRUCTIVE PULMONARY DISEASE from 05/01/2021 in [facility] for Heart, Vascular, & Lung Health  Date 04/17/21  Educator --  [facility] Plate]        Knowledge Questionnaire Score:  Knowledge Questionnaire Score -  01/08/23 1610       Knowledge Questionnaire Score   Pre Score 15/18             Core Components/Risk Factors/Patient Goals at Admission:  Personal Goals and Risk Factors at Admission - 01/08/23 0836       Core Components/Risk Factors/Patient Goals on Admission    Weight Management Yes;Weight Maintenance    Improve shortness of breath with ADL's Yes    Intervention Provide education, individualized exercise plan and daily activity instruction to help decrease symptoms of SOB with activities of daily living.    Expected Outcomes Short Term: Improve cardiorespiratory fitness to achieve a reduction of symptoms when performing ADLs;Long Term: Be able to perform more ADLs without symptoms or delay the onset of symptoms  Core Components/Risk Factors/Patient Goals Review:   Goals and Risk Factor Review     Row Name 01/19/23 0848 02/15/23 1155           Core Components/Risk Factors/Patient Goals Review   Personal Goals Review Weight Management/Obesity;Improve shortness of breath with ADL's;Develop more efficient breathing techniques such as purse lipped breathing and diaphragmatic breathing and practicing self-pacing with activity.;Increase knowledge of respiratory medications and ability to use respiratory devices properly. Weight Management/Obesity;Improve shortness of breath with ADL's;Develop more efficient breathing techniques such as purse lipped breathing and diaphragmatic breathing and practicing self-pacing with activity.;Increase knowledge of respiratory medications and ability to use respiratory devices properly.      Review Unable to assess goals at this time. Patricia Johnston has completed 1 session so far. Monthly review of patient's Core Components/Risk Factors/Patient Goals are as follows: Goal in progress for weight loss. Patricia Johnston has maintained her weight since last evaluation. She is knowledgeable of healthy eating habits and has incorporated a variety of foods into  her daily living. Goal progressing on improving shortness of breath with ADLs. Patricia Johnston has been able to exercise without the need for oxygen. She knows how to monitor her oxygen saturation on her own. Patricia Johnston has been able to increase her METs and his workload. She knows how to report her rate of exertion and dyspnea level. Goal met on developing more efficient breathing techniques such as purse lipped breathing and diaphragmatic breathing; and practicing self-pacing activities. Patricia Johnston can demonstrate purse lipped breathing and can initiate it on her own when she gets short of breath. She is also able to demonstrate diaphragmatic breathing and states she practices this at home. She can self-pace herself as needed and takes breaks at appropriate times based on her shortness of breath. Goal also met on increasing her knowledge of respiratory medications and her ability to use the devices properly. Patricia Johnston can correctly demonstrate how/when to use her inhalers with our respiratory therapists. Patricia Johnston will continue to benefit from participation in PR for nutrition, education, exercise, and lifestyle modification.      Expected Outcomes For Patricia Johnston to lose weight, increase her knowledge of respiratory medications and how to use them properly, improve her shortness of breath with ADLs, and develop more efficient breathing techniques such as purse lipped breathing and diaphragmatic breathing, learning how to self-pace with activity. For Patricia Johnston to maintain her weight, increase her knowledge of respiratory medications and how to use them properly, improve her shortness of breath with ADLs, and develop more efficient breathing techniques such as purse lipped breathing and diaphragmatic breathing, learning how to self-pace with activity.               Core Components/Risk Factors/Patient Goals at Discharge (Final Review):   Goals and Risk Factor Review - 02/15/23 1155       Core Components/Risk  Factors/Patient Goals Review   Personal Goals Review Weight Management/Obesity;Improve shortness of breath with ADL's;Develop more efficient breathing techniques such as purse lipped breathing and diaphragmatic breathing and practicing self-pacing with activity.;Increase knowledge of respiratory medications and ability to use respiratory devices properly.    Review Monthly review of patient's Core Components/Risk Factors/Patient Goals are as follows: Goal in progress for weight loss. Patricia Johnston has maintained her weight since last evaluation. She is knowledgeable of healthy eating habits and has incorporated a variety of foods into her daily living. Goal progressing on improving shortness of breath with ADLs. Samanntha has been able to exercise without the need for oxygen. She knows how to  monitor her oxygen saturation on her own. Laytona has been able to increase her METs and his workload. She knows how to report her rate of exertion and dyspnea level. Goal met on developing more efficient breathing techniques such as purse lipped breathing and diaphragmatic breathing; and practicing self-pacing activities. Shirrell can demonstrate purse lipped breathing and can initiate it on her own when she gets short of breath. She is also able to demonstrate diaphragmatic breathing and states she practices this at home. She can self-pace herself as needed and takes breaks at appropriate times based on her shortness of breath. Goal also met on increasing her knowledge of respiratory medications and her ability to use the devices properly. Mehjabeen can correctly demonstrate how/when to use her inhalers with our respiratory therapists. Demetriss will continue to benefit from participation in PR for nutrition, education, exercise, and lifestyle modification.    Expected Outcomes For Kyriah to maintain her weight, increase her knowledge of respiratory medications and how to use them properly, improve her shortness of breath with  ADLs, and develop more efficient breathing techniques such as purse lipped breathing and diaphragmatic breathing, learning how to self-pace with activity.             ITP Comments: Pt is making expected progress toward Pulmonary Rehab goals after completing 10 session(s). Recommend continued exercise, life style modification, education, and utilization of breathing techniques to increase stamina and strength, while also decreasing shortness of breath with exertion.    Comments: Dr. Mechele Collin is Medical Director for Pulmonary Rehab at [facility].

## 2023-02-23 NOTE — Progress Notes (Signed)
Daily Session Note  Patient Details  Name: Keyna Fallen MRN: [id number] Date of Birth: 01/27/1951 Referring Provider:   [name] Pulmonary Rehab Walk Test from 01/08/2023 in [facility] for Heart, Vascular, & Lung Health  Referring Provider [name]       Encounter Date: 02/23/2023  Check In:  Session Check In - 02/23/23 0824       Check-In   Supervising physician immediately available to respond to emergencies [facility] MD immediately available    Physician(s) Edd Fabian, NP    Location [facility]    Staff Present Essie Hart, RN, BSN;Casey Katrinka Blazing, RT;Randi Reeve BS, ACSM-CEP, Exercise Physiologist    Virtual Visit No    Medication changes reported     No    Fall or balance concerns reported    No    Tobacco Cessation No Change    Warm-up and Cool-down Performed as group-led instruction    Resistance Training Performed Yes    VAD Patient? No    PAD/SET Patient? No      Pain Assessment   Currently in Pain? No/denies    Multiple Pain Sites No             Capillary Blood Glucose: No results found for this or any previous visit (from the past 24 hours).   Exercise Prescription Changes - 02/23/23 0900       Response to Exercise   Blood Pressure (Admit) 114/56    Blood Pressure (Exercise) 124/60    Blood Pressure (Exit) 106/58    Heart Rate (Admit) 71 bpm    Heart Rate (Exercise) 93 bpm    Heart Rate (Exit) 75 bpm    Oxygen Saturation (Admit) 95 %    Oxygen Saturation (Exercise) 93 %    Oxygen Saturation (Exit) 94 %    Rating of Perceived Exertion (Exercise) 9    Perceived Dyspnea (Exercise) 1    Duration Continue with 30 min of aerobic exercise without signs/symptoms of physical distress.    Intensity THRR unchanged      Progression   Progression Continue to progress workloads to maintain intensity without signs/symptoms of physical distress.      Resistance Training   Training Prescription Yes     Weight black bands    Reps 10-15    Time 10 Minutes      Interval Training   Interval Training No      Treadmill   MPH 2.4    Grade 3    Minutes 15    METs 3.6      Recumbant Elliptical   Level 3    Minutes 15    METs 4.5             Social History   Tobacco Use  Smoking Status Never  Smokeless Tobacco Never    Goals Met:  Exercise tolerated well No report of concerns or symptoms today Strength training completed today  Goals Unmet:  Not Applicable  Comments: Service time is from 0811 to 0915    Dr. Mechele Collin is Medical Director for Pulmonary Rehab at [facility].

## 2023-02-25 ENCOUNTER — Encounter (HOSPITAL_COMMUNITY)
Admission: RE | Admit: 2023-02-25 | Discharge: 2023-02-25 | Disposition: A | Discharge status: Home / Self Care | Payer: No Typology Code available for payment source | Source: Ambulatory Visit | Attending: Pulmonary Disease | Admitting: Pulmonary Disease

## 2023-02-25 DIAGNOSIS — J4550 Severe persistent asthma, uncomplicated: Secondary | ICD-10-CM

## 2023-02-25 NOTE — Progress Notes (Signed)
Daily Session Note  Patient Details  Name: Patricia Johnston MRN: [id number] Date of Birth: 02-11-1951 Referring Provider:   [name] Pulmonary Rehab Walk Test from 01/08/2023 in [facility] for Heart, Vascular, & Lung Health  Referring Provider [name]       Encounter Date: 02/25/2023  Check In:  Session Check In - 02/25/23 1332       Check-In   Supervising physician immediately available to respond to emergencies [facility] MD immediately available    Physician(s) Reather Littler, NP    Location [facility]    Staff Present Essie Hart, RN, BSN;Suhaila Troiano Katrinka Blazing, RT;Randi [place] BS, ACSM-CEP, Exercise Physiologist    Virtual Visit No    Medication changes reported     No    Fall or balance concerns reported    No    Tobacco Cessation No Change    Warm-up and Cool-down Performed as group-led instruction    Resistance Training Performed Yes    VAD Patient? No    PAD/SET Patient? No      Pain Assessment   Currently in Pain? No/denies    Multiple Pain Sites No             Capillary Blood Glucose: No results found for this or any previous visit (from the past 24 hours).    Social History   Tobacco Use  Smoking Status Never  Smokeless Tobacco Never    Goals Met:  Proper associated with RPD/PD & O2 Sat Independence with exercise equipment Exercise tolerated well No report of concerns or symptoms today Strength training completed today  Goals Unmet:  Not Applicable  Comments: Service time is from 1315 to 1440.    Dr. Mechele Collin is Medical Director for Pulmonary Rehab at [facility].

## 2023-03-01 DIAGNOSIS — Z1231 Encounter for screening mammogram for malignant neoplasm of breast: Secondary | ICD-10-CM | POA: Diagnosis not present

## 2023-03-01 DIAGNOSIS — R92323 Mammographic fibroglandular density, bilateral breasts: Secondary | ICD-10-CM | POA: Diagnosis not present

## 2023-03-02 ENCOUNTER — Encounter (HOSPITAL_COMMUNITY)
Admission: RE | Admit: 2023-03-02 | Discharge: 2023-03-02 | Disposition: A | Discharge status: Home / Self Care | Payer: No Typology Code available for payment source | Source: Ambulatory Visit | Attending: Pulmonary Disease

## 2023-03-02 DIAGNOSIS — J4550 Severe persistent asthma, uncomplicated: Secondary | ICD-10-CM | POA: Diagnosis not present

## 2023-03-02 NOTE — Progress Notes (Signed)
 Daily Session Note  Patient Details  Name: Patricia Johnston MRN: [id number] Date of Birth: 1950-08-03 Referring Provider:   Conrad Ports Pulmonary Rehab Walk Test from 01/08/2023 in [facility] for Heart, Vascular, & Lung Health  Referring Provider Eber Norland       Encounter Date: 03/02/2023  Check In:  Session Check In - 03/02/23 1325       Check-In   Supervising physician immediately available to respond to emergencies [facility] MD immediately available    Physician(s) Orren Fabry, PA    Location [facility]    Staff Present Ronal Levin, RN, BSN;Casey Smith, RT;Randi [place] BS, ACSM-CEP, Exercise Physiologist    Virtual Visit No    Medication changes reported     No    Fall or balance concerns reported    No    Tobacco Cessation No Change    Warm-up and Cool-down Performed as group-led instruction    Resistance Training Performed Yes    VAD Patient? No    PAD/SET Patient? No      Pain Assessment   Currently in Pain? No/denies    Multiple Pain Sites No             Capillary Blood Glucose: No results found for this or any previous visit (from the past 24 hours).    Social History   Tobacco Use  Smoking Status Never  Smokeless Tobacco Never    Goals Met:  Exercise tolerated well No report of concerns or symptoms today Strength training completed today  Goals Unmet:  Not Applicable  Comments: Service time is from 1315 to 1435    Dr. Slater Staff is Medical Director for Pulmonary Rehab at [facility].

## 2023-03-04 ENCOUNTER — Encounter (HOSPITAL_COMMUNITY)
Admission: RE | Admit: 2023-03-04 | Discharge: 2023-03-04 | Disposition: A | Discharge status: Home / Self Care | Payer: No Typology Code available for payment source | Source: Ambulatory Visit | Attending: Pulmonary Disease | Admitting: Pulmonary Disease

## 2023-03-04 DIAGNOSIS — J449 Chronic obstructive pulmonary disease, unspecified: Secondary | ICD-10-CM | POA: Diagnosis present

## 2023-03-04 DIAGNOSIS — J4550 Severe persistent asthma, uncomplicated: Secondary | ICD-10-CM | POA: Diagnosis present

## 2023-03-04 NOTE — Progress Notes (Signed)
 Daily Session Note  Patient Details  Name: Patricia Johnston MRN: [id number] Date of Birth: [birth year] Referring Provider:   Conrad Ports Pulmonary Rehab Walk Test from 01/08/2023 in [facility] for Heart, Vascular, & Lung Health  Referring Provider Eber Norland       Encounter Date: 03/04/2023  Check In:  Session Check In - 03/04/23 1359       Check-In   Supervising physician immediately available to respond to emergencies [facility] immediately available    Physician(s) Rosaline Bane, NP    [facility]    Staff Present Ronal Levin, RN, BSN;Casey Smith, RT;Randi [place] BS, ACSM-CEP, Exercise Physiologist    Virtual Visit No    Medication changes reported     No    Fall or balance concerns reported    No    Tobacco Cessation No Change    Warm-up and Cool-down Performed as group-led instruction    Resistance Training Performed Yes    VAD Patient? No    PAD/SET Patient? No      Pain Assessment   Currently in Pain? No/denies    Multiple Pain Sites No             Capillary Blood Glucose: No results found for this or any previous visit (from the past 24 hours).    Social History   Tobacco Use  Smoking Status Never  Smokeless Tobacco Never    Goals Met:  Independence with exercise equipment Exercise tolerated well No report of concerns or symptoms today Strength training completed today  Goals Unmet: n/a   Comments: Service time is from 1318 to 1439    Dr. Slater Staff is Medical Director for Pulmonary Rehab at [facility].

## 2023-03-09 ENCOUNTER — Encounter (HOSPITAL_COMMUNITY)
Admission: RE | Admit: 2023-03-09 | Discharge: 2023-03-09 | Disposition: A | Discharge status: Home / Self Care | Payer: No Typology Code available for payment source | Source: Ambulatory Visit | Attending: Pulmonary Disease

## 2023-03-09 VITALS — Wt 130.1 lb

## 2023-03-09 DIAGNOSIS — J4550 Severe persistent asthma, uncomplicated: Secondary | ICD-10-CM

## 2023-03-09 NOTE — Progress Notes (Signed)
 Daily Session Note  Patient Details  Name: Patricia Johnston MRN: [id number] Date of Birth: [birth year] Referring Provider:   Conrad Ports Pulmonary Rehab Walk Test from 01/08/2023 in [facility] for [facility], V[facility], & [facility]  Referring Provider Eber Norland       Encounter Date: 03/09/2023  Check In:  Session Check In - 03/09/23 1326       Check-In   Supervising physician immediately available to respond to emergencies [facility] MD immediately available    Physician(s) Rosaline Bane, NP    Location [facility]    Staff Present Ronal Levin, RN, BSN;Casey Claudene, RT;[facility] BS, ACSM-CEP, Exercise Physiologist;Joetta Delprado Nicholaus, MS, ACSM-CEP, Exercise Physiologist    Virtual Visit No    Medication changes reported     No    Fall or balance concerns reported    No    Tobacco Cessation No Change    Warm-up and Cool-down Performed as group-led instruction    Resistance Training Performed Yes    VAD Patient? No    PAD/SET Patient? No      Pain Assessment   Currently in Pain? No/denies    Multiple Pain Sites No             Capillary Blood Glucose: No results found for this or any previous visit (from the past 24 hours).   Exercise Prescription Changes - 03/09/23 1400       Response to Exercise   Blood Pressure (Admit) 112/60    Blood Pressure (Exercise) 122/60    Blood Pressure (Exit) 108/60    Heart Rate (Admit) 67 bpm    Heart Rate (Exercise) 80 bpm    Heart Rate (Exit) 73 bpm    Oxygen Saturation (Admit) 985 %    Oxygen Saturation (Exercise) 95 %    Oxygen Saturation (Exit) 93 %    Rating of Perceived Exertion (Exercise) 9    Perceived Dyspnea (Exercise) 1    Duration Continue with 30 min of aerobic exercise without signs/symptoms of physical distress.    Intensity THRR unchanged      Progression   Progression Continue to progress workloads to maintain intensity without signs/symptoms of physical distress.       Resistance Training   Training Prescription Yes    Weight black bands    Reps 10-15    Time 10 Minutes      Recumbant Bike   Level 2    RPM 32    Watts 8    Minutes 15    METs 1.6      NuStep   Level 3    SPM 43    Minutes 15    METs 1.5             Social History   Tobacco Use  Smoking Status Never  Smokeless Tobacco Never    Goals Met:  Proper associated with RPD/PD & O2 Sat Exercise tolerated well No report of concerns or symptoms today Strength training completed today  Goals Unmet:  Not Applicable  Comments: Service time is from 1313 to 1435.    Dr. Slater Staff is Medical Director for Pulmonary Rehab at [facility].

## 2023-03-11 ENCOUNTER — Encounter (HOSPITAL_COMMUNITY)
Admission: RE | Admit: 2023-03-11 | Discharge: 2023-03-11 | Disposition: A | Discharge status: Home / Self Care | Payer: No Typology Code available for payment source | Source: Ambulatory Visit | Attending: Pulmonary Disease | Admitting: Pulmonary Disease

## 2023-03-11 DIAGNOSIS — J4550 Severe persistent asthma, uncomplicated: Secondary | ICD-10-CM

## 2023-03-11 NOTE — Progress Notes (Signed)
 Daily Session Note  Patient Details  Name: Patricia Johnston MRN: [id number] Date of Birth: 07-Feb-1951 Referring Provider:   Conrad Ports Pulmonary Rehab Walk Test from 01/08/2023 in [facility] for Heart, Vascular, & Lung Health  Referring Provider Eber Norland       Encounter Date: 03/11/2023  Check In:  Session Check In - 03/11/23 1321       Check-In   Supervising physician immediately available to respond to emergencies [facility] MD immediately available    Physician(s) Barnie Hila, NP    [facility]    Staff Present Ronal Levin, RN, BSN;Kazzandra Desaulniers Claudene, RT;Randi [facility] BS, ACSM-CEP, Exercise Physiologist;Kaylee Nicholaus, MS, ACSM-CEP, Exercise Physiologist    Virtual Visit No    Medication changes reported     No    Fall or balance concerns reported    No    Tobacco Cessation No Change    Warm-up and Cool-down Performed as group-led instruction    Resistance Training Performed Yes    VAD Patient? No    PAD/SET Patient? No      Pain Assessment   Currently in Pain? No/denies    Multiple Pain Sites No             Capillary Blood Glucose: No results found for this or any previous visit (from the past 24 hours).    Social History   Tobacco Use  Smoking Status Never  Smokeless Tobacco Never    Goals Met:  Proper associated with RPD/PD & O2 Sat Independence with exercise equipment Exercise tolerated well No report of concerns or symptoms today Strength training completed today  Goals Unmet:  Not Applicable  Comments: Service time is from 1309 to 1423.    Dr. Slater Staff is Medical Director for Pulmonary Rehab at [facility].

## 2023-03-16 ENCOUNTER — Encounter (HOSPITAL_COMMUNITY)
Admission: RE | Admit: 2023-03-16 | Discharge: 2023-03-16 | Disposition: A | Discharge status: Home / Self Care | Payer: No Typology Code available for payment source | Source: Ambulatory Visit | Attending: Pulmonary Disease | Admitting: Pulmonary Disease

## 2023-03-16 DIAGNOSIS — J4550 Severe persistent asthma, uncomplicated: Secondary | ICD-10-CM

## 2023-03-16 NOTE — Progress Notes (Signed)
 Daily Session Note  Patient Details  Name: Patricia Johnston MRN: [id number] Date of Birth: 21-Jun-1950 Referring Provider:   Conrad Ports Pulmonary Rehab Walk Test from 01/08/2023 in [facility] for Heart, Vascular, & Lung Health  Referring Provider Eber Norland       Encounter Date: 03/16/2023  Check In:  Session Check In - 03/16/23 1412       Check-In   Supervising physician immediately available to respond to emergencies [facility] MD immediately available    Physician(s) Lamarr Satterfield, NP    [facility]    Staff Present Ronal Levin, RN, BSN;Casey Claudene, RT;Randi [facility] BS, ACSM-CEP, Exercise Physiologist;Alawna Graybeal Nicholaus, MS, ACSM-CEP, Exercise Physiologist    Virtual Visit No    Medication changes reported     No    Fall or balance concerns reported    No    Tobacco Cessation No Change    Warm-up and Cool-down Performed as group-led instruction    Resistance Training Performed Yes    VAD Patient? No    PAD/SET Patient? No      Pain Assessment   Currently in Pain? No/denies    Multiple Pain Sites No             Capillary Blood Glucose: No results found for this or any previous visit (from the past 24 hours).    Social History   Tobacco Use  Smoking Status Never  Smokeless Tobacco Never    Goals Met:  Proper associated with RPD/PD & O2 Sat Exercise tolerated well No report of concerns or symptoms today Strength training completed today  Goals Unmet:  Not Applicable  Comments: Service time is from 1316 to 1432.    Dr. Slater Staff is Medical Director for Pulmonary Rehab at [facility].

## 2023-03-18 ENCOUNTER — Encounter (HOSPITAL_COMMUNITY)
Admission: RE | Admit: 2023-03-18 | Discharge: 2023-03-18 | Disposition: A | Discharge status: Home / Self Care | Payer: No Typology Code available for payment source | Source: Ambulatory Visit | Attending: Pulmonary Disease | Admitting: Pulmonary Disease

## 2023-03-18 DIAGNOSIS — J4550 Severe persistent asthma, uncomplicated: Secondary | ICD-10-CM | POA: Diagnosis not present

## 2023-03-18 NOTE — Progress Notes (Signed)
Daily Session Note  Patient Details  Name: Patricia Johnston MRN: [id number] Date of Birth: Nov 09, 1950 Referring Provider:   [name] Pulmonary Rehab Walk Test from 01/08/2023 in [facility] for [facility], Vascular, & Lung Health  Referring Provider [name]       Encounter Date: 03/18/2023  Check In:  Session Check In - 03/18/23 1346       Check-In   Supervising physician immediately available to respond to emergencies [facility] MD immediately available    Physician(s) Joni Reining, NP    Location [facility]    Staff Present Essie Hart, RN, BSN;Casey Katrinka Blazing, RT;Randi [name] BS, ACSM-CEP, Exercise Physiologist;Kaylee Earlene Plater, MS, ACSM-CEP, Exercise Physiologist    Virtual Visit No    Medication changes reported     No    Fall or balance concerns reported    No    Tobacco Cessation No Change    Warm-up and Cool-down Performed as group-led instruction    Resistance Training Performed Yes    VAD Patient? No    PAD/SET Patient? No      Pain Assessment   Currently in Pain? No/denies    Multiple Pain Sites No             Capillary Blood Glucose: No results found for this or any previous visit (from the past 24 hours).    Social History   Tobacco Use  Smoking Status Never  Smokeless Tobacco Never    Goals Met:  Exercise tolerated well No report of concerns or symptoms today Strength training completed today  Goals Unmet:  Not Applicable  Comments: Service time is from 1317 to 1434    Dr. Mechele Collin is Medical Director for Pulmonary Rehab at [facility].

## 2023-03-23 ENCOUNTER — Encounter (HOSPITAL_COMMUNITY)
Admission: RE | Admit: 2023-03-23 | Discharge: 2023-03-23 | Disposition: A | Discharge status: Home / Self Care | Payer: No Typology Code available for payment source | Source: Ambulatory Visit | Attending: Pulmonary Disease

## 2023-03-23 VITALS — Wt 128.5 lb

## 2023-03-23 DIAGNOSIS — J4550 Severe persistent asthma, uncomplicated: Secondary | ICD-10-CM | POA: Diagnosis not present

## 2023-03-23 DIAGNOSIS — J449 Chronic obstructive pulmonary disease, unspecified: Secondary | ICD-10-CM

## 2023-03-23 NOTE — Progress Notes (Signed)
Daily Session Note  Patient Details  Name: Patricia Johnston MRN: [id number] Date of Birth: Apr 09, 1950 Referring Provider:   [name] Pulmonary Rehab Walk Test from 01/08/2023 in [facility] for [facility], Vascular, & Lung [facility]  Referring Provider [name]       Encounter Date: 03/23/2023  Check In:  Session Check In - 03/23/23 1323       Check-In   Supervising physician immediately available to respond to emergencies [facility] MD immediately available    Physician(s) Bernadene Person, NP    Location [facility]    Staff Present Essie Hart, RN, BSN;Casey Katrinka Blazing, Zella Richer, MS, ACSM-CEP, Exercise Physiologist;Johnny Hale Bogus, MS, Exercise Physiologist    Virtual Visit No    Medication changes reported     No    Fall or balance concerns reported    No    Tobacco Cessation No Change    Warm-up and Cool-down Performed as group-led instruction    Resistance Training Performed Yes    VAD Patient? No    PAD/SET Patient? No      Pain Assessment   Currently in Pain? No/denies    Multiple Pain Sites No             Capillary Blood Glucose: No results found for this or any previous visit (from the past 24 hours).   Exercise Prescription Changes - 03/23/23 1400       Response to Exercise   Blood Pressure (Admit) 108/66    Blood Pressure (Exercise) 162/66    Blood Pressure (Exit) 98/56    Heart Rate (Admit) 73 bpm    Heart Rate (Exercise) 100 bpm    Heart Rate (Exit) 63 bpm    Oxygen Saturation (Admit) 94 %    Oxygen Saturation (Exercise) 93 %    Oxygen Saturation (Exit) 92 %    Rating of Perceived Exertion (Exercise) 13    Perceived Dyspnea (Exercise) 3    Duration Continue with 30 min of aerobic exercise without signs/symptoms of physical distress.    Intensity THRR unchanged      Progression   Progression Continue to progress workloads to maintain intensity without signs/symptoms of physical distress.      Resistance  Training   Training Prescription Yes    Weight black bands    Reps 10-15    Time 10 Minutes      Interval Training   Interval Training No      Treadmill   MPH 2.6    Grade 3    Minutes 15    METs 4.07      Recumbant Elliptical   Level 5    Minutes 15    METs 6             Social History   Tobacco Use  Smoking Status Never  Smokeless Tobacco Never    Goals Met:  Exercise tolerated well No report of concerns or symptoms today Strength training completed today  Goals Unmet:  Not Applicable  Comments: Service time is from 1317 to 1433    Dr. Mechele Collin is Medical Director for Pulmonary Rehab at [facility].

## 2023-03-24 NOTE — Progress Notes (Signed)
Pulmonary Individual Treatment Plan  Patient Details  Name: Patricia Johnston MRN: [id number] Date of Birth: May 07, 1950 Referring Provider:   [name] Pulmonary Rehab Walk Test from 01/08/2023 in [facility]  Referring Provider Bina  [Ellison]       Initial Encounter Date:  Flowsheet Row Pulmonary Rehab Walk Test from 01/08/2023 in [facility]  Date 01/08/23       Visit Diagnosis: Severe persistent asthma, unspecified whether complicated  Chronic obstructive pulmonary disease, unspecified COPD type (HCC)  Patient's Home Medications on Admission:   Current Outpatient Medications:    albuterol (PROVENTIL) (2.5 MG/3ML) 0.083% nebulizer solution, Take 2.5 mg by nebulization as needed for wheezing or shortness of breath., Disp: , Rfl:    albuterol (VENTOLIN HFA) 108 (90 Base) MCG/ACT inhaler, Inhale 2 puffs into the lungs every 4 (four) hours as needed for wheezing or shortness of breath., Disp: 1 Inhaler, Rfl: 1   alendronate (FOSAMAX) 70 MG tablet, Take 70 mg by mouth every Monday. Take with a full glass of water on an empty stomach., Disp: , Rfl:    Calcium Carb-Cholecalciferol (CALCIUM 500+D PO), Take 1 tablet by mouth daily., Disp: , Rfl:    diphenhydrAMINE (BENADRYL) 25 MG tablet, Take 25 mg by mouth every 6 (six) hours as needed (anxiety)., Disp: , Rfl:    feeding supplement, ENSURE ENLIVE, (ENSURE ENLIVE) LIQD, Take 237 mLs by mouth 2 (two) times daily between meals., Disp: 237 mL, Rfl: 12   guaiFENesin (MUCINEX) 600 MG 12 hr tablet, Take 1 tablet (600 mg total) by mouth 2 (two) times daily., Disp: 30 tablet, Rfl: 0   mometasone (ASMANEX) 220 MCG/INH inhaler, Inhale 1 puff into the lungs 2 (two) times daily., Disp: 1 each, Rfl: 11   Multiple Vitamins-Minerals (MULTIVITAMIN WITH MINERALS) tablet, Take 2 tablets by mouth daily., Disp: , Rfl:    pantoprazole  (PROTONIX) 40 MG tablet, Take 1 tablet (40 mg total) by mouth daily., Disp: 90 tablet, Rfl: 3   predniSONE (DELTASONE) 20 MG tablet, 2 tabs po daily x 4 days, Disp: 8 tablet, Rfl: 0   sodium chloride HYPERTONIC 3 % nebulizer solution, INHALE 1 VIAL BY VIA NEBULIZER TWICE A DAY, Disp: , Rfl:    Tiotropium Bromide-Olodaterol 2.5-2.5 MCG/ACT AERS, Inhale 2 puffs into the lungs daily., Disp: 1 each, Rfl: 11  Current Facility-Administered Medications:    0.9 %  sodium chloride infusion, 500 mL, Intravenous, Once, Meryl Dare, MD  Past Medical History: Past Medical History:  Diagnosis Date   Anxiety    Asthma    COPD (chronic obstructive pulmonary disease) (HCC)    pt states due to [employer] service contracted COPD   Difficult intubation    Pt does not remember details over 30 years ago   Eczema    Esophageal thickening 10/13/2018   on CT- [facility]   GERD (gastroesophageal reflux disease)    Hiatal hernia    Osteoporosis    PONV (postoperative nausea and vomiting)    Pulmonary emphysema (HCC)    Pulmonary nodules    Traumatic arthritis    Umbilical hernia    Wears glasses     Tobacco Use: Social History   Tobacco Use  Smoking Status Never  Smokeless Tobacco Never    Labs: Review Flowsheet        No data to display  Capillary Blood Glucose: Lab Results  Component Value Date   GLUCAP 99 06/16/2017     Pulmonary Assessment Scores:  Pulmonary Assessment Scores     Row Name 01/08/23 0853         ADL UCSD   ADL Phase Entry     SOB Score total 39       CAT Score   CAT Score 20       mMRC Score   mMRC Score 2             UCSD: Self-administered rating of dyspnea associated with activities of daily living (ADLs) 6-point scale (0 = "not at all" to 5 = "maximal or unable to do because of breathlessness")  Scoring Scores range from 0 to 120.  Minimally important difference is 5 units  CAT: CAT can identify the health  impairment of COPD patients and is better correlated with disease progression.  CAT has a scoring range of zero to 40. The CAT score is classified into four groups of low (less than 10), medium (10 - 20), high (21-30) and very high (31-40) based on the impact level of disease on health status. A CAT score over 10 suggests significant symptoms.  A worsening CAT score could be explained by an exacerbation, poor medication adherence, poor inhaler technique, or progression of COPD or comorbid conditions.  CAT MCID is 2 points  mMRC: mMRC (Modified Medical Research Council) Dyspnea Scale is used to assess the degree of baseline functional disability in patients of respiratory disease due to dyspnea. No minimal important difference is established. A decrease in score of 1 point or greater is considered a positive change.   Pulmonary Function Assessment:  Pulmonary Function Assessment - 01/08/23 0838       Breath   Bilateral Breath Sounds Clear;Decreased    Shortness of Breath Yes;Panic with Shortness of Breath             Exercise Target Goals: Exercise Program Goal: Individual exercise prescription set using results from initial 6 min walk test and THRR while considering  patient's activity barriers and safety.   Exercise Prescription Goal: Initial exercise prescription builds to 30-45 minutes a day of aerobic activity, 2-3 days per week.  Home exercise guidelines will be given to patient during program as part of exercise prescription that the participant will acknowledge.  Activity Barriers & Risk Stratification:  Activity Barriers & Cardiac Risk Stratification - 01/08/23 0837       Activity Barriers & Cardiac Risk Stratification   Activity Barriers Deconditioning;Muscular Weakness;Shortness of Breath    Cardiac Risk Stratification Low             6 Minute Walk:  6 Minute Walk     Row Name 01/08/23 0918         6 Minute Walk   Phase Initial     Distance 1202 feet      Walk Time 6 minutes     # of Rest Breaks 0     MPH 2.28     METS 2.69     RPE 7     Perceived Dyspnea  0.5     VO2 Peak 9.43     Symptoms No     Resting HR 64 bpm     Resting BP 112/68     Resting Oxygen Saturation  94 %     Exercise Oxygen Saturation  during 6 min walk 91 %     Max Ex. HR 85  bpm     Max Ex. BP 120/66     2 Minute Post BP 108/62       Interval HR   1 Minute HR 80     2 Minute HR 83     3 Minute HR 83     4 Minute HR 85     5 Minute HR 85     6 Minute HR 83     2 Minute Post HR 66     Interval Heart Rate? Yes       Interval Oxygen   Interval Oxygen? Yes     Baseline Oxygen Saturation % 94 %     1 Minute Oxygen Saturation % 93 %     1 Minute Liters of Oxygen 0 L     2 Minute Oxygen Saturation % 92 %     2 Minute Liters of Oxygen 0 L     3 Minute Oxygen Saturation % 91 %     3 Minute Liters of Oxygen 0 L     4 Minute Oxygen Saturation % 92 %     4 Minute Liters of Oxygen 0 L     5 Minute Oxygen Saturation % 93 %     5 Minute Liters of Oxygen 0 L     6 Minute Oxygen Saturation % 93 %     6 Minute Liters of Oxygen 0 L     2 Minute Post Oxygen Saturation % 94 %     2 Minute Post Liters of Oxygen 0 L              Oxygen Initial Assessment:  Oxygen Initial Assessment - 01/08/23 0838       Home Oxygen   Home Oxygen Device None    Sleep Oxygen Prescription None    Home Exercise Oxygen Prescription None    Home Resting Oxygen Prescription None      Initial 6 min Walk   Oxygen Used None      Program Oxygen Prescription   Program Oxygen Prescription None      Intervention   Short Term Goals To learn and understand importance of monitoring SPO2 with pulse oximeter and demonstrate accurate use of the pulse oximeter.;To learn and understand importance of maintaining oxygen saturations>88%;To learn and demonstrate proper pursed lip breathing techniques or other breathing techniques. ;To learn and demonstrate proper use of respiratory medications     Long  Term Goals Maintenance of O2 saturations>88%;Compliance with respiratory medication;Verbalizes importance of monitoring SPO2 with pulse oximeter and return demonstration;Exhibits proper breathing techniques, such as pursed lip breathing or other method taught during program session;Demonstrates proper use of MDI's             Oxygen Re-Evaluation:  Oxygen Re-Evaluation     Row Name 01/19/23 0814 02/11/23 0843 03/16/23 0836         Program Oxygen Prescription   Program Oxygen Prescription None None None       Home Oxygen   Home Oxygen Device None None None     Sleep Oxygen Prescription None None None     Home Exercise Oxygen Prescription None None None     Home Resting Oxygen Prescription None None None       Goals/Expected Outcomes   Short Term Goals To learn and understand importance of monitoring SPO2 with pulse oximeter and demonstrate accurate use of the pulse oximeter.;To learn and understand importance of maintaining oxygen saturations>88%;To learn and demonstrate proper pursed  lip breathing techniques or other breathing techniques. ;To learn and demonstrate proper use of respiratory medications To learn and understand importance of monitoring SPO2 with pulse oximeter and demonstrate accurate use of the pulse oximeter.;To learn and understand importance of maintaining oxygen saturations>88%;To learn and demonstrate proper pursed lip breathing techniques or other breathing techniques. ;To learn and demonstrate proper use of respiratory medications To learn and understand importance of monitoring SPO2 with pulse oximeter and demonstrate accurate use of the pulse oximeter.;To learn and understand importance of maintaining oxygen saturations>88%;To learn and demonstrate proper pursed lip breathing techniques or other breathing techniques. ;To learn and demonstrate proper use of respiratory medications     Long  Term Goals Maintenance of O2 saturations>88%;Compliance with respiratory  medication;Verbalizes importance of monitoring SPO2 with pulse oximeter and return demonstration;Exhibits proper breathing techniques, such as pursed lip breathing or other method taught during program session;Demonstrates proper use of MDI's Maintenance of O2 saturations>88%;Compliance with respiratory medication;Verbalizes importance of monitoring SPO2 with pulse oximeter and return demonstration;Exhibits proper breathing techniques, such as pursed lip breathing or other method taught during program session;Demonstrates proper use of MDI's Maintenance of O2 saturations>88%;Compliance with respiratory medication;Verbalizes importance of monitoring SPO2 with pulse oximeter and return demonstration;Exhibits proper breathing techniques, such as pursed lip breathing or other method taught during program session;Demonstrates proper use of MDI's     Goals/Expected Outcomes Compliance and understanding of oxygen saturation and breathing techniques to decrease shortness of breath Compliance and understanding of oxygen saturation and breathing techniques to decrease shortness of breath Compliance and understanding of oxygen saturation and breathing techniques to decrease shortness of breath              Oxygen Discharge (Final Oxygen Re-Evaluation):  Oxygen Re-Evaluation - 03/16/23 0836       Program Oxygen Prescription   Program Oxygen Prescription None      Home Oxygen   Home Oxygen Device None    Sleep Oxygen Prescription None    Home Exercise Oxygen Prescription None    Home Resting Oxygen Prescription None      Goals/Expected Outcomes   Short Term Goals To learn and understand importance of monitoring SPO2 with pulse oximeter and demonstrate accurate use of the pulse oximeter.;To learn and understand importance of maintaining oxygen saturations>88%;To learn and demonstrate proper pursed lip breathing techniques or other breathing techniques. ;To learn and demonstrate proper use of respiratory  medications    Long  Term Goals Maintenance of O2 saturations>88%;Compliance with respiratory medication;Verbalizes importance of monitoring SPO2 with pulse oximeter and return demonstration;Exhibits proper breathing techniques, such as pursed lip breathing or other method taught during program session;Demonstrates proper use of MDI's    Goals/Expected Outcomes Compliance and understanding of oxygen saturation and breathing techniques to decrease shortness of breath             Initial Exercise Prescription:  Initial Exercise Prescription - 01/08/23 0900       Date of Initial Exercise RX and Referring Provider   Date 01/08/23    Referring Provider [name]   Expected Discharge Date 04/08/23      Treadmill   MPH 2    Grade 2    Minutes 15    METs 3.6      Recumbant Elliptical   Level 2    RPM 60    Watts 50    Minutes 15    METs 2.5      Prescription Details   Frequency (times per week) 2  Duration Progress to 30 minutes of continuous aerobic without signs/symptoms of physical distress      Intensity   THRR 40-80% of Max Heartrate 59-118    Ratings of Perceived Exertion 11-13    Perceived Dyspnea 0-4      Progression   Progression Continue progressive overload as per policy without signs/symptoms or physical distress.      Resistance Training   Training Prescription Yes    Weight blue bands    Reps 10-15             Perform Capillary Blood Glucose checks as needed.  Exercise Prescription Changes:   Exercise Prescription Changes     Row Name 01/26/23 1100 02/09/23 1400 02/11/23 1400 02/23/23 0900 03/09/23 1400     Response to Exercise   Blood Pressure (Admit) 106/58 122/64 -- 114/56 112/60   Blood Pressure (Exercise) 154/66 150/80 -- 124/60 122/60   Blood Pressure (Exit) 120/64 124/64 -- 106/58 108/60   Heart Rate (Admit) 79 bpm 73 bpm -- 71 bpm 67 bpm   Heart Rate (Exercise) 105 bpm 98 bpm -- 93 bpm 80 bpm   Heart Rate (Exit) 91 bpm 82 bpm  -- 75 bpm 73 bpm   Oxygen Saturation (Admit) 93 % 95 % -- 95 % 985 %   Oxygen Saturation (Exercise) 90 % 89 % -- 93 % 95 %   Oxygen Saturation (Exit) 91 % 92 % -- 94 % 93 %   Rating of Perceived Exertion (Exercise) 11 9 -- 9 9   Perceived Dyspnea (Exercise) 1 1 -- 1 1   Duration Continue with 30 min of aerobic exercise without signs/symptoms of physical distress. Continue with 30 min of aerobic exercise without signs/symptoms of physical distress. -- Continue with 30 min of aerobic exercise without signs/symptoms of physical distress. Continue with 30 min of aerobic exercise without signs/symptoms of physical distress.   Intensity THRR unchanged THRR unchanged -- THRR unchanged THRR unchanged     Progression   Progression Continue to progress workloads to maintain intensity without signs/symptoms of physical distress. Continue to progress workloads to maintain intensity without signs/symptoms of physical distress. -- Continue to progress workloads to maintain intensity without signs/symptoms of physical distress. Continue to progress workloads to maintain intensity without signs/symptoms of physical distress.     Resistance Training   Training Prescription Yes Yes -- Yes Yes   Weight blue bands blue bands -- black bands black bands   Reps 10-15 10-15 -- 10-15 10-15   Time 10 Minutes 10 Minutes -- 10 Minutes 10 Minutes     Interval Training   Interval Training -- -- -- No --     Treadmill   MPH 2.4 2.4 -- 2.4 --   Grade 2 3 -- 3 --   Minutes 15 15 -- 15 --   METs 3.3 3.6 -- 3.6 --     Recumbant Bike   Level -- -- -- -- 2   RPM -- -- -- -- 32   Watts -- -- -- -- 8   Minutes -- -- -- -- 15   METs -- -- -- -- 1.6     NuStep   Level -- -- -- -- 3   SPM -- -- -- -- 43   Minutes -- -- -- -- 15   METs -- -- -- -- 1.5     Recumbant Elliptical   Level 2 3 -- 3 --   RPM 69 -- -- -- --   Grace Isaac  55 -- -- -- --   Minutes 15 15 -- 15 --   METs 4.4 4.9 -- 4.5 --     Home Exercise Plan    Plans to continue exercise at -- -- Home (comment)  walking outside or aerobic video -- --   Frequency -- -- --  n/a -- --   Initial Home Exercises Provided -- -- 02/11/23 -- --    Row Name 03/23/23 1400             Response to Exercise   Blood Pressure (Admit) 108/66       Blood Pressure (Exercise) 162/66       Blood Pressure (Exit) 98/56       Heart Rate (Admit) 73 bpm       Heart Rate (Exercise) 100 bpm       Heart Rate (Exit) 63 bpm       Oxygen Saturation (Admit) 94 %       Oxygen Saturation (Exercise) 93 %       Oxygen Saturation (Exit) 92 %       Rating of Perceived Exertion (Exercise) 13       Perceived Dyspnea (Exercise) 3       Duration Continue with 30 min of aerobic exercise without signs/symptoms of physical distress.       Intensity THRR unchanged         Progression   Progression Continue to progress workloads to maintain intensity without signs/symptoms of physical distress.         Resistance Training   Training Prescription Yes       Weight black bands       Reps 10-15       Time 10 Minutes         Interval Training   Interval Training No         Treadmill   MPH 2.6       Grade 3       Minutes 15       METs 4.07         Recumbant Elliptical   Level 5       Minutes 15       METs 6                Exercise Comments:   Exercise Comments     Row Name 01/14/23 1456 02/11/23 1446         Exercise Comments Pt completed first day of group exercise. She exercised on the recumbent elliptical for 15 min, level 2, METs 3.6. She then walked on the treadmill for 15 min, 2 mph, 2 incline, METs 3.0. Tolerated well. Pt able to perform warm up and cool down, including squats. Discussed with pt METs with good reception. Discussed with pt home exercise plan. She is currently walking 20 min x3 a week. Congratulated pt. Discussed she could build up to 30 min as able. We also discussed looking in to an aerobic video for when the weather is hard on her lungs.  She agreed.               Exercise Goals and Review:   Exercise Goals     Row Name 01/08/23 0837             Exercise Goals   Increase Physical Activity Yes       Intervention Provide advice, education, support and counseling about physical activity/exercise needs.;Develop an individualized exercise prescription for aerobic and resistive training  based on initial evaluation findings, risk stratification, comorbidities and participant's personal goals.       Expected Outcomes Short Term: Attend rehab on a regular basis to increase amount of physical activity.;Long Term: Add in home exercise to make exercise part of routine and to increase amount of physical activity.;Long Term: Exercising regularly at least 3-5 days a week.       Increase Strength and Stamina Yes       Intervention Provide advice, education, support and counseling about physical activity/exercise needs.;Develop an individualized exercise prescription for aerobic and resistive training based on initial evaluation findings, risk stratification, comorbidities and participant's personal goals.       Expected Outcomes Short Term: Increase workloads from initial exercise prescription for resistance, speed, and METs.;Short Term: Perform resistance training exercises routinely during rehab and add in resistance training at home;Long Term: Improve cardiorespiratory fitness, muscular endurance and strength as measured by increased METs and functional capacity ( )       Able to understand and use rate of perceived exertion (RPE) scale Yes       Intervention Provide education and explanation on how to use RPE scale       Expected Outcomes Short Term: Able to use RPE daily in rehab to express subjective intensity level;Long Term:  Able to use RPE to guide intensity level when exercising independently       Able to understand and use Dyspnea scale Yes       Intervention Provide education and explanation on how to use Dyspnea scale        Expected Outcomes Short Term: Able to use Dyspnea scale daily in rehab to express subjective sense of shortness of breath during exertion;Long Term: Able to use Dyspnea scale to guide intensity level when exercising independently       Knowledge and understanding of Target Heart Rate Range (THRR) Yes       Intervention Provide education and explanation of THRR including how the numbers were predicted and where they are located for reference       Expected Outcomes Short Term: Able to state/look up THRR;Long Term: Able to use THRR to govern intensity when exercising independently;Short Term: Able to use daily as guideline for intensity in rehab       Understanding of Exercise Prescription Yes       Intervention Provide education, explanation, and written materials on patient's individual exercise prescription       Expected Outcomes Short Term: Able to explain program exercise prescription                Exercise Goals Re-Evaluation :  Exercise Goals Re-Evaluation     Row Name 01/19/23 0812 02/11/23 0840 03/16/23 0831         Exercise Goal Re-Evaluation   Exercise Goals Review Increase Physical Activity;Able to understand and use Dyspnea scale;Understanding of Exercise Prescription;Increase Strength and Stamina;Knowledge and understanding of Target Heart Rate Range (THRR);Able to understand and use rate of perceived exertion (RPE) scale Increase Physical Activity;Able to understand and use Dyspnea scale;Understanding of Exercise Prescription;Increase Strength and Stamina;Knowledge and understanding of Target Heart Rate Range (THRR);Able to understand and use rate of perceived exertion (RPE) scale Increase Physical Activity;Able to understand and use Dyspnea scale;Understanding of Exercise Prescription;Increase Strength and Stamina;Knowledge and understanding of Target Heart Rate Range (THRR);Able to understand and use rate of perceived exertion (RPE) scale     Comments Jalayah has  completed one group exercise session.She exercised on the recumbent elliptical for 15  min, level 2, METs 3.6. She then walked on the treadmill for 15 min, 2 mph, 2 incline, METs 3.0. Tolerated well. Pt able to perform warm up and cool down, including squats. She tolerated her first day well and we will progress her as tolerated. Deilyn has completed 7 group exercise sessions. She is exercising on the recumbent elliptical for 15 min, level 3, METs 4.9. She is now walking on the treadmill for 15 min, 2.4 mph, 3 incline, METs 3.6.  She is motivated and progressing well. She enjoys exercise. Pt performs warm up and cool down standing, including squats. Will continue to progress as tolerated. Shameria has completed 15 group exercise sessions. She recently has had vertigo therefore needed to change equipment and decrease her intensity. She is exercising on the recumbent stepper for 15 min, level 3, METs 2.9. She then is exercising on the recumbent bike for 15 min, level 3, METs 2.5. She will return to original equipment once her vertigo is resolved. She is using 7.3 lb black bands for resistance training.     Expected Outcomes Through exercise at rehab and home, the patient will decrease shortness of breath with daily activities and feel confident in carrying out an exercise regimen at home. Through exercise at rehab and home, the patient will decrease shortness of breath with daily activities and feel confident in carrying out an exercise regimen at home. Through exercise at rehab and home, the patient will decrease shortness of breath with daily activities and feel confident in carrying out an exercise regimen at home.              Discharge Exercise Prescription (Final Exercise Prescription Changes):  Exercise Prescription Changes - 03/23/23 1400       Response to Exercise   Blood Pressure (Admit) 108/66    Blood Pressure (Exercise) 162/66    Blood Pressure (Exit) 98/56    Heart Rate (Admit) 73 bpm     Heart Rate (Exercise) 100 bpm    Heart Rate (Exit) 63 bpm    Oxygen Saturation (Admit) 94 %    Oxygen Saturation (Exercise) 93 %    Oxygen Saturation (Exit) 92 %    Rating of Perceived Exertion (Exercise) 13    Perceived Dyspnea (Exercise) 3    Duration Continue with 30 min of aerobic exercise without signs/symptoms of physical distress.    Intensity THRR unchanged      Progression   Progression Continue to progress workloads to maintain intensity without signs/symptoms of physical distress.      Resistance Training   Training Prescription Yes    Weight black bands    Reps 10-15    Time 10 Minutes      Interval Training   Interval Training No      Treadmill   MPH 2.6    Grade 3    Minutes 15    METs 4.07      Recumbant Elliptical   Level 5    Minutes 15    METs 6             Nutrition:  Target Goals: Understanding of nutrition guidelines, daily intake of sodium 1500mg , cholesterol 200mg , calories 30% from fat and 7% or less from saturated fats, daily to have 5 or more servings of fruits and vegetables.  Biometrics:    Nutrition Therapy Plan and Nutrition Goals:  Nutrition Therapy & Goals - 01/14/23 1403       Nutrition Therapy   Diet General Healthy Diet  Personal Nutrition Goals   Nutrition Goal Patient to maintain diet quality by using the plate method as a guide for meal planning to include lean protein/plant protein, fruits, vegetables, whole grains, nonfat dairy as part of a well-balanced diet.    Personal Goal #2 Patient to maintain weight throughout duration of pulmonary rehab.    Comments Patient has medical history of COPD, bronchiectasis, acute and chronic respiratory failure, severe persistent astham. Simiyah reports following a regular diet and motivation to maintain her weight. She has a history of using Ensure supplements 2x/day (220kcals, 9g protein each). She has previously completed pulmonary rehab in 2017 and most recent in March of  2023. Patient will benefit from participation in pulmonary rehab for nutrition and exercise support.      Intervention Plan   Intervention Prescribe, educate and counsel regarding individualized specific dietary modifications aiming towards targeted core components such as weight, hypertension, lipid management, diabetes, heart failure and other comorbidities.;Nutrition handout(s) given to patient.    Expected Outcomes Short Term Goal: Understand basic principles of dietary content, such as calories, fat, sodium, cholesterol and nutrients.;Long Term Goal: Adherence to prescribed nutrition plan.             Nutrition Assessments:  Nutrition Assessments - 01/19/23 1452       Rate Your Plate Scores   Pre Score 72            MEDIFICTS Score Key: >=70 Need to make dietary changes  40-70 Heart Healthy Diet <= 40 Therapeutic Level Cholesterol Diet  Flowsheet Row PULMONARY REHAB OTHER RESPIRATORY from 01/19/2023 in [facility] for Heart, Vascular, & Lung Health  Picture Your Plate Total Score on Admission 72      Picture Your Plate Scores: <82 Unhealthy dietary pattern with much room for improvement. 41-50 Dietary pattern unlikely to meet recommendations for good health and room for improvement. 51-60 More healthful dietary pattern, with some room for improvement.  >60 Healthy dietary pattern, although there may be some specific behaviors that could be improved.    Nutrition Goals Re-Evaluation:  Nutrition Goals Re-Evaluation     Row Name 01/14/23 1403             Goals   Current Weight 129 lb 6.6 oz (58.7 kg)       Comment A1c 5.7       Expected Outcome Patient has medical history of COPD, bronchiectasis, acute and chronic respiratory failure, severe persistent astham. Dorita reports following a regular diet and motivation to maintain her weight. She has a history of using Ensure supplements 2x/day (220kcals, 9g protein each). She has previously  completed pulmonary rehab in 2017 and most recent in March of 2023. Patient will benefit from participation in pulmonary rehab for nutrition and exercise support.                Nutrition Goals Discharge (Final Nutrition Goals Re-Evaluation):  Nutrition Goals Re-Evaluation - 01/14/23 1403       Goals   Current Weight 129 lb 6.6 oz (58.7 kg)    Comment A1c 5.7    Expected Outcome Patient has medical history of COPD, bronchiectasis, acute and chronic respiratory failure, severe persistent astham. Larrisa reports following a regular diet and motivation to maintain her weight. She has a history of using Ensure supplements 2x/day (220kcals, 9g protein each). She has previously completed pulmonary rehab in 2017 and most recent in March of 2023. Patient will benefit from participation in pulmonary rehab for nutrition  and exercise support.             Psychosocial: Target Goals: Acknowledge presence or absence of significant depression and/or stress, maximize coping skills, provide positive support system. Participant is able to verbalize types and ability to use techniques and skills needed for reducing stress and depression.  Initial Review & Psychosocial Screening:  Initial Psych Review & Screening - 01/08/23 0832       Initial Review   Current issues with None Identified      Family Dynamics   Good Support System? Yes    Comments Pt has good support through her family and church.      Barriers   Psychosocial barriers to participate in program There are no identifiable barriers or psychosocial needs.      Screening Interventions   Interventions Encouraged to exercise             Quality of Life Scores:  Scores of 19 and below usually indicate a poorer quality of life in these areas.  A difference of  2-3 points is a clinically meaningful difference.  A difference of 2-3 points in the total score of the Quality of Life Index has been associated with significant  improvement in overall quality of life, self-image, physical symptoms, and general health in studies assessing change in quality of life.  PHQ-9: Review Flowsheet  More data may exist      01/08/2023 11/21/2021 05/29/2021 03/26/2021 09/27/2015  Depression screen PHQ 2/9  Decreased Interest 0 0 0 0 0 0  Down, Depressed, Hopeless 0 0 0 0 0 0  PHQ - 2 Score 0 0 0 0 0 0  Altered sleeping 0 - 0 0 -  Tired, decreased energy 0 - 0 1 -  Change in appetite 0 - 0 0 -  Feeling bad or failure about yourself  0 - 0 0 -  Trouble concentrating 0 - 0 0 -  Moving slowly or fidgety/restless 0 - 0 0 -  Suicidal thoughts 0 - 0 0 -  PHQ-9 Score 0 - 0 1 -  Difficult doing work/chores Not difficult at all - Not difficult at all Not difficult at all -    Details       Multiple values from one day are sorted in reverse-chronological order        Interpretation of Total Score  Total Score Depression Severity:  1-4 = Minimal depression, 5-9 = Mild depression, 10-14 = Moderate depression, 15-19 = Moderately severe depression, 20-27 = Severe depression   Psychosocial Evaluation and Intervention:  Psychosocial Evaluation - 01/08/23 0834       Psychosocial Evaluation & Interventions   Interventions Encouraged to exercise with the program and follow exercise prescription    Comments Leath has no psychosocial concerns or barriers identified    Expected Outcomes For her to be able to participate in the PR program with no psychosocial  issues or concerns    Continue Psychosocial Services  No Follow up required             Psychosocial Re-Evaluation:  Psychosocial Re-Evaluation     Row Name 01/19/23 0846 02/15/23 1155 03/15/23 0908         Psychosocial Re-Evaluation   Current issues with None Identified None Identified None Identified     Comments At monthly re-evaluation, Mariaclara continues to deny any barriers or psychosocial concerns at this time At monthly re-evaluation, Jamillah continues to  deny any barriers or psychosocial concerns at this time  At monthly re-evaluation, Tempress continues to deny any barriers or psychosocial concerns at this time     Expected Outcomes For Falisa to continue to be free of psychosocial concerns while participating in pulmonary rehab. For Nisha to continue to be free of psychosocial concerns while participating in pulmonary rehab. For Argiro to continue to be free of psychosocial concerns while participating in pulmonary rehab.     Interventions Encouraged to attend Pulmonary Rehabilitation for the exercise Encouraged to attend Pulmonary Rehabilitation for the exercise Encouraged to attend Pulmonary Rehabilitation for the exercise     Continue Psychosocial Services  No Follow up required No Follow up required No Follow up required              Psychosocial Discharge (Final Psychosocial Re-Evaluation):  Psychosocial Re-Evaluation - 03/15/23 0908       Psychosocial Re-Evaluation   Current issues with None Identified    Comments At monthly re-evaluation, Stuart continues to deny any barriers or psychosocial concerns at this time    Expected Outcomes For Robin to continue to be free of psychosocial concerns while participating in pulmonary rehab.    Interventions Encouraged to attend Pulmonary Rehabilitation for the exercise    Continue Psychosocial Services  No Follow up required             Education: Education Goals: Education classes will be provided on a weekly basis, covering required topics. Participant will state understanding/return demonstration of topics presented.  Learning Barriers/Preferences:  Learning Barriers/Preferences - 01/08/23 0835       Learning Barriers/Preferences   Learning Barriers Sight;Cultural/Spiritual   wears glasses, pt is Jehovah witness   Learning Preferences Skilled Demonstration;Written Material;Group Instruction;Individual Instruction             Education Topics: Know Your  Numbers Group instruction that is supported by a PowerPoint presentation. Instructor discusses importance of knowing and understanding resting, exercise, and post-exercise oxygen saturation, heart rate, and blood pressure. Oxygen saturation, heart rate, blood pressure, rating of perceived exertion, and dyspnea are reviewed along with a normal range for these values.  Flowsheet Row PULMONARY REHAB OTHER RESPIRATORY from 03/04/2023 in [facility] for Heart, Vascular, & Lung Health  Date 03/04/23  Educator EP  Instruction Review Code 1- Verbalizes Understanding       Exercise for the Pulmonary Patient Group instruction that is supported by a PowerPoint presentation. Instructor discusses benefits of exercise, core components of exercise, frequency, duration, and intensity of an exercise routine, importance of utilizing pulse oximetry during exercise, safety while exercising, and options of places to exercise outside of rehab.  Flowsheet Row PULMONARY REHAB OTHER RESPIRATORY from 02/25/2023 in [facility] for Heart, Vascular, & Lung Health  Date 02/25/23  Educator EP  Instruction Review Code 1- Verbalizes Understanding       MET Level  Group instruction provided by PowerPoint, verbal discussion, and written material to support subject matter. Instructor reviews what METs are and how to increase METs.  Flowsheet Row PULMONARY REHAB OTHER RESPIRATORY from 01/21/2023 in [facility] for Heart, Vascular, & Lung Health  Date 01/21/23  Educator EP  Instruction Review Code 1- Verbalizes Understanding       Pulmonary Medications Verbally interactive group education provided by instructor with focus on inhaled medications and proper administration. Flowsheet Row PULMONARY REHAB OTHER RESPIRATORY from 02/18/2023 in [facility] for Heart, Vascular, & Lung Health  Date 02/18/23  Educator RT  Instruction  Review Code 1- Chiropodist and Physiology of the Respiratory System Group instruction provided by PowerPoint, verbal discussion, and written material to support subject matter. Instructor reviews respiratory cycle and anatomical components of the respiratory system and their functions. Instructor also reviews differences in obstructive and restrictive respiratory diseases with examples of each.  Flowsheet Row PULMONARY REHAB OTHER RESPIRATORY from 02/11/2023 in [facility] for Heart, Vascular, & Lung Health  Date 02/11/23  Educator RT  Instruction Review Code 1- Verbalizes Understanding       Oxygen Safety Group instruction provided by PowerPoint, verbal discussion, and written material to support subject matter. There is an overview of "What is Oxygen" and "Why do we need it".  Instructor also reviews how to create a safe environment for oxygen use, the importance of using oxygen as prescribed, and the risks of noncompliance. There is a brief discussion on traveling with oxygen and resources the patient may utilize. Flowsheet Row PULMONARY REHAB OTHER RESPIRATORY from 03/11/2023 in [facility] for Heart, Vascular, & Lung Health  Date 03/11/23  Educator RN  Instruction Review Code 1- Verbalizes Understanding       Oxygen Use Group instruction provided by PowerPoint, verbal discussion, and written material to discuss how supplemental oxygen is prescribed and different types of oxygen supply systems. Resources for more information are provided.  Flowsheet Row PULMONARY REHAB OTHER RESPIRATORY from 03/18/2023 in [facility] for Heart, Vascular, & Lung Health  Date 03/18/23  Educator RT  Instruction Review Code 1- Verbalizes Understanding       Breathing Techniques Group instruction that is supported by demonstration and informational handouts. Instructor discusses the benefits of pursed  lip and diaphragmatic breathing and detailed demonstration on how to perform both.     Risk Factor Reduction Group instruction that is supported by a PowerPoint presentation. Instructor discusses the definition of a risk factor, different risk factors for pulmonary disease, and how the heart and lungs work together. Flowsheet Row PULMONARY REHAB OTHER RESPIRATORY from 01/14/2023 in [facility] for Heart, Vascular, & Lung Health  Date 01/14/23  Educator EP  Instruction Review Code 1- Verbalizes Understanding       Pulmonary Diseases Group instruction provided by PowerPoint, verbal discussion, and written material to support subject matter. Instructor gives an overview of the different type of pulmonary diseases. There is also a discussion on risk factors and symptoms as well as ways to manage the diseases. Flowsheet Row PULMONARY REHAB OTHER RESPIRATORY from 02/04/2023 in [facility] for Heart, Vascular, & Lung Health  Date 02/04/23  Educator RT  Instruction Review Code 1- Verbalizes Understanding       Stress and Energy Conservation Group instruction provided by PowerPoint, verbal discussion, and written material to support subject matter. Instructor gives an overview of stress and the impact it can have on the body. Instructor also reviews ways to reduce stress. There is also a discussion on energy conservation and ways to conserve energy throughout the day.   Warning Signs and Symptoms Group instruction provided by PowerPoint, verbal discussion, and written material to support subject matter. Instructor reviews warning signs and symptoms of stroke, heart attack, cold and flu. Instructor also reviews ways to prevent the spread of infection.   Other Education Group or individual verbal, written, or video instructions that support the educational goals of the pulmonary rehab program. Flowsheet Row PULMONARY REHAB CHRONIC OBSTRUCTIVE  PULMONARY DISEASE from 05/01/2021 in [facility] for Heart, Vascular, & Lung Health  Date 04/17/21  Educator --  [facility] Plate]        Knowledge Questionnaire Score:  Knowledge Questionnaire Score - 01/08/23 4098       Knowledge Questionnaire Score   Pre Score 15/18             Core Components/Risk Factors/Patient Goals at Admission:  Personal Goals and Risk Factors at Admission - 01/08/23 0836       Core Components/Risk Factors/Patient Goals on Admission    Weight Management Yes;Weight Maintenance    Improve shortness of breath with ADL's Yes    Intervention Provide education, individualized exercise plan and daily activity instruction to help decrease symptoms of SOB with activities of daily living.    Expected Outcomes Short Term: Improve cardiorespiratory fitness to achieve a reduction of symptoms when performing ADLs;Long Term: Be able to perform more ADLs without symptoms or delay the onset of symptoms             Core Components/Risk Factors/Patient Goals Review:   Goals and Risk Factor Review     Row Name 01/19/23 0848 02/15/23 1155 03/15/23 0908         Core Components/Risk Factors/Patient Goals Review   Personal Goals Review Weight Management/Obesity;Improve shortness of breath with ADL's;Develop more efficient breathing techniques such as purse lipped breathing and diaphragmatic breathing and practicing self-pacing with activity.;Increase knowledge of respiratory medications and ability to use respiratory devices properly. Weight Management/Obesity;Improve shortness of breath with ADL's;Develop more efficient breathing techniques such as purse lipped breathing and diaphragmatic breathing and practicing self-pacing with activity.;Increase knowledge of respiratory medications and ability to use respiratory devices properly. Weight Management/Obesity;Improve shortness of breath with ADL's     Review Unable to assess goals at this time. Chantra  has completed 1 session so far. Monthly review of patient's Core Components/Risk Factors/Patient Goals are as follows: Goal in progress for weight loss. Allicia has maintained her weight since last evaluation. She is knowledgeable of healthy eating habits and has incorporated a variety of foods into her daily living. Goal progressing on improving shortness of breath with ADLs. Jaquese has been able to exercise without the need for oxygen. She knows how to monitor her oxygen saturation on her own. Donene has been able to increase her METs and his workload. She knows how to report her rate of exertion and dyspnea level. Goal met on developing more efficient breathing techniques such as purse lipped breathing and diaphragmatic breathing; and practicing self-pacing activities. Zoriah can demonstrate purse lipped breathing and can initiate it on her own when she gets short of breath. She is also able to demonstrate diaphragmatic breathing and states she practices this at home. She can self-pace herself as needed and takes breaks at appropriate times based on her shortness of breath. Goal also met on increasing her knowledge of respiratory medications and her ability to use the devices properly. Shanaz can correctly demonstrate how/when to use her inhalers with our respiratory therapists. Anola will continue to benefit from participation in PR for nutrition, education, exercise, and lifestyle modification. Monthly review of patient's Core Components/Risk Factors/Patient Goals are as follows: Goal in progress for weight maintenance. Adriena has maintained her weight since last evaluation. She is knowledgeable of healthy eating habits and has incorporated a variety of foods into her daily living. Goal progressing on improving shortness of breath with ADLs. Lanaysia has been able to exercise without the need  for oxygen. She knows how to monitor her oxygen saturation on her own. Alaze has been able to increase  her METs and his workload. She knows how to report her rate of exertion and dyspnea level. Marijose will continue to benefit from participation in PR for nutrition, education, exercise, and lifestyle modification.     Expected Outcomes For Wanda to lose weight, increase her knowledge of respiratory medications and how to use them properly, improve her shortness of breath with ADLs, and develop more efficient breathing techniques such as purse lipped breathing and diaphragmatic breathing, learning how to self-pace with activity. For Claris Che to maintain her weight, increase her knowledge of respiratory medications and how to use them properly, improve her shortness of breath with ADLs, and develop more efficient breathing techniques such as purse lipped breathing and diaphragmatic breathing, learning how to self-pace with activity. For Claris Che to maintain her weight and improve her shortness of breath with ADLs              Core Components/Risk Factors/Patient Goals at Discharge (Final Review):   Goals and Risk Factor Review - 03/15/23 0908       Core Components/Risk Factors/Patient Goals Review   Personal Goals Review Weight Management/Obesity;Improve shortness of breath with ADL's    Review Monthly review of patient's Core Components/Risk Factors/Patient Goals are as follows: Goal in progress for weight maintenance. Ahtziry has maintained her weight since last evaluation. She is knowledgeable of healthy eating habits and has incorporated a variety of foods into her daily living. Goal progressing on improving shortness of breath with ADLs. Varshini has been able to exercise without the need for oxygen. She knows how to monitor her oxygen saturation on her own. Floriene has been able to increase her METs and his workload. She knows how to report her rate of exertion and dyspnea level. Dedrea will continue to benefit from participation in PR for nutrition, education, exercise, and lifestyle  modification.    Expected Outcomes For Marianthi to maintain her weight and improve her shortness of breath with ADLs             ITP Comments:Pt is making expected progress toward Pulmonary Rehab goals after completing 18 session(s). Recommend continued exercise, life style modification, education, and utilization of breathing techniques to increase stamina and strength, while also decreasing shortness of breath with exertion.  Dr. Mechele Collin is Medical Director for Pulmonary Rehab at [facility].  [phone]

## 2023-03-25 ENCOUNTER — Encounter (HOSPITAL_COMMUNITY)
Admission: RE | Admit: 2023-03-25 | Discharge: 2023-03-25 | Disposition: A | Discharge status: Home / Self Care | Payer: No Typology Code available for payment source | Source: Ambulatory Visit | Attending: Pulmonary Disease | Admitting: Pulmonary Disease

## 2023-03-25 DIAGNOSIS — J4550 Severe persistent asthma, uncomplicated: Secondary | ICD-10-CM

## 2023-03-25 NOTE — Progress Notes (Signed)
Daily Session Note  Patient Details  Name: Patricia Johnston MRN: [id number] Date of Birth: [birth year] Referring Provider:   [name] Pulmonary Rehab Walk Test from 01/08/2023 in [facility] for Heart, Vascular, & Lung Health  Referring Provider [name]       Encounter Date: 03/25/2023  Check In:  Session Check In - 03/25/23 1349       Check-In   Supervising physician immediately available to respond to emergencies [facility] MD immediately available    Physician(s) Ranae Pila, NP    Location [facility]    Staff Present Essie Hart, RN, BSN;Casey Charlean Sanfilippo, MS, ACSM-CEP, Exercise Physiologist    Virtual Visit No    Medication changes reported     No    Fall or balance concerns reported    No    Tobacco Cessation No Change    Warm-up and Cool-down Performed as group-led instruction    Resistance Training Performed Yes    VAD Patient? No    PAD/SET Patient? No      Pain Assessment   Currently in Pain? No/denies    Multiple Pain Sites No             Capillary Blood Glucose: No results found for this or any previous visit (from the past 24 hours).    Social History   Tobacco Use  Smoking Status Never  Smokeless Tobacco Never    Goals Met:  Exercise tolerated well No report of concerns or symptoms today Strength training completed today  Goals Unmet:  Not Applicable  Comments: Service time is from 1316 to 1437    Dr. Mechele Collin is Medical Director for Pulmonary Rehab at [facility].

## 2023-03-30 ENCOUNTER — Encounter (HOSPITAL_COMMUNITY)
Admission: RE | Admit: 2023-03-30 | Discharge: 2023-03-30 | Disposition: A | Discharge status: Home / Self Care | Payer: No Typology Code available for payment source | Source: Ambulatory Visit | Attending: Pulmonary Disease | Admitting: Pulmonary Disease

## 2023-03-30 DIAGNOSIS — J4550 Severe persistent asthma, uncomplicated: Secondary | ICD-10-CM

## 2023-03-30 DIAGNOSIS — J449 Chronic obstructive pulmonary disease, unspecified: Secondary | ICD-10-CM

## 2023-03-30 NOTE — Progress Notes (Signed)
Daily Session Note  Patient Details  Name: Patricia Johnston MRN: [id number] Date of Birth: 26-May-1950 Referring Provider:   [name] Pulmonary Rehab Walk Test from 01/08/2023 in [facility] for Heart, Vascular, & Lung Health  Referring Provider [name]       Encounter Date: 03/30/2023  Check In:  Session Check In - 03/30/23 1351       Check-In   Supervising physician immediately available to respond to emergencies [facility] MD immediately available    Physician(s) Tereso Newcomer, PA-C    Location [facility]    Staff Present Essie Hart, RN, BSN;Fields Oros Katrinka Blazing, Zella Richer, MS, ACSM-CEP, Exercise Physiologist;David Manus Gunning, MS, ACSM-CEP, CCRP, Exercise Physiologist    Virtual Visit No    Medication changes reported     No    Fall or balance concerns reported    No    Tobacco Cessation No Change    Warm-up and Cool-down Performed as group-led instruction    Resistance Training Performed Yes    VAD Patient? No    PAD/SET Patient? No      Pain Assessment   Currently in Pain? No/denies    Multiple Pain Sites No             Capillary Blood Glucose: No results found for this or any previous visit (from the past 24 hours).    Social History   Tobacco Use  Smoking Status Never  Smokeless Tobacco Never    Goals Met:  Proper associated with RPD/PD & O2 Sat Independence with exercise equipment Exercise tolerated well No report of concerns or symptoms today Strength training completed today  Goals Unmet:  Not Applicable  Comments: Service time is from 1314 to 1412.    Dr. Mechele Collin is Medical Director for Pulmonary Rehab at [facility].

## 2023-04-01 ENCOUNTER — Encounter (HOSPITAL_COMMUNITY): Admission: RE | Admit: 2023-04-01 | Payer: No Typology Code available for payment source | Source: Ambulatory Visit

## 2023-04-06 ENCOUNTER — Encounter (HOSPITAL_COMMUNITY)
Admission: RE | Admit: 2023-04-06 | Discharge: 2023-04-06 | Disposition: A | Discharge status: Home / Self Care | Payer: No Typology Code available for payment source | Source: Ambulatory Visit | Attending: Pulmonary Disease | Admitting: Pulmonary Disease

## 2023-04-06 VITALS — Wt 129.0 lb

## 2023-04-06 DIAGNOSIS — J4550 Severe persistent asthma, uncomplicated: Secondary | ICD-10-CM | POA: Insufficient documentation

## 2023-04-06 NOTE — Progress Notes (Signed)
Daily Session Note  Patient Details  Name: Patricia Johnston MRN: [id number] Date of Birth: 1951/01/02 Referring Provider:   [name] Pulmonary Rehab Walk Test from 01/08/2023 in [facility]  Referring Provider [name]       Encounter Date: 04/06/2023  Check In:  Session Check In - 04/06/23 1332       Check-In   Supervising physician immediately available to respond to emergencies [facility] MD immediately available    Physician(s) Edd Fabian, NP    Location [facility]    Staff Present Essie Hart, RN, BSN;Roe Koffman Katrinka Blazing, Zella Richer, MS, ACSM-CEP, Exercise Physiologist;Randi Idelle Crouch BS, ACSM-CEP, Exercise Physiologist    Virtual Visit No    Medication changes reported     No    Fall or balance concerns reported    No    Tobacco Cessation No Change    Warm-up and Cool-down Performed as group-led instruction    Resistance Training Performed Yes    VAD Patient? No    PAD/SET Patient? No      Pain Assessment   Currently in Pain? No/denies    Multiple Pain Sites No             Capillary Blood Glucose: No results found for this or any previous visit (from the past 24 hours).   Exercise Prescription Changes - 04/06/23 1500       Response to Exercise   Blood Pressure (Admit) 102/52    Blood Pressure (Exercise) 142/70    Blood Pressure (Exit) 118/58    Heart Rate (Admit) 62 bpm    Heart Rate (Exercise) 103 bpm    Heart Rate (Exit) 79 bpm    Oxygen Saturation (Admit) 95 %    Oxygen Saturation (Exercise) 92 %    Oxygen Saturation (Exit) 94 %    Rating of Perceived Exertion (Exercise) 13    Perceived Dyspnea (Exercise) 2    Duration Continue with 30 min of aerobic exercise without signs/symptoms of physical distress.    Intensity THRR unchanged      Resistance Training   Training Prescription Yes    Weight black bands    Reps 10-15    Time 10 Minutes      Treadmill   MPH 2.9     Grade 3    Minutes 15    METs 4.2      Recumbant Elliptical   Level 5    Minutes 15    METs 4.7             Social History   Tobacco Use  Smoking Status Never  Smokeless Tobacco Never    Goals Met:  Proper associated with RPD/PD & O2 Sat Independence with exercise equipment Exercise tolerated well No report of concerns or symptoms today Strength training completed today  Goals Unmet:  Not Applicable  Comments: Service time is from 1308 to 1440.    Dr. Mechele Collin is Medical Director for Pulmonary Rehab at [facility].

## 2023-04-08 ENCOUNTER — Encounter (HOSPITAL_COMMUNITY)
Admission: RE | Admit: 2023-04-08 | Discharge: 2023-04-08 | Disposition: A | Discharge status: Home / Self Care | Payer: No Typology Code available for payment source | Source: Ambulatory Visit | Attending: Pulmonary Disease | Admitting: Pulmonary Disease

## 2023-04-08 DIAGNOSIS — J4550 Severe persistent asthma, uncomplicated: Secondary | ICD-10-CM | POA: Diagnosis not present

## 2023-04-08 NOTE — Progress Notes (Signed)
 Daily Session Note  Patient Details  Name: Patricia Johnston MRN: [id number] Date of Birth: 1950-10-23 Referring Provider:   Conrad Ports Pulmonary Rehab Walk Test from 01/08/2023 in [facility]  Referring Provider Eber Norland       Encounter Date: 04/08/2023  Check In:  Session Check In - 04/08/23 1326       Check-In   Supervising physician immediately available to respond to emergencies [facility] MD immediately available    Physician(s) Rosabel Mose, NP    Location [facility]    Staff Present Ronal Levin, RN, BSN;Casey Claudene, Neita Moats, MS, ACSM-CEP, Exercise Physiologist;Randi Midge BS, ACSM-CEP, Exercise Physiologist    Virtual Visit No    Medication changes reported     No    Fall or balance concerns reported    No    Tobacco Cessation No Change    Warm-up and Cool-down Performed as group-led instruction    Resistance Training Performed Yes    VAD Patient? No    PAD/SET Patient? No      Pain Assessment   Currently in Pain? No/denies    Multiple Pain Sites No             Capillary Blood Glucose: No results found for this or any previous visit (from the past 24 hours).    Social History   Tobacco Use  Smoking Status Never  Smokeless Tobacco Never    Goals Met:  Proper associated with RPD/PD & O2 Sat Exercise tolerated well No report of concerns or symptoms today Strength training completed today  Goals Unmet:  Not Applicable  Comments: Service time is from 1318 to 1400.    Dr. Slater Staff is Medical Director for Pulmonary Rehab at [facility].

## 2023-04-09 NOTE — Progress Notes (Signed)
 Discharge Progress Report  Patient Details  Name: Monda Chastain MRN: [id number] Date of Birth: [birth year] Referring Provider:   Conrad Ports Pulmonary Rehab Walk Test from 01/08/2023 in [facility] for Heart, Vascular, & Lung Health  Referring Provider Bina  [Ellison]        Number of Visits: 22  Reason for Discharge:  Patient reached a stable level of exercise. Patient independent in their exercise. Patient has met program and personal goals.  Smoking History:  Social History   Tobacco Use  Smoking Status Never  Smokeless Tobacco Never    Diagnosis:  Severe persistent asthma, unspecified whether complicated  ADL UCSD:  Pulmonary Assessment Scores     Row Name 01/08/23 0853 04/08/23 1518       ADL UCSD   ADL Phase Entry Exit    SOB Score total 39 27      CAT Score   CAT Score 20 11      mMRC Score   mMRC Score 2 2             Initial Exercise Prescription:  Initial Exercise Prescription - 01/08/23 0900       Date of Initial Exercise RX and Referring Provider   Date 01/08/23    Referring Provider Eber Staff   Expected Discharge Date 04/08/23      Treadmill   MPH 2    Grade 2    Minutes 15    METs 3.6      Recumbant Elliptical   Level 2    RPM 60    Watts 50    Minutes 15    METs 2.5      Prescription Details   Frequency (times per week) 2    Duration Progress to 30 minutes of continuous aerobic without signs/symptoms of physical distress      Intensity   THRR 40-80% of Max Heartrate 59-118    Ratings of Perceived Exertion 11-13    Perceived Dyspnea 0-4      Progression   Progression Continue progressive overload as per policy without signs/symptoms or physical distress.      Resistance Training   Training Prescription Yes    Weight blue bands    Reps 10-15             Discharge Exercise Prescription (Final Exercise Prescription Changes):  Exercise Prescription Changes - 04/06/23 1500        Response to Exercise   Blood Pressure (Admit) 102/52    Blood Pressure (Exercise) 142/70    Blood Pressure (Exit) 118/58    Heart Rate (Admit) 62 bpm    Heart Rate (Exercise) 103 bpm    Heart Rate (Exit) 79 bpm    Oxygen Saturation (Admit) 95 %    Oxygen Saturation (Exercise) 92 %    Oxygen Saturation (Exit) 94 %    Rating of Perceived Exertion (Exercise) 13    Perceived Dyspnea (Exercise) 2    Duration Continue with 30 min of aerobic exercise without signs/symptoms of physical distress.    Intensity THRR unchanged      Resistance Training   Training Prescription Yes    Weight black bands    Reps 10-15    Time 10 Minutes      Treadmill   MPH 2.9    Grade 3    Minutes 15    METs 4.2      Recumbant Elliptical   Level 5    Minutes 15  METs 4.7             Functional Capacity:  6 Minute Walk     Row Name 01/08/23 0918 04/08/23 1522       6 Minute Walk   Phase Initial Discharge    Distance 1202 feet 1860 feet    Distance % Change -- 54.7 %    Distance Feet Change -- 658 ft    Walk Time 6 minutes 6 minutes    # of Rest Breaks 0 0    MPH 2.28 3.52    METS 2.69 4.36    RPE 7 12    Perceived Dyspnea  0.5 1    VO2 Peak 9.43 15.09    Symptoms No No    Resting HR 64 bpm 65 bpm    Resting BP 112/68 110/66    Resting Oxygen Saturation  94 % 96 %    Exercise Oxygen Saturation  during 6 min walk 91 % 91 %    Max Ex. HR 85 bpm 101 bpm    Max Ex. BP 120/66 164/68    2 Minute Post BP 108/62 140/60      Interval HR   1 Minute HR 80 78    2 Minute HR 83 95    3 Minute HR 83 92    4 Minute HR 85 101    5 Minute HR 85 101    6 Minute HR 83 101    2 Minute Post HR 66 65    Interval Heart Rate? Yes Yes      Interval Oxygen   Interval Oxygen? Yes --    Baseline Oxygen Saturation % 94 % 96 %    1 Minute Oxygen Saturation % 93 % 94 %    1 Minute Liters of Oxygen 0 L 0 L    2 Minute Oxygen Saturation % 92 % 96 %    2 Minute Liters of Oxygen 0 L 0 L    3  Minute Oxygen Saturation % 91 % 91 %    3 Minute Liters of Oxygen 0 L 0 L    4 Minute Oxygen Saturation % 92 % 91 %    4 Minute Liters of Oxygen 0 L 0 L    5 Minute Oxygen Saturation % 93 % 91 %    5 Minute Liters of Oxygen 0 L 0 L    6 Minute Oxygen Saturation % 93 % 91 %    6 Minute Liters of Oxygen 0 L 0 L    2 Minute Post Oxygen Saturation % 94 % 94 %    2 Minute Post Liters of Oxygen 0 L 0 L             Psychological, QOL, Others - Outcomes: PHQ 2/9:    04/08/2023    3:16 PM 01/08/2023    8:51 AM 11/21/2021   11:48 AM 11/21/2021   11:35 AM 05/29/2021    9:57 AM  Depression screen PHQ 2/9  Decreased Interest 0 0 0 0 0  Down, Depressed, Hopeless 0 0 0 0 0  PHQ - 2 Score 0 0 0 0 0  Altered sleeping 0 0   0  Tired, decreased energy 0 0   0  Change in appetite 0 0   0  Feeling bad or failure about yourself  0 0   0  Trouble concentrating 0 0   0  Moving slowly or fidgety/restless 0 0  0  Suicidal thoughts 0 0   0  PHQ-9 Score 0 0   0  Difficult doing work/chores Not difficult at all Not difficult at all   Not difficult at all    Quality of Life:   Personal Goals: Goals established at orientation with interventions provided to work toward goal.  Personal Goals and Risk Factors at Admission - 01/08/23 0836       Core Components/Risk Factors/Patient Goals on Admission    Weight Management Yes;Weight Maintenance    Improve shortness of breath with ADL's Yes    Intervention Provide education, individualized exercise plan and daily activity instruction to help decrease symptoms of SOB with activities of daily living.    Expected Outcomes Short Term: Improve cardiorespiratory fitness to achieve a reduction of symptoms when performing ADLs;Long Term: Be able to perform more ADLs without symptoms or delay the onset of symptoms              Personal Goals Discharge:  Goals and Risk Factor Review     Row Name 01/19/23 0848 02/15/23 1155 03/15/23 0908 04/09/23 0826        Core Components/Risk Factors/Patient Goals Review   Personal Goals Review Weight Management/Obesity;Improve shortness of breath with ADL's;Develop more efficient breathing techniques such as purse lipped breathing and diaphragmatic breathing and practicing self-pacing with activity.;Increase knowledge of respiratory medications and ability to use respiratory devices properly. Weight Management/Obesity;Improve shortness of breath with ADL's;Develop more efficient breathing techniques such as purse lipped breathing and diaphragmatic breathing and practicing self-pacing with activity.;Increase knowledge of respiratory medications and ability to use respiratory devices properly. Weight Management/Obesity;Improve shortness of breath with ADL's Weight Management/Obesity;Improve shortness of breath with ADL's    Review Unable to assess goals at this time. Melani has completed 1 session so far. Monthly review of patient's Core Components/Risk Factors/Patient Goals are as follows: Goal in progress for weight loss. Wanza has maintained her weight since last evaluation. She is knowledgeable of healthy eating habits and has incorporated a variety of foods into her daily living. Goal progressing on improving shortness of breath with ADLs. Constance has been able to exercise without the need for oxygen. She knows how to monitor her oxygen saturation on her own. Zamira has been able to increase her METs and his workload. She knows how to report her rate of exertion and dyspnea level. Goal met on developing more efficient breathing techniques such as purse lipped breathing and diaphragmatic breathing; and practicing self-pacing activities. Shawnta can demonstrate purse lipped breathing and can initiate it on her own when she gets short of breath. She is also able to demonstrate diaphragmatic breathing and states she practices this at home. She can self-pace herself as needed and takes breaks at appropriate times  based on her shortness of breath. Goal also met on increasing her knowledge of respiratory medications and her ability to use the devices properly. Cledith can correctly demonstrate how/when to use her inhalers with our respiratory therapists. Makira will continue to benefit from participation in PR for nutrition, education, exercise, and lifestyle modification. Monthly review of patient's Core Components/Risk Factors/Patient Goals are as follows: Goal in progress for weight maintenance. Kaedyn has maintained her weight since last evaluation. She is knowledgeable of healthy eating habits and has incorporated a variety of foods into her daily living. Goal progressing on improving shortness of breath with ADLs. Issa has been able to exercise without the need for oxygen. She knows how to monitor her oxygen saturation on her own.  Halston has been able to increase her METs and his workload. She knows how to report her rate of exertion and dyspnea level. Kellin will continue to benefit from participation in PR for nutrition, education, exercise, and lifestyle modification. Review of patient's Core Components/Risk Factors/Patient Goals at graduation are as follows: Goal met for weight maintenance. Jaileen has maintained her weight since beginning the program. She is knowledgeable of healthy eating habits and has incorporated a variety of foods into her daily living. Goal met on improving her shortness of breath with ADLs. Angala has been able to exercise without the need for oxygen. She knows how to monitor her oxygen saturation on her own. Maude has been able to increase her METs and workload. She improved by 54.7% on her post . Her mets increased from 2.69 to 4.3 and grip strength increased from 29 to 31. Kendrah's shortness of breath score decreased from 39 to 27 and her CAT score decreased from 20 to 11. We are proud of the success Jolinda has made in the program!    Expected Outcomes For  Cristin to lose weight, increase her knowledge of respiratory medications and how to use them properly, improve her shortness of breath with ADLs, and develop more efficient breathing techniques such as purse lipped breathing and diaphragmatic breathing, learning how to self-pace with activity. For Rollene to maintain her weight, increase her knowledge of respiratory medications and how to use them properly, improve her shortness of breath with ADLs, and develop more efficient breathing techniques such as purse lipped breathing and diaphragmatic breathing, learning how to self-pace with activity. For Rollene to maintain her weight and improve her shortness of breath with ADLs For Rollene to maintain her weight and improve her shortness of breath with ADLs post PR             Exercise Goals and Review:  Exercise Goals     Row Name 01/08/23 0837             Exercise Goals   Increase Physical Activity Yes       Intervention Provide advice, education, support and counseling about physical activity/exercise needs.;Develop an individualized exercise prescription for aerobic and resistive training based on initial evaluation findings, risk stratification, comorbidities and participant's personal goals.       Expected Outcomes Short Term: Attend rehab on a regular basis to increase amount of physical activity.;Long Term: Add in home exercise to make exercise part of routine and to increase amount of physical activity.;Long Term: Exercising regularly at least 3-5 days a week.       Increase Strength and Stamina Yes       Intervention Provide advice, education, support and counseling about physical activity/exercise needs.;Develop an individualized exercise prescription for aerobic and resistive training based on initial evaluation findings, risk stratification, comorbidities and participant's personal goals.       Expected Outcomes Short Term: Increase workloads from initial exercise prescription  for resistance, speed, and METs.;Short Term: Perform resistance training exercises routinely during rehab and add in resistance training at home;Long Term: Improve cardiorespiratory fitness, muscular endurance and strength as measured by increased METs and functional capacity ( )       Able to understand and use rate of perceived exertion (RPE) scale Yes       Intervention Provide education and explanation on how to use RPE scale       Expected Outcomes Short Term: Able to use RPE daily in rehab to express subjective intensity level;Long Term:  Able to use RPE to guide intensity level when exercising independently       Able to understand and use Dyspnea scale Yes       Intervention Provide education and explanation on how to use Dyspnea scale       Expected Outcomes Short Term: Able to use Dyspnea scale daily in rehab to express subjective sense of shortness of breath during exertion;Long Term: Able to use Dyspnea scale to guide intensity level when exercising independently       Knowledge and understanding of Target Heart Rate Range (THRR) Yes       Intervention Provide education and explanation of THRR including how the numbers were predicted and where they are located for reference       Expected Outcomes Short Term: Able to state/look up THRR;Long Term: Able to use THRR to govern intensity when exercising independently;Short Term: Able to use daily as guideline for intensity in rehab       Understanding of Exercise Prescription Yes       Intervention Provide education, explanation, and written materials on patient's individual exercise prescription       Expected Outcomes Short Term: Able to explain program exercise prescription                Exercise Goals Re-Evaluation:  Exercise Goals Re-Evaluation     Row Name 01/19/23 0812 02/11/23 0840 03/16/23 0831 04/08/23 1526       Exercise Goal Re-Evaluation   Exercise Goals Review Increase Physical Activity;Able to understand and use  Dyspnea scale;Understanding of Exercise Prescription;Increase Strength and Stamina;Knowledge and understanding of Target Heart Rate Range (THRR);Able to understand and use rate of perceived exertion (RPE) scale Increase Physical Activity;Able to understand and use Dyspnea scale;Understanding of Exercise Prescription;Increase Strength and Stamina;Knowledge and understanding of Target Heart Rate Range (THRR);Able to understand and use rate of perceived exertion (RPE) scale Increase Physical Activity;Able to understand and use Dyspnea scale;Understanding of Exercise Prescription;Increase Strength and Stamina;Knowledge and understanding of Target Heart Rate Range (THRR);Able to understand and use rate of perceived exertion (RPE) scale Increase Physical Activity;Able to understand and use Dyspnea scale;Understanding of Exercise Prescription;Increase Strength and Stamina;Knowledge and understanding of Target Heart Rate Range (THRR);Able to understand and use rate of perceived exertion (RPE) scale    Comments Jeyda has completed one group exercise session.She exercised on the recumbent elliptical for 15 min, level 2, METs 3.6. She then walked on the treadmill for 15 min, 2 mph, 2 incline, METs 3.0. Tolerated well. Pt able to perform warm up and cool down, including squats. She tolerated her first day well and we will progress her as tolerated. Tameisha has completed 7 group exercise sessions. She is exercising on the recumbent elliptical for 15 min, level 3, METs 4.9. She is now walking on the treadmill for 15 min, 2.4 mph, 3 incline, METs 3.6.  She is motivated and progressing well. She enjoys exercise. Pt performs warm up and cool down standing, including squats. Will continue to progress as tolerated. Yaelis has completed 15 group exercise sessions. She recently has had vertigo therefore needed to change equipment and decrease her intensity. She is exercising on the recumbent stepper for 15 min, level 3, METs 2.9.  She then is exercising on the recumbent bike for 15 min, level 3, METs 2.5. She will return to original equipment once her vertigo is resolved. She is using 7.3 lb black bands for resistance training. Haylyn completed 22 group exercise sessions. Her max METs on the  treadmill were 4.2. Her max METs on the recumbent elliptical were 6.0. She increased her 6 min walk test by 54.7%, 658 ft. She also decreased her SOB and CAT scale. Her grip strength increased from 29 to 31 kg. She has been motivated and successful.    Expected Outcomes Through exercise at rehab and home, the patient will decrease shortness of breath with daily activities and feel confident in carrying out an exercise regimen at home. Through exercise at rehab and home, the patient will decrease shortness of breath with daily activities and feel confident in carrying out an exercise regimen at home. Through exercise at rehab and home, the patient will decrease shortness of breath with daily activities and feel confident in carrying out an exercise regimen at home. Through exercise at rehab and home, the patient will decrease shortness of breath with daily activities and feel confident in carrying out an exercise regimen at home.             Nutrition & Weight - Outcomes:    Nutrition:  Nutrition Therapy & Goals - 01/14/23 1403       Nutrition Therapy   Diet General Healthy Diet      Personal Nutrition Goals   Nutrition Goal Patient to maintain diet quality by using the plate method as a guide for meal planning to include lean protein/plant protein, fruits, vegetables, whole grains, nonfat dairy as part of a well-balanced diet.    Personal Goal #2 Patient to maintain weight throughout duration of pulmonary rehab.    Comments Patient has medical history of COPD, bronchiectasis, acute and chronic respiratory failure, severe persistent astham. Lillyanne reports following a regular diet and motivation to maintain her weight. She has a  history of using Ensure supplements 2x/day (220kcals, 9g protein each). She has previously completed pulmonary rehab in 2017 and most recent in March of 2023. Patient will benefit from participation in pulmonary rehab for nutrition and exercise support.      Intervention Plan   Intervention Prescribe, educate and counsel regarding individualized specific dietary modifications aiming towards targeted core components such as weight, hypertension, lipid management, diabetes, heart failure and other comorbidities.;Nutrition handout(s) given to patient.    Expected Outcomes Short Term Goal: Understand basic principles of dietary content, such as calories, fat, sodium, cholesterol and nutrients.;Long Term Goal: Adherence to prescribed nutrition plan.             Nutrition Discharge:  Nutrition Assessments - 04/08/23 1519       Rate Your Plate Scores   Post Score 50             Education Questionnaire Score:  Knowledge Questionnaire Score - 04/08/23 1519       Knowledge Questionnaire Score   Post Score 17/18            Rollene graduated the PR program completing 22 sessions. At the time of graduation, PHQ 2& 9 scores did not change from initial assessment, scores 0/0. Ilena continued to deny any psychosocial concerns.   Review of patient's Core Components/Risk Factors/Patient Goals at graduation are as follows: Goal met for weight maintenance. Joyanne has maintained her weight since beginning the program. She is knowledgeable of healthy eating habits and has incorporated a variety of foods into her daily living. Goal met on improving her shortness of breath with ADLs. Alaiza has been able to exercise without the need for oxygen. She knows how to monitor her oxygen saturation on her own. Berdia has been able  to increase her METs and workload. She improved by 54.7% on her post . Her mets increased from 2.69 to 4.3 and grip strength increased from 29 to 31. Akirra's  shortness of breath score decreased from 39 to 27 and her CAT score decreased from 20 to 11. We are proud of the success Gwenlyn has made in the program!   Goals reviewed with patient; copy given to patient.

## 2023-05-24 NOTE — Progress Notes (Unsigned)
 Office Visit Note   Patient: Patricia Johnston           Date of Birth: Jul 10, 1950           MRN: [id number] Visit Date: 05/25/2023              Requested by:  Bedelia Person, MD [address] [place],  [place] [address]  PCP: Bedelia Person, MD   Assessment & Plan: Visit Diagnoses: No diagnosis found.  Plan: ***  Follow-Up Instructions: No follow-ups on file.   Orders:  No orders of the defined types were placed in this encounter. No orders of the defined types were placed in this encounter.    Procedures: No procedures performed   Clinical Data: No additional findings.   Subjective: No chief complaint on file.  HPI  Review of Systems   Objective: Vital Signs: There were no vitals taken for this visit.  Physical Exam  Ortho Exam  Specialty Comments:  No specialty comments available.  Imaging: No results found.   PMFS History: Patient Active Problem List   Diagnosis Date Noted  . Acute exacerbation of moderate persistent extrinsic asthma 01/08/2023  . Adult idiopathic generalized osteoporosis 01/08/2023  . Carpal tunnel syndrome 01/08/2023  . Cervical spondylosis 01/08/2023  . Gastroesophageal reflux disease 01/08/2023  . Osteoarthritis 01/08/2023  . Selective immunoglobulin M deficiency (HCC) 01/08/2023  . Severe persistent allergic asthma 01/08/2023  . Chronic obstructive pulmonary disease, unspecified (HCC) 01/08/2023  . Acute bronchitis 01/08/2023  . Chronic sinusitis 01/08/2023  . Allergic rhinitis 07/02/2022  . 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 Bilateral impacted cerumen 08/26/2021  . Acute and chronic respiratory failure with hypoxia (HCC) 05/22/2016  . Bronchiectasis without complication (HCC) 11/14/2015  . Elevated IgE level 11/14/2015  . Eosinophilia 11/14/2015  . COPD with acute exacerbation (HCC) 04/19/2015  . Feeling of sadness 04/19/2015   Past Medical History:  Diagnosis Date   . Anxiety   . Asthma   . COPD (chronic obstructive pulmonary disease) (HCC)    pt states due to [employer] service contracted COPD  . Difficult intubation    Pt does not remember details over 30 years ago  . Eczema   . Esophageal thickening 10/13/2018   on CT- [facility]  . GERD (gastroesophageal reflux disease)   . Hiatal hernia   . Osteoporosis   . PONV (postoperative nausea and vomiting)   . Pulmonary emphysema (HCC)   . Pulmonary nodules   . Traumatic arthritis   . Umbilical hernia   . Wears glasses     Family History  Problem Relation Age of Onset  . Heart attack Mother   . Colon cancer Neg Hx   . Esophageal cancer Neg Hx     Past Surgical History:  Procedure Laterality Date  . ABDOMINAL HYSTERECTOMY    . bunionectomy    . COLONOSCOPY WITH ESOPHAGOGASTRODUODENOSCOPY (EGD)    . CYST EXCISION Right 07/05/2019   Procedure: Excision of right ear canal cyst;  Surgeon: Graylin Shiver, MD;  Location: [facility] OR;  Service: ENT;  Laterality: Right;  . dental implant     Social History   Occupational History  . Occupation: retired  . 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 Smoking status: Never  . Smokeless tobacco: Never  Vaping Use  . Vaping status: Never Used  Substance and Sexual Activity  . Alcohol use: Not Currently  . Drug use:  No  . Sexual activity: Not Currently

## 2023-05-25 ENCOUNTER — Ambulatory Visit (INDEPENDENT_AMBULATORY_CARE_PROVIDER_SITE_OTHER): Admitting: Orthopaedic Surgery

## 2023-05-25 ENCOUNTER — Encounter: Payer: Self-pay | Admitting: Orthopaedic Surgery

## 2023-05-25 DIAGNOSIS — G8929 Other chronic pain: Secondary | ICD-10-CM | POA: Diagnosis not present

## 2023-05-25 DIAGNOSIS — M25512 Pain in left shoulder: Secondary | ICD-10-CM

## 2023-05-27 ENCOUNTER — Other Ambulatory Visit: Payer: Self-pay | Admitting: Orthopaedic Surgery

## 2023-05-27 ENCOUNTER — Telehealth: Payer: Self-pay

## 2023-05-27 DIAGNOSIS — G8929 Other chronic pain: Secondary | ICD-10-CM

## 2023-05-27 NOTE — Telephone Encounter (Signed)
 [facility] sending you an order to sign on this patient to have a 3D Recon CT, please sign when you see it come through, thanks

## 2023-05-31 ENCOUNTER — Other Ambulatory Visit

## 2023-06-01 ENCOUNTER — Ambulatory Visit
Admission: RE | Admit: 2023-06-01 | Discharge: 2023-06-01 | Disposition: A | Discharge status: Home / Self Care | Source: Ambulatory Visit | Attending: Orthopaedic Surgery | Admitting: Orthopaedic Surgery

## 2023-06-01 DIAGNOSIS — G8929 Other chronic pain: Secondary | ICD-10-CM

## 2023-06-03 ENCOUNTER — Telehealth: Payer: Self-pay | Admitting: Orthopaedic Surgery

## 2023-06-03 NOTE — Telephone Encounter (Signed)
 Patient called stating that she [facility] that she [facility] have gotten an mri instead of a ct scan. Please advise and call pt

## 2023-06-03 NOTE — Telephone Encounter (Signed)
 Called and relayed to patient. She states understanding.

## 2023-06-03 NOTE — Telephone Encounter (Signed)
 Sometimes an MRI is necessary but a CT scan is always required for surgical planning.  I'll let either bokshan or dean decide if MRI is necessary.

## 2023-06-08 ENCOUNTER — Telehealth: Payer: Self-pay | Admitting: Orthopaedic Surgery

## 2023-06-08 NOTE — Telephone Encounter (Signed)
 Patient needs copy of CT report ASAP. Call when report is in chart. [phone]

## 2023-06-11 ENCOUNTER — Telehealth: Payer: Self-pay | Admitting: Orthopaedic Surgery

## 2023-06-11 NOTE — Telephone Encounter (Signed)
 Patient needs copy of CT report ASAP. Call when report is in chart. [phone]

## 2023-06-14 NOTE — Telephone Encounter (Signed)
 IC patient, advised her CT report is in chart. Copy of the report at front desk for patient to pickup.

## 2023-06-14 NOTE — Telephone Encounter (Signed)
 As of 06/14/2023 it has not been read yet.

## 2023-07-12 ENCOUNTER — Telehealth: Payer: Self-pay | Admitting: Orthopaedic Surgery

## 2023-07-12 NOTE — Telephone Encounter (Signed)
 Patient called and asking for additional services from the [facility] community care. She had a referral in and needs to get sent back to the community care. CB#[phone]

## 2023-07-13 NOTE — Telephone Encounter (Signed)
 Please see below Patricia Johnston.  Thanks!

## 2023-08-06 ENCOUNTER — Encounter (HOSPITAL_COMMUNITY): Payer: Self-pay

## 2023-08-06 ENCOUNTER — Telehealth (HOSPITAL_COMMUNITY): Payer: Self-pay

## 2023-08-06 NOTE — Telephone Encounter (Signed)
 Outside/paper referral received by Dr. Agustina Aldrich from [place]. Attempted to contact patient to confirm interest in pulmonary rehab as they just participated in the program and graduated in February.  No answer, left message to call us  back.

## 2023-08-10 ENCOUNTER — Telehealth (HOSPITAL_COMMUNITY): Payer: Self-pay

## 2023-08-10 NOTE — Telephone Encounter (Signed)
 Outside/paper referral received by Dr. Agustina Aldrich from [place]. Passing to nurse navigator for review.  [facility] Auth# [id number]  Spoke with patient on 6/09, confirmed interest. Patient has completed PR program previously.

## 2023-08-16 ENCOUNTER — Telehealth (HOSPITAL_COMMUNITY): Payer: Self-pay

## 2023-08-16 NOTE — Telephone Encounter (Signed)
 Patient called to get scheduled in the Pulmonary Rehab Program. Patient will come in for orientation on 06/23 and will attend the 1:15 exercise class.  Pensions consultant.

## 2023-08-18 ENCOUNTER — Other Ambulatory Visit (HOSPITAL_COMMUNITY): Payer: Self-pay

## 2023-08-20 ENCOUNTER — Telehealth (HOSPITAL_COMMUNITY): Payer: Self-pay

## 2023-08-20 NOTE — Telephone Encounter (Signed)
 Called to confirm appt. Pt confirmed appt. Instructed pt on proper footwear. Gave directions along with department number.

## 2023-08-23 ENCOUNTER — Encounter (HOSPITAL_COMMUNITY)
Admission: RE | Admit: 2023-08-23 | Discharge: 2023-08-23 | Disposition: A | Discharge status: Home / Self Care | Source: Ambulatory Visit | Attending: Pulmonary Disease | Admitting: Pulmonary Disease

## 2023-08-23 ENCOUNTER — Encounter (HOSPITAL_COMMUNITY): Payer: Self-pay

## 2023-08-23 VITALS — BP 116/60 | HR 72 | Wt 128.3 lb

## 2023-08-23 DIAGNOSIS — J4550 Severe persistent asthma, uncomplicated: Secondary | ICD-10-CM | POA: Diagnosis present

## 2023-08-23 NOTE — Progress Notes (Signed)
 Rollene Lemmings 73 y.o. female Pulmonary Rehab Orientation Note This patient who was referred to Pulmonary Rehab by Dr. Eber with the diagnosis of Severe persistent asthma, uncomplicated arrived today in Cardiac and Pulmonary Rehab. She  arrived ambulatory with normal gait. She  does not carry portable oxygen. Per patient, Miliani uses oxygen never. Color good, skin warm and dry. Patient is oriented to time and place. Patient's medical history, psychosocial health, and medications reviewed. Psychosocial assessment reveals patient lives with alone. Sherill is currently retired. Patient hobbies include reading, gardening, and bowling. Patient reports her stress level is low. Areas of stress/anxiety include health. Patient does not exhibit signs of depression. PHQ2/9 score 0/0. Lynze shows good  coping skills with positive outlook on life. Offered emotional support and reassurance. Will continue to monitor. Physical assessment performed by Nurse pick: Ronal Levin RN. Please see their orientation physical assessment note. Gracious reports she does take medications as prescribed. Patient states she follows a regular  diet. The patient would like to maintain her weight during the program.  Patient's weight will be monitored closely. Demonstration and practice of PLB using pulse oximeter. Renia able to return demonstration satisfactorily. Safety and hand hygiene in the exercise area reviewed with patient. Simon voices understanding of the information reviewed. Department expectations discussed with patient and achievable goals were set. The patient shows enthusiasm about attending the program and we look forward to working with Rollene. Bricia completed a 6 min walk test today and is scheduled to begin exercise on 08/31/23@ 1:15.  [id number] Augustin KATHEE Sharps, BSRT

## 2023-08-23 NOTE — Progress Notes (Signed)
 Patricia Johnston 73 y.o. female  Initial Psychosocial Assessment  Pt psychosocial assessment reveals pt lives alone. Pt is currently retired. Pt hobbies include reading, gardening, and bowling. Pt reports her stress level is low. Areas of stress/anxiety include health.  Pt does not exhibit signs of depression. Pt shows good  coping skills with positive outlook . Offered emotional support and reassurance. Monitor and evaluate progress toward psychosocial goal(s).  Goal(s) Help patient work toward returning to meaningful activities that improve patient's QOL and are attainable with patient's lung disease   08/23/2023 11:19 AM

## 2023-08-23 NOTE — Progress Notes (Signed)
 Pulmonary Individual Treatment Plan  Patient Details  Name: Patricia Johnston MRN: [id number] Date of Birth: 04-Jan-1951 Referring Provider:   Conrad Ports Pulmonary Rehab Walk Test from 08/23/2023 in [facility], [facility]  Referring Provider Bina    Initial Encounter Date:  Flowsheet Row Pulmonary Rehab Walk Test from 08/23/2023 in [facility], [facility]  Date 08/23/23    Visit Diagnosis: Severe persistent asthma, unspecified whether complicated  Patient's Home Medications on Admission:   Current Outpatient Medications:    albuterol  (PROVENTIL ) (2.5 MG/3ML) 0.083% nebulizer solution, Take 2.5 mg by nebulization as needed for wheezing or shortness of breath., Disp: , Rfl:    albuterol  (VENTOLIN  HFA) 108 (90 Base) MCG/ACT inhaler, Inhale 2 puffs into the lungs every 4 (four) hours as needed for wheezing or shortness of breath., Disp: 1 Inhaler, Rfl: 1   alendronate (FOSAMAX) 70 MG tablet, Take 70 mg by mouth every Monday. Take with a full glass of water on an empty stomach., Disp: , Rfl:    Calcium Carb-Cholecalciferol (CALCIUM 500+D PO), Take 1 tablet by mouth daily., Disp: , Rfl:    diphenhydrAMINE  (BENADRYL ) 25 MG tablet, Take 25 mg by mouth every 6 (six) hours as needed (anxiety)., Disp: , Rfl:    feeding supplement, ENSURE ENLIVE, (ENSURE ENLIVE) LIQD, Take 237 mLs by mouth 2 (two) times daily between meals., Disp: 237 mL, Rfl: 12   guaiFENesin  (MUCINEX ) 600 MG 12 hr tablet, Take 1 tablet (600 mg total) by mouth 2 (two) times daily., Disp: 30 tablet, Rfl: 0   mometasone  (ASMANEX ) 220 MCG/INH inhaler, Inhale 1 puff into the lungs 2 (two) times daily., Disp: 1 each, Rfl: 11   Multiple Vitamins-Minerals (MULTIVITAMIN WITH MINERALS) tablet, Take 2 tablets by mouth daily., Disp: , Rfl:    pantoprazole  (PROTONIX ) 40 MG tablet, Take 1 tablet (40 mg total) by mouth daily., Disp: 90 tablet, Rfl: 3    sodium chloride  HYPERTONIC 3 % nebulizer solution, INHALE 1 VIAL BY VIA NEBULIZER TWICE A DAY, Disp: , Rfl:    Tiotropium Bromide -Olodaterol 2.5-2.5 MCG/ACT AERS, Inhale 2 puffs into the lungs daily., Disp: 1 each, Rfl: 11   budesonide  (RHINOCORT  AQUA) 32 MCG/ACT nasal spray, Place into the nose. (Patient not taking: Reported on 08/23/2023), Disp: , Rfl:    predniSONE  (DELTASONE ) 20 MG tablet, 2 tabs po daily x 4 days (Patient not taking: Reported on 08/23/2023), Disp: 8 tablet, Rfl: 0   Tiotropium Bromide -Olodaterol (STIOLTO RESPIMAT ) 2.5-2.5 MCG/ACT AERS, Inhale into the lungs daily., Disp: , Rfl:   Current Facility-Administered Medications:    0.9 %  sodium chloride  infusion, 500 mL, Intravenous, Once, Patricia Gwendlyn DASEN, MD  Past Medical History: Past Medical History:  Diagnosis Date   Anxiety    Asthma    COPD (chronic obstructive pulmonary disease) (HCC)    pt states due to [employer] service contracted COPD   Difficult intubation    Pt does not remember details over 30 years ago   Eczema    Esophageal thickening 10/13/2018   on CT- [facility]   GERD (gastroesophageal reflux disease)    Hiatal hernia    Osteoporosis    PONV (postoperative nausea and vomiting)    Pulmonary emphysema (HCC)    Pulmonary nodules    Traumatic arthritis    Umbilical hernia    Wears glasses     Tobacco Use: Social History   Tobacco Use  Smoking Status  Never  Smokeless Tobacco Never    Labs: Review Flowsheet        No data to display          Capillary Blood Glucose: Lab Results  Component Value Date   GLUCAP 99 06/16/2017     Pulmonary Assessment Scores:  Pulmonary Assessment Scores     Row Name 04/08/23 1518 08/23/23 1111       ADL UCSD   ADL Phase Exit Entry    SOB Score total 27 26      CAT Score   CAT Score 11 18      mMRC Score   mMRC Score 2 1      UCSD: Self-administered rating of dyspnea associated with activities of daily living  (ADLs) 6-point scale (0 = not at all to 5 = maximal or unable to do because of breathlessness)  Scoring Scores range from 0 to 120.  Minimally important difference is 5 units  CAT: CAT can identify the health impairment of COPD patients and is better correlated with disease progression.  CAT has a scoring range of zero to 40. The CAT score is classified into four groups of low (less than 10), medium (10 - 20), high (21-30) and very high (31-40) based on the impact level of disease on health status. A CAT score over 10 suggests significant symptoms.  A worsening CAT score could be explained by an exacerbation, poor medication adherence, poor inhaler technique, or progression of COPD or comorbid conditions.  CAT MCID is 2 points  mMRC: mMRC (Modified Medical Research Council) Dyspnea Scale is used to assess the degree of baseline functional disability in patients of respiratory disease due to dyspnea. No minimal important difference is established. A decrease in score of 1 point or greater is considered a positive change.   Pulmonary Function Assessment:  Pulmonary Function Assessment - 08/23/23 1045       Breath   Bilateral Breath Sounds Clear;Decreased    Shortness of Breath Yes;Limiting activity          Exercise Target Goals: Exercise Program Goal: Individual exercise prescription set using results from initial 6 min walk test and THRR while considering  patient's activity barriers and safety.   Exercise Prescription Goal: Initial exercise prescription builds to 30-45 minutes a day of aerobic activity, 2-3 days per week.  Home exercise guidelines will be given to patient during program as part of exercise prescription that the participant will acknowledge.  Activity Barriers & Risk Stratification:  Activity Barriers & Cardiac Risk Stratification - 08/23/23 1043       Activity Barriers & Cardiac Risk Stratification   Activity Barriers Deconditioning;Muscular  Weakness;Shortness of Breath;Arthritis   arthritis in shoulders   Cardiac Risk Stratification Moderate          6 Minute Walk:  6 Minute Walk     Row Name 04/08/23 1522 08/23/23 1117       6 Minute Walk   Phase Discharge Initial    Distance 1860 feet 1320 feet    Distance % Change 54.7 % --    Distance Feet Change 658 ft --    Walk Time 6 minutes 6 minutes    # of Rest Breaks 0 0    MPH 3.52 2.5    METS 4.36 3.05    RPE 12 9    Perceived Dyspnea  1 1    VO2 Peak 15.09 10.64    Symptoms No No    Resting HR 65  bpm 72 bpm    Resting BP 110/66 116/60    Resting Oxygen Saturation  96 % 95 %    Exercise Oxygen Saturation  during 6 min walk 91 % 92 %    Max Ex. HR 101 bpm 104 bpm    Max Ex. BP 164/68 122/64    2 Minute Post BP 140/60 108/60      Interval HR   1 Minute HR 78 86    2 Minute HR 95 93    3 Minute HR 92 96    4 Minute HR 101 104    5 Minute HR 101 101    6 Minute HR 101 109    2 Minute Post HR 65 68    Interval Heart Rate? Yes Yes      Interval Oxygen   Interval Oxygen? -- Yes    Baseline Oxygen Saturation % 96 % 95 %    1 Minute Oxygen Saturation % 94 % 94 %    1 Minute Liters of Oxygen 0 L 0 L    2 Minute Oxygen Saturation % 96 % 92 %    2 Minute Liters of Oxygen 0 L 0 L    3 Minute Oxygen Saturation % 91 % 93 %    3 Minute Liters of Oxygen 0 L 0 L    4 Minute Oxygen Saturation % 91 % 94 %    4 Minute Liters of Oxygen 0 L 0 L    5 Minute Oxygen Saturation % 91 % 94 %    5 Minute Liters of Oxygen 0 L 0 L    6 Minute Oxygen Saturation % 91 % 94 %    6 Minute Liters of Oxygen 0 L 0 L    2 Minute Post Oxygen Saturation % 94 % 96 %    2 Minute Post Liters of Oxygen 0 L 0 L       Oxygen Initial Assessment:  Oxygen Initial Assessment - 08/23/23 1044       Home Oxygen   Home Oxygen Device None    Sleep Oxygen Prescription None    Home Exercise Oxygen Prescription None    Home Resting Oxygen Prescription None      Initial 6 min Walk   Oxygen  Used None      Program Oxygen Prescription   Program Oxygen Prescription None      Intervention   Short Term Goals To learn and understand importance of monitoring SPO2 with pulse oximeter and demonstrate accurate use of the pulse oximeter.;To learn and understand importance of maintaining oxygen saturations>88%;To learn and demonstrate proper pursed lip breathing techniques or other breathing techniques. ;To learn and demonstrate proper use of respiratory medications    Long  Term Goals Maintenance of O2 saturations>88%;Compliance with respiratory medication;Verbalizes importance of monitoring SPO2 with pulse oximeter and return demonstration;Exhibits proper breathing techniques, such as pursed lip breathing or other method taught during program session;Demonstrates proper use of MDI's          Oxygen Re-Evaluation:  Oxygen Re-Evaluation     Row Name 03/16/23 0836             Program Oxygen Prescription   Program Oxygen Prescription None         Home Oxygen   Home Oxygen Device None       Sleep Oxygen Prescription None       Home Exercise Oxygen Prescription None       Home Resting  Oxygen Prescription None         Goals/Expected Outcomes   Short Term Goals To learn and understand importance of monitoring SPO2 with pulse oximeter and demonstrate accurate use of the pulse oximeter.;To learn and understand importance of maintaining oxygen saturations>88%;To learn and demonstrate proper pursed lip breathing techniques or other breathing techniques. ;To learn and demonstrate proper use of respiratory medications       Long  Term Goals Maintenance of O2 saturations>88%;Compliance with respiratory medication;Verbalizes importance of monitoring SPO2 with pulse oximeter and return demonstration;Exhibits proper breathing techniques, such as pursed lip breathing or other method taught during program session;Demonstrates proper use of MDI's       Goals/Expected Outcomes Compliance and  understanding of oxygen saturation and breathing techniques to decrease shortness of breath          Oxygen Discharge (Final Oxygen Re-Evaluation):  Oxygen Re-Evaluation - 03/16/23 0836       Program Oxygen Prescription   Program Oxygen Prescription None      Home Oxygen   Home Oxygen Device None    Sleep Oxygen Prescription None    Home Exercise Oxygen Prescription None    Home Resting Oxygen Prescription None      Goals/Expected Outcomes   Short Term Goals To learn and understand importance of monitoring SPO2 with pulse oximeter and demonstrate accurate use of the pulse oximeter.;To learn and understand importance of maintaining oxygen saturations>88%;To learn and demonstrate proper pursed lip breathing techniques or other breathing techniques. ;To learn and demonstrate proper use of respiratory medications    Long  Term Goals Maintenance of O2 saturations>88%;Compliance with respiratory medication;Verbalizes importance of monitoring SPO2 with pulse oximeter and return demonstration;Exhibits proper breathing techniques, such as pursed lip breathing or other method taught during program session;Demonstrates proper use of MDI's    Goals/Expected Outcomes Compliance and understanding of oxygen saturation and breathing techniques to decrease shortness of breath          Initial Exercise Prescription:  Initial Exercise Prescription - 08/23/23 1100       Date of Initial Exercise RX and Referring Provider   Date 08/23/23    Referring Provider Bina    Expected Discharge Date 11/18/23      Treadmill   MPH 2.5    Grade 1    Minutes 15    METs 3.6      Elliptical   Level 1    Speed 1    Minutes 15    METs 3      Prescription Details   Frequency (times per week) 2    Duration Progress to 30 minutes of continuous aerobic without signs/symptoms of physical distress      Intensity   THRR 40-80% of Max Heartrate 59-118    Ratings of Perceived Exertion 11-13    Perceived  Dyspnea 0-4      Progression   Progression Continue progressive overload as per policy without signs/symptoms or physical distress.      Resistance Training   Training Prescription Yes    Weight black bands    Reps 10-15          Perform Capillary Blood Glucose checks as needed.  Exercise Prescription Changes:   Exercise Prescription Changes     Row Name 03/09/23 1400 03/23/23 1400 04/06/23 1500         Response to Exercise   Blood Pressure (Admit) 112/60 108/66 102/52     Blood Pressure (Exercise) 122/60 162/66 142/70  Blood Pressure (Exit) 108/60 98/56 118/58     Heart Rate (Admit) 67 bpm 73 bpm 62 bpm     Heart Rate (Exercise) 80 bpm 100 bpm 103 bpm     Heart Rate (Exit) 73 bpm 63 bpm 79 bpm     Oxygen Saturation (Admit) 985 % 94 % 95 %     Oxygen Saturation (Exercise) 95 % 93 % 92 %     Oxygen Saturation (Exit) 93 % 92 % 94 %     Rating of Perceived Exertion (Exercise) 9 13 13      Perceived Dyspnea (Exercise) 1 3 2      Duration Continue with 30 min of aerobic exercise without signs/symptoms of physical distress. Continue with 30 min of aerobic exercise without signs/symptoms of physical distress. Continue with 30 min of aerobic exercise without signs/symptoms of physical distress.     Intensity THRR unchanged THRR unchanged THRR unchanged       Progression   Progression Continue to progress workloads to maintain intensity without signs/symptoms of physical distress. Continue to progress workloads to maintain intensity without signs/symptoms of physical distress. --       Paramedic Prescription Yes Yes Yes     Weight black bands black bands black bands     Reps 10-15 10-15 10-15     Time 10 Minutes 10 Minutes 10 Minutes       Interval Training   Interval Training -- No --       Treadmill   MPH -- 2.6 2.9     Grade -- 3 3     Minutes -- 15 15     METs -- 4.07 4.2       Recumbant Bike   Level 2 -- --     RPM 32 -- --     Watts 8 --  --     Minutes 15 -- --     METs 1.6 -- --       NuStep   Level 3 -- --     SPM 43 -- --     Minutes 15 -- --     METs 1.5 -- --       Recumbant Elliptical   Level -- 5 5     Minutes -- 15 15     METs -- 6 4.7        Exercise Comments:   Exercise Goals and Review:   Exercise Goals     Row Name 08/23/23 1035             Exercise Goals   Increase Physical Activity Yes       Intervention Provide advice, education, support and counseling about physical activity/exercise needs.;Develop an individualized exercise prescription for aerobic and resistive training based on initial evaluation findings, risk stratification, comorbidities and participant's personal goals.       Expected Outcomes Short Term: Attend rehab on a regular basis to increase amount of physical activity.;Long Term: Add in home exercise to make exercise part of routine and to increase amount of physical activity.;Long Term: Exercising regularly at least 3-5 days a week.       Increase Strength and Stamina Yes       Intervention Provide advice, education, support and counseling about physical activity/exercise needs.;Develop an individualized exercise prescription for aerobic and resistive training based on initial evaluation findings, risk stratification, comorbidities and participant's personal goals.       Expected Outcomes Short Term: Increase workloads from  initial exercise prescription for resistance, speed, and METs.;Short Term: Perform resistance training exercises routinely during rehab and add in resistance training at home;Long Term: Improve cardiorespiratory fitness, muscular endurance and strength as measured by increased METs and functional capacity ( )       Able to understand and use rate of perceived exertion (RPE) scale Yes       Intervention Provide education and explanation on how to use RPE scale       Expected Outcomes Short Term: Able to use RPE daily in rehab to express subjective intensity  level;Long Term:  Able to use RPE to guide intensity level when exercising independently       Able to understand and use Dyspnea scale Yes       Intervention Provide education and explanation on how to use Dyspnea scale       Expected Outcomes Short Term: Able to use Dyspnea scale daily in rehab to express subjective sense of shortness of breath during exertion;Long Term: Able to use Dyspnea scale to guide intensity level when exercising independently       Knowledge and understanding of Target Heart Rate Range (THRR) Yes       Intervention Provide education and explanation of THRR including how the numbers were predicted and where they are located for reference       Expected Outcomes Short Term: Able to state/look up THRR;Long Term: Able to use THRR to govern intensity when exercising independently;Short Term: Able to use daily as guideline for intensity in rehab       Understanding of Exercise Prescription Yes       Intervention Provide education, explanation, and written materials on patient's individual exercise prescription       Expected Outcomes Short Term: Able to explain program exercise prescription;Long Term: Able to explain home exercise prescription to exercise independently          Exercise Goals Re-Evaluation :  Exercise Goals Re-Evaluation     Row Name 03/16/23 0831 04/08/23 1526           Exercise Goal Re-Evaluation   Exercise Goals Review Increase Physical Activity;Able to understand and use Dyspnea scale;Understanding of Exercise Prescription;Increase Strength and Stamina;Knowledge and understanding of Target Heart Rate Range (THRR);Able to understand and use rate of perceived exertion (RPE) scale Increase Physical Activity;Able to understand and use Dyspnea scale;Understanding of Exercise Prescription;Increase Strength and Stamina;Knowledge and understanding of Target Heart Rate Range (THRR);Able to understand and use rate of perceived exertion (RPE) scale      Comments  Holleigh has completed 15 group exercise sessions. She recently has had vertigo therefore needed to change equipment and decrease her intensity. She is exercising on the recumbent stepper for 15 min, level 3, METs 2.9. She then is exercising on the recumbent bike for 15 min, level 3, METs 2.5. She will return to original equipment once her vertigo is resolved. She is using 7.3 lb black bands for resistance training. Wrenn completed 22 group exercise sessions. Her max METs on the treadmill were 4.2. Her max METs on the recumbent elliptical were 6.0. She increased her 6 min walk test by 54.7%, 658 ft. She also decreased her SOB and CAT scale. Her grip strength increased from 29 to 31 kg. She has been motivated and successful.      Expected Outcomes Through exercise at rehab and home, the patient will decrease shortness of breath with daily activities and feel confident in carrying out an exercise regimen at home. Through exercise at  rehab and home, the patient will decrease shortness of breath with daily activities and feel confident in carrying out an exercise regimen at home.         Discharge Exercise Prescription (Final Exercise Prescription Changes):  Exercise Prescription Changes - 04/06/23 1500       Response to Exercise   Blood Pressure (Admit) 102/52    Blood Pressure (Exercise) 142/70    Blood Pressure (Exit) 118/58    Heart Rate (Admit) 62 bpm    Heart Rate (Exercise) 103 bpm    Heart Rate (Exit) 79 bpm    Oxygen Saturation (Admit) 95 %    Oxygen Saturation (Exercise) 92 %    Oxygen Saturation (Exit) 94 %    Rating of Perceived Exertion (Exercise) 13    Perceived Dyspnea (Exercise) 2    Duration Continue with 30 min of aerobic exercise without signs/symptoms of physical distress.    Intensity THRR unchanged      Resistance Training   Training Prescription Yes    Weight black bands    Reps 10-15    Time 10 Minutes      Treadmill   MPH 2.9    Grade 3    Minutes 15    METs  4.2      Recumbant Elliptical   Level 5    Minutes 15    METs 4.7          Nutrition:  Target Goals: Understanding of nutrition guidelines, daily intake of sodium 1500mg , cholesterol 200mg , calories 30% from fat and 7% or less from saturated fats, daily to have 5 or more servings of fruits and vegetables.  Biometrics:    Nutrition Therapy Plan and Nutrition Goals:   Nutrition Assessments:  Nutrition Assessments - 04/08/23 1519       Rate Your Plate Scores   Post Score 50         MEDIFICTS Score Key: >=70 Need to make dietary changes  40-70 Heart Healthy Diet <= 40 Therapeutic Level Cholesterol Diet  Flowsheet Row PULMONARY REHAB OTHER RESPIRATORY from [facility]/19/2023 in [facility]  Picture Your Plate Total Score on Admission 72   Picture Your Plate Scores: <59 Unhealthy dietary pattern with much room for improvement. 41-50 Dietary pattern unlikely to meet recommendations for good health and room for improvement. 51-60 More healthful dietary pattern, with some room for improvement.  >60 Healthy dietary pattern, although there may be some specific behaviors that could be improved.    Nutrition Goals Re-Evaluation:   Nutrition Goals Discharge (Final Nutrition Goals Re-Evaluation):   Psychosocial: Target Goals: Acknowledge presence or absence of significant depression and/or stress, maximize coping skills, provide positive support system. Participant is able to verbalize types and ability to use techniques and skills needed for reducing stress and depression.  Initial Review & Psychosocial Screening:  Initial Psych Review & Screening - 08/23/23 1041       Initial Review   Current issues with None Identified      Family Dynamics   Good Support System? Yes    Comments Pt has good support through her family and church.      Barriers   Psychosocial barriers to participate in program There are no  identifiable barriers or psychosocial needs.      Screening Interventions   Interventions Encouraged to exercise          Quality of Life Scores:  Scores of 19 and below usually indicate  a poorer quality of life in these areas.  A difference of  2-3 points is a clinically meaningful difference.  A difference of 2-3 points in the total score of the Quality of Life Index has been associated with significant improvement in overall quality of life, self-image, physical symptoms, and general health in studies assessing change in quality of life.  PHQ-9: Review Flowsheet  More data exists      08/23/2023 04/08/2023 01/08/2023 11/21/2021 05/29/2021  Depression screen PHQ 2/9  Decreased Interest 0 0 0 0 0 0  Down, Depressed, Hopeless 0 0 0 0 0 0  PHQ - 2 Score 0 0 0 0 0 0  Altered sleeping 0 0 0 - 0  Tired, decreased energy 0 0 0 - 0  Change in appetite 0 0 0 - 0  Feeling bad or failure about yourself  0 0 0 - 0  Trouble concentrating 0 0 0 - 0  Moving slowly or fidgety/restless 0 0 0 - 0  Suicidal thoughts 0 0 0 - 0  PHQ-9 Score 0 0 0 - 0  Difficult doing work/chores Not difficult at all Not difficult at all Not difficult at all - Not difficult at all    Details       Multiple values from one day are sorted in reverse-chronological order        Interpretation of Total Score  Total Score Depression Severity:  1-4 = Minimal depression, 5-9 = Mild depression, 10-14 = Moderate depression, 15-19 = Moderately severe depression, 20-27 = Severe depression   Psychosocial Evaluation and Intervention:  Psychosocial Evaluation - 08/23/23 1113       Psychosocial Evaluation & Interventions   Interventions Encouraged to exercise with the program and follow exercise prescription    Comments Cina denies any psychosocial barriers or concerns at this time    Expected Outcomes For Briseidy to participate in PR free of any psychosocial barriers or concerns    Continue Psychosocial Services  No  Follow up required          Psychosocial Re-Evaluation:  Psychosocial Re-Evaluation     Row Name 03/15/23 0908 04/09/23 9178           Psychosocial Re-Evaluation   Current issues with None Identified None Identified      Comments At monthly re-evaluation, Lyndall continues to deny any barriers or psychosocial concerns at this time Lorielle graduated the PR program completing 22 sessions. At the time of graduation, PHQ 2& 9 scores did not change from initial assessment, scores 0/0. Anays continued to deny any psychosocial concerns.      Expected Outcomes For Christyana to continue to be free of psychosocial concerns while participating in pulmonary rehab. For Indiyah to continue to be free of psychosocial concerns post PR      Interventions Encouraged to attend Pulmonary Rehabilitation for the exercise --      Continue Psychosocial Services  No Follow up required No Follow up required         Psychosocial Discharge (Final Psychosocial Re-Evaluation):  Psychosocial Re-Evaluation - 04/09/23 0821       Psychosocial Re-Evaluation   Current issues with None Identified    Comments Rollene graduated the PR program completing 22 sessions. At the time of graduation, PHQ 2& 9 scores did not change from initial assessment, scores 0/0. Roshini continued to deny any psychosocial concerns.    Expected Outcomes For Keni to continue to be free of psychosocial concerns post PR    Continue  Psychosocial Services  No Follow up required          Education: Education Goals: Education classes will be provided on a weekly basis, covering required topics. Participant will state understanding/return demonstration of topics presented.  Learning Barriers/Preferences:  Learning Barriers/Preferences - 08/23/23 1041       Learning Barriers/Preferences   Learning Barriers Sight;Cultural/Spiritual    Learning Preferences Skilled Demonstration;Written Material;Group Instruction;Individual  Instruction          Education Topics: Know Your Numbers Group instruction that is supported by a PowerPoint presentation. Instructor discusses importance of knowing and understanding resting, exercise, and post-exercise oxygen saturation, heart rate, and blood pressure. Oxygen saturation, heart rate, blood pressure, rating of perceived exertion, and dyspnea are reviewed along with a normal range for these values.  Flowsheet Row PULMONARY REHAB OTHER RESPIRATORY from 03/04/2023 in [facility] for Heart, Vascular, & Lung Health  Date 03/04/23  Educator EP  Instruction Review Code 1- Verbalizes Understanding    Exercise for the Pulmonary Patient Group instruction that is supported by a PowerPoint presentation. Instructor discusses benefits of exercise, core components of exercise, frequency, duration, and intensity of an exercise routine, importance of utilizing pulse oximetry during exercise, safety while exercising, and options of places to exercise outside of rehab.  Flowsheet Row PULMONARY REHAB OTHER RESPIRATORY from 02/25/2023 in [facility] for Heart, Vascular, & Lung Health  Date 02/25/23  Educator EP  Instruction Review Code 1- Verbalizes Understanding    MET Level  Group instruction provided by PowerPoint, verbal discussion, and written material to support subject matter. Instructor reviews what METs are and how to increase METs.  Flowsheet Row PULMONARY REHAB OTHER RESPIRATORY from 01/21/2023 in [facility] for Heart, Vascular, & Lung Health  Date 01/21/23  Educator EP  Instruction Review Code 1- Verbalizes Understanding    Pulmonary Medications Verbally interactive group education provided by instructor with focus on inhaled medications and proper administration. Flowsheet Row PULMONARY REHAB OTHER RESPIRATORY from 02/18/2023 in [facility] for Heart, Vascular, & Lung Health  Date  02/18/23  Educator RT  Instruction Review Code 1- Verbalizes Understanding    Anatomy and Physiology of the Respiratory System Group instruction provided by PowerPoint, verbal discussion, and written material to support subject matter. Instructor reviews respiratory cycle and anatomical components of the respiratory system and their functions. Instructor also reviews differences in obstructive and restrictive respiratory diseases with examples of each.  Flowsheet Row PULMONARY REHAB OTHER RESPIRATORY from 02/11/2023 in [facility] for Heart, Vascular, & Lung Health  Date 02/11/23  Educator RT  Instruction Review Code 1- Verbalizes Understanding    Oxygen Safety Group instruction provided by PowerPoint, verbal discussion, and written material to support subject matter. There is an overview of "What is Oxygen" and "Why do we need it".  Instructor also reviews how to create a safe environment for oxygen use, the importance of using oxygen as prescribed, and the risks of noncompliance. There is a brief discussion on traveling with oxygen and resources the patient may utilize. Flowsheet Row PULMONARY REHAB OTHER RESPIRATORY from 03/11/2023 in [facility] for Heart, Vascular, & Lung Health  Date 03/11/23  Educator RN  Instruction Review Code 1- Verbalizes Understanding    Oxygen Use Group instruction provided by PowerPoint, verbal discussion, and written material to discuss how supplemental oxygen is prescribed and different types of oxygen supply systems. Resources for more information are  provided.  Flowsheet Row PULMONARY REHAB OTHER RESPIRATORY from 03/18/2023 in [facility] for Heart, Vascular, & Lung Health  Date 03/18/23  Educator RT  Instruction Review Code 1- Verbalizes Understanding    Breathing Techniques Group instruction that is supported by demonstration and informational handouts. Instructor discusses the  benefits of pursed lip and diaphragmatic breathing and detailed demonstration on how to perform both.  Flowsheet Row PULMONARY REHAB OTHER RESPIRATORY from 03/25/2023 in [facility] for Heart, Vascular, & Lung Health  Date 03/25/23  Educator RN  Instruction Review Code 1- Verbalizes Understanding     Risk Factor Reduction Group instruction that is supported by a PowerPoint presentation. Instructor discusses the definition of a risk factor, different risk factors for pulmonary disease, and how the heart and lungs work together. Flowsheet Row PULMONARY REHAB OTHER RESPIRATORY from 01/14/2023 in [facility] for Heart, Vascular, & Lung Health  Date 01/14/23  Educator EP  Instruction Review Code 1- Verbalizes Understanding    Pulmonary Diseases Group instruction provided by PowerPoint, verbal discussion, and written material to support subject matter. Instructor gives an overview of the different type of pulmonary diseases. There is also a discussion on risk factors and symptoms as well as ways to manage the diseases. Flowsheet Row PULMONARY REHAB OTHER RESPIRATORY from 02/04/2023 in [facility] for Heart, Vascular, & Lung Health  Date 02/04/23  Educator RT  Instruction Review Code 1- Verbalizes Understanding    Stress and Energy Conservation Group instruction provided by PowerPoint, verbal discussion, and written material to support subject matter. Instructor gives an overview of stress and the impact it can have on the body. Instructor also reviews ways to reduce stress. There is also a discussion on energy conservation and ways to conserve energy throughout the day.   Warning Signs and Symptoms Group instruction provided by PowerPoint, verbal discussion, and written material to support subject matter. Instructor reviews warning signs and symptoms of stroke, heart attack, cold and flu. Instructor also reviews ways to  prevent the spread of infection.   Other Education Group or individual verbal, written, or video instructions that support the educational goals of the pulmonary rehab program. Flowsheet Row PULMONARY REHAB CHRONIC OBSTRUCTIVE PULMONARY DISEASE from 05/01/2021 in [facility] for Heart, Vascular, & Lung Health  Date 04/17/21  Educator --  [facility] Plate]     Knowledge Questionnaire Score:  Knowledge Questionnaire Score - 08/23/23 1111       Knowledge Questionnaire Score   Pre Score 16/18          Core Components/Risk Factors/Patient Goals at Admission:  Personal Goals and Risk Factors at Admission - 08/23/23 1042       Core Components/Risk Factors/Patient Goals on Admission    Weight Management Yes;Weight Maintenance    Intervention Weight Management: Develop a combined nutrition and exercise program designed to reach desired caloric intake, while maintaining appropriate intake of nutrient and fiber, sodium and fats, and appropriate energy expenditure required for the weight goal.;Weight Management: Provide education and appropriate resources to help participant work on and attain dietary goals.;Weight Management/Obesity: Establish reasonable short term and long term weight goals.;Obesity: Provide education and appropriate resources to help participant work on and attain dietary goals.    Admit Weight 128 lb 4.9 oz (58.2 kg)    Expected Outcomes Short Term: Continue to assess and modify interventions until short term weight is achieved;Long Term: Adherence to nutrition and physical activity/exercise program  aimed toward attainment of established weight goal;Weight Maintenance: Understanding of the daily nutrition guidelines, which includes 25-35% calories from fat, 7% or less cal from saturated fats, less than 200mg  cholesterol, less than 1.5gm of sodium, & 5 or more servings of fruits and vegetables daily;Understanding recommendations for meals to include 15-35% energy  as protein, 25-35% energy from fat, 35-60% energy from carbohydrates, less than 200mg  of dietary cholesterol, 20-35 gm of total fiber daily;Understanding of distribution of calorie intake throughout the day with the consumption of 4-5 meals/snacks    Improve shortness of breath with ADL's Yes    Intervention Provide education, individualized exercise plan and daily activity instruction to help decrease symptoms of SOB with activities of daily living.    Expected Outcomes Short Term: Improve cardiorespiratory fitness to achieve a reduction of symptoms when performing ADLs;Long Term: Be able to perform more ADLs without symptoms or delay the onset of symptoms          Core Components/Risk Factors/Patient Goals Review:   Goals and Risk Factor Review     Row Name 03/15/23 0908 04/09/23 0826           Core Components/Risk Factors/Patient Goals Review   Personal Goals Review Weight Management/Obesity;Improve shortness of breath with ADL's Weight Management/Obesity;Improve shortness of breath with ADL's      Review Monthly review of patient's Core Components/Risk Factors/Patient Goals are as follows: Goal in progress for weight maintenance. Teighan has maintained her weight since last evaluation. She is knowledgeable of healthy eating habits and has incorporated a variety of foods into her daily living. Goal progressing on improving shortness of breath with ADLs. Sherree has been able to exercise without the need for oxygen. She knows how to monitor her oxygen saturation on her own. Kaysia has been able to increase her METs and his workload. She knows how to report her rate of exertion and dyspnea level. Shirel will continue to benefit from participation in PR for nutrition, education, exercise, and lifestyle modification. Review of patient's Core Components/Risk Factors/Patient Goals at graduation are as follows: Goal met for weight maintenance. Rethel has maintained her weight since beginning  the program. She is knowledgeable of healthy eating habits and has incorporated a variety of foods into her daily living. Goal met on improving her shortness of breath with ADLs. Janeann has been able to exercise without the need for oxygen. She knows how to monitor her oxygen saturation on her own. Tami has been able to increase her METs and workload. She improved by 54.7% on her post . Her mets increased from 2.69 to 4.3 and grip strength increased from 29 to 31. Yaniah's shortness of breath score decreased from 39 to 27 and her CAT score decreased from 20 to 11. We are proud of the success Tekelia has made in the program!      Expected Outcomes For Lashawnda to maintain her weight and improve her shortness of breath with ADLs For Rollene to maintain her weight and improve her shortness of breath with ADLs post PR         Core Components/Risk Factors/Patient Goals at Discharge (Final Review):   Goals and Risk Factor Review - 04/09/23 0826       Core Components/Risk Factors/Patient Goals Review   Personal Goals Review Weight Management/Obesity;Improve shortness of breath with ADL's    Review Review of patient's Core Components/Risk Factors/Patient Goals at graduation are as follows: Goal met for weight maintenance. Saveah has maintained her weight since beginning the program. She  is knowledgeable of healthy eating habits and has incorporated a variety of foods into her daily living. Goal met on improving her shortness of breath with ADLs. Zuleyma has been able to exercise without the need for oxygen. She knows how to monitor her oxygen saturation on her own. Elesia has been able to increase her METs and workload. She improved by 54.7% on her post . Her mets increased from 2.69 to 4.3 and grip strength increased from 29 to 31. Blondina's shortness of breath score decreased from 39 to 27 and her CAT score decreased from 20 to 11. We are proud of the success Chloey has made in the  program!    Expected Outcomes For Rockelle to maintain her weight and improve her shortness of breath with ADLs post PR          ITP Comments:   Comments: Dr. Slater Staff is Medical Director for Pulmonary Rehab at [facility].

## 2023-08-31 ENCOUNTER — Encounter (HOSPITAL_COMMUNITY)
Admission: RE | Admit: 2023-08-31 | Discharge: 2023-08-31 | Disposition: A | Discharge status: Home / Self Care | Source: Ambulatory Visit | Attending: Pulmonary Disease | Admitting: Pulmonary Disease

## 2023-08-31 DIAGNOSIS — J4550 Severe persistent asthma, uncomplicated: Secondary | ICD-10-CM | POA: Diagnosis present

## 2023-08-31 NOTE — Progress Notes (Signed)
 Daily Session Note  Patient Details  Name: Patricia Johnston MRN: [id number] Date of Birth: 07-31-1950 Referring Provider:   Conrad Ports Pulmonary Rehab Walk Test from 08/23/2023 in [facility] for Heart, Vascular, & Lung Health  Referring Provider Bina    Encounter Date: 08/31/2023  Check In:  Session Check In - 08/31/23 1414       Check-In   Supervising physician immediately available to respond to emergencies [facility] MD immediately available    Physician(s) Jackee Alberts, NP    Location [facility]    Staff Present Johnnie Moats, MS, ACSM-CEP, Exercise Physiologist;Mary Harvy, RN, BSN;Kermit Arnette Claudene, RT;Randi Reeve BS, ACSM-CEP, Exercise Physiologist    Virtual Visit No    Medication changes reported     No    Fall or balance concerns reported    No    Tobacco Cessation No Change    Warm-up and Cool-down Performed as group-led instruction    Resistance Training Performed Yes    VAD Patient? No    PAD/SET Patient? No      Pain Assessment   Currently in Pain? No/denies    Multiple Pain Sites No          Capillary Blood Glucose: No results found for this or any previous visit (from the past 24 hours).    Social History   Tobacco Use  Smoking Status Never  Smokeless Tobacco Never    Goals Met:  Proper associated with RPD/PD & O2 Sat Independence with exercise equipment Exercise tolerated well No report of concerns or symptoms today Strength training completed today  Goals Unmet:  Not Applicable  Comments: Service time is from 1310 to 1438.    Dr. Slater Staff is Medical Director for Pulmonary Rehab at [facility].

## 2023-09-02 ENCOUNTER — Encounter (HOSPITAL_COMMUNITY)
Admission: RE | Admit: 2023-09-02 | Discharge: 2023-09-02 | Disposition: A | Discharge status: Home / Self Care | Source: Ambulatory Visit | Attending: Pulmonary Disease | Admitting: Pulmonary Disease

## 2023-09-02 DIAGNOSIS — J4550 Severe persistent asthma, uncomplicated: Secondary | ICD-10-CM | POA: Diagnosis not present

## 2023-09-02 NOTE — Progress Notes (Signed)
 Daily Session Note  Patient Details  Name: Patricia Johnston MRN: [id number] Date of Birth: January 27, 1951 Referring Provider:   Conrad Ports Pulmonary Rehab Walk Test from 08/23/2023 in [facility] for Heart, Vascular, & Lung Health  Referring Provider Bina    Encounter Date: 09/02/2023  Check In:  Session Check In - 09/02/23 1331       Check-In   Supervising physician immediately available to respond to emergencies [facility] MD immediately available    Physician(s) Lum Louis, NP    Location [facility]    Staff Present Johnnie Moats, MS, ACSM-CEP, Exercise Physiologist;Mary Harvy, RN, BSN;Randi Reeve BS, ACSM-CEP, Exercise Physiologist;Other    Virtual Visit No    Medication changes reported     No    Fall or balance concerns reported    No    Tobacco Cessation No Change    Warm-up and Cool-down Performed as group-led instruction    Resistance Training Performed Yes    VAD Patient? No    PAD/SET Patient? No      Pain Assessment   Currently in Pain? No/denies    Multiple Pain Sites No          Capillary Blood Glucose: No results found for this or any previous visit (from the past 24 hours).    Social History   Tobacco Use  Smoking Status Never  Smokeless Tobacco Never    Goals Met:  Proper associated with RPD/PD & O2 Sat Exercise tolerated well No report of concerns or symptoms today Strength training completed today  Goals Unmet:  Not Applicable  Comments: Service time is from 1317 to 1448.    Dr. Slater Staff is Medical Director for Pulmonary Rehab at [facility].

## 2023-09-07 ENCOUNTER — Encounter (HOSPITAL_COMMUNITY)
Admission: RE | Admit: 2023-09-07 | Discharge: 2023-09-07 | Disposition: A | Discharge status: Home / Self Care | Source: Ambulatory Visit | Attending: Pulmonary Disease | Admitting: Pulmonary Disease

## 2023-09-07 VITALS — Wt 127.4 lb

## 2023-09-07 DIAGNOSIS — J4550 Severe persistent asthma, uncomplicated: Secondary | ICD-10-CM | POA: Diagnosis not present

## 2023-09-07 NOTE — Progress Notes (Signed)
 Daily Session Note  Patient Details  Name: Patricia Johnston MRN: [id number] Date of Birth: 07-Mar-1950 Referring Provider:   Conrad Ports Pulmonary Rehab Walk Test from 08/23/2023 in [facility] for Heart, Vascular, & Lung Health  Referring Provider Bina    Encounter Date: 09/07/2023  Check In:  Session Check In - 09/07/23 1410       Check-In   Supervising physician immediately available to respond to emergencies [facility] MD immediately available    Physician(s) Damien Braver, NP    Location [facility]    Staff Present Johnnie Moats, MS, ACSM-CEP, Exercise Physiologist;Mary Harvy, RN, BSN;Randi Midge BS, ACSM-CEP, Exercise Physiologist;Samantha Belarus, RD, Maximo Sharps, RT    Virtual Visit No    Medication changes reported     No    Fall or balance concerns reported    No    Tobacco Cessation No Change    Warm-up and Cool-down Performed as group-led instruction    Resistance Training Performed Yes    VAD Patient? No    PAD/SET Patient? No      Pain Assessment   Currently in Pain? No/denies    Multiple Pain Sites No          Capillary Blood Glucose: No results found for this or any previous visit (from the past 24 hours).   Exercise Prescription Changes - 09/07/23 1500       Response to Exercise   Blood Pressure (Admit) 112/56    Blood Pressure (Exercise) 148/68    Blood Pressure (Exit) 120/60    Heart Rate (Admit) 75 bpm    Heart Rate (Exercise) 112 bpm    Heart Rate (Exit) 85 bpm    Oxygen Saturation (Admit) 92 %    Oxygen Saturation (Exercise) 89 %    Oxygen Saturation (Exit) 93 %    Rating of Perceived Exertion (Exercise) 13    Perceived Dyspnea (Exercise) 2    Duration Continue with 30 min of aerobic exercise without signs/symptoms of physical distress.    Intensity THRR unchanged      Progression   Progression Continue to progress workloads to maintain intensity without signs/symptoms of physical distress.       Resistance Training   Training Prescription Yes    Weight black bands    Reps 10-15    Time 10 Minutes      Treadmill   MPH 2.5    Grade 2    Minutes 15    METs 3.4      Elliptical   Level 1    Speed 1    Minutes 15    METs 3.4          Social History   Tobacco Use  Smoking Status Never  Smokeless Tobacco Never    Goals Met:  Proper associated with RPD/PD & O2 Sat Exercise tolerated well No report of concerns or symptoms today Strength training completed today  Goals Unmet:  Not Applicable  Comments: Service time is from 1315 to 1458.    Dr. Slater Staff is Medical Director for Pulmonary Rehab at [facility].

## 2023-09-08 NOTE — Progress Notes (Signed)
 Pulmonary Individual Treatment Plan  Patient Details  Name: Jamilett Ferrante MRN: [id number] Date of Birth: November 22, 1950 Referring Provider:   Conrad Ports Pulmonary Rehab Walk Test from 08/23/2023 in [facility]  Referring Provider Bina    Initial Encounter Date:  Flowsheet Row Pulmonary Rehab Walk Test from 08/23/2023 in [facility] for Heart, Vascular, & Lung Health  Date 08/23/23    Visit Diagnosis: Severe persistent asthma, unspecified whether complicated  Patient's Home Medications on Admission:   Current Outpatient Medications:    albuterol  (PROVENTIL ) (2.5 MG/3ML) 0.083% nebulizer solution, Take 2.5 mg by nebulization as needed for wheezing or shortness of breath., Disp: , Rfl:    albuterol  (VENTOLIN  HFA) 108 (90 Base) MCG/ACT inhaler, Inhale 2 puffs into the lungs every 4 (four) hours as needed for wheezing or shortness of breath., Disp: 1 Inhaler, Rfl: 1   alendronate (FOSAMAX) 70 MG tablet, Take 70 mg by mouth every Monday. Take with a full glass of water on an empty stomach., Disp: , Rfl:    budesonide  (RHINOCORT  AQUA) 32 MCG/ACT nasal spray, Place into the nose. (Patient not taking: Reported on 08/23/2023), Disp: , Rfl:    Calcium Carb-Cholecalciferol (CALCIUM 500+D PO), Take 1 tablet by mouth daily., Disp: , Rfl:    diphenhydrAMINE  (BENADRYL ) 25 MG tablet, Take 25 mg by mouth every 6 (six) hours as needed (anxiety)., Disp: , Rfl:    feeding supplement, ENSURE ENLIVE, (ENSURE ENLIVE) LIQD, Take 237 mLs by mouth 2 (two) times daily between meals., Disp: 237 mL, Rfl: 12   guaiFENesin  (MUCINEX ) 600 MG 12 hr tablet, Take 1 tablet (600 mg total) by mouth 2 (two) times daily., Disp: 30 tablet, Rfl: 0   mometasone  (ASMANEX ) 220 MCG/INH inhaler, Inhale 1 puff into the lungs 2 (two) times daily., Disp: 1 each, Rfl: 11   Multiple Vitamins-Minerals (MULTIVITAMIN WITH MINERALS) tablet, Take 2 tablets by mouth  daily., Disp: , Rfl:    pantoprazole  (PROTONIX ) 40 MG tablet, Take 1 tablet (40 mg total) by mouth daily., Disp: 90 tablet, Rfl: 3   predniSONE  (DELTASONE ) 20 MG tablet, 2 tabs po daily x 4 days (Patient not taking: Reported on 08/23/2023), Disp: 8 tablet, Rfl: 0   sodium chloride  HYPERTONIC 3 % nebulizer solution, INHALE 1 VIAL BY VIA NEBULIZER TWICE A DAY, Disp: , Rfl:    Tiotropium Bromide -Olodaterol (STIOLTO RESPIMAT ) 2.5-2.5 MCG/ACT AERS, Inhale into the lungs daily., Disp: , Rfl:    Tiotropium Bromide -Olodaterol 2.5-2.5 MCG/ACT AERS, Inhale 2 puffs into the lungs daily., Disp: 1 each, Rfl: 11  Current Facility-Administered Medications:    0.9 %  sodium chloride  infusion, 500 mL, Intravenous, Once, Aneita Gwendlyn DASEN, MD  Past Medical History: Past Medical History:  Diagnosis Date   Anxiety    Asthma    COPD (chronic obstructive pulmonary disease) (HCC)    pt states due to [employer] service contracted COPD   Difficult intubation    Pt does not remember details over 30 years ago   Eczema    Esophageal thickening 10/13/2018   on CT- [facility]   GERD (gastroesophageal reflux disease)    Hiatal hernia    Osteoporosis    PONV (postoperative nausea and vomiting)    Pulmonary emphysema (HCC)    Pulmonary nodules    Traumatic arthritis    Umbilical hernia    Wears glasses     Tobacco Use: Social History   Tobacco Use  Smoking Status  Never  Smokeless Tobacco Never    Labs: Review Flowsheet        No data to display          Capillary Blood Glucose: Lab Results  Component Value Date   GLUCAP 99 06/16/2017     Pulmonary Assessment Scores:  Pulmonary Assessment Scores     Row Name 04/08/23 1518 08/23/23 1111       ADL UCSD   ADL Phase Exit Entry    SOB Score total 27 26      CAT Score   CAT Score 11 18      mMRC Score   mMRC Score 2 1      UCSD: Self-administered rating of dyspnea associated with activities of daily living  (ADLs) 6-point scale (0 = not at all to 5 = maximal or unable to do because of breathlessness)  Scoring Scores range from 0 to 120.  Minimally important difference is 5 units  CAT: CAT can identify the health impairment of COPD patients and is better correlated with disease progression.  CAT has a scoring range of zero to 40. The CAT score is classified into four groups of low (less than 10), medium (10 - 20), high (21-30) and very high (31-40) based on the impact level of disease on health status. A CAT score over 10 suggests significant symptoms.  A worsening CAT score could be explained by an exacerbation, poor medication adherence, poor inhaler technique, or progression of COPD or comorbid conditions.  CAT MCID is 2 points  mMRC: mMRC (Modified Medical Research Council) Dyspnea Scale is used to assess the degree of baseline functional disability in patients of respiratory disease due to dyspnea. No minimal important difference is established. A decrease in score of 1 point or greater is considered a positive change.   Pulmonary Function Assessment:  Pulmonary Function Assessment - 08/23/23 1045       Breath   Bilateral Breath Sounds Clear;Decreased    Shortness of Breath Yes;Limiting activity          Exercise Target Goals: Exercise Program Goal: Individual exercise prescription set using results from initial 6 min walk test and THRR while considering  patient's activity barriers and safety.   Exercise Prescription Goal: Initial exercise prescription builds to 30-45 minutes a day of aerobic activity, 2-3 days per week.  Home exercise guidelines will be given to patient during program as part of exercise prescription that the participant will acknowledge.  Activity Barriers & Risk Stratification:  Activity Barriers & Cardiac Risk Stratification - 08/23/23 1043       Activity Barriers & Cardiac Risk Stratification   Activity Barriers Deconditioning;Muscular  Weakness;Shortness of Breath;Arthritis   arthritis in shoulders   Cardiac Risk Stratification Moderate          6 Minute Walk:  6 Minute Walk     Row Name 04/08/23 1522 08/23/23 1117       6 Minute Walk   Phase Discharge Initial    Distance 1860 feet 1320 feet    Distance % Change 54.7 % --    Distance Feet Change 658 ft --    Walk Time 6 minutes 6 minutes    # of Rest Breaks 0 0    MPH 3.52 2.5    METS 4.36 3.05    RPE 12 9    Perceived Dyspnea  1 1    VO2 Peak 15.09 10.64    Symptoms No No    Resting HR 65  bpm 72 bpm    Resting BP 110/66 116/60    Resting Oxygen Saturation  96 % 95 %    Exercise Oxygen Saturation  during 6 min walk 91 % 92 %    Max Ex. HR 101 bpm 104 bpm    Max Ex. BP 164/68 122/64    2 Minute Post BP 140/60 108/60      Interval HR   1 Minute HR 78 86    2 Minute HR 95 93    3 Minute HR 92 96    4 Minute HR 101 104    5 Minute HR 101 101    6 Minute HR 101 109    2 Minute Post HR 65 68    Interval Heart Rate? Yes Yes      Interval Oxygen   Interval Oxygen? -- Yes    Baseline Oxygen Saturation % 96 % 95 %    1 Minute Oxygen Saturation % 94 % 94 %    1 Minute Liters of Oxygen 0 L 0 L    2 Minute Oxygen Saturation % 96 % 92 %    2 Minute Liters of Oxygen 0 L 0 L    3 Minute Oxygen Saturation % 91 % 93 %    3 Minute Liters of Oxygen 0 L 0 L    4 Minute Oxygen Saturation % 91 % 94 %    4 Minute Liters of Oxygen 0 L 0 L    5 Minute Oxygen Saturation % 91 % 94 %    5 Minute Liters of Oxygen 0 L 0 L    6 Minute Oxygen Saturation % 91 % 94 %    6 Minute Liters of Oxygen 0 L 0 L    2 Minute Post Oxygen Saturation % 94 % 96 %    2 Minute Post Liters of Oxygen 0 L 0 L       Oxygen Initial Assessment:  Oxygen Initial Assessment - 08/23/23 1044       Home Oxygen   Home Oxygen Device None    Sleep Oxygen Prescription None    Home Exercise Oxygen Prescription None    Home Resting Oxygen Prescription None      Initial 6 min Walk   Oxygen  Used None      Program Oxygen Prescription   Program Oxygen Prescription None      Intervention   Short Term Goals To learn and understand importance of monitoring SPO2 with pulse oximeter and demonstrate accurate use of the pulse oximeter.;To learn and understand importance of maintaining oxygen saturations>88%;To learn and demonstrate proper pursed lip breathing techniques or other breathing techniques. ;To learn and demonstrate proper use of respiratory medications    Long  Term Goals Maintenance of O2 saturations>88%;Compliance with respiratory medication;Verbalizes importance of monitoring SPO2 with pulse oximeter and return demonstration;Exhibits proper breathing techniques, such as pursed lip breathing or other method taught during program session;Demonstrates proper use of MDI's          Oxygen Re-Evaluation:  Oxygen Re-Evaluation     Row Name 03/16/23 0836 08/31/23 1159           Program Oxygen Prescription   Program Oxygen Prescription None None        Home Oxygen   Home Oxygen Device None None      Sleep Oxygen Prescription None None      Home Exercise Oxygen Prescription None None      Home Resting  Oxygen Prescription None None        Goals/Expected Outcomes   Short Term Goals To learn and understand importance of monitoring SPO2 with pulse oximeter and demonstrate accurate use of the pulse oximeter.;To learn and understand importance of maintaining oxygen saturations>88%;To learn and demonstrate proper pursed lip breathing techniques or other breathing techniques. ;To learn and demonstrate proper use of respiratory medications To learn and understand importance of monitoring SPO2 with pulse oximeter and demonstrate accurate use of the pulse oximeter.;To learn and understand importance of maintaining oxygen saturations>88%;To learn and demonstrate proper pursed lip breathing techniques or other breathing techniques. ;To learn and demonstrate proper use of respiratory  medications      Long  Term Goals Maintenance of O2 saturations>88%;Compliance with respiratory medication;Verbalizes importance of monitoring SPO2 with pulse oximeter and return demonstration;Exhibits proper breathing techniques, such as pursed lip breathing or other method taught during program session;Demonstrates proper use of MDI's Maintenance of O2 saturations>88%;Compliance with respiratory medication;Verbalizes importance of monitoring SPO2 with pulse oximeter and return demonstration;Exhibits proper breathing techniques, such as pursed lip breathing or other method taught during program session;Demonstrates proper use of MDI's      Goals/Expected Outcomes Compliance and understanding of oxygen saturation and breathing techniques to decrease shortness of breath Compliance and understanding of oxygen saturation and breathing techniques to decrease shortness of breath         Oxygen Discharge (Final Oxygen Re-Evaluation):  Oxygen Re-Evaluation - 08/31/23 1159       Program Oxygen Prescription   Program Oxygen Prescription None      Home Oxygen   Home Oxygen Device None    Sleep Oxygen Prescription None    Home Exercise Oxygen Prescription None    Home Resting Oxygen Prescription None      Goals/Expected Outcomes   Short Term Goals To learn and understand importance of monitoring SPO2 with pulse oximeter and demonstrate accurate use of the pulse oximeter.;To learn and understand importance of maintaining oxygen saturations>88%;To learn and demonstrate proper pursed lip breathing techniques or other breathing techniques. ;To learn and demonstrate proper use of respiratory medications    Long  Term Goals Maintenance of O2 saturations>88%;Compliance with respiratory medication;Verbalizes importance of monitoring SPO2 with pulse oximeter and return demonstration;Exhibits proper breathing techniques, such as pursed lip breathing or other method taught during program session;Demonstrates proper  use of MDI's    Goals/Expected Outcomes Compliance and understanding of oxygen saturation and breathing techniques to decrease shortness of breath          Initial Exercise Prescription:  Initial Exercise Prescription - 08/23/23 1100       Date of Initial Exercise RX and Referring Provider   Date 08/23/23    Referring Provider Bina    Expected Discharge Date 11/18/23      Treadmill   MPH 2.5    Grade 1    Minutes 15    METs 3.6      Elliptical   Level 1    Speed 1    Minutes 15    METs 3      Prescription Details   Frequency (times per week) 2    Duration Progress to 30 minutes of continuous aerobic without signs/symptoms of physical distress      Intensity   THRR 40-80% of Max Heartrate 59-118    Ratings of Perceived Exertion 11-13    Perceived Dyspnea 0-4      Progression   Progression Continue progressive overload as per policy without signs/symptoms or  physical distress.      Resistance Training   Training Prescription Yes    Weight black bands    Reps 10-15          Perform Capillary Blood Glucose checks as needed.  Exercise Prescription Changes:   Exercise Prescription Changes     Row Name 03/23/23 1400 04/06/23 1500 09/07/23 1500         Response to Exercise   Blood Pressure (Admit) 108/66 102/52 112/56     Blood Pressure (Exercise) 162/66 142/70 148/68     Blood Pressure (Exit) 98/56 118/58 120/60     Heart Rate (Admit) 73 bpm 62 bpm 75 bpm     Heart Rate (Exercise) 100 bpm 103 bpm 112 bpm     Heart Rate (Exit) 63 bpm 79 bpm 85 bpm     Oxygen Saturation (Admit) 94 % 95 % 92 %     Oxygen Saturation (Exercise) 93 % 92 % 89 %     Oxygen Saturation (Exit) 92 % 94 % 93 %     Rating of Perceived Exertion (Exercise) 13 13 13      Perceived Dyspnea (Exercise) 3 2 2      Duration Continue with 30 min of aerobic exercise without signs/symptoms of physical distress. Continue with 30 min of aerobic exercise without signs/symptoms of physical distress.  Continue with 30 min of aerobic exercise without signs/symptoms of physical distress.     Intensity THRR unchanged THRR unchanged THRR unchanged       Progression   Progression Continue to progress workloads to maintain intensity without signs/symptoms of physical distress. -- Continue to progress workloads to maintain intensity without signs/symptoms of physical distress.       Resistance Training   Training Prescription Yes Yes Yes     Weight black bands black bands black bands     Reps 10-15 10-15 10-15     Time 10 Minutes 10 Minutes 10 Minutes       Interval Training   Interval Training No -- --       Treadmill   MPH 2.6 2.9 2.5     Grade 3 3 2      Minutes 15 15 15      METs 4.07 4.2 3.4       Recumbant Elliptical   Level 5 5 --     Minutes 15 15 --     METs 6 4.7 --       Elliptical   Level -- -- 1     Speed -- -- 1     Minutes -- -- 15     METs -- -- 3.4        Exercise Comments:   Exercise Comments     Row Name 08/31/23 1519           Exercise Comments Pt completed her first day of group exercise. She exercised on the upright elliptical for 15 min, level 1, incline 1, METs 5.1. She then walked the treadmill for 15 min, 2.5 mph, 2 % incline, METs 3.4. She tolerated well. Performed warm up and cool down with black bands although they were difficult for her. She understands METs.          Exercise Goals and Review:   Exercise Goals     Row Name 08/23/23 1035             Exercise Goals   Increase Physical Activity Yes       Intervention Provide advice, education,  support and counseling about physical activity/exercise needs.;Develop an individualized exercise prescription for aerobic and resistive training based on initial evaluation findings, risk stratification, comorbidities and participant's personal goals.       Expected Outcomes Short Term: Attend rehab on a regular basis to increase amount of physical activity.;Long Term: Add in home exercise to  make exercise part of routine and to increase amount of physical activity.;Long Term: Exercising regularly at least 3-5 days a week.       Increase Strength and Stamina Yes       Intervention Provide advice, education, support and counseling about physical activity/exercise needs.;Develop an individualized exercise prescription for aerobic and resistive training based on initial evaluation findings, risk stratification, comorbidities and participant's personal goals.       Expected Outcomes Short Term: Increase workloads from initial exercise prescription for resistance, speed, and METs.;Short Term: Perform resistance training exercises routinely during rehab and add in resistance training at home;Long Term: Improve cardiorespiratory fitness, muscular endurance and strength as measured by increased METs and functional capacity ( )       Able to understand and use rate of perceived exertion (RPE) scale Yes       Intervention Provide education and explanation on how to use RPE scale       Expected Outcomes Short Term: Able to use RPE daily in rehab to express subjective intensity level;Long Term:  Able to use RPE to guide intensity level when exercising independently       Able to understand and use Dyspnea scale Yes       Intervention Provide education and explanation on how to use Dyspnea scale       Expected Outcomes Short Term: Able to use Dyspnea scale daily in rehab to express subjective sense of shortness of breath during exertion;Long Term: Able to use Dyspnea scale to guide intensity level when exercising independently       Knowledge and understanding of Target Heart Rate Range (THRR) Yes       Intervention Provide education and explanation of THRR including how the numbers were predicted and where they are located for reference       Expected Outcomes Short Term: Able to state/look up THRR;Long Term: Able to use THRR to govern intensity when exercising independently;Short Term: Able to use  daily as guideline for intensity in rehab       Understanding of Exercise Prescription Yes       Intervention Provide education, explanation, and written materials on patient's individual exercise prescription       Expected Outcomes Short Term: Able to explain program exercise prescription;Long Term: Able to explain home exercise prescription to exercise independently          Exercise Goals Re-Evaluation :  Exercise Goals Re-Evaluation     Row Name 03/16/23 0831 04/08/23 1526 08/31/23 1159         Exercise Goal Re-Evaluation   Exercise Goals Review Increase Physical Activity;Able to understand and use Dyspnea scale;Understanding of Exercise Prescription;Increase Strength and Stamina;Knowledge and understanding of Target Heart Rate Range (THRR);Able to understand and use rate of perceived exertion (RPE) scale Increase Physical Activity;Able to understand and use Dyspnea scale;Understanding of Exercise Prescription;Increase Strength and Stamina;Knowledge and understanding of Target Heart Rate Range (THRR);Able to understand and use rate of perceived exertion (RPE) scale Increase Physical Activity;Able to understand and use Dyspnea scale;Understanding of Exercise Prescription;Increase Strength and Stamina;Knowledge and understanding of Target Heart Rate Range (THRR);Able to understand and use rate of perceived exertion (RPE)  scale     Comments Kylii has completed 15 group exercise sessions. She recently has had vertigo therefore needed to change equipment and decrease her intensity. She is exercising on the recumbent stepper for 15 min, level 3, METs 2.9. She then is exercising on the recumbent bike for 15 min, level 3, METs 2.5. She will return to original equipment once her vertigo is resolved. She is using 7.3 lb black bands for resistance training. Jashayla completed 22 group exercise sessions. Her max METs on the treadmill were 4.2. Her max METs on the recumbent elliptical were 6.0. She  increased her 6 min walk test by 54.7%, 658 ft. She also decreased her SOB and CAT scale. Her grip strength increased from 29 to 31 kg. She has been motivated and successful. Hadar is scheduled to begin group exercise today. Will monitor for progression.     Expected Outcomes Through exercise at rehab and home, the patient will decrease shortness of breath with daily activities and feel confident in carrying out an exercise regimen at home. Through exercise at rehab and home, the patient will decrease shortness of breath with daily activities and feel confident in carrying out an exercise regimen at home. Through exercise at rehab and home, the patient will decrease shortness of breath with daily activities and feel confident in carrying out an exercise regimen at home.        Discharge Exercise Prescription (Final Exercise Prescription Changes):  Exercise Prescription Changes - 09/07/23 1500       Response to Exercise   Blood Pressure (Admit) 112/56    Blood Pressure (Exercise) 148/68    Blood Pressure (Exit) 120/60    Heart Rate (Admit) 75 bpm    Heart Rate (Exercise) 112 bpm    Heart Rate (Exit) 85 bpm    Oxygen Saturation (Admit) 92 %    Oxygen Saturation (Exercise) 89 %    Oxygen Saturation (Exit) 93 %    Rating of Perceived Exertion (Exercise) 13    Perceived Dyspnea (Exercise) 2    Duration Continue with 30 min of aerobic exercise without signs/symptoms of physical distress.    Intensity THRR unchanged      Progression   Progression Continue to progress workloads to maintain intensity without signs/symptoms of physical distress.      Resistance Training   Training Prescription Yes    Weight black bands    Reps 10-15    Time 10 Minutes      Treadmill   MPH 2.5    Grade 2    Minutes 15    METs 3.4      Elliptical   Level 1    Speed 1    Minutes 15    METs 3.4          Nutrition:  Target Goals: Understanding of nutrition guidelines, daily intake of sodium  1500mg , cholesterol 200mg , calories 30% from fat and 7% or less from saturated fats, daily to have 5 or more servings of fruits and vegetables.  Biometrics:    Nutrition Therapy Plan and Nutrition Goals:  Nutrition Therapy & Goals - 09/02/23 1405       Nutrition Therapy   Diet General Healthy Diet      Personal Nutrition Goals   Nutrition Goal Patient to maintain diet quality by using the plate method as a guide for meal planning to include lean protein/plant protein, fruits, vegetables, whole grains, nonfat dairy as part of a well-balanced diet.    Personal Goal #  2 Patient to maintain weight throughout duration of pulmonary rehab.    Comments Patient has medical history of COPD, bronchiectasis, acute and chronic respiratory failure, severe persistent astham. Evoleth reports following a regular diet and motivation to maintain her weight. She has a history of using Ensure Enlive supplements 2x/day (350kcals, 930g protein each). She has previously completed pulmonary rehab in 2017, March 2023, February 2025. Patient will benefit from participation in pulmonary rehab for nutrition and exercise support.      Intervention Plan   Intervention Prescribe, educate and counsel regarding individualized specific dietary modifications aiming towards targeted core components such as weight, hypertension, lipid management, diabetes, heart failure and other comorbidities.;Nutrition handout(s) given to patient.    Expected Outcomes Short Term Goal: Understand basic principles of dietary content, such as calories, fat, sodium, cholesterol and nutrients.;Long Term Goal: Adherence to prescribed nutrition plan.          Nutrition Assessments:  Nutrition Assessments - 04/08/23 1519       Rate Your Plate Scores   Post Score 50         MEDIFICTS Score Key: >=70 Need to make dietary changes  40-70 Heart Healthy Diet <= 40 Therapeutic Level Cholesterol Diet  Flowsheet Row PULMONARY REHAB OTHER  RESPIRATORY from 09/02/2023 in [facility] for Heart, Vascular, & Lung Health  Picture Your Plate Total Score on Admission 63   Picture Your Plate Scores: <59 Unhealthy dietary pattern with much room for improvement. 41-50 Dietary pattern unlikely to meet recommendations for good health and room for improvement. 51-60 More healthful dietary pattern, with some room for improvement.  >60 Healthy dietary pattern, although there may be some specific behaviors that could be improved.    Nutrition Goals Re-Evaluation:  Nutrition Goals Re-Evaluation     Row Name 09/02/23 1405             Goals   Current Weight 128 lb 4.9 oz (58.2 kg)       Expected Outcome Patient has medical history of COPD, bronchiectasis, acute and chronic respiratory failure, severe persistent astham. Yamilka reports following a regular diet and motivation to maintain her weight. She has a history of using Ensure Enlive supplements 2x/day (350kcals, 930g protein each). She has previously completed pulmonary rehab in 2017, March 2023, February 2025. Patient will benefit from participation in pulmonary rehab for nutrition and exercise support.          Nutrition Goals Discharge (Final Nutrition Goals Re-Evaluation):  Nutrition Goals Re-Evaluation - 09/02/23 1405       Goals   Current Weight 128 lb 4.9 oz (58.2 kg)    Expected Outcome Patient has medical history of COPD, bronchiectasis, acute and chronic respiratory failure, severe persistent astham. Milayah reports following a regular diet and motivation to maintain her weight. She has a history of using Ensure Enlive supplements 2x/day (350kcals, 930g protein each). She has previously completed pulmonary rehab in 2017, March 2023, February 2025. Patient will benefit from participation in pulmonary rehab for nutrition and exercise support.          Psychosocial: Target Goals: Acknowledge presence or absence of significant depression and/or  stress, maximize coping skills, provide positive support system. Participant is able to verbalize types and ability to use techniques and skills needed for reducing stress and depression.  Initial Review & Psychosocial Screening:  Initial Psych Review & Screening - 08/23/23 1041       Initial Review   Current issues with None Identified  Family Dynamics   Good Support System? Yes    Comments Pt has good support through her family and church.      Barriers   Psychosocial barriers to participate in program There are no identifiable barriers or psychosocial needs.      Screening Interventions   Interventions Encouraged to exercise          Quality of Life Scores:  Scores of 19 and below usually indicate a poorer quality of life in these areas.  A difference of  2-3 points is a clinically meaningful difference.  A difference of 2-3 points in the total score of the Quality of Life Index has been associated with significant improvement in overall quality of life, self-image, physical symptoms, and general health in studies assessing change in quality of life.  PHQ-9: Review Flowsheet  More data exists      08/23/2023 04/08/2023 01/08/2023 11/21/2021 05/29/2021  Depression screen PHQ 2/9  Decreased Interest 0 0 0 0 0 0  Down, Depressed, Hopeless 0 0 0 0 0 0  PHQ - 2 Score 0 0 0 0 0 0  Altered sleeping 0 0 0 - 0  Tired, decreased energy 0 0 0 - 0  Change in appetite 0 0 0 - 0  Feeling bad or failure about yourself  0 0 0 - 0  Trouble concentrating 0 0 0 - 0  Moving slowly or fidgety/restless 0 0 0 - 0  Suicidal thoughts 0 0 0 - 0  PHQ-9 Score 0 0 0 - 0  Difficult doing work/chores Not difficult at all Not difficult at all Not difficult at all - Not difficult at all    Details       Multiple values from one day are sorted in reverse-chronological order        Interpretation of Total Score  Total Score Depression Severity:  1-4 = Minimal depression, 5-9 = Mild depression,  10-14 = Moderate depression, 15-19 = Moderately severe depression, 20-27 = Severe depression   Psychosocial Evaluation and Intervention:  Psychosocial Evaluation - 08/23/23 1113       Psychosocial Evaluation & Interventions   Interventions Encouraged to exercise with the program and follow exercise prescription    Comments Garrie denies any psychosocial barriers or concerns at this time    Expected Outcomes For Caly to participate in PR free of any psychosocial barriers or concerns    Continue Psychosocial Services  No Follow up required          Psychosocial Re-Evaluation:  Psychosocial Re-Evaluation     Row Name 03/15/23 0908 04/09/23 0821 09/06/23 1213         Psychosocial Re-Evaluation   Current issues with None Identified None Identified None Identified     Comments At monthly re-evaluation, Aldora continues to deny any barriers or psychosocial concerns at this time Willadean graduated the PR program completing 22 sessions. At the time of graduation, PHQ 2& 9 scores did not change from initial assessment, scores 0/0. Roben continued to deny any psychosocial concerns. Monthly psychosocial re-evaluation is as follows: Coni has attended 2 sessions so far. She denies any psychosocial barriers or concerns. We will continue to monitor and will assess her needs.     Expected Outcomes For Uriyah to continue to be free of psychosocial concerns while participating in pulmonary rehab. For Takeysha to continue to be free of psychosocial concerns post PR For Kelbi to be free of any psy/soc barriers of concerns     Interventions  Encouraged to attend Pulmonary Rehabilitation for the exercise -- Encouraged to attend Pulmonary Rehabilitation for the exercise     Continue Psychosocial Services  No Follow up required No Follow up required No Follow up required        Psychosocial Discharge (Final Psychosocial Re-Evaluation):  Psychosocial Re-Evaluation - 09/06/23 1213        Psychosocial Re-Evaluation   Current issues with None Identified    Comments Monthly psychosocial re-evaluation is as follows: Danyal has attended 2 sessions so far. She denies any psychosocial barriers or concerns. We will continue to monitor and will assess her needs.    Expected Outcomes For Kimberla to be free of any psy/soc barriers of concerns    Interventions Encouraged to attend Pulmonary Rehabilitation for the exercise    Continue Psychosocial Services  No Follow up required          Education: Education Goals: Education classes will be provided on a weekly basis, covering required topics. Participant will state understanding/return demonstration of topics presented.  Learning Barriers/Preferences:  Learning Barriers/Preferences - 08/23/23 1041       Learning Barriers/Preferences   Learning Barriers Sight;Cultural/Spiritual    Learning Preferences Skilled Demonstration;Written Material;Group Instruction;Individual Instruction          Education Topics: Know Your Numbers Group instruction that is supported by a PowerPoint presentation. Instructor discusses importance of knowing and understanding resting, exercise, and post-exercise oxygen saturation, heart rate, and blood pressure. Oxygen saturation, heart rate, blood pressure, rating of perceived exertion, and dyspnea are reviewed along with a normal range for these values.  Flowsheet Row PULMONARY REHAB OTHER RESPIRATORY from 03/04/2023 in [facility] for Heart, Vascular, & Lung Health  Date 03/04/23  Educator EP  Instruction Review Code 1- Verbalizes Understanding    Exercise for the Pulmonary Patient Group instruction that is supported by a PowerPoint presentation. Instructor discusses benefits of exercise, core components of exercise, frequency, duration, and intensity of an exercise routine, importance of utilizing pulse oximetry during exercise, safety while exercising, and options of places  to exercise outside of rehab.  Flowsheet Row PULMONARY REHAB OTHER RESPIRATORY from 02/25/2023 in [facility] for Heart, Vascular, & Lung Health  Date 02/25/23  Educator EP  Instruction Review Code 1- Verbalizes Understanding    MET Level  Group instruction provided by PowerPoint, verbal discussion, and written material to support subject matter. Instructor reviews what METs are and how to increase METs.  Flowsheet Row PULMONARY REHAB OTHER RESPIRATORY from 01/21/2023 in [facility] for Heart, Vascular, & Lung Health  Date 01/21/23  Educator EP  Instruction Review Code 1- Verbalizes Understanding    Pulmonary Medications Verbally interactive group education provided by instructor with focus on inhaled medications and proper administration. Flowsheet Row PULMONARY REHAB OTHER RESPIRATORY from 02/18/2023 in [facility] for Heart, Vascular, & Lung Health  Date 02/18/23  Educator RT  Instruction Review Code 1- Verbalizes Understanding    Anatomy and Physiology of the Respiratory System Group instruction provided by PowerPoint, verbal discussion, and written material to support subject matter. Instructor reviews respiratory cycle and anatomical components of the respiratory system and their functions. Instructor also reviews differences in obstructive and restrictive respiratory diseases with examples of each.  Flowsheet Row PULMONARY REHAB OTHER RESPIRATORY from 02/11/2023 in [facility] for Heart, Vascular, & Lung Health  Date 02/11/23  Educator RT  Instruction Review Code 1- Verbalizes Understanding    Oxygen  Safety Group instruction provided by PowerPoint, verbal discussion, and written material to support subject matter. There is an overview of "What is Oxygen" and "Why do we need it".  Instructor also reviews how to create a safe environment for oxygen use, the importance of using oxygen  as prescribed, and the risks of noncompliance. There is a brief discussion on traveling with oxygen and resources the patient may utilize. Flowsheet Row PULMONARY REHAB OTHER RESPIRATORY from 03/11/2023 in [facility] for Heart, Vascular, & Lung Health  Date 03/11/23  Educator RN  Instruction Review Code 1- Verbalizes Understanding    Oxygen Use Group instruction provided by PowerPoint, verbal discussion, and written material to discuss how supplemental oxygen is prescribed and different types of oxygen supply systems. Resources for more information are provided.  Flowsheet Row PULMONARY REHAB OTHER RESPIRATORY from 09/02/2023 in [facility] for Heart, Vascular, & Lung Health  Date 09/02/23  Educator RN  Instruction Review Code 1- Verbalizes Understanding    Breathing Techniques Group instruction that is supported by demonstration and informational handouts. Instructor discusses the benefits of pursed lip and diaphragmatic breathing and detailed demonstration on how to perform both.  Flowsheet Row PULMONARY REHAB OTHER RESPIRATORY from 03/25/2023 in [facility] for Heart, Vascular, & Lung Health  Date 03/25/23  Educator RN  Instruction Review Code 1- Verbalizes Understanding     Risk Factor Reduction Group instruction that is supported by a PowerPoint presentation. Instructor discusses the definition of a risk factor, different risk factors for pulmonary disease, and how the heart and lungs work together. Flowsheet Row PULMONARY REHAB OTHER RESPIRATORY from 01/14/2023 in [facility] for Heart, Vascular, & Lung Health  Date 01/14/23  Educator EP  Instruction Review Code 1- Verbalizes Understanding    Pulmonary Diseases Group instruction provided by PowerPoint, verbal discussion, and written material to support subject matter. Instructor gives an overview of the different type of pulmonary  diseases. There is also a discussion on risk factors and symptoms as well as ways to manage the diseases. Flowsheet Row PULMONARY REHAB OTHER RESPIRATORY from 02/04/2023 in [facility] for Heart, Vascular, & Lung Health  Date 02/04/23  Educator RT  Instruction Review Code 1- Verbalizes Understanding    Stress and Energy Conservation Group instruction provided by PowerPoint, verbal discussion, and written material to support subject matter. Instructor gives an overview of stress and the impact it can have on the body. Instructor also reviews ways to reduce stress. There is also a discussion on energy conservation and ways to conserve energy throughout the day.   Warning Signs and Symptoms Group instruction provided by PowerPoint, verbal discussion, and written material to support subject matter. Instructor reviews warning signs and symptoms of stroke, heart attack, cold and flu. Instructor also reviews ways to prevent the spread of infection.   Other Education Group or individual verbal, written, or video instructions that support the educational goals of the pulmonary rehab program. Flowsheet Row PULMONARY REHAB CHRONIC OBSTRUCTIVE PULMONARY DISEASE from 05/01/2021 in [facility] for Heart, Vascular, & Lung Health  Date 04/17/21  Educator --  [facility] Plate]     Knowledge Questionnaire Score:  Knowledge Questionnaire Score - 08/23/23 1111       Knowledge Questionnaire Score   Pre Score 16/18          Core Components/Risk Factors/Patient Goals at Admission:  Personal Goals and Risk Factors at Admission - 09/06/23  1222       Core Components/Risk Factors/Patient Goals on Admission    Weight Management Yes;Weight Maintenance    Intervention Weight Management: Develop a combined nutrition and exercise program designed to reach desired caloric intake, while maintaining appropriate intake of nutrient and fiber, sodium and fats, and appropriate  energy expenditure required for the weight goal.;Weight Management: Provide education and appropriate resources to help participant work on and attain dietary goals.;Weight Management/Obesity: Establish reasonable short term and long term weight goals.;Obesity: Provide education and appropriate resources to help participant work on and attain dietary goals.    Expected Outcomes Short Term: Continue to assess and modify interventions until short term weight is achieved;Long Term: Adherence to nutrition and physical activity/exercise program aimed toward attainment of established weight goal;Weight Maintenance: Understanding of the daily nutrition guidelines, which includes 25-35% calories from fat, 7% or less cal from saturated fats, less than 200mg  cholesterol, less than 1.5gm of sodium, & 5 or more servings of fruits and vegetables daily;Understanding recommendations for meals to include 15-35% energy as protein, 25-35% energy from fat, 35-60% energy from carbohydrates, less than 200mg  of dietary cholesterol, 20-35 gm of total fiber daily;Understanding of distribution of calorie intake throughout the day with the consumption of 4-5 meals/snacks    Improve shortness of breath with ADL's Yes    Intervention Provide education, individualized exercise plan and daily activity instruction to help decrease symptoms of SOB with activities of daily living.    Expected Outcomes Short Term: Improve cardiorespiratory fitness to achieve a reduction of symptoms when performing ADLs;Long Term: Be able to perform more ADLs without symptoms or delay the onset of symptoms          Core Components/Risk Factors/Patient Goals Review:   Goals and Risk Factor Review     Row Name 03/15/23 0908 04/09/23 0826 09/06/23 1222         Core Components/Risk Factors/Patient Goals Review   Personal Goals Review Weight Management/Obesity;Improve shortness of breath with ADL's Weight Management/Obesity;Improve shortness of breath  with ADL's Weight Management/Obesity;Improve shortness of breath with ADL's;Develop more efficient breathing techniques such as purse lipped breathing and diaphragmatic breathing and practicing self-pacing with activity.     Review Monthly review of patient's Core Components/Risk Factors/Patient Goals are as follows: Goal in progress for weight maintenance. Tyne has maintained her weight since last evaluation. She is knowledgeable of healthy eating habits and has incorporated a variety of foods into her daily living. Goal progressing on improving shortness of breath with ADLs. Seraphine has been able to exercise without the need for oxygen. She knows how to monitor her oxygen saturation on her own. Dreonna has been able to increase her METs and his workload. She knows how to report her rate of exertion and dyspnea level. Elza will continue to benefit from participation in PR for nutrition, education, exercise, and lifestyle modification. Review of patient's Core Components/Risk Factors/Patient Goals at graduation are as follows: Goal met for weight maintenance. Koya has maintained her weight since beginning the program. She is knowledgeable of healthy eating habits and has incorporated a variety of foods into her daily living. Goal met on improving her shortness of breath with ADLs. Konya has been able to exercise without the need for oxygen. She knows how to monitor her oxygen saturation on her own. Jayliana has been able to increase her METs and workload. She improved by 54.7% on her post . Her mets increased from 2.69 to 4.3 and grip strength increased from 29 to 31.  Naryiah's shortness of breath score decreased from 39 to 27 and her CAT score decreased from 20 to 11. We are proud of the success Jadia has made in the program! Monthly review of patient's Core Components/Risk Factors/Patient Goals are as follows: Goal in progress for improving her shortness of breath with ADLs. Dianca has  been able to maintain her oxygen saturation on room air with exertion. She completed the program last November and has participated in the program yearly for the last 2-3 years. Goal met on developing more efficient breathing techniques such as purse lipped breathing and diaphragmatic breathing; and practicing self-pacing with activity. She can initiate pursed lip breathing and is practicing diaphragmatic breathing before bed. She can self-pace herself when needed and allows herself to take breaks while walking or exercising. Goal of weight maintenance is progressing. Barbi has been within 1# since beginning the program. Ayahna will continue to benefit from PR for nutrition, education, exercise, and lifestyle modification.     Expected Outcomes For Pam to maintain her weight and improve her shortness of breath with ADLs For Rollene to maintain her weight and improve her shortness of breath with ADLs post PR Pt will show progress toward meeting expected goals and outcomes.        Core Components/Risk Factors/Patient Goals at Discharge (Final Review):   Goals and Risk Factor Review - 09/06/23 1222       Core Components/Risk Factors/Patient Goals Review   Personal Goals Review Weight Management/Obesity;Improve shortness of breath with ADL's;Develop more efficient breathing techniques such as purse lipped breathing and diaphragmatic breathing and practicing self-pacing with activity.    Review Monthly review of patient's Core Components/Risk Factors/Patient Goals are as follows: Goal in progress for improving her shortness of breath with ADLs. Blaike has been able to maintain her oxygen saturation on room air with exertion. She completed the program last November and has participated in the program yearly for the last 2-3 years. Goal met on developing more efficient breathing techniques such as purse lipped breathing and diaphragmatic breathing; and practicing self-pacing with activity. She can  initiate pursed lip breathing and is practicing diaphragmatic breathing before bed. She can self-pace herself when needed and allows herself to take breaks while walking or exercising. Goal of weight maintenance is progressing. Michol has been within 1# since beginning the program. Adriannah will continue to benefit from PR for nutrition, education, exercise, and lifestyle modification.    Expected Outcomes Pt will show progress toward meeting expected goals and outcomes.          ITP Comments: Pt is making expected progress toward Pulmonary Rehab goals after completing 3 session(s). Recommend continued exercise, life style modification, education, and utilization of breathing techniques to increase stamina and strength, while also decreasing shortness of breath with exertion.  Dr. Slater Staff is Medical Director for Pulmonary Rehab at [facility].

## 2023-09-09 ENCOUNTER — Encounter (HOSPITAL_COMMUNITY)
Admission: RE | Admit: 2023-09-09 | Discharge: 2023-09-09 | Disposition: A | Discharge status: Home / Self Care | Source: Ambulatory Visit | Attending: Pulmonary Disease | Admitting: Pulmonary Disease

## 2023-09-09 DIAGNOSIS — J4550 Severe persistent asthma, uncomplicated: Secondary | ICD-10-CM

## 2023-09-09 NOTE — Progress Notes (Signed)
 Daily Session Note  Patient Details  Name: Patricia Johnston MRN: [id number] Date of Birth: 1950/04/15 Referring Provider:   Conrad Ports Pulmonary Rehab Walk Test from 08/23/2023 in [facility] for Heart, Vascular, & Lung Health  Referring Provider Bina    Encounter Date: 09/09/2023  Check In:  Session Check In - 09/09/23 1422       Check-In   Supervising physician immediately available to respond to emergencies [facility] MD immediately available    Physician(s) Rosabel Mose, NP    Location [facility]    Staff Present Johnnie Moats, MS, ACSM-CEP, Exercise Physiologist;Mary Harvy, RN, Wetzel Sharps, RT    Virtual Visit No    Medication changes reported     No    Fall or balance concerns reported    No    Tobacco Cessation No Change    Warm-up and Cool-down Performed as group-led instruction    Resistance Training Performed Yes    VAD Patient? No    PAD/SET Patient? No      Pain Assessment   Currently in Pain? No/denies    Multiple Pain Sites No          Capillary Blood Glucose: No results found for this or any previous visit (from the past 24 hours).    Social History   Tobacco Use  Smoking Status Never  Smokeless Tobacco Never    Goals Met:  Proper associated with RPD/PD & O2 Sat Independence with exercise equipment Exercise tolerated well No report of concerns or symptoms today Strength training completed today  Goals Unmet:  Not Applicable  Comments: Service time is from 1311 to 1435.    Dr. Slater Staff is Medical Director for Pulmonary Rehab at [facility].

## 2023-09-10 HISTORY — PX: TOTAL SHOULDER REPLACEMENT: SUR1217

## 2023-09-14 ENCOUNTER — Telehealth (HOSPITAL_COMMUNITY): Payer: Self-pay

## 2023-09-14 ENCOUNTER — Encounter (HOSPITAL_COMMUNITY): Admission: RE | Admit: 2023-09-14 | Source: Ambulatory Visit

## 2023-09-14 NOTE — Telephone Encounter (Signed)
 Patient left message stating she is having shoulder surgery on Friday and her [facility] told her to stop pulmonary rehab until her shoulder heals from surgery. Cancelled PR appts for this week, marked as 'MED HOLD' in schedule.

## 2023-09-15 ENCOUNTER — Telehealth (HOSPITAL_COMMUNITY): Payer: Self-pay

## 2023-09-15 NOTE — Telephone Encounter (Signed)
 Called to follow up with Patricia Johnston since she has her shoulder surgery. Patricia Johnston's surgeon stated she would take 4-6 months to recover. I told her my recommendations were to discharge and get knew referral. Patricia Johnston voiced understanding. Will discharge pt from PR.

## 2023-09-16 ENCOUNTER — Encounter (HOSPITAL_COMMUNITY)

## 2023-09-17 NOTE — Progress Notes (Signed)
 Discharge Progress Report  Patient Details  Name: Patricia Johnston MRN: [id number] Date of Birth: Apr 19, 1950 Referring Provider:   [facility] Re[facility] Walk Test from 08/23/2023 in [facility]  Referring Provider Bina     Number of Visits: 4  Reason for Discharge:  Early Exit:  shoulder surgery  Smoking History:  Social History   Tobacco Use  Smoking Status Never  Smokeless Tobacco Never    Diagnosis:  Severe persistent asthma, unspecified whether complicated  ADL [facility]SD:  Pulmonary Assessment Scores     Row Name 04/08/23 1518 08/23/23 1111       ADL UCSD   ADL Phase Exit Entry    SOB Score total 27 26      CAT Score   CAT Score 11 18      mMRC Score   mMRC Score 2 1       Initial Exercise Prescription:  Initial Exercise Prescription - 08/23/23 1100       Date of Initial Exercise RX and Referring Provider   Date 08/23/23    Referring Provider Bina    Expected Discharge Date 11/18/23      Treadmill   MPH 2.5    Grade 1    Minutes 15    METs 3.6      Elliptical   Level 1    Speed 1    Minutes 15    METs 3      Prescription Details   Frequency (times per week) 2    Duration Progress to 30 minutes of continuous aerobic without signs/symptoms of physical distress      Intensity   THRR 40-80% of Max Heartrate 59-118    Ratings of Perceived Exertion 11-13    Perceived Dyspnea 0-4      Progression   Progression Continue progressive overload as per policy without signs/symptoms or physical distress.      Resistance Training   Training Prescription Yes    Weight black bands    Reps 10-15          Discharge Exercise Prescription (Final Exercise Prescription Changes):  Exercise Prescription Changes - 09/07/23 1500       Response to Exercise   Blood Pressure (Admit) 112/56    Blood Pressure (Exercise) 148/68    Blood Pressure (Exit) 120/60    Heart Rate (Admit) 75 bpm     Heart Rate (Exercise) 112 bpm    Heart Rate (Exit) 85 bpm    Oxygen Saturation (Admit) 92 %    Oxygen Saturation (Exercise) 89 %    Oxygen Saturation (Exit) 93 %    Rating of Perceived Exertion (Exercise) 13    Perceived Dyspnea (Exercise) 2    Duration Continue with 30 min of aerobic exercise without signs/symptoms of physical distress.    Intensity THRR unchanged      Progression   Progression Continue to progress workloads to maintain intensity without signs/symptoms of physical distress.      Resistance Training   Training Prescription Yes    Weight black bands    Reps 10-15    Time 10 Minutes      Treadmill   MPH 2.5    Grade 2    Minutes 15    METs 3.4      Elliptical   Level 1    Speed 1    Minutes 15    METs 3.4  Functional Capacity:  6 Minute Walk     Row Name 04/08/23 1522 08/23/23 1117       6 Minute Walk   Phase Discharge Initial    Distance 1860 feet 1320 feet    Distance % Change 54.7 % --    Distance Feet Change 658 ft --    Walk Time 6 minutes 6 minutes    # of Rest Breaks 0 0    MPH 3.52 2.5    METS 4.36 3.05    RPE 12 9    Perceived Dyspnea  1 1    VO2 Peak 15.09 10.64    Symptoms No No    Resting HR 65 bpm 72 bpm    Resting BP 110/66 116/60    Resting Oxygen Saturation  96 % 95 %    Exercise Oxygen Saturation  during 6 min walk 91 % 92 %    Max Ex. HR 101 bpm 104 bpm    Max Ex. BP 164/68 122/64    2 Minute Post BP 140/60 108/60      Interval HR   1 Minute HR 78 86    2 Minute HR 95 93    3 Minute HR 92 96    4 Minute HR 101 104    5 Minute HR 101 101    6 Minute HR 101 109    2 Minute Post HR 65 68    Interval Heart Rate? Yes Yes      Interval Oxygen   Interval Oxygen? -- Yes    Baseline Oxygen Saturation % 96 % 95 %    1 Minute Oxygen Saturation % 94 % 94 %    1 Minute Liters of Oxygen 0 L 0 L    2 Minute Oxygen Saturation % 96 % 92 %    2 Minute Liters of Oxygen 0 L 0 L    3 Minute Oxygen Saturation % 91 % 93  %    3 Minute Liters of Oxygen 0 L 0 L    4 Minute Oxygen Saturation % 91 % 94 %    4 Minute Liters of Oxygen 0 L 0 L    5 Minute Oxygen Saturation % 91 % 94 %    5 Minute Liters of Oxygen 0 L 0 L    6 Minute Oxygen Saturation % 91 % 94 %    6 Minute Liters of Oxygen 0 L 0 L    2 Minute Post Oxygen Saturation % 94 % 96 %    2 Minute Post Liters of Oxygen 0 L 0 L       Psychological, QOL, Others - Outcomes: PHQ 2/9:    08/23/2023   10:31 AM 04/08/2023    3:16 PM 01/08/2023    8:51 AM 11/21/2021   11:48 AM 11/21/2021   11:35 AM  Depression screen PHQ 2/9  Decreased Interest 0 0 0 0 0  Down, Depressed, Hopeless 0 0 0 0 0  PHQ - 2 Score 0 0 0 0 0  Altered sleeping 0 0 0    Tired, decreased energy 0 0 0    Change in appetite 0 0 0    Feeling bad or failure about yourself  0 0 0    Trouble concentrating 0 0 0    Moving slowly or fidgety/restless 0 0 0    Suicidal thoughts 0 0 0    PHQ-9 Score 0 0 0    Difficult doing work/chores  Not difficult at all Not difficult at all Not difficult at all      Quality of Life:   Personal Goals: Goals established at orientation with interventions provided to work toward goal.  Personal Goals and Risk Factors at Admission - 09/06/23 1222       Core Components/Risk Factors/Patient Goals on Admission    Weight Management Yes;Weight Maintenance    Intervention Weight Management: Develop a combined nutrition and exercise program designed to reach desired caloric intake, while maintaining appropriate intake of nutrient and fiber, sodium and fats, and appropriate energy expenditure required for the weight goal.;Weight Management: Provide education and appropriate resources to help participant work on and attain dietary goals.;Weight Management/Obesity: Establish reasonable short term and long term weight goals.;Obesity: Provide education and appropriate resources to help participant work on and attain dietary goals.    Expected Outcomes Short Term:  Continue to assess and modify interventions until short term weight is achieved;Long Term: Adherence to nutrition and physical activity/exercise program aimed toward attainment of established weight goal;Weight Maintenance: Understanding of the daily nutrition guidelines, which includes 25-35% calories from fat, 7% or less cal from saturated fats, less than 200mg  cholesterol, less than 1.5gm of sodium, & 5 or more servings of fruits and vegetables daily;Understanding recommendations for meals to include 15-35% energy as protein, 25-35% energy from fat, 35-60% energy from carbohydrates, less than 200mg  of dietary cholesterol, 20-35 gm of total fiber daily;Understanding of distribution of calorie intake throughout the day with the consumption of 4-5 meals/snacks    Improve shortness of breath with ADL's Yes    Intervention Provide education, individualized exercise plan and daily activity instruction to help decrease symptoms of SOB with activities of daily living.    Expected Outcomes Short Term: Improve cardiorespiratory fitness to achieve a reduction of symptoms when performing ADLs;Long Term: Be able to perform more ADLs without symptoms or delay the onset of symptoms           Personal Goals Discharge:  Goals and Risk Factor Review     Row Name 04/09/23 0826 09/06/23 1222 09/17/23 0833         Core Components/Risk Factors/Patient Goals Review   Personal Goals Review Weight Management/Obesity;Improve shortness of breath with ADL's Weight Management/Obesity;Improve shortness of breath with ADL's;Develop more efficient breathing techniques such as purse lipped breathing and diaphragmatic breathing and practicing self-pacing with activity. Weight Management/Obesity;Improve shortness of breath with ADL's     Review Review of patient's Core Components/Risk Factors/Patient Goals at graduation are as follows: Goal met for weight maintenance. Cagney has maintained her weight since beginning the  program. She is knowledgeable of healthy eating habits and has incorporated a variety of foods into her daily living. Goal met on improving her shortness of breath with ADLs. Stepanie has been able to exercise without the need for oxygen. She knows how to monitor her oxygen saturation on her own. Reeta has been able to increase her METs and workload. She improved by 54.7% on her post . Her mets increased from 2.69 to 4.3 and grip strength increased from 29 to 31. Ryiah's shortness of breath score decreased from 39 to 27 and her CAT score decreased from 20 to 11. We are proud of the success Alishea has made in the program! Monthly review of patient's Core Components/Risk Factors/Patient Goals are as follows: Goal in progress for improving her shortness of breath with ADLs. Tram has been able to maintain her oxygen saturation on room air with exertion. She completed the  program last November and has participated in the program yearly for the last 2-3 years. Goal met on developing more efficient breathing techniques such as purse lipped breathing and diaphragmatic breathing; and practicing self-pacing with activity. She can initiate pursed lip breathing and is practicing diaphragmatic breathing before bed. She can self-pace herself when needed and allows herself to take breaks while walking or exercising. Goal of weight maintenance is progressing. Keona has been within 1# since beginning the program. Maren will continue to benefit from PR for nutrition, education, exercise, and lifestyle modification. Doretha discharged from the program on 09/15/23 completing 4 sessions. She had to withdraw from the program due to shoulder surgery. Discharge review of patient's Core Components/Risk Factors/Patient Goals are as follows: Goal not met for improving her shortness of breath with ADLs. Alsha has been able to maintain her oxygen saturation on room air with exertion. She completed the program last  November and has participated in the program yearly for the last 2-3 years but has not continued exercise when she graduated the program. Goal met for weight maintenance. Arvetta was in 1.5# since beginning the program.     Expected Outcomes For Bobbijo to maintain her weight and improve her shortness of breath with ADLs post PR Pt will show progress toward meeting expected goals and outcomes. Pt will continue show progress toward meeting expected goals and outcomes post PR        Exercise Goals and Review:  Exercise Goals     Row Name 08/23/23 1035             Exercise Goals   Increase Physical Activity Yes       Intervention Provide advice, education, support and counseling about physical activity/exercise needs.;Develop an individualized exercise prescription for aerobic and resistive training based on initial evaluation findings, risk stratification, comorbidities and participant's personal goals.       Expected Outcomes Short Term: Attend rehab on a regular basis to increase amount of physical activity.;Long Term: Add in home exercise to make exercise part of routine and to increase amount of physical activity.;Long Term: Exercising regularly at least 3-5 days a week.       Increase Strength and Stamina Yes       Intervention Provide advice, education, support and counseling about physical activity/exercise needs.;Develop an individualized exercise prescription for aerobic and resistive training based on initial evaluation findings, risk stratification, comorbidities and participant's personal goals.       Expected Outcomes Short Term: Increase workloads from initial exercise prescription for resistance, speed, and METs.;Short Term: Perform resistance training exercises routinely during rehab and add in resistance training at home;Long Term: Improve cardiorespiratory fitness, muscular endurance and strength as measured by increased METs and functional capacity ( )       Able to  understand and use rate of perceived exertion (RPE) scale Yes       Intervention Provide education and explanation on how to use RPE scale       Expected Outcomes Short Term: Able to use RPE daily in rehab to express subjective intensity level;Long Term:  Able to use RPE to guide intensity level when exercising independently       Able to understand and use Dyspnea scale Yes       Intervention Provide education and explanation on how to use Dyspnea scale       Expected Outcomes Short Term: Able to use Dyspnea scale daily in rehab to express subjective sense of shortness of breath during exertion;Long Term:  Able to use Dyspnea scale to guide intensity level when exercising independently       Knowledge and understanding of Target Heart Rate Range (THRR) Yes       Intervention Provide education and explanation of THRR including how the numbers were predicted and where they are located for reference       Expected Outcomes Short Term: Able to state/look up THRR;Long Term: Able to use THRR to govern intensity when exercising independently;Short Term: Able to use daily as guideline for intensity in rehab       Understanding of Exercise Prescription Yes       Intervention Provide education, explanation, and written materials on patient's individual exercise prescription       Expected Outcomes Short Term: Able to explain program exercise prescription;Long Term: Able to explain home exercise prescription to exercise independently          Exercise Goals Re-Evaluation:  Exercise Goals Re-Evaluation     Row Name 04/08/23 1526 08/31/23 1159 09/16/23 1540         Exercise Goal Re-Evaluation   Exercise Goals Review Increase Physical Activity;Able to understand and use Dyspnea scale;Understanding of Exercise Prescription;Increase Strength and Stamina;Knowledge and understanding of Target Heart Rate Range (THRR);Able to understand and use rate of perceived exertion (RPE) scale Increase Physical  Activity;Able to understand and use Dyspnea scale;Understanding of Exercise Prescription;Increase Strength and Stamina;Knowledge and understanding of Target Heart Rate Range (THRR);Able to understand and use rate of perceived exertion (RPE) scale Increase Physical Activity;Able to understand and use Dyspnea scale;Understanding of Exercise Prescription;Increase Strength and Stamina;Knowledge and understanding of Target Heart Rate Range (THRR);Able to understand and use rate of perceived exertion (RPE) scale     Comments Rollene completed 22 group exercise sessions. Her max METs on the treadmill were 4.2. Her max METs on the recumbent elliptical were 6.0. She increased her 6 min walk test by 54.7%, 658 ft. She also decreased her SOB and CAT scale. Her grip strength increased from 29 to 31 kg. She has been motivated and successful. Mayari is scheduled to begin group exercise today. Will monitor for progression. Pt completed 4 exercise sessions however needed to be discharge early due to the need for shoulder surgery. She exercised on the upright elliptical for 15 min, METs 5.1. She also walked on the treadmill for 15 min, METs 3.4. She will recover and then get another referral.     Expected Outcomes Through exercise at rehab and home, the patient will decrease shortness of breath with daily activities and feel confident in carrying out an exercise regimen at home. Through exercise at rehab and home, the patient will decrease shortness of breath with daily activities and feel confident in carrying out an exercise regimen at home. Through exercise at rehab and home, the patient will decrease shortness of breath with daily activities and feel confident in carrying out an exercise regimen at home.        Nutrition & Weight - Outcomes:    Nutrition:  Nutrition Therapy & Goals - 09/02/23 1405       Nutrition Therapy   Diet General Healthy Diet      Personal Nutrition Goals   Nutrition Goal Patient to  maintain diet quality by using the plate method as a guide for meal planning to include lean protein/plant protein, fruits, vegetables, whole grains, nonfat dairy as part of a well-balanced diet.    Personal Goal #2 Patient to maintain weight throughout duration of pulmonary rehab.  Comments Patient has medical history of COPD, bronchiectasis, acute and chronic respiratory failure, severe persistent astham. Nellene reports following a regular diet and motivation to maintain her weight. She has a history of using Ensure Enlive supplements 2x/day (350kcals, 930g protein each). She has previously completed pulmonary rehab in 2017, March 2023, February 2025. Patient will benefit from participation in pulmonary rehab for nutrition and exercise support.      Intervention Plan   Intervention Prescribe, educate and counsel regarding individualized specific dietary modifications aiming towards targeted core components such as weight, hypertension, lipid management, diabetes, heart failure and other comorbidities.;Nutrition handout(s) given to patient.    Expected Outcomes Short Term Goal: Understand basic principles of dietary content, such as calories, fat, sodium, cholesterol and nutrients.;Long Term Goal: Adherence to prescribed nutrition plan.          Nutrition Discharge:  Nutrition Assessments - 04/08/23 1519       Rate Your Plate Scores   Post Score 50          Education Questionnaire Score:  Knowledge Questionnaire Score - 08/23/23 1111       Knowledge Questionnaire Score   Pre Score 16/18         Kaelen discharged from the program on 09/15/23 completing 4 sessions. She had to withdraw from the program due to shoulder surgery. At the time of discharge Dajae denied any psy/soc barriers or concerns. She stated she has great support from her church.   Discharge review of patient's Core Components/Risk Factors/Patient Goals are as follows: Goal not met for improving her shortness  of breath with ADLs. Carletta has been able to maintain her oxygen saturation on room air with exertion. She completed the program last November and has participated in the program yearly for the last 2-3 years but has not continued exercise when she graduated the program. Goal met for weight maintenance. Emiah was in 1.5# since beginning the program.

## 2023-09-21 ENCOUNTER — Encounter (HOSPITAL_COMMUNITY)

## 2023-09-23 ENCOUNTER — Encounter (HOSPITAL_COMMUNITY)

## 2023-09-28 ENCOUNTER — Encounter (HOSPITAL_COMMUNITY)

## 2023-09-30 ENCOUNTER — Encounter (HOSPITAL_COMMUNITY)

## 2023-10-05 ENCOUNTER — Encounter (HOSPITAL_COMMUNITY)

## 2023-10-07 ENCOUNTER — Encounter (HOSPITAL_COMMUNITY)

## 2023-10-12 ENCOUNTER — Encounter (HOSPITAL_COMMUNITY)

## 2023-10-14 ENCOUNTER — Encounter (HOSPITAL_COMMUNITY)

## 2023-10-19 ENCOUNTER — Encounter (HOSPITAL_COMMUNITY)

## 2023-10-21 ENCOUNTER — Encounter (HOSPITAL_COMMUNITY)

## 2023-10-26 ENCOUNTER — Encounter (HOSPITAL_COMMUNITY)

## 2023-10-28 ENCOUNTER — Encounter (HOSPITAL_COMMUNITY)

## 2023-11-02 ENCOUNTER — Encounter (HOSPITAL_COMMUNITY)

## 2023-11-04 ENCOUNTER — Encounter (HOSPITAL_COMMUNITY)

## 2023-11-09 ENCOUNTER — Encounter (HOSPITAL_COMMUNITY)

## 2023-11-11 ENCOUNTER — Encounter (HOSPITAL_COMMUNITY)

## 2023-11-16 ENCOUNTER — Encounter (HOSPITAL_COMMUNITY)

## 2023-11-18 ENCOUNTER — Encounter (HOSPITAL_COMMUNITY)

## 2024-01-25 ENCOUNTER — Telehealth (HOSPITAL_COMMUNITY): Payer: Self-pay

## 2024-01-25 NOTE — Telephone Encounter (Signed)
 Outside/paper referral received by Dr. [name] from [place].    [id number]   Patient confirmed interest, will pass to nurse for review.

## 2024-01-26 ENCOUNTER — Telehealth (HOSPITAL_COMMUNITY): Payer: Self-pay

## 2024-01-26 NOTE — Telephone Encounter (Signed)
 Received referral from Dr. Louann Righter from the [facility] for this pt to participate in Pulmonary Rehab with the diagnosis of bronchiectasis. Clinical review of pt follow up appt on 01/24/24 Pulmonary office note. Pt appropriate for scheduling for Pulmonary rehab. Will forward to support staff for scheduling and verification of insurance eligibility/benefits with pt consent.   Ronal [facility]

## 2024-01-31 ENCOUNTER — Telehealth (HOSPITAL_COMMUNITY): Payer: Self-pay

## 2024-01-31 NOTE — Telephone Encounter (Signed)
 PR letter

## 2024-01-31 NOTE — Telephone Encounter (Signed)
 Called patient to see if she was interested in participating in the Pulmonary Rehab Program. Patient will come in for orientation on 02/18/24 and will attend the 1:15 exercise class.  Pensions consultant.

## 2024-02-07 ENCOUNTER — Other Ambulatory Visit (HOSPITAL_COMMUNITY): Payer: Self-pay

## 2024-02-17 ENCOUNTER — Telehealth (HOSPITAL_COMMUNITY): Payer: Self-pay

## 2024-02-17 NOTE — Telephone Encounter (Signed)
 Called to confirm appt. Pt confirmed appt. Instructed pt on proper footwear. Gave directions along with department number.

## 2024-02-18 ENCOUNTER — Encounter (HOSPITAL_COMMUNITY): Payer: Self-pay

## 2024-02-18 ENCOUNTER — Encounter (HOSPITAL_COMMUNITY)
Admission: RE | Admit: 2024-02-18 | Discharge: 2024-02-18 | Disposition: A | Discharge status: Home / Self Care | Source: Ambulatory Visit | Attending: Pulmonary Disease | Admitting: Pulmonary Disease

## 2024-02-18 VITALS — BP 98/60 | HR 77 | Ht 65.0 in | Wt 129.4 lb

## 2024-02-18 DIAGNOSIS — J479 Bronchiectasis, uncomplicated: Secondary | ICD-10-CM | POA: Insufficient documentation

## 2024-02-18 NOTE — Progress Notes (Addendum)
 Pulmonary Individual Treatment Plan  Patient Details  Name: Patricia Johnston MRN: [id number] Date of Birth: [birth year] Referring Provider:   Conrad Ports Pulmonary Rehab Walk Test from 02/18/2024 in [facility]  Referring Provider [name]    Initial Encounter Date:  Flowsheet Row Pulmonary Rehab Walk Test from 02/18/2024 in [facility]  Date 02/18/24    Visit Diagnosis: Bronchiectasis without complication (HCC)  Patient's Home Medications on Admission:  Current Medications[1]  Past Medical History: Past Medical History:  Diagnosis Date   Anxiety    Asthma    COPD (chronic obstructive pulmonary disease) (HCC)    pt states due to [employer] contracted COPD   Difficult intubation    Pt does not remember details over 30 years ago   Eczema    Esophageal thickening 10/13/2018   on CT- [facility]   GERD (gastroesophageal reflux disease)    Hiatal hernia    Osteoporosis    PONV (postoperative nausea and vomiting)    Pulmonary emphysema (HCC)    Pulmonary nodules    Traumatic arthritis    Umbilical hernia    Wears glasses     Tobacco Use: Tobacco Use History[2]  Labs: Review Flowsheet        No data to display          Capillary Blood Glucose: Lab Results  Component Value Date   GLUCAP 99 06/16/2017     Pulmonary Assessment Scores:  Pulmonary Assessment Scores     Row Name 08/23/23 1111 02/18/24 1307       ADL UCSD   ADL Phase Entry Entry    SOB Score total 26 61      CAT Score   CAT Score 18 21      mMRC Score   mMRC Score 1 3      UCSD: Self-administered rating of dyspnea associated with activities of daily living (ADLs) 6-point scale (0 = not at all to 5 = maximal or unable to do because of breathlessness)  Scoring Scores range from 0 to 120.  Minimally important difference is 5  units  CAT: CAT can identify the health impairment of COPD patients and is better correlated with disease progression.  CAT has a scoring range of zero to 40. The CAT score is classified into four groups of low (less than 10), medium (10 - 20), high (21-30) and very high (31-40) based on the impact level of disease on health status. A CAT score over 10 suggests significant symptoms.  A worsening CAT score could be explained by an exacerbation, poor medication adherence, poor inhaler technique, or progression of COPD or comorbid conditions.  CAT MCID is 2 points  mMRC: mMRC (Modified Medical Research Council) Dyspnea Scale is used to assess the degree of baseline functional disability in patients of respiratory disease due to dyspnea. No minimal important difference is established. A decrease in score of 1 point or greater is considered a positive change.   Pulmonary Function Assessment:  Pulmonary Function Assessment - 02/18/24 1401       Breath   Bilateral Breath Sounds Clear    Shortness of Breath Yes;Limiting activity          Exercise Target Goals: Exercise Program Goal: Individual exercise prescription set using results from initial 6 min walk test and THRR while considering  patients activity barriers and safety.   Exercise Prescription Goal: Initial  exercise prescription builds to 30-45 minutes a day of aerobic activity, 2-3 days per week.  Home exercise guidelines will be given to patient during program as part of exercise prescription that the participant will acknowledge.  Activity Barriers & Risk Stratification:  Activity Barriers & Cardiac Risk Stratification - 02/18/24 1304       Activity Barriers & Cardiac Risk Stratification   Activity Barriers Deconditioning;Muscular Weakness;Shortness of Breath          6 Minute Walk:  6 Minute Walk     Row Name 08/23/23 1117 02/18/24 1412       6 Minute Walk   Phase Initial Initial    Distance 1320 feet 1680 feet     Walk Time 6 minutes 6 minutes    # of Rest Breaks 0 0    MPH 2.5 3.18    METS 3.05 4.47    RPE 9 12    Perceived Dyspnea  1 2    VO2 Peak 10.64 15.63    Symptoms No No    Resting HR 72 bpm 77 bpm    Resting BP 116/60 98/60    Resting Oxygen Saturation  95 % 95 %    Exercise Oxygen Saturation  during 6 min walk 92 % 85 %    Max Ex. HR 104 bpm 131 bpm    Max Ex. BP 122/64 180/72    2 Minute Post BP 108/60 124/68      Interval HR   1 Minute HR 86 81    2 Minute HR 93 116    3 Minute HR 96 126    4 Minute HR 104 127    5 Minute HR 101 129    6 Minute HR 109 131    2 Minute Post HR 68 82    Interval Heart Rate? Yes Yes      Interval Oxygen   Interval Oxygen? Yes Yes    Baseline Oxygen Saturation % 95 % 95 %    1 Minute Oxygen Saturation % 94 % 93 %    1 Minute Liters of Oxygen 0 L 0 L    2 Minute Oxygen Saturation % 92 % 90 %    2 Minute Liters of Oxygen 0 L --    3 Minute Oxygen Saturation % 93 % 89 %  3:41-4:00 O2 85%, 2L [place] applied    3 Minute Liters of Oxygen 0 L 123 L    4 Minute Oxygen Saturation % 94 % 90 %    4 Minute Liters of Oxygen 0 L 127 L    5 Minute Oxygen Saturation % 94 % 94 %    5 Minute Liters of Oxygen 0 L 129 L    6 Minute Oxygen Saturation % 94 % 94 %    6 Minute Liters of Oxygen 0 L 131 L    2 Minute Post Oxygen Saturation % 96 % 96 %    2 Minute Post Liters of Oxygen 0 L 82 L       Oxygen Initial Assessment:  Oxygen Initial Assessment - 02/18/24 1401       Home Oxygen   Home Oxygen Device None    Sleep Oxygen Prescription None    Home Exercise Oxygen Prescription None    Home Resting Oxygen Prescription None      Initial 6 min Walk   Oxygen Used Continuous    Liters per minute 2      Program Oxygen Prescription  Program Oxygen Prescription Continuous    Liters per minute 2      Intervention   Short Term Goals To learn and exhibit compliance with exercise, home and travel O2 prescription;To learn and understand importance of  maintaining oxygen saturations>88%;To learn and demonstrate proper use of respiratory medications;To learn and demonstrate proper pursed lip breathing techniques or other breathing techniques. ;To learn and understand importance of monitoring SPO2 with pulse oximeter and demonstrate accurate use of the pulse oximeter.    Long  Term Goals Exhibits compliance with exercise, home  and travel O2 prescription;Maintenance of O2 saturations>88%;Compliance with respiratory medication;Demonstrates proper use of MDIs;Exhibits proper breathing techniques, such as pursed lip breathing or other method taught during program session;Verbalizes importance of monitoring SPO2 with pulse oximeter and return demonstration          Oxygen Re-Evaluation:  Oxygen Re-Evaluation     Row Name 08/31/23 1159             Program Oxygen Prescription   Program Oxygen Prescription None         Home Oxygen   Home Oxygen Device None       Sleep Oxygen Prescription None       Home Exercise Oxygen Prescription None       Home Resting Oxygen Prescription None         Goals/Expected Outcomes   Short Term Goals To learn and understand importance of monitoring SPO2 with pulse oximeter and demonstrate accurate use of the pulse oximeter.;To learn and understand importance of maintaining oxygen saturations>88%;To learn and demonstrate proper pursed lip breathing techniques or other breathing techniques. ;To learn and demonstrate proper use of respiratory medications       Long  Term Goals Maintenance of O2 saturations>88%;Compliance with respiratory medication;Verbalizes importance of monitoring SPO2 with pulse oximeter and return demonstration;Exhibits proper breathing techniques, such as pursed lip breathing or other method taught during program session;Demonstrates proper use of MDIs       Goals/Expected Outcomes Compliance and understanding of oxygen saturation and breathing techniques to decrease shortness of breath           Oxygen Discharge (Final Oxygen Re-Evaluation):  Oxygen Re-Evaluation - 08/31/23 1159       Program Oxygen Prescription   Program Oxygen Prescription None      Home Oxygen   Home Oxygen Device None    Sleep Oxygen Prescription None    Home Exercise Oxygen Prescription None    Home Resting Oxygen Prescription None      Goals/Expected Outcomes   Short Term Goals To learn and understand importance of monitoring SPO2 with pulse oximeter and demonstrate accurate use of the pulse oximeter.;To learn and understand importance of maintaining oxygen saturations>88%;To learn and demonstrate proper pursed lip breathing techniques or other breathing techniques. ;To learn and demonstrate proper use of respiratory medications    Long  Term Goals Maintenance of O2 saturations>88%;Compliance with respiratory medication;Verbalizes importance of monitoring SPO2 with pulse oximeter and return demonstration;Exhibits proper breathing techniques, such as pursed lip breathing or other method taught during program session;Demonstrates proper use of MDIs    Goals/Expected Outcomes Compliance and understanding of oxygen saturation and breathing techniques to decrease shortness of breath          Initial Exercise Prescription:  Initial Exercise Prescription - 02/18/24 1300       Date of Initial Exercise RX and Referring Provider   Date 02/18/24    Referring Provider [name]    Expected Discharge Date 05/23/24  Oxygen   Oxygen Intermittent    Liters 0-2    Maintain Oxygen Saturation 88% or higher      Treadmill   MPH 2    Grade 1    Minutes 15    METs 2.67      Elliptical   Level 1    Speed 1    Minutes 15    METs 2.6      Prescription Details   Frequency (times per week) 2    Duration Progress to 30 minutes of continuous aerobic without signs/symptoms of physical distress      Intensity   THRR 40-80% of Max Heartrate 59-118    Ratings of Perceived Exertion 11-13    Perceived  Dyspnea 0-4      Progression   Progression Continue progressive overload as per policy without signs/symptoms or physical distress.      Resistance Training   Training Prescription Yes    Weight blue bands    Reps 10-15          Perform Capillary Blood Glucose checks as needed.  Exercise Prescription Changes:   Exercise Prescription Changes     Row Name 09/07/23 1500             Response to Exercise   Blood Pressure (Admit) 112/56       Blood Pressure (Exercise) 148/68       Blood Pressure (Exit) 120/60       Heart Rate (Admit) 75 bpm       Heart Rate (Exercise) 112 bpm       Heart Rate (Exit) 85 bpm       Oxygen Saturation (Admit) 92 %       Oxygen Saturation (Exercise) 89 %       Oxygen Saturation (Exit) 93 %       Rating of Perceived Exertion (Exercise) 13       Perceived Dyspnea (Exercise) 2       Duration Continue with 30 min of aerobic exercise without signs/symptoms of physical distress.       Intensity THRR unchanged         Progression   Progression Continue to progress workloads to maintain intensity without signs/symptoms of physical distress.         Resistance Training   Training Prescription Yes       Weight black bands       Reps 10-15       Time 10 Minutes         Treadmill   MPH 2.5       Grade 2       Minutes 15       METs 3.4         Elliptical   Level 1       Speed 1       Minutes 15       METs 3.4          Exercise Comments:   Exercise Comments     Row Name 08/31/23 1519           Exercise Comments Pt completed her first day of group exercise. She exercised on the upright elliptical for 15 min, level 1, incline 1, METs 5.1. She then walked the treadmill for 15 min, 2.5 mph, 2 % incline, METs 3.4. She tolerated well. Performed warm up and cool down with black bands although they were difficult for her. She understands METs.  Exercise Goals and Review:   Exercise Goals     Row Name 08/23/23 1035 02/18/24 1400            Exercise Goals   Increase Physical Activity Yes Yes      Intervention Provide advice, education, support and counseling about physical activity/exercise needs.;Develop an individualized exercise prescription for aerobic and resistive training based on initial evaluation findings, risk stratification, comorbidities and participant's personal goals. Provide advice, education, support and counseling about physical activity/exercise needs.;Develop an individualized exercise prescription for aerobic and resistive training based on initial evaluation findings, risk stratification, comorbidities and participant's personal goals.      Expected Outcomes Short Term: Attend rehab on a regular basis to increase amount of physical activity.;Long Term: Add in home exercise to make exercise part of routine and to increase amount of physical activity.;Long Term: Exercising regularly at least 3-5 days a week. Short Term: Attend rehab on a regular basis to increase amount of physical activity.;Long Term: Exercising regularly at least 3-5 days a week.;Long Term: Add in home exercise to make exercise part of routine and to increase amount of physical activity.      Increase Strength and Stamina Yes Yes      Intervention Provide advice, education, support and counseling about physical activity/exercise needs.;Develop an individualized exercise prescription for aerobic and resistive training based on initial evaluation findings, risk stratification, comorbidities and participant's personal goals. Provide advice, education, support and counseling about physical activity/exercise needs.;Develop an individualized exercise prescription for aerobic and resistive training based on initial evaluation findings, risk stratification, comorbidities and participant's personal goals.      Expected Outcomes Short Term: Increase workloads from initial exercise prescription for resistance, speed, and METs.;Short Term: Perform  resistance training exercises routinely during rehab and add in resistance training at home;Long Term: Improve cardiorespiratory fitness, muscular endurance and strength as measured by increased METs and functional capacity ( ) Short Term: Perform resistance training exercises routinely during rehab and add in resistance training at home;Long Term: Improve cardiorespiratory fitness, muscular endurance and strength as measured by increased METs and functional capacity ( );Short Term: Increase workloads from initial exercise prescription for resistance, speed, and METs.      Able to understand and use rate of perceived exertion (RPE) scale Yes Yes      Intervention Provide education and explanation on how to use RPE scale Provide education and explanation on how to use RPE scale      Expected Outcomes Short Term: Able to use RPE daily in rehab to express subjective intensity level;Long Term:  Able to use RPE to guide intensity level when exercising independently Short Term: Able to use RPE daily in rehab to express subjective intensity level;Long Term:  Able to use RPE to guide intensity level when exercising independently      Able to understand and use Dyspnea scale Yes Yes      Intervention Provide education and explanation on how to use Dyspnea scale Provide education and explanation on how to use Dyspnea scale      Expected Outcomes Short Term: Able to use Dyspnea scale daily in rehab to express subjective sense of shortness of breath during exertion;Long Term: Able to use Dyspnea scale to guide intensity level when exercising independently Short Term: Able to use Dyspnea scale daily in rehab to express subjective sense of shortness of breath during exertion;Long Term: Able to use Dyspnea scale to guide intensity level when exercising independently      Knowledge and understanding of Target  Heart Rate Range (THRR) Yes Yes      Intervention Provide education and explanation of THRR including how the  numbers were predicted and where they are located for reference Provide education and explanation of THRR including how the numbers were predicted and where they are located for reference      Expected Outcomes Short Term: Able to state/look up THRR;Long Term: Able to use THRR to govern intensity when exercising independently;Short Term: Able to use daily as guideline for intensity in rehab Short Term: Able to state/look up THRR;Short Term: Able to use daily as guideline for intensity in rehab;Long Term: Able to use THRR to govern intensity when exercising independently      Understanding of Exercise Prescription Yes Yes      Intervention Provide education, explanation, and written materials on patient's individual exercise prescription Provide education, explanation, and written materials on patient's individual exercise prescription      Expected Outcomes Short Term: Able to explain program exercise prescription;Long Term: Able to explain home exercise prescription to exercise independently Short Term: Able to explain program exercise prescription;Long Term: Able to explain home exercise prescription to exercise independently         Exercise Goals Re-Evaluation :  Exercise Goals Re-Evaluation     Row Name 08/31/23 1159 09/16/23 1540           Exercise Goal Re-Evaluation   Exercise Goals Review Increase Physical Activity;Able to understand and use Dyspnea scale;Understanding of Exercise Prescription;Increase Strength and Stamina;Knowledge and understanding of Target Heart Rate Range (THRR);Able to understand and use rate of perceived exertion (RPE) scale Increase Physical Activity;Able to understand and use Dyspnea scale;Understanding of Exercise Prescription;Increase Strength and Stamina;Knowledge and understanding of Target Heart Rate Range (THRR);Able to understand and use rate of perceived exertion (RPE) scale      Comments Patricia Johnston is scheduled to begin group exercise today. Will monitor for  progression. Pt completed 4 exercise sessions however needed to be discharge early due to the need for shoulder surgery. She exercised on the upright elliptical for 15 min, METs 5.1. She also walked on the treadmill for 15 min, METs 3.4. She will recover and then get another referral.      Expected Outcomes Through exercise at rehab and home, the patient will decrease shortness of breath with daily activities and feel confident in carrying out an exercise regimen at home. Through exercise at rehab and home, the patient will decrease shortness of breath with daily activities and feel confident in carrying out an exercise regimen at home.         Discharge Exercise Prescription (Final Exercise Prescription Changes):  Exercise Prescription Changes - 09/07/23 1500       Response to Exercise   Blood Pressure (Admit) 112/56    Blood Pressure (Exercise) 148/68    Blood Pressure (Exit) 120/60    Heart Rate (Admit) 75 bpm    Heart Rate (Exercise) 112 bpm    Heart Rate (Exit) 85 bpm    Oxygen Saturation (Admit) 92 %    Oxygen Saturation (Exercise) 89 %    Oxygen Saturation (Exit) 93 %    Rating of Perceived Exertion (Exercise) 13    Perceived Dyspnea (Exercise) 2    Duration Continue with 30 min of aerobic exercise without signs/symptoms of physical distress.    Intensity THRR unchanged      Progression   Progression Continue to progress workloads to maintain intensity without signs/symptoms of physical distress.      Resistance  Training   Training Prescription Yes    Weight black bands    Reps 10-15    Time 10 Minutes      Treadmill   MPH 2.5    Grade 2    Minutes 15    METs 3.4      Elliptical   Level 1    Speed 1    Minutes 15    METs 3.4          Nutrition:  Target Goals: Understanding of nutrition guidelines, daily intake of sodium 1500mg , cholesterol 200mg , calories 30% from fat and 7% or less from saturated fats, daily to have 5 or more servings of fruits and  vegetables.  Biometrics:  Pre Biometrics - 02/18/24 1400       Pre Biometrics   Grip Strength 22 kg   new dynanometer          Nutrition Therapy Plan and Nutrition Goals:  Nutrition Therapy & Goals - 09/02/23 1405       Nutrition Therapy   Diet General Healthy Diet      Personal Nutrition Goals   Nutrition Goal Patient to maintain diet quality by using the plate method as a guide for meal planning to include lean protein/plant protein, fruits, vegetables, whole grains, nonfat dairy as part of a well-balanced diet.    Personal Goal #2 Patient to maintain weight throughout duration of pulmonary rehab.    Comments Patient has medical history of COPD, bronchiectasis, acute and chronic respiratory failure, severe persistent astham. Patricia Johnston reports following a regular diet and motivation to maintain her weight. She has a history of using Ensure Enlive supplements 2x/day (350kcals, 930g protein each). She has previously completed pulmonary rehab in 2017, March 2023, February 2025. Patient will benefit from participation in pulmonary rehab for nutrition and exercise support.      Intervention Plan   Intervention Prescribe, educate and counsel regarding individualized specific dietary modifications aiming towards targeted core components such as weight, hypertension, lipid management, diabetes, heart failure and other comorbidities.;Nutrition handout(s) given to patient.    Expected Outcomes Short Term Goal: Understand basic principles of dietary content, such as calories, fat, sodium, cholesterol and nutrients.;Long Term Goal: Adherence to prescribed nutrition plan.          Nutrition Assessments:  MEDIFICTS Score Key: >=70 Need to make dietary changes  40-70 Heart Healthy Diet <= 40 Therapeutic Level Cholesterol Diet  Flowsheet Row PULMONARY REHAB OTHER RESPIRATORY from 09/02/2023 in [facility]  Picture Your Plate Total  Score on Admission 63   Picture Your Plate Scores: <59 Unhealthy dietary pattern with much room for improvement. 41-50 Dietary pattern unlikely to meet recommendations for good health and room for improvement. 51-60 More healthful dietary pattern, with some room for improvement.  >60 Healthy dietary pattern, although there may be some specific behaviors that could be improved.    Nutrition Goals Re-Evaluation:  Nutrition Goals Re-Evaluation     Row Name 09/02/23 1405             Goals   Current Weight 128 lb 4.9 oz (58.2 kg)       Expected Outcome Patient has medical history of COPD, bronchiectasis, acute and chronic respiratory failure, severe persistent astham. Elandra reports following a regular diet and motivation to maintain her weight. She has a history of using Ensure Enlive supplements 2x/day (350kcals, 930g protein each). She has previously completed pulmonary rehab in 2017, March 2023,  February 2025. Patient will benefit from participation in pulmonary rehab for nutrition and exercise support.          Nutrition Goals Discharge (Final Nutrition Goals Re-Evaluation):  Nutrition Goals Re-Evaluation - 09/02/23 1405       Goals   Current Weight 128 lb 4.9 oz (58.2 kg)    Expected Outcome Patient has medical history of COPD, bronchiectasis, acute and chronic respiratory failure, severe persistent astham. Patricia Johnston reports following a regular diet and motivation to maintain her weight. She has a history of using Ensure Enlive supplements 2x/day (350kcals, 930g protein each). She has previously completed pulmonary rehab in 2017, March 2023, February 2025. Patient will benefit from participation in pulmonary rehab for nutrition and exercise support.          Psychosocial: Target Goals: Acknowledge presence or absence of significant depression and/or stress, maximize coping skills, provide positive support system. Participant is able to verbalize types and ability to use  techniques and skills needed for reducing stress and depression.  Initial Review & Psychosocial Screening:  Initial Psych Review & Screening - 02/18/24 1304       Initial Review   Current issues with None Identified      Family Dynamics   Good Support System? Yes    Comments Pt has good support through her family and church.      Barriers   Psychosocial barriers to participate in program There are no identifiable barriers or psychosocial needs.      Screening Interventions   Interventions Encouraged to exercise    Expected Outcomes Short Term goal: Utilizing psychosocial counselor, staff and physician to assist with identification of specific Stressors or current issues interfering with healing process. Setting desired goal for each stressor or current issue identified.;Long Term Goal: Stressors or current issues are controlled or eliminated.;Short Term goal: Identification and review with participant of any Quality of Life or Depression concerns found by scoring the questionnaire.;Long Term goal: The participant improves quality of Life and PHQ9 Scores as seen by post scores and/or verbalization of changes          Quality of Life Scores:  Scores of 19 and below usually indicate a poorer quality of life in these areas.  A difference of  2-3 points is a clinically meaningful difference.  A difference of 2-3 points in the total score of the Quality of Life Index has been associated with significant improvement in overall quality of life, self-image, physical symptoms, and general health in studies assessing change in quality of life.  PHQ-9: Review Flowsheet  More data exists      02/18/2024 08/23/2023 04/08/2023 01/08/2023 11/21/2021  Depression screen PHQ 2/9  Decreased Interest 0 0 0 0 0 0  Down, Depressed, Hopeless 0 0 0 0 0 0  PHQ - 2 Score 0 0 0 0 0 0  Altered sleeping 0 0 0 0 -  Tired, decreased energy 0 0 0 0 -  Change in appetite 0 0 0 0 -  Feeling bad or failure about  yourself  0 0 0 0 -  Trouble concentrating 0 0 0 0 -  Moving slowly or fidgety/restless 0 0 0 0 -  Suicidal thoughts 0 0 0 0 -  PHQ-9 Score 0 0  0  0  -  Difficult doing work/chores Not difficult at all Not difficult at all Not difficult at all Not difficult at all -    Details       Data saved with a  previous flowsheet row definition   Multiple values from one day are sorted in reverse-chronological order        Interpretation of Total Score  Total Score Depression Severity:  1-4 = Minimal depression, 5-9 = Mild depression, 10-14 = Moderate depression, 15-19 = Moderately severe depression, 20-27 = Severe depression   Psychosocial Evaluation and Intervention:  Psychosocial Evaluation - 02/18/24 1402       Psychosocial Evaluation & Interventions   Interventions Encouraged to exercise with the program and follow exercise prescription    Comments Patricia Johnston denies any psychosocial barriers or concerns at this time    Expected Outcomes For Patricia Johnston to participate in PR free of any psychosocial barriers or concerns    Continue Psychosocial Services  No Follow up required          Psychosocial Re-Evaluation:  Psychosocial Re-Evaluation     Row Name 09/06/23 1213 09/17/23 0838           Psychosocial Re-Evaluation   Current issues with None Identified None Identified      Comments Monthly psychosocial re-evaluation is as follows: Patricia Johnston has attended 2 sessions so far. She denies any psychosocial barriers or concerns. We will continue to monitor and will assess her needs. Patricia Johnston discharged from the program on 09/15/23 completing 4 sessions. She had to withdraw from the program due to shoulder surgery. At the time of discharge Patricia Johnston denied any psy/soc barriers or concerns. She stated she has great support from her church.      Expected Outcomes For Patricia Johnston to be free of any psy/soc barriers of concerns For Patricia Johnston to continue to be free of any psy/soc barriers of concerns  post PR      Interventions Encouraged to attend Pulmonary Rehabilitation for the exercise --      Continue Psychosocial Services  No Follow up required No Follow up required         Psychosocial Discharge (Final Psychosocial Re-Evaluation):  Psychosocial Re-Evaluation - 09/17/23 0838       Psychosocial Re-Evaluation   Current issues with None Identified    Comments Sherryn discharged from the program on 09/15/23 completing 4 sessions. She had to withdraw from the program due to shoulder surgery. At the time of discharge Patricia Johnston denied any psy/soc barriers or concerns. She stated she has great support from her church.    Expected Outcomes For Patricia Johnston to continue to be free of any psy/soc barriers of concerns post PR    Continue Psychosocial Services  No Follow up required          Education: Education Goals: Education classes will be provided on a weekly basis, covering required topics. Participant will state understanding/return demonstration of topics presented.  Learning Barriers/Preferences:  Learning Barriers/Preferences - 02/18/24 1402       Learning Barriers/Preferences   Learning Barriers None    Learning Preferences None          Education Topics: Know Your Numbers Group instruction that is supported by a PowerPoint presentation. Instructor discusses importance of knowing and understanding resting, exercise, and post-exercise oxygen saturation, heart rate, and blood pressure. Oxygen saturation, heart rate, blood pressure, rating of perceived exertion, and dyspnea are reviewed along with a normal range for these values.  Flowsheet Row PULMONARY REHAB OTHER RESPIRATORY from 03/04/2023 in [facility] for Heart, Vascular, & Lung Health  Date 03/04/23  Educator EP  Instruction Review Code 1- Verbalizes Understanding    Exercise for the Pulmonary Patient Group instruction that  is supported by a PowerPoint presentation. Instructor discusses  benefits of exercise, core components of exercise, frequency, duration, and intensity of an exercise routine, importance of utilizing pulse oximetry during exercise, safety while exercising, and options of places to exercise outside of rehab.  Flowsheet Row PULMONARY REHAB OTHER RESPIRATORY from 02/25/2023 in [facility] for Heart, Vascular, & Lung Health  Date 02/25/23  Educator EP  Instruction Review Code 1- Verbalizes Understanding    MET Level  Group instruction provided by PowerPoint, verbal discussion, and written material to support subject matter. Instructor reviews what METs are and how to increase METs.  Flowsheet Row PULMONARY REHAB OTHER RESPIRATORY from 01/21/2023 in [facility] for Heart, Vascular, & Lung Health  Date 01/21/23  Educator EP  Instruction Review Code 1- Verbalizes Understanding    Pulmonary Medications Verbally interactive group education provided by instructor with focus on inhaled medications and proper administration. Flowsheet Row PULMONARY REHAB OTHER RESPIRATORY from 02/18/2023 in [place] for Heart, Vascular, & Lung Health  Date 02/18/23  Educator RT  Instruction Review Code 1- Verbalizes Understanding    Anatomy and Physiology of the Respiratory System Group instruction provided by PowerPoint, verbal discussion, and written material to support subject matter. Instructor reviews respiratory cycle and anatomical components of the respiratory system and their functions. Instructor also reviews differences in obstructive and restrictive respiratory diseases with examples of each.  Flowsheet Row PULMONARY REHAB OTHER RESPIRATORY from 02/11/2023 in [facility] for Heart, Vascular, & Lung Health  Date 02/11/23  Educator RT  Instruction Review Code 1- Verbalizes Understanding    Oxygen Safety Group instruction provided by PowerPoint, verbal discussion, and  written material to support subject matter. There is an overview of What is Oxygen and Why do we need it.  Instructor also reviews how to create a safe environment for oxygen use, the importance of using oxygen as prescribed, and the risks of noncompliance. There is a brief discussion on traveling with oxygen and resources the patient may utilize. Flowsheet Row PULMONARY REHAB OTHER RESPIRATORY from 03/11/2023 in [facility] for Heart, Vascular, & Lung Health  Date 03/11/23  Educator RN  Instruction Review Code 1- Verbalizes Understanding    Oxygen Use Group instruction provided by PowerPoint, verbal discussion, and written material to discuss how supplemental oxygen is prescribed and different types of oxygen supply systems. Resources for more information are provided.  Flowsheet Row PULMONARY REHAB OTHER RESPIRATORY from 09/02/2023 in [facility] for Heart, Vascular, & Lung Health  Date 09/02/23  Educator RN  Instruction Review Code 1- Verbalizes Understanding    Breathing Techniques Group instruction that is supported by demonstration and informational handouts. Instructor discusses the benefits of pursed lip and diaphragmatic breathing and detailed demonstration on how to perform both.  Flowsheet Row PULMONARY REHAB OTHER RESPIRATORY from 03/25/2023 in [facility] for Heart, Vascular, & Lung Health  Date 03/25/23  Educator RN  Instruction Review Code 1- Verbalizes Understanding     Risk Factor Reduction Group instruction that is supported by a PowerPoint presentation. Instructor discusses the definition of a risk factor, different risk factors for pulmonary disease, and how the heart and lungs work together. Flowsheet Row PULMONARY REHAB OTHER RESPIRATORY from 01/14/2023 in [facility] for Heart, Vascular, & Lung Health  Date 01/14/23  Educator EP  Instruction Review Code 1- Verbalizes  Understanding    Pulmonary Diseases  Group instruction provided by PowerPoint, verbal discussion, and written material to support subject matter. Instructor gives an overview of the different type of pulmonary diseases. There is also a discussion on risk factors and symptoms as well as ways to manage the diseases. Flowsheet Row PULMONARY REHAB OTHER RESPIRATORY from 02/04/2023 in [facility] for Heart, Vascular, & Lung Health  Date 02/04/23  Educator RT  Instruction Review Code 1- Verbalizes Understanding    Stress and Energy Conservation Group instruction provided by PowerPoint, verbal discussion, and written material to support subject matter. Instructor gives an overview of stress and the impact it can have on the body. Instructor also reviews ways to reduce stress. There is also a discussion on energy conservation and ways to conserve energy throughout the day.   Warning Signs and Symptoms Group instruction provided by PowerPoint, verbal discussion, and written material to support subject matter. Instructor reviews warning signs and symptoms of stroke, heart attack, cold and flu. Instructor also reviews ways to prevent the spread of infection.   Other Education Group or individual verbal, written, or video instructions that support the educational goals of the pulmonary rehab program. Flowsheet Row PULMONARY REHAB CHRONIC OBSTRUCTIVE PULMONARY DISEASE from 05/01/2021 in [facility] for Heart, Vascular, & Lung Health  Date 04/17/21  Educator --  [facility] Plate]     Knowledge Questionnaire Score:  Knowledge Questionnaire Score - 02/18/24 1302       Knowledge Questionnaire Score   Pre Score 17/18          Core Components/Risk Factors/Patient Goals at Admission:  Personal Goals and Risk Factors at Admission - 02/18/24 1402       Core Components/Risk Factors/Patient Goals on Admission    Weight Management Yes;Weight Maintenance     Intervention Weight Management: Develop a combined nutrition and exercise program designed to reach desired caloric intake, while maintaining appropriate intake of nutrient and fiber, sodium and fats, and appropriate energy expenditure required for the weight goal.;Weight Management: Provide education and appropriate resources to help participant work on and attain dietary goals.;Weight Management/Obesity: Establish reasonable short term and long term weight goals.    Expected Outcomes Short Term: Continue to assess and modify interventions until short term weight is achieved;Long Term: Adherence to nutrition and physical activity/exercise program aimed toward attainment of established weight goal;Weight Maintenance: Understanding of the daily nutrition guidelines, which includes 25-35% calories from fat, 7% or less cal from saturated fats, less than 200mg  cholesterol, less than 1.5gm of sodium, & 5 or more servings of fruits and vegetables daily;Understanding recommendations for meals to include 15-35% energy as protein, 25-35% energy from fat, 35-60% energy from carbohydrates, less than 200mg  of dietary cholesterol, 20-35 gm of total fiber daily;Understanding of distribution of calorie intake throughout the day with the consumption of 4-5 meals/snacks    Improve shortness of breath with ADL's Yes    Intervention Provide education, individualized exercise plan and daily activity instruction to help decrease symptoms of SOB with activities of daily living.    Expected Outcomes Short Term: Improve cardiorespiratory fitness to achieve a reduction of symptoms when performing ADLs;Long Term: Be able to perform more ADLs without symptoms or delay the onset of symptoms          Core Components/Risk Factors/Patient Goals Review:   Goals and Risk Factor Review     Row Name 09/06/23 1222 09/17/23 0833           Core Components/Risk Factors/Patient Goals Review  Personal Goals Review Weight  Management/Obesity;Improve shortness of breath with ADL's;Develop more efficient breathing techniques such as purse lipped breathing and diaphragmatic breathing and practicing self-pacing with activity. Weight Management/Obesity;Improve shortness of breath with ADL's      Review Monthly review of patient's Core Components/Risk Factors/Patient Goals are as follows: Goal in progress for improving her shortness of breath with ADLs. Patricia Johnston has been able to maintain her oxygen saturation on room air with exertion. She completed the program last November and has participated in the program yearly for the last 2-3 years. Goal met on developing more efficient breathing techniques such as purse lipped breathing and diaphragmatic breathing; and practicing self-pacing with activity. She can initiate pursed lip breathing and is practicing diaphragmatic breathing before bed. She can self-pace herself when needed and allows herself to take breaks while walking or exercising. Goal of weight maintenance is progressing. Patricia Johnston has been within 1# since beginning the program. Patricia Johnston will continue to benefit from PR for nutrition, education, exercise, and lifestyle modification. Patricia Johnston discharged from the program on 09/15/23 completing 4 sessions. She had to withdraw from the program due to shoulder surgery. Discharge review of patient's Core Components/Risk Factors/Patient Goals are as follows: Goal not met for improving her shortness of breath with ADLs. Nekisha has been able to maintain her oxygen saturation on room air with exertion. She completed the program last November and has participated in the program yearly for the last 2-3 years but has not continued exercise when she graduated the program. Goal met for weight maintenance. Patricia Johnston was in 1.5# since beginning the program.      Expected Outcomes Pt will show progress toward meeting expected goals and outcomes. Pt will continue show progress toward meeting expected  goals and outcomes post PR         Core Components/Risk Factors/Patient Goals at Discharge (Final Review):   Goals and Risk Factor Review - 09/17/23 0833       Core Components/Risk Factors/Patient Goals Review   Personal Goals Review Weight Management/Obesity;Improve shortness of breath with ADL's    Review Patricia Johnston discharged from the program on 09/15/23 completing 4 sessions. She had to withdraw from the program due to shoulder surgery. Discharge review of patient's Core Components/Risk Factors/Patient Goals are as follows: Goal not met for improving her shortness of breath with ADLs. Patricia Johnston has been able to maintain her oxygen saturation on room air with exertion. She completed the program last November and has participated in the program yearly for the last 2-3 years but has not continued exercise when she graduated the program. Goal met for weight maintenance. Patricia Johnston was in 1.5# since beginning the program.    Expected Outcomes Pt will continue show progress toward meeting expected goals and outcomes post PR          ITP Comments:   Comments: Dr. Slater Staff is Medical Director for Pulmonary Rehab at [facility].       [1]  Current Outpatient Medications:    albuterol  (PROVENTIL ) (2.5 MG/3ML) 0.083% nebulizer solution, Take 2.5 mg by nebulization as needed for wheezing or shortness of breath., Disp: , Rfl:    albuterol  (VENTOLIN  HFA) 108 (90 Base) MCG/ACT inhaler, Inhale 2 puffs into the lungs every 4 (four) hours as needed for wheezing or shortness of breath., Disp: 1 Inhaler, Rfl: 1   alendronate (FOSAMAX) 70 MG tablet, Take 70 mg by mouth every Monday. Take with a full glass of water on an empty stomach., Disp: , Rfl:  budesonide  (RHINOCORT  AQUA) 32 MCG/ACT nasal spray, Place into the nose., Disp: , Rfl:    Calcium Carb-Cholecalciferol (CALCIUM 500+D PO), Take 1 tablet by mouth daily., Disp: , Rfl:    celecoxib (CELEBREX) 200 MG capsule, Take 200 mg by mouth  daily., Disp: , Rfl:    diphenhydrAMINE  (BENADRYL ) 25 MG tablet, Take 25 mg by mouth every 6 (six) hours as needed (anxiety)., Disp: , Rfl:    feeding supplement, ENSURE ENLIVE, (ENSURE ENLIVE) LIQD, Take 237 mLs by mouth 2 (two) times daily between meals., Disp: 237 mL, Rfl: 12   guaiFENesin  (MUCINEX ) 600 MG 12 hr tablet, Take 1 tablet (600 mg total) by mouth 2 (two) times daily., Disp: 30 tablet, Rfl: 0   mometasone  (ASMANEX ) 220 MCG/INH inhaler, Inhale 1 puff into the lungs 2 (two) times daily., Disp: 1 each, Rfl: 11   Mometasone  Furoate (ASMANEX  HFA) 200 MCG/ACT AERO, Take 200 mcg by mouth 2 (two) times daily., Disp: , Rfl:    montelukast (SINGULAIR) 10 MG tablet, Take 10 mg by mouth daily., Disp: , Rfl:    Multiple Vitamins-Minerals (MULTIVITAMIN WITH MINERALS) tablet, Take 2 tablets by mouth daily., Disp: , Rfl:    pantoprazole  (PROTONIX ) 40 MG tablet, Take 1 tablet (40 mg total) by mouth daily., Disp: 90 tablet, Rfl: 3   sodium chloride  HYPERTONIC 3 % nebulizer solution, INHALE 1 VIAL BY VIA NEBULIZER TWICE A DAY, Disp: , Rfl:    Tiotropium Bromide -Olodaterol (STIOLTO RESPIMAT ) 2.5-2.5 MCG/ACT AERS, Inhale into the lungs daily., Disp: , Rfl:    Tiotropium Bromide -Olodaterol 2.5-2.5 MCG/ACT AERS, Inhale 2 puffs into the lungs daily., Disp: 1 each, Rfl: 11   predniSONE  (DELTASONE ) 20 MG tablet, 2 tabs po daily x 4 days (Patient not taking: Reported on 02/18/2024), Disp: 8 tablet, Rfl: 0  Current Facility-Administered Medications:    0.9 %  sodium chloride  infusion, 500 mL, Intravenous, Once, Aneita Gwendlyn DASEN, MD [2]  Social History Tobacco Use  Smoking Status Never  Smokeless Tobacco Never

## 2024-02-18 NOTE — Progress Notes (Signed)
 Patricia Johnston 73 y.o. female Pulmonary Rehab Orientation Note This patient who was referred to Pulmonary Rehab by Dr. [name] with the diagnosis of bronchiectasis arrived today in Cardiac and Pulmonary Rehab. She  arrived ambulatory with normal gait. She  does not carry portable oxygen. Per patient, Patricia Johnston uses oxygen never. Color good, skin warm and dry. Patient is oriented to time and place. Patient's medical history, psychosocial health, and medications reviewed. Psychosocial assessment reveals patient lives with alone. Patricia Johnston is currently retired. Patient hobbies include spending time with others. Patient reports her stress level is low. Areas of stress/anxiety include health. Patient does not exhibit signs of depression. Signs of depression include helplessness and hopelessness and fatigue. PHQ2/9 score 0/0. Patricia Johnston shows good  coping skills with positive outlook on life. Offered emotional support and reassurance. Will continue to monitor and evaluate progress toward psychosocial goal(s) of decreased health related stress. Physical assessment reveals heart rate is normal, breath sounds clear to auscultation, no wheezes, rales, or rhonchi. Grip strength equal, strong. Distal pulses present. Patricia Johnston reports she does take medications as prescribed. Patient states she follows a regular  diet. The patient reports no specific efforts to gain or lose weight.. Pt's weight will be monitored closely. Demonstration and practice of PLB using pulse oximeter. Patricia Johnston able to return demonstration satisfactorily. Safety and hand hygiene in the exercise area reviewed with patient. Patricia Johnston voices understanding of the information reviewed. Department expectations discussed with patient and achievable goals were set. The patient shows enthusiasm about attending the program and we look forward to working with Patricia. Patricia Johnston completed a 6 min walk test today and is scheduled to begin exercise on  at 12/30.    [id number] Zed Wanninger

## 2024-02-23 NOTE — Progress Notes (Signed)
 Pulmonary Individual Treatment Plan  Patient Details  Name: Patricia Johnston MRN: [id number] Date of Birth: Oct 01, 1950 Referring Provider:   Conrad Ports Pulmonary Rehab Walk Test from 02/18/2024 in [facility] for Heart, V[facility], [facility]  Referring Provider [name]    Initial Encounter Date:  Flowsheet Row Pulmonary Rehab Walk Test from 02/18/2024 in [facility] for Heart, Vascular, & Lung Health  Date 02/18/24    Visit Diagnosis: Bronchiectasis without complication (HCC)  Patient's Home Medications on Admission:  Current Medications[1]  Past Medical History: Past Medical History:  Diagnosis Date   Anxiety    Asthma    COPD (chronic obstructive pulmonary disease) (HCC)    pt states due to [employer] contracted COPD   Difficult intubation    Pt does not remember details over 30 years ago   Eczema    Esophageal thickening 10/13/2018   on CT- [facility]   GERD (gastroesophageal reflux disease)    Hiatal hernia    Osteoporosis    PONV (postoperative nausea and vomiting)    Pulmonary emphysema (HCC)    Pulmonary nodules    Traumatic arthritis    Umbilical hernia    Wears glasses     Tobacco Use: Tobacco Use History[2]  Labs: Review Flowsheet        No data to display          Capillary Blood Glucose: Lab Results  Component Value Date   GLUCAP 99 06/16/2017     Pulmonary Assessment Scores:  Pulmonary Assessment Scores     Row Name 02/18/24 1307         ADL UCSD   ADL Phase Entry     SOB Score total 61       CAT Score   CAT Score 21       mMRC Score   mMRC Score 3       UCSD: Self-administered rating of dyspnea associated with activities of daily living (ADLs) 6-point scale (0 = not at all to 5 = maximal or unable to do because of breathlessness)  Scoring Scores range from 0 to 120.  Minimally important difference is 5 units  CAT: CAT can identify the  health impairment of COPD patients and is better correlated with disease progression.  CAT has a scoring range of zero to 40. The CAT score is classified into four groups of low (less than 10), medium (10 - 20), high (21-30) and very high (31-40) based on the impact level of disease on health status. A CAT score over 10 suggests significant symptoms.  A worsening CAT score could be explained by an exacerbation, poor medication adherence, poor inhaler technique, or progression of COPD or comorbid conditions.  CAT MCID is 2 points  mMRC: mMRC (Modified Medical Research Council) Dyspnea Scale is used to assess the degree of baseline functional disability in patients of respiratory disease due to dyspnea. No minimal important difference is established. A decrease in score of 1 point or greater is considered a positive change.   Pulmonary Function Assessment:  Pulmonary Function Assessment - 02/18/24 1401       Breath   Bilateral Breath Sounds Clear    Shortness of Breath Yes;Limiting activity          Exercise Target Goals: Exercise Program Goal: Individual exercise prescription set using results from initial 6 min walk test and THRR while considering  patients activity barriers and safety.   Exercise Prescription Goal: Initial  exercise prescription builds to 30-45 minutes a day of aerobic activity, 2-3 days per week.  Home exercise guidelines will be given to patient during program as part of exercise prescription that the participant will acknowledge.  Activity Barriers & Risk Stratification:  Activity Barriers & Cardiac Risk Stratification - 02/18/24 1304       Activity Barriers & Cardiac Risk Stratification   Activity Barriers Deconditioning;Muscular Weakness;Shortness of Breath          6 Minute Walk:  6 Minute Walk     Row Name 02/18/24 1412         6 Minute Walk   Phase Initial     Distance 1680 feet     Walk Time 6 minutes     # of Rest Breaks 0     MPH 3.18      METS 4.47     RPE 12     Perceived Dyspnea  2     VO2 Peak 15.63     Symptoms No     Resting HR 77 bpm     Resting BP 98/60     Resting Oxygen Saturation  95 %     Exercise Oxygen Saturation  during 6 min walk 85 %     Max Ex. HR 131 bpm     Max Ex. BP 180/72     2 Minute Post BP 124/68       Interval HR   1 Minute HR 81     2 Minute HR 116     3 Minute HR 126     4 Minute HR 127     5 Minute HR 129     6 Minute HR 131     2 Minute Post HR 82     Interval Heart Rate? Yes       Interval Oxygen   Interval Oxygen? Yes     Baseline Oxygen Saturation % 95 %     1 Minute Oxygen Saturation % 93 %     1 Minute Liters of Oxygen 0 L     2 Minute Oxygen Saturation % 90 %     3 Minute Oxygen Saturation % 89 %  3:41-4:00 O2 85%, 2L [place] applied     3 Minute Liters of Oxygen 123 L     4 Minute Oxygen Saturation % 90 %     4 Minute Liters of Oxygen 127 L     5 Minute Oxygen Saturation % 94 %     5 Minute Liters of Oxygen 129 L     6 Minute Oxygen Saturation % 94 %     6 Minute Liters of Oxygen 131 L     2 Minute Post Oxygen Saturation % 96 %     2 Minute Post Liters of Oxygen 82 L        Oxygen Initial Assessment:  Oxygen Initial Assessment - 02/18/24 1401       Home Oxygen   Home Oxygen Device None    Sleep Oxygen Prescription None    Home Exercise Oxygen Prescription None    Home Resting Oxygen Prescription None      Initial 6 min Walk   Oxygen Used Continuous    Liters per minute 2      Program Oxygen Prescription   Program Oxygen Prescription Continuous    Liters per minute 2      Intervention   Short Term Goals To learn and exhibit compliance with exercise,  home and travel O2 prescription;To learn and understand importance of maintaining oxygen saturations>88%;To learn and demonstrate proper use of respiratory medications;To learn and demonstrate proper pursed lip breathing techniques or other breathing techniques. ;To learn and understand importance of monitoring  SPO2 with pulse oximeter and demonstrate accurate use of the pulse oximeter.    Long  Term Goals Exhibits compliance with exercise, home  and travel O2 prescription;Maintenance of O2 saturations>88%;Compliance with respiratory medication;Demonstrates proper use of MDIs;Exhibits proper breathing techniques, such as pursed lip breathing or other method taught during program session;Verbalizes importance of monitoring SPO2 with pulse oximeter and return demonstration          Oxygen Re-Evaluation:  Oxygen Re-Evaluation     Row Name 08/31/23 1159 02/18/24 1502           Program Oxygen Prescription   Program Oxygen Prescription None Continuous      Liters per minute -- 2        Home Oxygen   Home Oxygen Device None None      Sleep Oxygen Prescription None None      Home Exercise Oxygen Prescription None None      Home Resting Oxygen Prescription None None        Goals/Expected Outcomes   Short Term Goals To learn and understand importance of monitoring SPO2 with pulse oximeter and demonstrate accurate use of the pulse oximeter.;To learn and understand importance of maintaining oxygen saturations>88%;To learn and demonstrate proper pursed lip breathing techniques or other breathing techniques. ;To learn and demonstrate proper use of respiratory medications To learn and exhibit compliance with exercise, home and travel O2 prescription;To learn and understand importance of maintaining oxygen saturations>88%;To learn and demonstrate proper use of respiratory medications;To learn and demonstrate proper pursed lip breathing techniques or other breathing techniques. ;To learn and understand importance of monitoring SPO2 with pulse oximeter and demonstrate accurate use of the pulse oximeter.      Long  Term Goals Maintenance of O2 saturations>88%;Compliance with respiratory medication;Verbalizes importance of monitoring SPO2 with pulse oximeter and return demonstration;Exhibits proper breathing  techniques, such as pursed lip breathing or other method taught during program session;Demonstrates proper use of MDIs Exhibits compliance with exercise, home  and travel O2 prescription;Maintenance of O2 saturations>88%;Compliance with respiratory medication;Demonstrates proper use of MDIs;Exhibits proper breathing techniques, such as pursed lip breathing or other method taught during program session;Verbalizes importance of monitoring SPO2 with pulse oximeter and return demonstration      Goals/Expected Outcomes Compliance and understanding of oxygen saturation and breathing techniques to decrease shortness of breath Compliance and understanding of oxygen saturation and breathing techniques to decrease shortness of breath         Oxygen Discharge (Final Oxygen Re-Evaluation):  Oxygen Re-Evaluation - 02/18/24 1502       Program Oxygen Prescription   Program Oxygen Prescription Continuous    Liters per minute 2      Home Oxygen   Home Oxygen Device None    Sleep Oxygen Prescription None    Home Exercise Oxygen Prescription None    Home Resting Oxygen Prescription None      Goals/Expected Outcomes   Short Term Goals To learn and exhibit compliance with exercise, home and travel O2 prescription;To learn and understand importance of maintaining oxygen saturations>88%;To learn and demonstrate proper use of respiratory medications;To learn and demonstrate proper pursed lip breathing techniques or other breathing techniques. ;To learn and understand importance of monitoring SPO2 with pulse oximeter and demonstrate accurate use of the  pulse oximeter.    Long  Term Goals Exhibits compliance with exercise, home  and travel O2 prescription;Maintenance of O2 saturations>88%;Compliance with respiratory medication;Demonstrates proper use of MDIs;Exhibits proper breathing techniques, such as pursed lip breathing or other method taught during program session;Verbalizes importance of monitoring SPO2 with  pulse oximeter and return demonstration    Goals/Expected Outcomes Compliance and understanding of oxygen saturation and breathing techniques to decrease shortness of breath          Initial Exercise Prescription:  Initial Exercise Prescription - 02/18/24 1300       Date of Initial Exercise RX and Referring Provider   Date 02/18/24    Referring Provider [name]    Expected Discharge Date 05/23/24      Oxygen   Oxygen Intermittent    Liters 0-2    Maintain Oxygen Saturation 88% or higher      Treadmill   MPH 2    Grade 1    Minutes 15    METs 2.67      Elliptical   Level 1    Speed 1    Minutes 15    METs 2.6      Prescription Details   Frequency (times per week) 2    Duration Progress to 30 minutes of continuous aerobic without signs/symptoms of physical distress      Intensity   THRR 40-80% of Max Heartrate 59-118    Ratings of Perceived Exertion 11-13    Perceived Dyspnea 0-4      Progression   Progression Continue progressive overload as per policy without signs/symptoms or physical distress.      Resistance Training   Training Prescription Yes    Weight blue bands    Reps 10-15          Perform Capillary Blood Glucose checks as needed.  Exercise Prescription Changes:   Exercise Prescription Changes     Row Name 09/07/23 1500             Response to Exercise   Blood Pressure (Admit) 112/56       Blood Pressure (Exercise) 148/68       Blood Pressure (Exit) 120/60       Heart Rate (Admit) 75 bpm       Heart Rate (Exercise) 112 bpm       Heart Rate (Exit) 85 bpm       Oxygen Saturation (Admit) 92 %       Oxygen Saturation (Exercise) 89 %       Oxygen Saturation (Exit) 93 %       Rating of Perceived Exertion (Exercise) 13       Perceived Dyspnea (Exercise) 2       Duration Continue with 30 min of aerobic exercise without signs/symptoms of physical distress.       Intensity THRR unchanged         Progression   Progression Continue to  progress workloads to maintain intensity without signs/symptoms of physical distress.         Resistance Training   Training Prescription Yes       Weight black bands       Reps 10-15       Time 10 Minutes         Treadmill   MPH 2.5       Grade 2       Minutes 15       METs 3.4  Elliptical   Level 1       Speed 1       Minutes 15       METs 3.4          Exercise Comments:   Exercise Comments     Row Name 08/31/23 1519           Exercise Comments Pt completed her first day of group exercise. She exercised on the upright elliptical for 15 min, level 1, incline 1, METs 5.1. She then walked the treadmill for 15 min, 2.5 mph, 2 % incline, METs 3.4. She tolerated well. Performed warm up and cool down with black bands although they were difficult for her. She understands METs.          Exercise Goals and Review:   Exercise Goals     Row Name 02/18/24 1400             Exercise Goals   Increase Physical Activity Yes       Intervention Provide advice, education, support and counseling about physical activity/exercise needs.;Develop an individualized exercise prescription for aerobic and resistive training based on initial evaluation findings, risk stratification, comorbidities and participant's personal goals.       Expected Outcomes Short Term: Attend rehab on a regular basis to increase amount of physical activity.;Long Term: Exercising regularly at least 3-5 days a week.;Long Term: Add in home exercise to make exercise part of routine and to increase amount of physical activity.       Increase Strength and Stamina Yes       Intervention Provide advice, education, support and counseling about physical activity/exercise needs.;Develop an individualized exercise prescription for aerobic and resistive training based on initial evaluation findings, risk stratification, comorbidities and participant's personal goals.       Expected Outcomes Short Term: Perform resistance  training exercises routinely during rehab and add in resistance training at home;Long Term: Improve cardiorespiratory fitness, muscular endurance and strength as measured by increased METs and functional capacity ( );Short Term: Increase workloads from initial exercise prescription for resistance, speed, and METs.       Able to understand and use rate of perceived exertion (RPE) scale Yes       Intervention Provide education and explanation on how to use RPE scale       Expected Outcomes Short Term: Able to use RPE daily in rehab to express subjective intensity level;Long Term:  Able to use RPE to guide intensity level when exercising independently       Able to understand and use Dyspnea scale Yes       Intervention Provide education and explanation on how to use Dyspnea scale       Expected Outcomes Short Term: Able to use Dyspnea scale daily in rehab to express subjective sense of shortness of breath during exertion;Long Term: Able to use Dyspnea scale to guide intensity level when exercising independently       Knowledge and understanding of Target Heart Rate Range (THRR) Yes       Intervention Provide education and explanation of THRR including how the numbers were predicted and where they are located for reference       Expected Outcomes Short Term: Able to state/look up THRR;Short Term: Able to use daily as guideline for intensity in rehab;Long Term: Able to use THRR to govern intensity when exercising independently       Understanding of Exercise Prescription Yes       Intervention Provide education, explanation,  and written materials on patient's individual exercise prescription       Expected Outcomes Short Term: Able to explain program exercise prescription;Long Term: Able to explain home exercise prescription to exercise independently          Exercise Goals Re-Evaluation :  Exercise Goals Re-Evaluation     Row Name 08/31/23 1159 09/16/23 1540 02/18/24 1502         Exercise Goal  Re-Evaluation   Exercise Goals Review Increase Physical Activity;Able to understand and use Dyspnea scale;Understanding of Exercise Prescription;Increase Strength and Stamina;Knowledge and understanding of Target Heart Rate Range (THRR);Able to understand and use rate of perceived exertion (RPE) scale Increase Physical Activity;Able to understand and use Dyspnea scale;Understanding of Exercise Prescription;Increase Strength and Stamina;Knowledge and understanding of Target Heart Rate Range (THRR);Able to understand and use rate of perceived exertion (RPE) scale Increase Physical Activity;Able to understand and use Dyspnea scale;Understanding of Exercise Prescription;Increase Strength and Stamina;Knowledge and understanding of Target Heart Rate Range (THRR);Able to understand and use rate of perceived exertion (RPE) scale     Comments Yameli is scheduled to begin group exercise today. Will monitor for progression. Pt completed 4 exercise sessions however needed to be discharge early due to the need for shoulder surgery. She exercised on the upright elliptical for 15 min, METs 5.1. She also walked on the treadmill for 15 min, METs 3.4. She will recover and then get another referral. Pt to begin exercise 12/30. Will progress as tolerated.     Expected Outcomes Through exercise at rehab and home, the patient will decrease shortness of breath with daily activities and feel confident in carrying out an exercise regimen at home. Through exercise at rehab and home, the patient will decrease shortness of breath with daily activities and feel confident in carrying out an exercise regimen at home. Through exercise at rehab and home, the patient will decrease shortness of breath with daily activities and feel confident in carrying out an exercise regimen at home.        Discharge Exercise Prescription (Final Exercise Prescription Changes):  Exercise Prescription Changes - 09/07/23 1500       Response to Exercise    Blood Pressure (Admit) 112/56    Blood Pressure (Exercise) 148/68    Blood Pressure (Exit) 120/60    Heart Rate (Admit) 75 bpm    Heart Rate (Exercise) 112 bpm    Heart Rate (Exit) 85 bpm    Oxygen Saturation (Admit) 92 %    Oxygen Saturation (Exercise) 89 %    Oxygen Saturation (Exit) 93 %    Rating of Perceived Exertion (Exercise) 13    Perceived Dyspnea (Exercise) 2    Duration Continue with 30 min of aerobic exercise without signs/symptoms of physical distress.    Intensity THRR unchanged      Progression   Progression Continue to progress workloads to maintain intensity without signs/symptoms of physical distress.      Resistance Training   Training Prescription Yes    Weight black bands    Reps 10-15    Time 10 Minutes      Treadmill   MPH 2.5    Grade 2    Minutes 15    METs 3.4      Elliptical   Level 1    Speed 1    Minutes 15    METs 3.4          Nutrition:  Target Goals: Understanding of nutrition guidelines, daily intake of sodium 1500mg , cholesterol <  200mg , calories 30% from fat and 7% or less from saturated fats, daily to have 5 or more servings of fruits and vegetables.  Biometrics:  Pre Biometrics - 02/18/24 1400       Pre Biometrics   Grip Strength 22 kg   new dynanometer          Nutrition Therapy Plan and Nutrition Goals:  Nutrition Therapy & Goals - 09/02/23 1405       Nutrition Therapy   Diet General Healthy Diet      Personal Nutrition Goals   Nutrition Goal Patient to maintain diet quality by using the plate method as a guide for meal planning to include lean protein/plant protein, fruits, vegetables, whole grains, nonfat dairy as part of a well-balanced diet.    Personal Goal #2 Patient to maintain weight throughout duration of pulmonary rehab.    Comments Patient has medical history of COPD, bronchiectasis, acute and chronic respiratory failure, severe persistent astham. Adlean reports following a regular diet and motivation  to maintain her weight. She has a history of using Ensure Enlive supplements 2x/day (350kcals, 930g protein each). She has previously completed pulmonary rehab in 2017, March 2023, February 2025. Patient will benefit from participation in pulmonary rehab for nutrition and exercise support.      Intervention Plan   Intervention Prescribe, educate and counsel regarding individualized specific dietary modifications aiming towards targeted core components such as weight, hypertension, lipid management, diabetes, heart failure and other comorbidities.;Nutrition handout(s) given to patient.    Expected Outcomes Short Term Goal: Understand basic principles of dietary content, such as calories, fat, sodium, cholesterol and nutrients.;Long Term Goal: Adherence to prescribed nutrition plan.          Nutrition Assessments:  MEDIFICTS Score Key: >=70 Need to make dietary changes  40-70 Heart Healthy Diet <= 40 Therapeutic Level Cholesterol Diet  Flowsheet Row PULMONARY REHAB OTHER RESPIRATORY from 09/02/2023 in [facility]  Picture Your Plate Total Score on Admission 63   Picture Your Plate Scores: <59 Unhealthy dietary pattern with much room for improvement. 41-50 Dietary pattern unlikely to meet recommendations for good health and room for improvement. 51-60 More healthful dietary pattern, with some room for improvement.  >60 Healthy dietary pattern, although there may be some specific behaviors that could be improved.    Nutrition Goals Re-Evaluation:  Nutrition Goals Re-Evaluation     Row Name 09/02/23 1405             Goals   Current Weight 128 lb 4.9 oz (58.2 kg)       Expected Outcome Patient has medical history of COPD, bronchiectasis, acute and chronic respiratory failure, severe persistent astham. Sandra reports following a regular diet and motivation to maintain her weight. She has a history of using Ensure Enlive  supplements 2x/day (350kcals, 930g protein each). She has previously completed pulmonary rehab in 2017, March 2023, February 2025. Patient will benefit from participation in pulmonary rehab for nutrition and exercise support.          Nutrition Goals Discharge (Final Nutrition Goals Re-Evaluation):  Nutrition Goals Re-Evaluation - 09/02/23 1405       Goals   Current Weight 128 lb 4.9 oz (58.2 kg)    Expected Outcome Patient has medical history of COPD, bronchiectasis, acute and chronic respiratory failure, severe persistent astham. Rodina reports following a regular diet and motivation to maintain her weight. She has a history of using Ensure Enlive supplements  2x/day (350kcals, 930g protein each). She has previously completed pulmonary rehab in 2017, March 2023, February 2025. Patient will benefit from participation in pulmonary rehab for nutrition and exercise support.          Psychosocial: Target Goals: Acknowledge presence or absence of significant depression and/or stress, maximize coping skills, provide positive support system. Participant is able to verbalize types and ability to use techniques and skills needed for reducing stress and depression.  Initial Review & Psychosocial Screening:  Initial Psych Review & Screening - 02/18/24 1304       Initial Review   Current issues with None Identified      Family Dynamics   Good Support System? Yes    Comments Pt has good support through her family and church.      Barriers   Psychosocial barriers to participate in program There are no identifiable barriers or psychosocial needs.      Screening Interventions   Interventions Encouraged to exercise    Expected Outcomes Short Term goal: Utilizing psychosocial counselor, staff and physician to assist with identification of specific Stressors or current issues interfering with healing process. Setting desired goal for each stressor or current issue identified.;Long Term Goal:  Stressors or current issues are controlled or eliminated.;Short Term goal: Identification and review with participant of any Quality of Life or Depression concerns found by scoring the questionnaire.;Long Term goal: The participant improves quality of Life and PHQ9 Scores as seen by post scores and/or verbalization of changes          Quality of Life Scores:  Scores of 19 and below usually indicate a poorer quality of life in these areas.  A difference of  2-3 points is a clinically meaningful difference.  A difference of 2-3 points in the total score of the Quality of Life Index has been associated with significant improvement in overall quality of life, self-image, physical symptoms, and general health in studies assessing change in quality of life.  PHQ-9: Review Flowsheet  More data exists      02/18/2024 08/23/2023 04/08/2023 01/08/2023 11/21/2021  Depression screen PHQ 2/9  Decreased Interest 0 0 0 0 0 0  Down, Depressed, Hopeless 0 0 0 0 0 0  PHQ - 2 Score 0 0 0 0 0 0  Altered sleeping 0 0 0 0 -  Tired, decreased energy 0 0 0 0 -  Change in appetite 0 0 0 0 -  Feeling bad or failure about yourself  0 0 0 0 -  Trouble concentrating 0 0 0 0 -  Moving slowly or fidgety/restless 0 0 0 0 -  Suicidal thoughts 0 0 0 0 -  PHQ-9 Score 0 0  0  0  -  Difficult doing work/chores Not difficult at all Not difficult at all Not difficult at all Not difficult at all -    Details       Data saved with a previous flowsheet row definition   Multiple values from one day are sorted in reverse-chronological order        Interpretation of Total Score  Total Score Depression Severity:  1-4 = Minimal depression, 5-9 = Mild depression, 10-14 = Moderate depression, 15-19 = Moderately severe depression, 20-27 = Severe depression   Psychosocial Evaluation and Intervention:  Psychosocial Evaluation - 02/18/24 1402       Psychosocial Evaluation & Interventions   Interventions Encouraged to exercise  with the program and follow exercise prescription    Comments Marquia denies any  psychosocial barriers or concerns at this time    Expected Outcomes For Nancyann to participate in PR free of any psychosocial barriers or concerns    Continue Psychosocial Services  No Follow up required          Psychosocial Re-Evaluation:  Psychosocial Re-Evaluation     Row Name 09/06/23 1213 09/17/23 0838 02/22/24 1616         Psychosocial Re-Evaluation   Current issues with None Identified None Identified None Identified     Comments Monthly psychosocial re-evaluation is as follows: Kamilya has attended 2 sessions so far. She denies any psychosocial barriers or concerns. We will continue to monitor and will assess her needs. Ellyse discharged from the program on 09/15/23 completing 4 sessions. She had to withdraw from the program due to shoulder surgery. At the time of discharge Analisa denied any psy/soc barriers or concerns. She stated she has great support from her church. 30 day psy/soc eval is as follows: No changes since orientation. Sher is scheduled to start the program next week.     Expected Outcomes For Devaeh to be free of any psy/soc barriers of concerns For Allene to continue to be free of any psy/soc barriers of concerns post PR For Doloros to continue to be free of any psy/soc barriers of concerns post PR     Interventions Encouraged to attend Pulmonary Rehabilitation for the exercise -- Encouraged to attend Pulmonary Rehabilitation for the exercise     Continue Psychosocial Services  No Follow up required No Follow up required Follow up required by staff        Psychosocial Discharge (Final Psychosocial Re-Evaluation):  Psychosocial Re-Evaluation - 02/22/24 1616       Psychosocial Re-Evaluation   Current issues with None Identified    Comments 30 day psy/soc eval is as follows: No changes since orientation. Nakiah is scheduled to start the program next week.     Expected Outcomes For Shanquita to continue to be free of any psy/soc barriers of concerns post PR    Interventions Encouraged to attend Pulmonary Rehabilitation for the exercise    Continue Psychosocial Services  Follow up required by staff          Education: Education Goals: Education classes will be provided on a weekly basis, covering required topics. Participant will state understanding/return demonstration of topics presented.  Learning Barriers/Preferences:  Learning Barriers/Preferences - 02/18/24 1402       Learning Barriers/Preferences   Learning Barriers None    Learning Preferences None          Education Topics: Know Your Numbers Group instruction that is supported by a PowerPoint presentation. Instructor discusses importance of knowing and understanding resting, exercise, and post-exercise oxygen saturation, heart rate, and blood pressure. Oxygen saturation, heart rate, blood pressure, rating of perceived exertion, and dyspnea are reviewed along with a normal range for these values.  Flowsheet Row PULMONARY REHAB OTHER RESPIRATORY from 03/04/2023 in [facility] for Heart, Vascular, & Lung Health  Date 03/04/23  Educator EP  Instruction Review Code 1- Verbalizes Understanding    Exercise for the Pulmonary Patient Group instruction that is supported by a PowerPoint presentation. Instructor discusses benefits of exercise, core components of exercise, frequency, duration, and intensity of an exercise routine, importance of utilizing pulse oximetry during exercise, safety while exercising, and options of places to exercise outside of rehab.  Flowsheet Row PULMONARY REHAB OTHER RESPIRATORY from 02/25/2023 in [facility] for Heart, Vascular, &  Lung Health  Date 02/25/23  Educator EP  Instruction Review Code 1- Verbalizes Understanding    MET Level  Group instruction provided by PowerPoint, verbal discussion, and written  material to support subject matter. Instructor reviews what METs are and how to increase METs.  Flowsheet Row PULMONARY REHAB OTHER RESPIRATORY from 01/21/2023 in [facility] for Heart, Vascular, & Lung Health  Date 01/21/23  Educator EP  Instruction Review Code 1- Verbalizes Understanding    Pulmonary Medications Verbally interactive group education provided by instructor with focus on inhaled medications and proper administration. Flowsheet Row PULMONARY REHAB OTHER RESPIRATORY from 02/18/2023 in [facility] for Heart, Vascular, & Lung Health  Date 02/18/23  Educator RT  Instruction Review Code 1- Verbalizes Understanding    Anatomy and Physiology of the Respiratory System Group instruction provided by PowerPoint, verbal discussion, and written material to support subject matter. Instructor reviews respiratory cycle and anatomical components of the respiratory system and their functions. Instructor also reviews differences in obstructive and restrictive respiratory diseases with examples of each.  Flowsheet Row PULMONARY REHAB OTHER RESPIRATORY from 02/11/2023 in [facility] for Heart, Vascular, & Lung Health  Date 02/11/23  Educator RT  Instruction Review Code 1- Verbalizes Understanding    Oxygen Safety Group instruction provided by PowerPoint, verbal discussion, and written material to support subject matter. There is an overview of What is Oxygen and Why do we need it.  Instructor also reviews how to create a safe environment for oxygen use, the importance of using oxygen as prescribed, and the risks of noncompliance. There is a brief discussion on traveling with oxygen and resources the patient may utilize. Flowsheet Row PULMONARY REHAB OTHER RESPIRATORY from 03/11/2023 in [facility] for Heart, Vascular, & Lung Health  Date 03/11/23  Educator RN  Instruction Review Code 1- Verbalizes  Understanding    Oxygen Use Group instruction provided by PowerPoint, verbal discussion, and written material to discuss how supplemental oxygen is prescribed and different types of oxygen supply systems. Resources for more information are provided.  Flowsheet Row PULMONARY REHAB OTHER RESPIRATORY from 09/02/2023 in [facility] for Heart, Vascular, & Lung Health  Date 09/02/23  Educator RN  Instruction Review Code 1- Verbalizes Understanding    Breathing Techniques Group instruction that is supported by demonstration and informational handouts. Instructor discusses the benefits of pursed lip and diaphragmatic breathing and detailed demonstration on how to perform both.  Flowsheet Row PULMONARY REHAB OTHER RESPIRATORY from 03/25/2023 in [facility] for Heart, Vascular, & Lung Health  Date 03/25/23  Educator RN  Instruction Review Code 1- Verbalizes Understanding     Risk Factor Reduction Group instruction that is supported by a PowerPoint presentation. Instructor discusses the definition of a risk factor, different risk factors for pulmonary disease, and how the heart and lungs work together. Flowsheet Row PULMONARY REHAB OTHER RESPIRATORY from 01/14/2023 in [facility] for Heart, Vascular, & Lung Health  Date 01/14/23  Educator EP  Instruction Review Code 1- Verbalizes Understanding    Pulmonary Diseases Group instruction provided by PowerPoint, verbal discussion, and written material to support subject matter. Instructor gives an overview of the different type of pulmonary diseases. There is also a discussion on risk factors and symptoms as well as ways to manage the diseases. Flowsheet Row PULMONARY REHAB OTHER RESPIRATORY from 02/04/2023 in [facility] for Heart, Vascular, & Lung  Health  Date 02/04/23  Educator RT  Instruction Review Code 1- Verbalizes Understanding    Stress and Energy  Conservation Group instruction provided by PowerPoint, verbal discussion, and written material to support subject matter. Instructor gives an overview of stress and the impact it can have on the body. Instructor also reviews ways to reduce stress. There is also a discussion on energy conservation and ways to conserve energy throughout the day.   Warning Signs and Symptoms Group instruction provided by PowerPoint, verbal discussion, and written material to support subject matter. Instructor reviews warning signs and symptoms of stroke, heart attack, cold and flu. Instructor also reviews ways to prevent the spread of infection.   Other Education Group or individual verbal, written, or video instructions that support the educational goals of the pulmonary rehab program. Flowsheet Row PULMONARY REHAB CHRONIC OBSTRUCTIVE PULMONARY DISEASE from 05/01/2021 in [facility] for Heart, Vascular, & Lung Health  Date 04/17/21  Educator --  [facility] Plate]     Knowledge Questionnaire Score:  Knowledge Questionnaire Score - 02/18/24 1302       Knowledge Questionnaire Score   Pre Score 17/18          Core Components/Risk Factors/Patient Goals at Admission:  Personal Goals and Risk Factors at Admission - 02/18/24 1402       Core Components/Risk Factors/Patient Goals on Admission    Weight Management Yes;Weight Maintenance    Intervention Weight Management: Develop a combined nutrition and exercise program designed to reach desired caloric intake, while maintaining appropriate intake of nutrient and fiber, sodium and fats, and appropriate energy expenditure required for the weight goal.;Weight Management: Provide education and appropriate resources to help participant work on and attain dietary goals.;Weight Management/Obesity: Establish reasonable short term and long term weight goals.    Expected Outcomes Short Term: Continue to assess and modify interventions until short term  weight is achieved;Long Term: Adherence to nutrition and physical activity/exercise program aimed toward attainment of established weight goal;Weight Maintenance: Understanding of the daily nutrition guidelines, which includes 25-35% calories from fat, 7% or less cal from saturated fats, less than 200mg  cholesterol, less than 1.5gm of sodium, & 5 or more servings of fruits and vegetables daily;Understanding recommendations for meals to include 15-35% energy as protein, 25-35% energy from fat, 35-60% energy from carbohydrates, less than 200mg  of dietary cholesterol, 20-35 gm of total fiber daily;Understanding of distribution of calorie intake throughout the day with the consumption of 4-5 meals/snacks    Improve shortness of breath with ADL's Yes    Intervention Provide education, individualized exercise plan and daily activity instruction to help decrease symptoms of SOB with activities of daily living.    Expected Outcomes Short Term: Improve cardiorespiratory fitness to achieve a reduction of symptoms when performing ADLs;Long Term: Be able to perform more ADLs without symptoms or delay the onset of symptoms          Core Components/Risk Factors/Patient Goals Review:   Goals and Risk Factor Review     Row Name 09/06/23 1222 09/17/23 0833 02/22/24 1619         Core Components/Risk Factors/Patient Goals Review   Personal Goals Review Weight Management/Obesity;Improve shortness of breath with ADL's;Develop more efficient breathing techniques such as purse lipped breathing and diaphragmatic breathing and practicing self-pacing with activity. Weight Management/Obesity;Improve shortness of breath with ADL's Weight Management/Obesity;Improve shortness of breath with ADL's;Develop more efficient breathing techniques such as purse lipped breathing and diaphragmatic breathing and practicing self-pacing with activity.  Review Monthly review of patient's Core Components/Risk Factors/Patient Goals are as  follows: Goal in progress for improving her shortness of breath with ADLs. Berania has been able to maintain her oxygen saturation on room air with exertion. She completed the program last November and has participated in the program yearly for the last 2-3 years. Goal met on developing more efficient breathing techniques such as purse lipped breathing and diaphragmatic breathing; and practicing self-pacing with activity. She can initiate pursed lip breathing and is practicing diaphragmatic breathing before bed. She can self-pace herself when needed and allows herself to take breaks while walking or exercising. Goal of weight maintenance is progressing. Roby has been within 1# since beginning the program. Teosha will continue to benefit from PR for nutrition, education, exercise, and lifestyle modification. Jenasis discharged from the program on 09/15/23 completing 4 sessions. She had to withdraw from the program due to shoulder surgery. Discharge review of patient's Core Components/Risk Factors/Patient Goals are as follows: Goal not met for improving her shortness of breath with ADLs. Ciera has been able to maintain her oxygen saturation on room air with exertion. She completed the program last November and has participated in the program yearly for the last 2-3 years but has not continued exercise when she graduated the program. Goal met for weight maintenance. Jashiya was in 1.5# since beginning the program. 30 day Core Components/Risk Factors/Patient Goals Review is as follows: Unable to assess goals yet. Maitlyn is scheduled to start the program next week.     Expected Outcomes Pt will show progress toward meeting expected goals and outcomes. Pt will continue show progress toward meeting expected goals and outcomes post PR Pt will continue to show progress toward meeting expected goals and outcomes.        Core Components/Risk Factors/Patient Goals at Discharge (Final Review):   Goals and Risk  Factor Review - 02/22/24 1619       Core Components/Risk Factors/Patient Goals Review   Personal Goals Review Weight Management/Obesity;Improve shortness of breath with ADL's;Develop more efficient breathing techniques such as purse lipped breathing and diaphragmatic breathing and practicing self-pacing with activity.    Review 30 day Core Components/Risk Factors/Patient Goals Review is as follows: Unable to assess goals yet. Xolani is scheduled to start the program next week.    Expected Outcomes Pt will continue to show progress toward meeting expected goals and outcomes.          ITP Comments:   Comments: Pt is making expected progress toward Pulmonary Rehab goals after completing 0 session(s). Recommend continued exercise, life style modification, education, and utilization of breathing techniques to increase stamina and strength, while also decreasing shortness of breath with exertion.  Dr. Slater Staff is Medical Director for Pulmonary Rehab at [facility].       [1]  Current Outpatient Medications:    albuterol  (PROVENTIL ) (2.5 MG/3ML) 0.083% nebulizer solution, Take 2.5 mg by nebulization as needed for wheezing or shortness of breath., Disp: , Rfl:    albuterol  (VENTOLIN  HFA) 108 (90 Base) MCG/ACT inhaler, Inhale 2 puffs into the lungs every 4 (four) hours as needed for wheezing or shortness of breath., Disp: 1 Inhaler, Rfl: 1   alendronate (FOSAMAX) 70 MG tablet, Take 70 mg by mouth every Monday. Take with a full glass of water on an empty stomach., Disp: , Rfl:    budesonide  (RHINOCORT  AQUA) 32 MCG/ACT nasal spray, Place into the nose., Disp: , Rfl:    Calcium Carb-Cholecalciferol (CALCIUM 500+D PO), Take  1 tablet by mouth daily., Disp: , Rfl:    celecoxib (CELEBREX) 200 MG capsule, Take 200 mg by mouth daily., Disp: , Rfl:    diphenhydrAMINE  (BENADRYL ) 25 MG tablet, Take 25 mg by mouth every 6 (six) hours as needed (anxiety)., Disp: , Rfl:    feeding supplement,  ENSURE ENLIVE, (ENSURE ENLIVE) LIQD, Take 237 mLs by mouth 2 (two) times daily between meals., Disp: 237 mL, Rfl: 12   guaiFENesin  (MUCINEX ) 600 MG 12 hr tablet, Take 1 tablet (600 mg total) by mouth 2 (two) times daily., Disp: 30 tablet, Rfl: 0   mometasone  (ASMANEX ) 220 MCG/INH inhaler, Inhale 1 puff into the lungs 2 (two) times daily., Disp: 1 each, Rfl: 11   Mometasone  Furoate (ASMANEX  HFA) 200 MCG/ACT AERO, Take 200 mcg by mouth 2 (two) times daily., Disp: , Rfl:    montelukast (SINGULAIR) 10 MG tablet, Take 10 mg by mouth daily., Disp: , Rfl:    Multiple Vitamins-Minerals (MULTIVITAMIN WITH MINERALS) tablet, Take 2 tablets by mouth daily., Disp: , Rfl:    pantoprazole  (PROTONIX ) 40 MG tablet, Take 1 tablet (40 mg total) by mouth daily., Disp: 90 tablet, Rfl: 3   sodium chloride  HYPERTONIC 3 % nebulizer solution, INHALE 1 VIAL BY VIA NEBULIZER TWICE A DAY, Disp: , Rfl:    Tiotropium Bromide -Olodaterol (STIOLTO RESPIMAT ) 2.5-2.5 MCG/ACT AERS, Inhale into the lungs daily., Disp: , Rfl:    Tiotropium Bromide -Olodaterol 2.5-2.5 MCG/ACT AERS, Inhale 2 puffs into the lungs daily., Disp: 1 each, Rfl: 11   predniSONE  (DELTASONE ) 20 MG tablet, 2 tabs po daily x 4 days (Patient not taking: Reported on 02/18/2024), Disp: 8 tablet, Rfl: 0  Current Facility-Administered Medications:    0.9 %  sodium chloride  infusion, 500 mL, Intravenous, Once, Aneita Gwendlyn DASEN, MD [2]  Social History Tobacco Use  Smoking Status Never  Smokeless Tobacco Never

## 2024-02-29 ENCOUNTER — Encounter (HOSPITAL_COMMUNITY)
Admission: RE | Admit: 2024-02-29 | Discharge: 2024-02-29 | Disposition: A | Discharge status: Home / Self Care | Source: Ambulatory Visit | Attending: Pulmonary Disease | Admitting: Pulmonary Disease

## 2024-02-29 DIAGNOSIS — J479 Bronchiectasis, uncomplicated: Secondary | ICD-10-CM

## 2024-02-29 NOTE — Progress Notes (Signed)
 Daily Session Note  Patient Details  Name: Patricia Johnston MRN: [id number] Date of Birth: May 05, 1950 Referring Provider:   Conrad Ports Pulmonary Rehab Walk Test from 02/18/2024 in [facility] for Heart, Vascular, & Lung Health  Referring Provider [name]    Encounter Date: 02/29/2024  Check In:  Session Check In - 02/29/24 1410       Check-In   Supervising physician immediately available to respond to emergencies [facility] MD immediately available    Physician(s) Orren Fabry, PA    Location [facility]    Staff Present Ronal Levin, RN, Maud Moats, MS, ACSM-CEP, Exercise Physiologist;Gregorio Worley Midge BS, ACSM-CEP, Exercise Physiologist    Virtual Visit No    Medication changes reported     No    Fall or balance concerns reported    No    Tobacco Cessation No Change    Warm-up and Cool-down Performed as group-led instruction    Resistance Training Performed Yes    VAD Patient? No    PAD/SET Patient? No      Pain Assessment   Currently in Pain? No/denies    Multiple Pain Sites No          Capillary Blood Glucose: No results found for this or any previous visit (from the past 24 hours).    Tobacco Use History[1]  Goals Met:  Exercise tolerated well No report of concerns or symptoms today Strength training completed today  Goals Unmet:  Not Applicable  Comments: Service time is from 1312 to 1435.    Dr. Slater Staff is Medical Director for Pulmonary Rehab at [facility].     [1]  Social History Tobacco Use  Smoking Status Never  Smokeless Tobacco Never

## 2024-03-07 ENCOUNTER — Encounter (HOSPITAL_COMMUNITY)
Admission: RE | Admit: 2024-03-07 | Discharge: 2024-03-07 | Disposition: A | Discharge status: Home / Self Care | Source: Ambulatory Visit | Attending: Pulmonary Disease | Admitting: Pulmonary Disease

## 2024-03-07 VITALS — Wt 128.1 lb

## 2024-03-07 DIAGNOSIS — J479 Bronchiectasis, uncomplicated: Secondary | ICD-10-CM | POA: Insufficient documentation

## 2024-03-07 NOTE — Progress Notes (Signed)
 Daily Session Note  Patient Details  Name: Patricia Johnston MRN: [id number] Date of Birth: [birth year] Referring Provider:   Conrad Ports Pulmonary Rehab Walk Test from 02/18/2024 in [facility] for Heart, Vascular, & Lung Health  Referring Provider [name]    Encounter Date: 03/07/2024  Check In:  Session Check In - 03/07/24 1320       Check-In   Supervising physician immediately available to respond to emergencies [facility] MD immediately available    Physician(s) Rosabel Mose, NP    Location [facility]    Staff Present Ronal Levin, RN, Maud Moats, MS, ACSM-CEP, Exercise Physiologist;Randi Midge BS, ACSM-CEP, Exercise Physiologist;Casey Claudene, RT    Virtual Visit No    Medication changes reported     No    Fall or balance concerns reported    No    Tobacco Cessation No Change    Warm-up and Cool-down Performed as group-led instruction    Resistance Training Performed Yes    VAD Patient? No    PAD/SET Patient? No      Pain Assessment   Currently in Pain? No/denies    Multiple Pain Sites No          Capillary Blood Glucose: No results found for this or any previous visit (from the past 24 hours).   Exercise Prescription Changes - 03/07/24 1400       Response to Exercise   Blood Pressure (Admit) 106/58    Blood Pressure (Exercise) 140/62    Blood Pressure (Exit) 100/56    Heart Rate (Admit) 78 bpm    Heart Rate (Exercise) 117 bpm    Heart Rate (Exit) 92 bpm    Oxygen Saturation (Admit) 96 %    Oxygen Saturation (Exercise) 90 %    Oxygen Saturation (Exit) 94 %    Rating of Perceived Exertion (Exercise) 13    Perceived Dyspnea (Exercise) 1    Duration Continue with 30 min of aerobic exercise without signs/symptoms of physical distress.    Intensity THRR unchanged      Progression   Progression Continue to progress workloads to maintain intensity without signs/symptoms of physical distress.      Resistance Training    Weight blue bands    Reps 10-15    Time 10 Minutes      Oxygen   Oxygen Intermittent      Treadmill   MPH 2.3    Grade 1    Minutes 15    METs 3.08      Elliptical   Level 1    Speed 1    Minutes 15    METs 5      Oxygen   Maintain Oxygen Saturation 88% or higher          Tobacco Use History[1]  Goals Met:  Proper associated with RPD/PD & O2 Sat Exercise tolerated well No report of concerns or symptoms today Strength training completed today  Goals Unmet:  Not Applicable  Comments: Service time is from 1310 to 1436.    Dr. Slater Staff is Medical Director for Pulmonary Rehab at [facility].     [1]  Social History Tobacco Use  Smoking Status Never  Smokeless Tobacco Never

## 2024-03-09 ENCOUNTER — Encounter (HOSPITAL_COMMUNITY)
Admission: RE | Admit: 2024-03-09 | Discharge: 2024-03-09 | Disposition: A | Discharge status: Home / Self Care | Source: Ambulatory Visit | Attending: Internal Medicine | Admitting: Internal Medicine

## 2024-03-09 DIAGNOSIS — J479 Bronchiectasis, uncomplicated: Secondary | ICD-10-CM | POA: Diagnosis not present

## 2024-03-09 NOTE — Progress Notes (Signed)
 Daily Session Note  Patient Details  Name: Patricia Johnston MRN: [id number] Date of Birth: Jul 07, 1950 Referring Provider:   Conrad Ports Pulmonary Rehab Walk Test from 02/18/2024 in [facility] for Heart, Vascular, & Lung Health  Referring Provider [name]    Encounter Date: 03/09/2024  Check In:  Session Check In - 03/09/24 1327       Check-In   Supervising physician immediately available to respond to emergencies [facility] MD immediately available    Physician(s) Damien Braver, NP    Location [facility]    Staff Present Ronal Levin, RN, BSN;Randi Midge BS, ACSM-CEP, Exercise Physiologist;Raquel Sayres Claudene, RT    Virtual Visit No    Medication changes reported     No    Fall or balance concerns reported    No    Tobacco Cessation No Change    Warm-up and Cool-down Performed as group-led instruction    Resistance Training Performed Yes    VAD Patient? No    PAD/SET Patient? No      Pain Assessment   Currently in Pain? No/denies    Multiple Pain Sites No          Capillary Blood Glucose: No results found for this or any previous visit (from the past 24 hours).    Tobacco Use History[1]  Goals Met:  Proper associated with RPD/PD & O2 Sat Independence with exercise equipment Exercise tolerated well No report of concerns or symptoms today Strength training completed today  Goals Unmet:  Not Applicable  Comments: Service time is from 1307 to 1439.    Dr. Slater Staff is Medical Director for Pulmonary Rehab at [facility].     [1]  Social History Tobacco Use  Smoking Status Never  Smokeless Tobacco Never

## 2024-03-14 ENCOUNTER — Encounter (HOSPITAL_COMMUNITY)
Admission: RE | Admit: 2024-03-14 | Discharge: 2024-03-14 | Disposition: A | Source: Ambulatory Visit | Attending: Internal Medicine | Admitting: Internal Medicine

## 2024-03-14 DIAGNOSIS — J479 Bronchiectasis, uncomplicated: Secondary | ICD-10-CM

## 2024-03-14 NOTE — Progress Notes (Signed)
 Daily Session Note  Patient Details  Name: Nahal Wanless MRN: [id number] Date of Birth: 15-Nov-1950 Referring Provider:   Conrad Ports Pul[facility] Rehab Walk Test from 02/18/2024 in [facility]  Referring Provider [name]    Encounter Date: 03/14/2024  Check In:  Session Check In - 03/14/24 1406       Check-In   Supervising physician immediately available to respond to emergencies [facility] MD immediately available    Physician(s) Rosaline Skains, NP    Location [facility]    Staff Present Ronal Levin, RN, BSN;Randi Midge HECKLE, ACSM-CEP, Exercise Physiologist;Evadne Ose Claudene West Quan, RN, Maud Moats, MS, ACSM-CEP, Exercise Physiologist    Virtual Visit No    Medication changes reported     No    Fall or balance concerns reported    No    Tobacco Cessation No Change    Warm-up and Cool-down Performed as group-led instruction    Resistance Training Performed Yes    VAD Patient? No    PAD/SET Patient? No      Pain Assessment   Currently in Pain? No/denies    Multiple Pain Sites No          Capillary Blood Glucose: No results found for this or any previous visit (from the past 24 hours).    Tobacco Use History[1]  Goals Met:  Proper associated with RPD/PD & O2 Sat Independence with exercise equipment Exercise tolerated well No report of concerns or symptoms today Strength training completed today  Goals Unmet:  Not Applicable  Comments: Service time is from 1317 to 1437.    Dr. Slater Staff is Medical Director for Pulmonary Rehab at [facility].     [1]  Social History Tobacco Use  Smoking Status Never  Smokeless Tobacco Never

## 2024-03-16 ENCOUNTER — Encounter (HOSPITAL_COMMUNITY)
Admission: RE | Admit: 2024-03-16 | Discharge: 2024-03-16 | Disposition: A | Source: Ambulatory Visit | Attending: Internal Medicine | Admitting: Internal Medicine

## 2024-03-16 DIAGNOSIS — J479 Bronchiectasis, uncomplicated: Secondary | ICD-10-CM | POA: Diagnosis not present

## 2024-03-16 NOTE — Progress Notes (Signed)
 Daily Session Note  Patient Details  Name: [name] MRN: [id number] Date of Birth: 1950-12-01 Referring Provider:   Conrad Ports [facility] Rehab Walk Test from 02/18/2024 in [facility]  Referring Provider [name]    Encounter Date: 03/16/2024  Check In:  Session Check In - 03/16/24 1334       Check-In   Supervising physician immediately available to respond to emergencies [facility] MD immediately available    Physician(s) Lum Louis, NP    Location [facility]    Staff Present Ronal Levin, RN, BSN;Randi Midge BS, ACSM-CEP, Exercise Physiologist;Casey Claudene Neita Moats, MS, ACSM-CEP, Exercise Physiologist    Virtual Visit No    Medication changes reported     No    Fall or balance concerns reported    No    Tobacco Cessation No Change    Warm-up and Cool-down Performed as group-led instruction    Resistance Training Performed Yes    VAD Patient? No    PAD/SET Patient? No      Pain Assessment   Currently in Pain? No/denies    Multiple Pain Sites No          Capillary Blood Glucose: No results found for this or any previous visit (from the past 24 hours).    Tobacco Use History[1]  Goals Met:  Independence with exercise equipment Exercise tolerated well Queuing for purse lip breathing No report of concerns or symptoms today Strength training completed today  Goals Unmet:  Not Applicable  Comments: Service time is from 1309 to 1439    Dr. Slater Staff is Medical Director for Pulmonary Rehab at [facility].     [1]  Social History Tobacco Use  Smoking Status Never  Smokeless Tobacco Never

## 2024-03-21 ENCOUNTER — Encounter (HOSPITAL_COMMUNITY)
Admission: RE | Admit: 2024-03-21 | Discharge: 2024-03-21 | Disposition: A | Source: Ambulatory Visit | Attending: Internal Medicine | Admitting: Internal Medicine

## 2024-03-21 VITALS — Wt 128.1 lb

## 2024-03-21 DIAGNOSIS — J479 Bronchiectasis, uncomplicated: Secondary | ICD-10-CM | POA: Diagnosis not present

## 2024-03-21 NOTE — Progress Notes (Signed)
 Daily Session Note  Patient Details  Name: Patricia Johnston MRN: [id number] Date of Birth: 1950-09-29 Referring Provider:   Conrad Ports Pulmonary Rehab Walk Test from 02/18/2024 in [facility]  Referring Provider [name]    Encounter Date: 03/21/2024  Check In:  Session Check In - 03/21/24 1318       Check-In   Supervising physician immediately available to respond to emergencies [facility] MD immediately available    Physician(s) Lum Louis, NP    Location [facility]    Staff Present Ronal Levin, RN, BSN;Randi Midge BS, ACSM-CEP, Exercise Physiologist;Casia Corti Claudene Neita Moats, MS, ACSM-CEP, Exercise Physiologist    Virtual Visit No    Medication changes reported     No    Fall or balance concerns reported    No    Tobacco Cessation No Change    Warm-up and Cool-down Performed as group-led instruction    Resistance Training Performed Yes    VAD Patient? No    PAD/SET Patient? No      Pain Assessment   Currently in Pain? No/denies    Multiple Pain Sites No          Capillary Blood Glucose: No results found for this or any previous visit (from the past 24 hours).   Exercise Prescription Changes - 03/21/24 1500       Response to Exercise   Blood Pressure (Admit) 118/62    Blood Pressure (Exercise) 118/64    Blood Pressure (Exit) 120/60    Heart Rate (Admit) 73 bpm    Heart Rate (Exercise) 113 bpm    Heart Rate (Exit) 86 bpm    Oxygen Saturation (Admit) 94 %    Oxygen Saturation (Exercise) 91 %    Oxygen Saturation (Exit) 94 %    Rating of Perceived Exertion (Exercise) 11    Perceived Dyspnea (Exercise) 1    Duration Continue with 30 min of aerobic exercise without signs/symptoms of physical distress.    Intensity THRR unchanged      Progression   Progression Continue to progress workloads to maintain intensity without signs/symptoms of physical distress.      Resistance Training    Weight black bands    Reps 10-15    Time 10 Minutes      Treadmill   MPH 2.7    Grade 2.5    Minutes 15    METs 4      Elliptical   Level 1    Speed 1    Minutes 15    METs 4.8          Tobacco Use History[1]  Goals Met:  Proper associated with RPD/PD & O2 Sat Independence with exercise equipment Exercise tolerated well No report of concerns or symptoms today Strength training completed today  Goals Unmet:  Not Applicable  Comments: Service time is from 1306 to 1430.    Dr. Slater Staff is Medical Director for Pulmonary Rehab at [facility] [place].     [1]  Social History Tobacco Use  Smoking Status Never  Smokeless Tobacco Never

## 2024-03-22 NOTE — Progress Notes (Signed)
 Pulmonary Individual Treatment Plan  Patient Details  Name: Patricia Johnston MRN: [id number] Date of Birth: [birth year] Referring Provider:   Conrad Ports Pulmonary Rehab Walk Test from 02/18/2024 in [facility]  Referring Provider [name]    Initial Encounter Date:  Flowsheet Row Pulmonary Rehab Walk Test from 02/18/2024 in [facility]  Date 02/18/24    Visit Diagnosis: Bronchiectasis without complication (HCC)  Patient's Home Medications on Admission:  Current Medications[1]  Past Medical History: Past Medical History:  Diagnosis Date   Anxiety    Asthma    COPD (chronic obstructive pulmonary disease) (HCC)    pt states due to [employer] contracted COPD   Difficult intubation    Pt does not remember details over 30 years ago   Eczema    Esophageal thickening 10/13/2018   on CT- [facility]   GERD (gastroesophageal reflux disease)    Hiatal hernia    Osteoporosis    PONV (postoperative nausea and vomiting)    Pulmonary emphysema (HCC)    Pulmonary nodules    Traumatic arthritis    Umbilical hernia    Wears glasses     Tobacco Use: Tobacco Use History[2]  Labs: Review Flowsheet        No data to display          Capillary Blood Glucose: Lab Results  Component Value Date   GLUCAP 99 06/16/2017     Pulmonary Assessment Scores:  Pulmonary Assessment Scores     Row Name 02/18/24 1307         ADL UCSD   ADL Phase Entry     SOB Score total 61       CAT Score   CAT Score 21       mMRC Score   mMRC Score 3       UCSD: Self-administered rating of dyspnea associated with activities of daily living (ADLs) 6-point scale (0 = not at all to 5 = maximal or unable to do because of breathlessness)  Scoring Scores range from 0 to 120.  Minimally important difference is 5 units  CAT: CAT can identify the  health impairment of COPD patients and is better correlated with disease progression.  CAT has a scoring range of zero to 40. The CAT score is classified into four groups of low (less than 10), medium (10 - 20), high (21-30) and very high (31-40) based on the impact level of disease on health status. A CAT score over 10 suggests significant symptoms.  A worsening CAT score could be explained by an exacerbation, poor medication adherence, poor inhaler technique, or progression of COPD or comorbid conditions.  CAT MCID is 2 points  mMRC: mMRC (Modified Medical Research Council) Dyspnea Scale is used to assess the degree of baseline functional disability in patients of respiratory disease due to dyspnea. No minimal important difference is established. A decrease in score of 1 point or greater is considered a positive change.   Pulmonary Function Assessment:  Pulmonary Function Assessment - 02/18/24 1401       Breath   Bilateral Breath Sounds Clear    Shortness of Breath Yes;Limiting activity          Exercise Target Goals: Exercise Program Goal: Individual exercise prescription set using results from initial 6 min walk test and THRR while considering  patients activity barriers and safety.   Exercise Prescription Goal: Initial  exercise prescription builds to 30-45 minutes a day of aerobic activity, 2-3 days per week.  Home exercise guidelines will be given to patient during program as part of exercise prescription that the participant will acknowledge.  Activity Barriers & Risk Stratification:  Activity Barriers & Cardiac Risk Stratification - 02/18/24 1304       Activity Barriers & Cardiac Risk Stratification   Activity Barriers Deconditioning;Muscular Weakness;Shortness of Breath          6 Minute Walk:  6 Minute Walk     Row Name 02/18/24 1412         6 Minute Walk   Phase Initial     Distance 1680 feet     Walk Time 6 minutes     # of Rest Breaks 0     MPH 3.18      METS 4.47     RPE 12     Perceived Dyspnea  2     VO2 Peak 15.63     Symptoms No     Resting HR 77 bpm     Resting BP 98/60     Resting Oxygen Saturation  95 %     Exercise Oxygen Saturation  during 6 min walk 85 %     Max Ex. HR 131 bpm     Max Ex. BP 180/72     2 Minute Post BP 124/68       Interval HR   1 Minute HR 81     2 Minute HR 116     3 Minute HR 126     4 Minute HR 127     5 Minute HR 129     6 Minute HR 131     2 Minute Post HR 82     Interval Heart Rate? Yes       Interval Oxygen   Interval Oxygen? Yes     Baseline Oxygen Saturation % 95 %     1 Minute Oxygen Saturation % 93 %     1 Minute Liters of Oxygen 0 L     2 Minute Oxygen Saturation % 90 %     3 Minute Oxygen Saturation % 89 %  3:41-4:00 O2 85%, 2L [place] applied     3 Minute Liters of Oxygen 123 L     4 Minute Oxygen Saturation % 90 %     4 Minute Liters of Oxygen 127 L     5 Minute Oxygen Saturation % 94 %     5 Minute Liters of Oxygen 129 L     6 Minute Oxygen Saturation % 94 %     6 Minute Liters of Oxygen 131 L     2 Minute Post Oxygen Saturation % 96 %     2 Minute Post Liters of Oxygen 82 L        Oxygen Initial Assessment:  Oxygen Initial Assessment - 02/18/24 1401       Home Oxygen   Home Oxygen Device None    Sleep Oxygen Prescription None    Home Exercise Oxygen Prescription None    Home Resting Oxygen Prescription None      Initial 6 min Walk   Oxygen Used Continuous    Liters per minute 2      Program Oxygen Prescription   Program Oxygen Prescription Continuous    Liters per minute 2      Intervention   Short Term Goals To learn and exhibit compliance with exercise,  home and travel O2 prescription;To learn and understand importance of maintaining oxygen saturations>88%;To learn and demonstrate proper use of respiratory medications;To learn and demonstrate proper pursed lip breathing techniques or other breathing techniques. ;To learn and understand importance of monitoring  SPO2 with pulse oximeter and demonstrate accurate use of the pulse oximeter.    Long  Term Goals Exhibits compliance with exercise, home  and travel O2 prescription;Maintenance of O2 saturations>88%;Compliance with respiratory medication;Demonstrates proper use of MDIs;Exhibits proper breathing techniques, such as pursed lip breathing or other method taught during program session;Verbalizes importance of monitoring SPO2 with pulse oximeter and return demonstration          Oxygen Re-Evaluation:  Oxygen Re-Evaluation     Row Name 02/18/24 1502 03/13/24 1341           Program Oxygen Prescription   Program Oxygen Prescription Continuous Continuous;None      Liters per minute 2 --        Home Oxygen   Home Oxygen Device None None      Sleep Oxygen Prescription None None      Home Exercise Oxygen Prescription None None      Home Resting Oxygen Prescription None None        Goals/Expected Outcomes   Short Term Goals To learn and exhibit compliance with exercise, home and travel O2 prescription;To learn and understand importance of maintaining oxygen saturations>88%;To learn and demonstrate proper use of respiratory medications;To learn and demonstrate proper pursed lip breathing techniques or other breathing techniques. ;To learn and understand importance of monitoring SPO2 with pulse oximeter and demonstrate accurate use of the pulse oximeter. To learn and exhibit compliance with exercise, home and travel O2 prescription;To learn and understand importance of maintaining oxygen saturations>88%;To learn and demonstrate proper use of respiratory medications;To learn and demonstrate proper pursed lip breathing techniques or other breathing techniques. ;To learn and understand importance of monitoring SPO2 with pulse oximeter and demonstrate accurate use of the pulse oximeter.      Long  Term Goals Exhibits compliance with exercise, home  and travel O2 prescription;Maintenance of O2  saturations>88%;Compliance with respiratory medication;Demonstrates proper use of MDIs;Exhibits proper breathing techniques, such as pursed lip breathing or other method taught during program session;Verbalizes importance of monitoring SPO2 with pulse oximeter and return demonstration Exhibits compliance with exercise, home  and travel O2 prescription;Maintenance of O2 saturations>88%;Compliance with respiratory medication;Demonstrates proper use of MDIs;Exhibits proper breathing techniques, such as pursed lip breathing or other method taught during program session;Verbalizes importance of monitoring SPO2 with pulse oximeter and return demonstration      Comments -- Pt has not needed O2 for exercise as was initially expected.      Goals/Expected Outcomes Compliance and understanding of oxygen saturation and breathing techniques to decrease shortness of breath Compliance and understanding of oxygen saturation and breathing techniques to decrease shortness of breath         Oxygen Discharge (Final Oxygen Re-Evaluation):  Oxygen Re-Evaluation - 03/13/24 1341       Program Oxygen Prescription   Program Oxygen Prescription Continuous;None      Home Oxygen   Home Oxygen Device None    Sleep Oxygen Prescription None    Home Exercise Oxygen Prescription None    Home Resting Oxygen Prescription None      Goals/Expected Outcomes   Short Term Goals To learn and exhibit compliance with exercise, home and travel O2 prescription;To learn and understand importance of maintaining oxygen saturations>88%;To learn and demonstrate proper use of  respiratory medications;To learn and demonstrate proper pursed lip breathing techniques or other breathing techniques. ;To learn and understand importance of monitoring SPO2 with pulse oximeter and demonstrate accurate use of the pulse oximeter.    Long  Term Goals Exhibits compliance with exercise, home  and travel O2 prescription;Maintenance of O2  saturations>88%;Compliance with respiratory medication;Demonstrates proper use of MDIs;Exhibits proper breathing techniques, such as pursed lip breathing or other method taught during program session;Verbalizes importance of monitoring SPO2 with pulse oximeter and return demonstration    Comments Pt has not needed O2 for exercise as was initially expected.    Goals/Expected Outcomes Compliance and understanding of oxygen saturation and breathing techniques to decrease shortness of breath          Initial Exercise Prescription:  Initial Exercise Prescription - 02/18/24 1300       Date of Initial Exercise RX and Referring Provider   Date 02/18/24    Referring Provider [name]    Expected Discharge Date 05/23/24      Oxygen   Oxygen Intermittent    Liters 0-2    Maintain Oxygen Saturation 88% or higher      Treadmill   MPH 2    Grade 1    Minutes 15    METs 2.67      Elliptical   Level 1    Speed 1    Minutes 15    METs 2.6      Prescription Details   Frequency (times per week) 2    Duration Progress to 30 minutes of continuous aerobic without signs/symptoms of physical distress      Intensity   THRR 40-80% of Max Heartrate 59-118    Ratings of Perceived Exertion 11-13    Perceived Dyspnea 0-4      Progression   Progression Continue progressive overload as per policy without signs/symptoms or physical distress.      Resistance Training   Training Prescription Yes    Weight blue bands    Reps 10-15          Perform Capillary Blood Glucose checks as needed.  Exercise Prescription Changes:   Exercise Prescription Changes     Row Name 03/07/24 1400 03/21/24 1500           Response to Exercise   Blood Pressure (Admit) 106/58 118/62      Blood Pressure (Exercise) 140/62 118/64      Blood Pressure (Exit) 100/56 120/60      Heart Rate (Admit) 78 bpm 73 bpm      Heart Rate (Exercise) 117 bpm 113 bpm      Heart Rate (Exit) 92 bpm 86 bpm      Oxygen  Saturation (Admit) 96 % 94 %      Oxygen Saturation (Exercise) 90 % 91 %      Oxygen Saturation (Exit) 94 % 94 %      Rating of Perceived Exertion (Exercise) 13 11      Perceived Dyspnea (Exercise) 1 1      Duration Continue with 30 min of aerobic exercise without signs/symptoms of physical distress. Continue with 30 min of aerobic exercise without signs/symptoms of physical distress.      Intensity THRR unchanged THRR unchanged        Progression   Progression Continue to progress workloads to maintain intensity without signs/symptoms of physical distress. Continue to progress workloads to maintain intensity without signs/symptoms of physical distress.        Resistance Training  Weight blue bands black bands      Reps 10-15 10-15      Time 10 Minutes 10 Minutes        Oxygen   Oxygen Intermittent --        Treadmill   MPH 2.3 2.7      Grade 1 2.5      Minutes 15 15      METs 3.08 4        Elliptical   Level 1 1      Speed 1 1      Minutes 15 15      METs 5 4.8        Oxygen   Maintain Oxygen Saturation 88% or higher --         Exercise Comments:   Exercise Goals and Review:   Exercise Goals     Row Name 02/18/24 1400             Exercise Goals   Increase Physical Activity Yes       Intervention Provide advice, education, support and counseling about physical activity/exercise needs.;Develop an individualized exercise prescription for aerobic and resistive training based on initial evaluation findings, risk stratification, comorbidities and participant's personal goals.       Expected Outcomes Short Term: Attend rehab on a regular basis to increase amount of physical activity.;Long Term: Exercising regularly at least 3-5 days a week.;Long Term: Add in home exercise to make exercise part of routine and to increase amount of physical activity.       Increase Strength and Stamina Yes       Intervention Provide advice, education, support and counseling about  physical activity/exercise needs.;Develop an individualized exercise prescription for aerobic and resistive training based on initial evaluation findings, risk stratification, comorbidities and participant's personal goals.       Expected Outcomes Short Term: Perform resistance training exercises routinely during rehab and add in resistance training at home;Long Term: Improve cardiorespiratory fitness, muscular endurance and strength as measured by increased METs and functional capacity ( );Short Term: Increase workloads from initial exercise prescription for resistance, speed, and METs.       Able to understand and use rate of perceived exertion (RPE) scale Yes       Intervention Provide education and explanation on how to use RPE scale       Expected Outcomes Short Term: Able to use RPE daily in rehab to express subjective intensity level;Long Term:  Able to use RPE to guide intensity level when exercising independently       Able to understand and use Dyspnea scale Yes       Intervention Provide education and explanation on how to use Dyspnea scale       Expected Outcomes Short Term: Able to use Dyspnea scale daily in rehab to express subjective sense of shortness of breath during exertion;Long Term: Able to use Dyspnea scale to guide intensity level when exercising independently       Knowledge and understanding of Target Heart Rate Range (THRR) Yes       Intervention Provide education and explanation of THRR including how the numbers were predicted and where they are located for reference       Expected Outcomes Short Term: Able to state/look up THRR;Short Term: Able to use daily as guideline for intensity in rehab;Long Term: Able to use THRR to govern intensity when exercising independently       Understanding of Exercise Prescription Yes  Intervention Provide education, explanation, and written materials on patient's individual exercise prescription       Expected Outcomes Short Term:  Able to explain program exercise prescription;Long Term: Able to explain home exercise prescription to exercise independently          Exercise Goals Re-Evaluation :  Exercise Goals Re-Evaluation     Row Name 02/18/24 1502 03/13/24 1339           Exercise Goal Re-Evaluation   Exercise Goals Review Increase Physical Activity;Able to understand and use Dyspnea scale;Understanding of Exercise Prescription;Increase Strength and Stamina;Knowledge and understanding of Target Heart Rate Range (THRR);Able to understand and use rate of perceived exertion (RPE) scale Increase Physical Activity;Able to understand and use Dyspnea scale;Understanding of Exercise Prescription;Increase Strength and Stamina;Knowledge and understanding of Target Heart Rate Range (THRR);Able to understand and use rate of perceived exertion (RPE) scale      Comments Pt to begin exercise 12/30. Will progress as tolerated. Pt has completed 3 exercise sessions. She is doing well. She is exercising on the upright elliptical for 15 min, level 1, incline 1, METs 4.7. She then is walking on the treadmill for 15 min, 2.3 mph, 2 % incline, METs 3.2. She is motivated and progressing quickly. Perform warm up and cool down standing, using blue bands, 5.8 lbs. Will progress as tolerated.      Expected Outcomes Through exercise at rehab and home, the patient will decrease shortness of breath with daily activities and feel confident in carrying out an exercise regimen at home. Through exercise at rehab and home, the patient will decrease shortness of breath with daily activities and feel confident in carrying out an exercise regimen at home.         Discharge Exercise Prescription (Final Exercise Prescription Changes):  Exercise Prescription Changes - 03/21/24 1500       Response to Exercise   Blood Pressure (Admit) 118/62    Blood Pressure (Exercise) 118/64    Blood Pressure (Exit) 120/60    Heart Rate (Admit) 73 bpm    Heart Rate  (Exercise) 113 bpm    Heart Rate (Exit) 86 bpm    Oxygen Saturation (Admit) 94 %    Oxygen Saturation (Exercise) 91 %    Oxygen Saturation (Exit) 94 %    Rating of Perceived Exertion (Exercise) 11    Perceived Dyspnea (Exercise) 1    Duration Continue with 30 min of aerobic exercise without signs/symptoms of physical distress.    Intensity THRR unchanged      Progression   Progression Continue to progress workloads to maintain intensity without signs/symptoms of physical distress.      Resistance Training   Weight black bands    Reps 10-15    Time 10 Minutes      Treadmill   MPH 2.7    Grade 2.5    Minutes 15    METs 4      Elliptical   Level 1    Speed 1    Minutes 15    METs 4.8          Nutrition:  Target Goals: Understanding of nutrition guidelines, daily intake of sodium 1500mg , cholesterol 200mg , calories 30% from fat and 7% or less from saturated fats, daily to have 5 or more servings of fruits and vegetables.  Biometrics:  Pre Biometrics - 02/18/24 1400       Pre Biometrics   Grip Strength 22 kg   new dynanometer  Nutrition Therapy Plan and Nutrition Goals:  Nutrition Therapy & Goals - 02/29/24 1427       Nutrition Therapy   Diet General Healthy Diet      Personal Nutrition Goals   Nutrition Goal Patient to maintain diet quality by using the plate method as a guide for meal planning to include lean protein/plant protein, fruits, vegetables, whole grains, nonfat dairy as part of a well-balanced diet.    Personal Goal #2 Patient to maintain weight throughout duration of pulmonary rehab.    Comments Patient has medical history of COPD, bronchiectasis, acute and chronic respiratory failure, severe persistent asthma. Endorses following healthy eating pattern; denies consuming fried foods as well as junk food. Current diet includes nutrient dense foods such as whole grains, beans, vegetables. Meals include lean sources of protein such as  chicken. Stable weight noted over past month; pt motivated to maintain current weight. Supplements diet with Glucerna as needed. Patient will benefit from participation in pulmonary rehab for nutrition and exercise support.      Intervention Plan   Intervention Prescribe, educate and counsel regarding individualized specific dietary modifications aiming towards targeted core components such as weight, hypertension, lipid management, diabetes, heart failure and other comorbidities.    Expected Outcomes Short Term Goal: Understand basic principles of dietary content, such as calories, fat, sodium, cholesterol and nutrients.;Long Term Goal: Adherence to prescribed nutrition plan.          Nutrition Assessments:  MEDIFICTS Score Key: >=70 Need to make dietary changes  40-70 Heart Healthy Diet <= 40 Therapeutic Level Cholesterol Diet  Flowsheet Row PULMONARY REHAB OTHER RESPIRATORY from 03/07/2024 in [facility]  Picture Your Plate Total Score on Admission 78   Picture Your Plate Scores: <59 Unhealthy dietary pattern with much room for improvement. 41-50 Dietary pattern unlikely to meet recommendations for good health and room for improvement. 51-60 More healthful dietary pattern, with some room for improvement.  >60 Healthy dietary pattern, although there may be some specific behaviors that could be improved.    Nutrition Goals Re-Evaluation:   Nutrition Goals Discharge (Final Nutrition Goals Re-Evaluation):   Psychosocial: Target Goals: Acknowledge presence or absence of significant depression and/or stress, maximize coping skills, provide positive support system. Participant is able to verbalize types and ability to use techniques and skills needed for reducing stress and depression.  Initial Review & Psychosocial Screening:  Initial Psych Review & Screening - 02/18/24 1304       Initial Review   Current issues with None  Identified      Family Dynamics   Good Support System? Yes    Comments Pt has good support through her family and church.      Barriers   Psychosocial barriers to participate in program There are no identifiable barriers or psychosocial needs.      Screening Interventions   Interventions Encouraged to exercise    Expected Outcomes Short Term goal: Utilizing psychosocial counselor, staff and physician to assist with identification of specific Stressors or current issues interfering with healing process. Setting desired goal for each stressor or current issue identified.;Long Term Goal: Stressors or current issues are controlled or eliminated.;Short Term goal: Identification and review with participant of any Quality of Life or Depression concerns found by scoring the questionnaire.;Long Term goal: The participant improves quality of Life and PHQ9 Scores as seen by post scores and/or verbalization of changes  Quality of Life Scores:  Scores of 19 and below usually indicate a poorer quality of life in these areas.  A difference of  2-3 points is a clinically meaningful difference.  A difference of 2-3 points in the total score of the Quality of Life Index has been associated with significant improvement in overall quality of life, self-image, physical symptoms, and general health in studies assessing change in quality of life.  PHQ-9: Review Flowsheet  More data exists      02/18/2024 08/23/2023 04/08/2023 01/08/2023 11/21/2021  Depression screen PHQ 2/9  Decreased Interest 0 0 0 0 0 0  Down, Depressed, Hopeless 0 0 0 0 0 0  PHQ - 2 Score 0 0 0 0 0 0  Altered sleeping 0 0 0 0 -  Tired, decreased energy 0 0 0 0 -  Change in appetite 0 0 0 0 -  Feeling bad or failure about yourself  0 0 0 0 -  Trouble concentrating 0 0 0 0 -  Moving slowly or fidgety/restless 0 0 0 0 -  Suicidal thoughts 0 0 0 0 -  PHQ-9 Score 0 0  0  0  -  Difficult doing work/chores Not difficult at all Not  difficult at all Not difficult at all Not difficult at all -    Details       Data saved with a previous flowsheet row definition   Multiple values from one day are sorted in reverse-chronological order        Interpretation of Total Score  Total Score Depression Severity:  1-4 = Minimal depression, 5-9 = Mild depression, 10-14 = Moderate depression, 15-19 = Moderately severe depression, 20-27 = Severe depression   Psychosocial Evaluation and Intervention:  Psychosocial Evaluation - 02/18/24 1402       Psychosocial Evaluation & Interventions   Interventions Encouraged to exercise with the program and follow exercise prescription    Comments Patricia Johnston denies any psychosocial barriers or concerns at this time    Expected Outcomes For Patricia Johnston to participate in PR free of any psychosocial barriers or concerns    Continue Psychosocial Services  No Follow up required          Psychosocial Re-Evaluation:  Psychosocial Re-Evaluation     Row Name 02/22/24 1616 03/20/24 1555           Psychosocial Re-Evaluation   Current issues with None Identified None Identified      Comments 30 day psy/soc eval is as follows: No changes since orientation. Patricia Johnston is scheduled to start the program next week. 30 day psy/soc eval is as follows: Patricia Johnston denies any new psy/soc barriers or concerns. She states she has good support through her church. She declines needing any additional resources or referrals.      Expected Outcomes For Patricia Johnston to continue to be free of any psy/soc barriers of concerns post PR For Patricia Johnston to continue to be free of any psy/soc barriers of concerns post PR      Interventions Encouraged to attend Pulmonary Rehabilitation for the exercise Encouraged to attend Pulmonary Rehabilitation for the exercise      Continue Psychosocial Services  Follow up required by staff Follow up required by staff         Psychosocial Discharge (Final Psychosocial Re-Evaluation):   Psychosocial Re-Evaluation - 03/20/24 1555       Psychosocial Re-Evaluation   Current issues with None Identified    Comments 30 day psy/soc eval is as follows: Patricia Johnston denies any  new psy/soc barriers or concerns. She states she has good support through her church. She declines needing any additional resources or referrals.    Expected Outcomes For Patricia Johnston to continue to be free of any psy/soc barriers of concerns post PR    Interventions Encouraged to attend Pulmonary Rehabilitation for the exercise    Continue Psychosocial Services  Follow up required by staff          Education: Education Goals: Education classes will be provided on a weekly basis, covering required topics. Participant will state understanding/return demonstration of topics presented.  Learning Barriers/Preferences:  Learning Barriers/Preferences - 02/18/24 1402       Learning Barriers/Preferences   Learning Barriers None    Learning Preferences None          Education Topics: Know Your Numbers Group instruction that is supported by a PowerPoint presentation. Instructor discusses importance of knowing and understanding resting, exercise, and post-exercise oxygen saturation, heart rate, and blood pressure. Oxygen saturation, heart rate, blood pressure, rating of perceived exertion, and dyspnea are reviewed along with a normal range for these values.  Flowsheet Row PULMONARY REHAB OTHER RESPIRATORY from 03/04/2023 in [facility] for Heart, Vascular, & Lung Health  Date 03/04/23  Educator EP  Instruction Review Code 1- Verbalizes Understanding    Exercise for the Pulmonary Patient Group instruction that is supported by a PowerPoint presentation. Instructor discusses benefits of exercise, core components of exercise, frequency, duration, and intensity of an exercise routine, importance of utilizing pulse oximetry during exercise, safety while exercising, and options of places to exercise  outside of rehab.  Flowsheet Row PULMONARY REHAB OTHER RESPIRATORY from 02/25/2023 in [facility] for Heart, Vascular, & Lung Health  Date 02/25/23  Educator EP  Instruction Review Code 1- Verbalizes Understanding    MET Level  Group instruction provided by PowerPoint, verbal discussion, and written material to support subject matter. Instructor reviews what METs are and how to increase METs.  Flowsheet Row PULMONARY REHAB OTHER RESPIRATORY from 01/21/2023 in [facility] for Heart, Vascular, & Lung Health  Date 01/21/23  Educator EP  Instruction Review Code 1- Verbalizes Understanding    Pulmonary Medications Verbally interactive group education provided by instructor with focus on inhaled medications and proper administration. Flowsheet Row PULMONARY REHAB OTHER RESPIRATORY from 02/18/2023 in [facility] for Heart, Vascular, & Lung Health  Date 02/18/23  Educator RT  Instruction Review Code 1- Verbalizes Understanding    Anatomy and Physiology of the Respiratory System Group instruction provided by PowerPoint, verbal discussion, and written material to support subject matter. Instructor reviews respiratory cycle and anatomical components of the respiratory system and their functions. Instructor also reviews differences in obstructive and restrictive respiratory diseases with examples of each.  Flowsheet Row PULMONARY REHAB OTHER RESPIRATORY from 02/11/2023 in [facility] for Heart, Vascular, & Lung Health  Date 02/11/23  Educator RT  Instruction Review Code 1- Verbalizes Understanding    Oxygen Safety Group instruction provided by PowerPoint, verbal discussion, and written material to support subject matter. There is an overview of What is Oxygen and Why do we need it.  Instructor also reviews how to create a safe environment for oxygen use, the importance of using oxygen as  prescribed, and the risks of noncompliance. There is a brief discussion on traveling with oxygen and resources the patient may utilize. Flowsheet Row PULMONARY REHAB OTHER RESPIRATORY from 03/09/2024 in [facility], Vascular, & Lung Health  Date 03/09/24  Educator RN  Instruction Review Code 1- Verbalizes Understanding    Oxygen Use Group instruction provided by PowerPoint, verbal discussion, and written material to discuss how supplemental oxygen is prescribed and different types of oxygen supply systems. Resources for more information are provided.  Flowsheet Row PULMONARY REHAB OTHER RESPIRATORY from 03/16/2024 in [facility] for Heart, Vascular, & Lung Health  Date 03/16/24  Educator RT  Instruction Review Code 1- Verbalizes Understanding    Breathing Techniques Group instruction that is supported by demonstration and informational handouts. Instructor discusses the benefits of pursed lip and diaphragmatic breathing and detailed demonstration on how to perform both.  Flowsheet Row PULMONARY REHAB OTHER RESPIRATORY from 03/25/2023 in [facility] for Heart, Vascular, & Lung Health  Date 03/25/23  Educator RN  Instruction Review Code 1- Verbalizes Understanding     Risk Factor Reduction Group instruction that is supported by a PowerPoint presentation. Instructor discusses the definition of a risk factor, different risk factors for pulmonary disease, and how the heart and lungs work together. Flowsheet Row PULMONARY REHAB OTHER RESPIRATORY from 01/14/2023 in [facility] for Heart, Vascular, & Lung Health  Date 01/14/23  Educator EP  Instruction Review Code 1- Verbalizes Understanding    Pulmonary Diseases Group instruction provided by PowerPoint, verbal discussion, and written material to support subject matter. Instructor gives an overview of the different type of pulmonary diseases.  There is also a discussion on risk factors and symptoms as well as ways to manage the diseases. Flowsheet Row PULMONARY REHAB OTHER RESPIRATORY from 02/04/2023 in [facility] for Heart, Vascular, & Lung Health  Date 02/04/23  Educator RT  Instruction Review Code 1- Verbalizes Understanding    Stress and Energy Conservation Group instruction provided by PowerPoint, verbal discussion, and written material to support subject matter. Instructor gives an overview of stress and the impact it can have on the body. Instructor also reviews ways to reduce stress. There is also a discussion on energy conservation and ways to conserve energy throughout the day.   Warning Signs and Symptoms Group instruction provided by PowerPoint, verbal discussion, and written material to support subject matter. Instructor reviews warning signs and symptoms of stroke, heart attack, cold and flu. Instructor also reviews ways to prevent the spread of infection.   Other Education Group or individual verbal, written, or video instructions that support the educational goals of the pulmonary rehab program. Flowsheet Row PULMONARY REHAB CHRONIC OBSTRUCTIVE PULMONARY DISEASE from 05/01/2021 in [facility] for Heart, Vascular, & Lung Health  Date 04/17/21  Educator --  [facility] Plate]     Knowledge Questionnaire Score:  Knowledge Questionnaire Score - 02/18/24 1302       Knowledge Questionnaire Score   Pre Score 17/18          Core Components/Risk Factors/Patient Goals at Admission:  Personal Goals and Risk Factors at Admission - 02/18/24 1402       Core Components/Risk Factors/Patient Goals on Admission    Weight Management Yes;Weight Maintenance    Intervention Weight Management: Develop a combined nutrition and exercise program designed to reach desired caloric intake, while maintaining appropriate intake of nutrient and fiber, sodium and fats, and appropriate energy  expenditure required for the weight goal.;Weight Management: Provide education and appropriate resources to help participant work on and attain dietary goals.;Weight Management/Obesity: Establish reasonable short term and long  term weight goals.    Expected Outcomes Short Term: Continue to assess and modify interventions until short term weight is achieved;Long Term: Adherence to nutrition and physical activity/exercise program aimed toward attainment of established weight goal;Weight Maintenance: Understanding of the daily nutrition guidelines, which includes 25-35% calories from fat, 7% or less cal from saturated fats, less than 200mg  cholesterol, less than 1.5gm of sodium, & 5 or more servings of fruits and vegetables daily;Understanding recommendations for meals to include 15-35% energy as protein, 25-35% energy from fat, 35-60% energy from carbohydrates, less than 200mg  of dietary cholesterol, 20-35 gm of total fiber daily;Understanding of distribution of calorie intake throughout the day with the consumption of 4-5 meals/snacks    Improve shortness of breath with ADL's Yes    Intervention Provide education, individualized exercise plan and daily activity instruction to help decrease symptoms of SOB with activities of daily living.    Expected Outcomes Short Term: Improve cardiorespiratory fitness to achieve a reduction of symptoms when performing ADLs;Long Term: Be able to perform more ADLs without symptoms or delay the onset of symptoms          Core Components/Risk Factors/Patient Goals Review:   Goals and Risk Factor Review     Row Name 02/22/24 1619 03/20/24 1556           Core Components/Risk Factors/Patient Goals Review   Personal Goals Review Weight Management/Obesity;Improve shortness of breath with ADL's;Develop more efficient breathing techniques such as purse lipped breathing and diaphragmatic breathing and practicing self-pacing with activity. Weight Management/Obesity;Improve  shortness of breath with ADL's;Develop more efficient breathing techniques such as purse lipped breathing and diaphragmatic breathing and practicing self-pacing with activity.      Review 30 day Core Components/Risk Factors/Patient Goals Review is as follows: Unable to assess goals yet. Patricia Johnston is scheduled to start the program next week. Monthly review of patient's Core Components/Risk Factors/Patient Goals: Goal in progress for improving her shortness of breath with ADLs. Patricia Johnston is trying to build up her strength and endurance. She is exercising on the elliptical and treadmill. Her oxygen saturation has been WNL on room air. We are working with her to decrease her shortness of breath. Goal met for developing more efficient breathing techniques such as purse lipped breathing and diaphragmatic breathing; and practicing self-pacing with activity. Patricia Johnston has been in the program 3x and can initiate purse lipped breathing while short of breath. She demonstrates this while performing the warmup and while exercising. She works on diaphragmatic breathing at home. Goal in progress for Patricia Johnston to maintain her weight. She is working on the [employer] with our dietitian. She will continue to benefit from PR for nutrition, education, exercise, and lifestyle modification.      Expected Outcomes Pt will continue to show progress toward meeting expected goals and outcomes. Pt will continue to show progress toward meeting expected goals and outcomes.         Core Components/Risk Factors/Patient Goals at Discharge (Final Review):   Goals and Risk Factor Review - 03/20/24 1556       Core Components/Risk Factors/Patient Goals Review   Personal Goals Review Weight Management/Obesity;Improve shortness of breath with ADL's;Develop more efficient breathing techniques such as purse lipped breathing and diaphragmatic breathing and practicing self-pacing with activity.    Review Monthly review of patient's Core  Components/Risk Factors/Patient Goals: Goal in progress for improving her shortness of breath with ADLs. Patricia Johnston is trying to build up her strength and endurance. She is exercising on the elliptical and  treadmill. Her oxygen saturation has been WNL on room air. We are working with her to decrease her shortness of breath. Goal met for developing more efficient breathing techniques such as purse lipped breathing and diaphragmatic breathing; and practicing self-pacing with activity. Patricia Johnston has been in the program 3x and can initiate purse lipped breathing while short of breath. She demonstrates this while performing the warmup and while exercising. She works on diaphragmatic breathing at home. Goal in progress for Patricia Johnston to maintain her weight. She is working on the [employer] with our dietitian. She will continue to benefit from PR for nutrition, education, exercise, and lifestyle modification.    Expected Outcomes Pt will continue to show progress toward meeting expected goals and outcomes.          ITP Comments:   Comments: Pt is making expected progress toward Pulmonary Rehab goals after completing 6 session(s). Recommend continued exercise, life style modification, education, and utilization of breathing techniques to increase stamina and strength, while also decreasing shortness of breath with exertion.  Dr. Slater Staff is Medical Director for Pulmonary Rehab at [facility].       [1]  Current Outpatient Medications:    albuterol  (PROVENTIL ) (2.5 MG/3ML) 0.083% nebulizer solution, Take 2.5 mg by nebulization as needed for wheezing or shortness of breath., Disp: , Rfl:    albuterol  (VENTOLIN  HFA) 108 (90 Base) MCG/ACT inhaler, Inhale 2 puffs into the lungs every 4 (four) hours as needed for wheezing or shortness of breath., Disp: 1 Inhaler, Rfl: 1   alendronate (FOSAMAX) 70 MG tablet, Take 70 mg by mouth every Monday. Take with a full glass of water on an empty stomach., Disp: ,  Rfl:    budesonide  (RHINOCORT  AQUA) 32 MCG/ACT nasal spray, Place into the nose., Disp: , Rfl:    Calcium Carb-Cholecalciferol (CALCIUM 500+D PO), Take 1 tablet by mouth daily., Disp: , Rfl:    celecoxib (CELEBREX) 200 MG capsule, Take 200 mg by mouth daily., Disp: , Rfl:    diphenhydrAMINE  (BENADRYL ) 25 MG tablet, Take 25 mg by mouth every 6 (six) hours as needed (anxiety)., Disp: , Rfl:    feeding supplement, ENSURE ENLIVE, (ENSURE ENLIVE) LIQD, Take 237 mLs by mouth 2 (two) times daily between meals., Disp: 237 mL, Rfl: 12   guaiFENesin  (MUCINEX ) 600 MG 12 hr tablet, Take 1 tablet (600 mg total) by mouth 2 (two) times daily., Disp: 30 tablet, Rfl: 0   mometasone  (ASMANEX ) 220 MCG/INH inhaler, Inhale 1 puff into the lungs 2 (two) times daily., Disp: 1 each, Rfl: 11   Mometasone  Furoate (ASMANEX  HFA) 200 MCG/ACT AERO, Take 200 mcg by mouth 2 (two) times daily., Disp: , Rfl:    montelukast (SINGULAIR) 10 MG tablet, Take 10 mg by mouth daily., Disp: , Rfl:    Multiple Vitamins-Minerals (MULTIVITAMIN WITH MINERALS) tablet, Take 2 tablets by mouth daily., Disp: , Rfl:    pantoprazole  (PROTONIX ) 40 MG tablet, Take 1 tablet (40 mg total) by mouth daily., Disp: 90 tablet, Rfl: 3   predniSONE  (DELTASONE ) 20 MG tablet, 2 tabs po daily x 4 days (Patient not taking: Reported on 02/18/2024), Disp: 8 tablet, Rfl: 0   sodium chloride  HYPERTONIC 3 % nebulizer solution, INHALE 1 VIAL BY VIA NEBULIZER TWICE A DAY, Disp: , Rfl:    Tiotropium Bromide -Olodaterol (STIOLTO RESPIMAT ) 2.5-2.5 MCG/ACT AERS, Inhale into the lungs daily., Disp: , Rfl:    Tiotropium Bromide -Olodaterol 2.5-2.5 MCG/ACT AERS, Inhale 2 puffs into the lungs daily., Disp: 1 each,  Rfl: 11  Current Facility-Administered Medications:    0.9 %  sodium chloride  infusion, 500 mL, Intravenous, Once, Aneita Gwendlyn DASEN, MD [2]  Social History Tobacco Use  Smoking Status Never  Smokeless Tobacco Never

## 2024-03-23 ENCOUNTER — Encounter (HOSPITAL_COMMUNITY)
Admission: RE | Admit: 2024-03-23 | Discharge: 2024-03-23 | Disposition: A | Discharge status: Home / Self Care | Source: Ambulatory Visit | Attending: Internal Medicine | Admitting: Internal Medicine

## 2024-03-23 DIAGNOSIS — J479 Bronchiectasis, uncomplicated: Secondary | ICD-10-CM

## 2024-03-23 NOTE — Progress Notes (Signed)
 Daily Session Note  Patient Details  Name: Patricia Johnston MRN: [id number] Date of Birth: 07-06-1950 Referring Provider:   Conrad Ports Pulmonary Rehab Walk Test from 02/18/2024 in [facility] for Heart, Vascular, & Lung Health  Referring Provider [name]    Encounter Date: 03/23/2024  Check In:  Session Check In - 03/23/24 1343       Check-In   Supervising physician immediately available to respond to emergencies [facility] MD immediately available    Physician(s) Barnie Press, NP    Location [facility]    Staff Present Ronal Levin, RN, BSN;Randi Midge HECKLE, ACSM-CEP, Exercise Physiologist;Casey Claudene Neita Moats, MS, ACSM-CEP, Exercise Physiologist    Virtual Visit No    Medication changes reported     No    Fall or balance concerns reported    No    Tobacco Cessation No Change    Warm-up and Cool-down Performed as group-led instruction    Resistance Training Performed Yes    VAD Patient? No    PAD/SET Patient? No      Pain Assessment   Currently in Pain? No/denies    Multiple Pain Sites No          Capillary Blood Glucose: No results found for this or any previous visit (from the past 24 hours).    Tobacco Use History[1]  Goals Met:  Proper associated with RPD/PD & O2 Sat Exercise tolerated well No report of concerns or symptoms today Strength training completed today  Goals Unmet:  Not Applicable  Comments: Service time is from 1308 to 1449.    Dr. Slater Staff is Medical Director for Pulmonary Rehab at [facility].     [1]  Social History Tobacco Use  Smoking Status Never  Smokeless Tobacco Never

## 2024-03-28 ENCOUNTER — Encounter (HOSPITAL_COMMUNITY)

## 2024-03-28 ENCOUNTER — Telehealth (HOSPITAL_COMMUNITY): Payer: Self-pay | Admitting: *Deleted

## 2024-03-28 NOTE — Telephone Encounter (Signed)
 Pt LVM stating she was unable to make it to Pulmonary Rehab today due to the icy conditions.  Aliene Aris BS, ACSM-CEP 03/28/2024 12:44 PM

## 2024-03-30 ENCOUNTER — Telehealth (HOSPITAL_COMMUNITY): Payer: Self-pay

## 2024-03-30 ENCOUNTER — Encounter (HOSPITAL_COMMUNITY)

## 2024-03-30 NOTE — Telephone Encounter (Signed)
 Patient c/o for 1:15 PR class, states [facility] is [facility] later than expected.

## 2024-03-30 NOTE — Telephone Encounter (Signed)
 Called patient to let him know that elevator is out of Order.  Advised to use the top level of parking deck that will put them on 2nd level and use stairway to 3rd floor.

## 2024-04-04 ENCOUNTER — Encounter (HOSPITAL_COMMUNITY)
Admission: RE | Admit: 2024-04-04 | Discharge: 2024-04-04 | Disposition: A | Discharge status: Home / Self Care | Source: Ambulatory Visit | Attending: Pulmonary Disease | Admitting: Pulmonary Disease

## 2024-04-04 VITALS — Wt 128.5 lb

## 2024-04-04 DIAGNOSIS — J479 Bronchiectasis, uncomplicated: Secondary | ICD-10-CM

## 2024-04-04 NOTE — Progress Notes (Signed)
 Daily Session Note  Patient Details  Name: Patricia Johnston MRN: [id number] Date of Birth: Jun 27, 1950 Referring Provider:   Conrad Ports Pulmonary Rehab Walk Test from 02/18/2024 in [facility] for Heart, Vascular, & Lung Health  Referring Provider [name]    Encounter Date: 04/04/2024  Check In:  Session Check In - 04/04/24 1320       Check-In   Supervising physician immediately available to respond to emergencies [facility] MD immediately available    Physician(s) Josefa Beauvais, NP    Location [facility]    Staff Present Ronal Levin, RN, BSN;Randi Midge BS, ACSM-CEP, Exercise Physiologist;Jeiden Daughtridge Claudene Neita Moats, MS, ACSM-CEP, Exercise Physiologist    Virtual Visit No    Medication changes reported     No    Fall or balance concerns reported    No    Tobacco Cessation No Change    Warm-up and Cool-down Performed as group-led instruction    Resistance Training Performed Yes    VAD Patient? No    PAD/SET Patient? No      Pain Assessment   Currently in Pain? No/denies    Multiple Pain Sites No          Capillary Blood Glucose: No results found for this or any previous visit (from the past 24 hours).   Exercise Prescription Changes - 04/04/24 1400       Response to Exercise   Blood Pressure (Admit) 128/60    Blood Pressure (Exercise) 140/72    Blood Pressure (Exit) 100/60    Heart Rate (Admit) 79 bpm    Heart Rate (Exercise) 114 bpm    Heart Rate (Exit) 88 bpm    Oxygen Saturation (Admit) 95 %    Oxygen Saturation (Exercise) 91 %    Oxygen Saturation (Exit) 93 %    Rating of Perceived Exertion (Exercise) 12    Perceived Dyspnea (Exercise) 1    Duration Continue with 30 min of aerobic exercise without signs/symptoms of physical distress.    Intensity THRR unchanged      Progression   Progression Continue to progress workloads to maintain intensity without signs/symptoms of physical distress.      Resistance Training    Weight black bands    Reps 10-15    Time 10 Minutes      Treadmill   MPH 2.6    Grade 2.5    Minutes 15    METs 3.5      Elliptical   Level 1    Speed 1    Minutes 15    METs 5.2      Home Exercise Plan   Plans to continue exercise at Home (comment)   bike and walking   Initial Home Exercises Provided 04/04/24          Tobacco Use History[1]  Goals Met:  Proper associated with RPD/PD & O2 Sat Independence with exercise equipment Exercise tolerated well No report of concerns or symptoms today Strength training completed today  Goals Unmet:  Not Applicable  Comments: Service time is from 1304 to 1426.    Dr. Slater Staff is Medical Director for Pulmonary Rehab at [facility].     [1]  Social History Tobacco Use  Smoking Status Never  Smokeless Tobacco Never

## 2024-04-06 ENCOUNTER — Encounter (HOSPITAL_COMMUNITY)
Admission: RE | Admit: 2024-04-06 | Discharge: 2024-04-06 | Disposition: A | Source: Ambulatory Visit | Attending: Internal Medicine | Admitting: Internal Medicine

## 2024-04-06 DIAGNOSIS — J479 Bronchiectasis, uncomplicated: Secondary | ICD-10-CM

## 2024-04-06 NOTE — Progress Notes (Signed)
 Daily Session Note  Patient Details  Name: Patricia Johnston MRN: [id number] Date of Birth: October 16, 1950 Referring Provider:   Conrad Ports Pulmonary Rehab Walk Test from 02/18/2024 in [facility] for Heart, Vascular, & Lung Health  Referring Provider [name]    Encounter Date: 04/06/2024  Check In:  Session Check In - 04/06/24 1422       Check-In   Supervising physician immediately available to respond to emergencies [facility] MD immediately available    Physician(s) Orren Fabry, NP    Location [facility]    Staff Present Ronal Levin, RN, BSN;Randi Midge BS, ACSM-CEP, Exercise Physiologist;Casey Claudene Neita Moats, MS, ACSM-CEP, Exercise Physiologist    Virtual Visit No    Medication changes reported     No    Fall or balance concerns reported    No    Tobacco Cessation No Change    Warm-up and Cool-down Performed as group-led instruction    Resistance Training Performed Yes    VAD Patient? No    PAD/SET Patient? No      Pain Assessment   Currently in Pain? No/denies    Multiple Pain Sites No          Capillary Blood Glucose: No results found for this or any previous visit (from the past 24 hours).    Tobacco Use History[1]  Goals Met:  Independence with exercise equipment Exercise tolerated well Queuing for purse lip breathing No report of concerns or symptoms today Strength training completed today  Goals Unmet:  Not Applicable  Comments: Service time is from 1305 to 1447    Dr. Slater Staff is Medical Director for Pulmonary Rehab at [facility].     [1]  Social History Tobacco Use  Smoking Status Never  Smokeless Tobacco Never

## 2024-04-11 ENCOUNTER — Encounter (HOSPITAL_COMMUNITY)

## 2024-04-13 ENCOUNTER — Encounter (HOSPITAL_COMMUNITY)

## 2024-04-18 ENCOUNTER — Encounter (HOSPITAL_COMMUNITY)

## 2024-04-20 ENCOUNTER — Encounter (HOSPITAL_COMMUNITY)

## 2024-04-25 ENCOUNTER — Encounter (HOSPITAL_COMMUNITY)

## 2024-04-27 ENCOUNTER — Encounter (HOSPITAL_COMMUNITY)

## 2024-05-02 ENCOUNTER — Encounter (HOSPITAL_COMMUNITY): Attending: Pulmonary Disease

## 2024-05-04 ENCOUNTER — Encounter (HOSPITAL_COMMUNITY)

## 2024-05-09 ENCOUNTER — Encounter (HOSPITAL_COMMUNITY)

## 2024-05-11 ENCOUNTER — Encounter (HOSPITAL_COMMUNITY)

## 2024-05-12 ENCOUNTER — Ambulatory Visit: Admitting: Diagnostic Neuroimaging

## 2024-05-16 ENCOUNTER — Encounter (HOSPITAL_COMMUNITY)

## 2024-05-18 ENCOUNTER — Encounter (HOSPITAL_COMMUNITY)

## 2024-05-23 ENCOUNTER — Encounter (HOSPITAL_COMMUNITY)
